# Patient Record
Sex: Female | Born: 1960
Health system: Southern US, Community
[De-identification: ages and names within clinical notes are randomized; demographics above are authoritative.]

## PROBLEM LIST (undated history)

## (undated) DIAGNOSIS — I10 Essential (primary) hypertension: Secondary | ICD-10-CM

## (undated) DIAGNOSIS — E039 Hypothyroidism, unspecified: Secondary | ICD-10-CM

## (undated) DIAGNOSIS — E119 Type 2 diabetes mellitus without complications: Secondary | ICD-10-CM

## (undated) DIAGNOSIS — H9190 Unspecified hearing loss, unspecified ear: Secondary | ICD-10-CM

## (undated) DIAGNOSIS — F32A Depression, unspecified: Secondary | ICD-10-CM

## (undated) DIAGNOSIS — F419 Anxiety disorder, unspecified: Secondary | ICD-10-CM

## (undated) DIAGNOSIS — E785 Hyperlipidemia, unspecified: Secondary | ICD-10-CM

## (undated) DIAGNOSIS — F329 Major depressive disorder, single episode, unspecified: Secondary | ICD-10-CM

## (undated) DIAGNOSIS — K3184 Gastroparesis: Secondary | ICD-10-CM

## (undated) HISTORY — DX: Major depressive disorder, single episode, unspecified: F32.9

## (undated) HISTORY — DX: Hyperlipidemia, unspecified: E78.5

## (undated) HISTORY — DX: Depression, unspecified: F32.A

## (undated) HISTORY — PX: ABDOMINAL HYSTERECTOMY: SUR658

## (undated) HISTORY — DX: Essential (primary) hypertension: I10

## (undated) HISTORY — PX: BUNIONECTOMY: SHX129

## (undated) HISTORY — DX: Hypothyroidism, unspecified: E03.9

## (undated) HISTORY — DX: Anxiety disorder, unspecified: F41.9

## (undated) HISTORY — DX: Gastroparesis: K31.84

## (undated) HISTORY — DX: Unspecified hearing loss, unspecified ear: H91.90

## (undated) HISTORY — DX: Type 2 diabetes mellitus without complications: E11.9

---

## 1997-08-23 ENCOUNTER — Encounter: Admission: RE | Admit: 1997-08-23 | Discharge: 1997-11-21 | Payer: Self-pay | Admitting: Internal Medicine

## 2000-02-14 ENCOUNTER — Encounter (HOSPITAL_BASED_OUTPATIENT_CLINIC_OR_DEPARTMENT_OTHER): Payer: Self-pay | Admitting: Internal Medicine

## 2000-02-14 ENCOUNTER — Encounter: Admission: RE | Admit: 2000-02-14 | Discharge: 2000-02-14 | Payer: Self-pay | Admitting: Internal Medicine

## 2001-02-17 ENCOUNTER — Encounter: Payer: Self-pay | Admitting: Internal Medicine

## 2001-02-17 ENCOUNTER — Encounter: Admission: RE | Admit: 2001-02-17 | Discharge: 2001-02-17 | Payer: Self-pay | Admitting: Internal Medicine

## 2001-05-18 ENCOUNTER — Encounter: Payer: Self-pay | Admitting: Internal Medicine

## 2001-05-18 ENCOUNTER — Ambulatory Visit (HOSPITAL_COMMUNITY): Admission: RE | Admit: 2001-05-18 | Discharge: 2001-05-18 | Payer: Self-pay | Admitting: Internal Medicine

## 2001-09-24 ENCOUNTER — Encounter: Payer: Self-pay | Admitting: Internal Medicine

## 2001-09-24 ENCOUNTER — Encounter: Admission: RE | Admit: 2001-09-24 | Discharge: 2001-09-24 | Payer: Self-pay | Admitting: Internal Medicine

## 2002-03-03 ENCOUNTER — Encounter: Admission: RE | Admit: 2002-03-03 | Discharge: 2002-03-03 | Payer: Self-pay | Admitting: Internal Medicine

## 2002-03-03 ENCOUNTER — Encounter: Payer: Self-pay | Admitting: Internal Medicine

## 2003-01-02 ENCOUNTER — Inpatient Hospital Stay (HOSPITAL_COMMUNITY): Admission: EM | Admit: 2003-01-02 | Discharge: 2003-01-05 | Payer: Self-pay | Admitting: Psychiatry

## 2003-02-25 ENCOUNTER — Emergency Department (HOSPITAL_COMMUNITY): Admission: EM | Admit: 2003-02-25 | Discharge: 2003-02-26 | Payer: Self-pay | Admitting: Emergency Medicine

## 2003-03-28 ENCOUNTER — Ambulatory Visit (HOSPITAL_COMMUNITY): Admission: RE | Admit: 2003-03-28 | Discharge: 2003-03-28 | Payer: Self-pay | Admitting: Internal Medicine

## 2003-12-30 ENCOUNTER — Encounter: Admission: RE | Admit: 2003-12-30 | Discharge: 2003-12-30 | Payer: Self-pay | Admitting: Internal Medicine

## 2004-03-22 ENCOUNTER — Ambulatory Visit (HOSPITAL_COMMUNITY): Admission: RE | Admit: 2004-03-22 | Discharge: 2004-03-22 | Payer: Self-pay | Admitting: Internal Medicine

## 2005-02-26 ENCOUNTER — Encounter: Admission: RE | Admit: 2005-02-26 | Discharge: 2005-02-26 | Payer: Self-pay | Admitting: Internal Medicine

## 2006-02-28 ENCOUNTER — Encounter: Admission: RE | Admit: 2006-02-28 | Discharge: 2006-02-28 | Payer: Self-pay | Admitting: Internal Medicine

## 2006-08-20 ENCOUNTER — Ambulatory Visit (HOSPITAL_COMMUNITY): Payer: Self-pay | Admitting: Psychiatry

## 2006-10-01 ENCOUNTER — Ambulatory Visit (HOSPITAL_COMMUNITY): Payer: Self-pay | Admitting: Psychiatry

## 2006-12-10 ENCOUNTER — Ambulatory Visit (HOSPITAL_COMMUNITY): Payer: Self-pay | Admitting: Psychiatry

## 2007-01-28 ENCOUNTER — Ambulatory Visit (HOSPITAL_COMMUNITY): Payer: Self-pay | Admitting: Psychiatry

## 2007-04-08 ENCOUNTER — Ambulatory Visit (HOSPITAL_COMMUNITY): Payer: Self-pay | Admitting: Psychiatry

## 2007-04-30 ENCOUNTER — Encounter: Admission: RE | Admit: 2007-04-30 | Discharge: 2007-04-30 | Payer: Self-pay | Admitting: Internal Medicine

## 2007-07-22 ENCOUNTER — Ambulatory Visit (HOSPITAL_COMMUNITY): Payer: Self-pay | Admitting: Psychiatry

## 2007-09-16 ENCOUNTER — Ambulatory Visit (HOSPITAL_COMMUNITY): Payer: Self-pay | Admitting: Psychiatry

## 2007-10-21 ENCOUNTER — Ambulatory Visit (HOSPITAL_COMMUNITY): Payer: Self-pay | Admitting: Psychiatry

## 2007-11-18 ENCOUNTER — Ambulatory Visit (HOSPITAL_COMMUNITY): Payer: Self-pay | Admitting: Psychiatry

## 2007-12-16 ENCOUNTER — Ambulatory Visit (HOSPITAL_COMMUNITY): Payer: Self-pay | Admitting: Psychiatry

## 2008-02-10 ENCOUNTER — Ambulatory Visit (HOSPITAL_COMMUNITY): Payer: Self-pay | Admitting: Psychiatry

## 2008-04-06 ENCOUNTER — Ambulatory Visit (HOSPITAL_COMMUNITY): Payer: Self-pay | Admitting: Psychiatry

## 2008-05-18 ENCOUNTER — Ambulatory Visit (HOSPITAL_COMMUNITY): Payer: Self-pay | Admitting: Psychiatry

## 2008-07-06 DIAGNOSIS — E039 Hypothyroidism, unspecified: Secondary | ICD-10-CM | POA: Insufficient documentation

## 2008-07-06 DIAGNOSIS — F321 Major depressive disorder, single episode, moderate: Secondary | ICD-10-CM | POA: Insufficient documentation

## 2008-07-06 DIAGNOSIS — H9190 Unspecified hearing loss, unspecified ear: Secondary | ICD-10-CM | POA: Insufficient documentation

## 2008-07-06 DIAGNOSIS — F200 Paranoid schizophrenia: Secondary | ICD-10-CM | POA: Insufficient documentation

## 2008-07-11 ENCOUNTER — Ambulatory Visit: Payer: Self-pay | Admitting: Gastroenterology

## 2008-07-11 DIAGNOSIS — R195 Other fecal abnormalities: Secondary | ICD-10-CM | POA: Insufficient documentation

## 2008-07-11 LAB — CONVERTED CEMR LAB
Calcium: 9 mg/dL (ref 8.4–10.5)
Eosinophils Absolute: 0.3 10*3/uL (ref 0.0–0.7)
GFR calc Af Amer: 115 mL/min
GFR calc non Af Amer: 95 mL/min
Glucose, Bld: 108 mg/dL — ABNORMAL HIGH (ref 70–99)
HCT: 42.1 % (ref 36.0–46.0)
Hemoglobin: 14.9 g/dL (ref 12.0–15.0)
MCHC: 35.4 g/dL (ref 30.0–36.0)
MCV: 88.4 fL (ref 78.0–100.0)
Monocytes Absolute: 0.8 10*3/uL (ref 0.1–1.0)
Monocytes Relative: 6.7 % (ref 3.0–12.0)
Neutro Abs: 7.5 10*3/uL (ref 1.4–7.7)
Platelets: 230 10*3/uL (ref 150–400)
RDW: 11.6 % (ref 11.5–14.6)
Sodium: 141 meq/L (ref 135–145)

## 2008-08-16 ENCOUNTER — Ambulatory Visit: Payer: Self-pay | Admitting: Gastroenterology

## 2008-08-16 DIAGNOSIS — K589 Irritable bowel syndrome without diarrhea: Secondary | ICD-10-CM | POA: Insufficient documentation

## 2008-08-16 DIAGNOSIS — K644 Residual hemorrhoidal skin tags: Secondary | ICD-10-CM | POA: Insufficient documentation

## 2008-08-23 ENCOUNTER — Ambulatory Visit: Payer: Self-pay | Admitting: Gastroenterology

## 2008-09-04 ENCOUNTER — Encounter: Payer: Self-pay | Admitting: Gastroenterology

## 2008-09-14 ENCOUNTER — Ambulatory Visit (HOSPITAL_COMMUNITY): Payer: Self-pay | Admitting: Psychiatry

## 2008-09-27 ENCOUNTER — Ambulatory Visit: Payer: Self-pay | Admitting: Gastroenterology

## 2008-09-27 DIAGNOSIS — K648 Other hemorrhoids: Secondary | ICD-10-CM | POA: Insufficient documentation

## 2008-10-11 ENCOUNTER — Ambulatory Visit (HOSPITAL_COMMUNITY): Admission: RE | Admit: 2008-10-11 | Discharge: 2008-10-11 | Payer: Self-pay | Admitting: Gastroenterology

## 2008-10-11 ENCOUNTER — Ambulatory Visit: Payer: Self-pay | Admitting: Gastroenterology

## 2008-10-19 ENCOUNTER — Ambulatory Visit (HOSPITAL_COMMUNITY): Payer: Self-pay | Admitting: Psychiatry

## 2008-11-30 ENCOUNTER — Ambulatory Visit (HOSPITAL_COMMUNITY): Payer: Self-pay | Admitting: Psychiatry

## 2009-01-04 ENCOUNTER — Ambulatory Visit (HOSPITAL_COMMUNITY): Payer: Self-pay | Admitting: Psychiatry

## 2009-02-21 ENCOUNTER — Encounter: Admission: RE | Admit: 2009-02-21 | Discharge: 2009-02-21 | Payer: Self-pay | Admitting: Internal Medicine

## 2009-05-17 ENCOUNTER — Ambulatory Visit (HOSPITAL_COMMUNITY): Payer: Self-pay | Admitting: Psychiatry

## 2009-05-31 ENCOUNTER — Ambulatory Visit (HOSPITAL_COMMUNITY): Payer: Self-pay | Admitting: Psychiatry

## 2009-08-02 ENCOUNTER — Ambulatory Visit (HOSPITAL_COMMUNITY): Payer: Self-pay | Admitting: Psychiatry

## 2009-09-06 ENCOUNTER — Ambulatory Visit (HOSPITAL_COMMUNITY): Payer: Self-pay | Admitting: Psychiatry

## 2009-09-27 ENCOUNTER — Ambulatory Visit (HOSPITAL_COMMUNITY): Payer: Self-pay | Admitting: Psychiatry

## 2009-10-23 ENCOUNTER — Ambulatory Visit: Payer: Self-pay | Admitting: Psychiatry

## 2009-11-01 ENCOUNTER — Ambulatory Visit (HOSPITAL_COMMUNITY): Payer: Self-pay | Admitting: Psychiatry

## 2010-02-20 ENCOUNTER — Ambulatory Visit: Payer: Self-pay | Admitting: Gastroenterology

## 2010-02-26 ENCOUNTER — Encounter: Admission: RE | Admit: 2010-02-26 | Discharge: 2010-02-26 | Payer: Self-pay | Admitting: Internal Medicine

## 2010-02-28 ENCOUNTER — Ambulatory Visit: Payer: Self-pay | Admitting: Gastroenterology

## 2010-03-01 ENCOUNTER — Telehealth: Payer: Self-pay | Admitting: Gastroenterology

## 2010-03-14 ENCOUNTER — Ambulatory Visit (HOSPITAL_COMMUNITY): Admission: RE | Admit: 2010-03-14 | Discharge: 2010-03-14 | Payer: Self-pay | Admitting: Gastroenterology

## 2010-03-15 ENCOUNTER — Encounter: Payer: Self-pay | Admitting: Gastroenterology

## 2010-03-15 ENCOUNTER — Telehealth: Payer: Self-pay | Admitting: Gastroenterology

## 2010-04-25 ENCOUNTER — Ambulatory Visit: Payer: Self-pay | Admitting: Gastroenterology

## 2010-04-25 DIAGNOSIS — K3184 Gastroparesis: Secondary | ICD-10-CM | POA: Insufficient documentation

## 2010-05-31 ENCOUNTER — Telehealth: Payer: Self-pay | Admitting: Gastroenterology

## 2010-05-31 ENCOUNTER — Encounter: Payer: Self-pay | Admitting: Gastroenterology

## 2010-06-01 ENCOUNTER — Ambulatory Visit
Admission: RE | Admit: 2010-06-01 | Discharge: 2010-06-01 | Payer: Self-pay | Source: Home / Self Care | Attending: Gastroenterology | Admitting: Gastroenterology

## 2010-06-05 ENCOUNTER — Encounter: Payer: Self-pay | Admitting: Gastroenterology

## 2010-06-06 ENCOUNTER — Telehealth: Payer: Self-pay | Admitting: Gastroenterology

## 2010-06-12 NOTE — Progress Notes (Signed)
Summary: Med sent  Phone Note Outgoing Call Call back at Home Phone [phone]   Action Taken: Appt scheduled Summary of Call: Called pt to inform her of her GES and that we was sending in a new prescription for her. ALso made her follow up appointment with Dr Arlyce Dice on 04/25/2010 at 11am Initial call taken by: Merri Ray CMA Duncan Dull),  March 15, 2010 10:56 AM    New/Updated Medications: REGLAN 10 MG TABS (METOCLOPRAMIDE HCL) 1 po  30 minutes before meals and at bedtime Prescriptions: REGLAN 10 MG TABS (METOCLOPRAMIDE HCL) 1 po  30 minutes before meals and at bedtime  #120 x 1   Entered by:   Merri Ray CMA (AAMA)   Authorized by:   Louis Meckel MD   Signed by:   Merri Ray CMA (AAMA) on 03/15/2010   Method used:   [facility]        [facility]  [address] [phone][facility] ([facility]ail[facility]       [address]       [place], [place]  [address]       Ph: [phone]       Fax: [phone]   RxID:   [id number]

## 2010-06-12 NOTE — Letter (Signed)
Summary: Diabetic Instructions  [facility]  [address] [place], [place] [address]   Phone: [phone]  Fax: [phone]    [facility] [place] 07-10-1960 MRN: [id number]   X    ORAL DIABETIC MEDICATION INSTRUCTIONS  The day before your procedure:   Take your diabetic pill as you do normally  The day of your procedure:   Do not take your diabetic pill    We will check your blood sugar levels during the admission process and again in Recovery before discharging you home  ________________________________________________________________________  _  _   INSULIN (LONG ACTING) MEDICATION INSTRUCTIONS (Lantus, NPH, 70/30, Humulin, Novolin-N)   The day before your procedure:   Take  your regular evening dose    The day of your procedure:   Do not take your morning dose    _  _   INSULIN (SHORT ACTING) MEDICATION INSTRUCTIONS (Regular, Humulog, Novolog)   The day before your procedure:   Do not take your evening dose   The day of your procedure:   Do not take your morning dose   _  _   INSULIN PUMP MEDICATION INSTRUCTIONS  We will contact the physician managing your diabetic care for written dosage instructions for the day before your procedure and the day of your procedure.  Once we have received the instructions, we will contact you.

## 2010-06-12 NOTE — Progress Notes (Signed)
Summary: GES SCHEDULED  Phone Note Outgoing Call Call back at [facility] Phone [phone]   Call placed by: Merri Ray CMA Duncan Dull),  March 01, 2010 8:38 AM Action Taken: Appt scheduled Summary of Call: called pt to inform GES was scheduled for 03/14/2010 at 10:00am. Will mail the pt instructions today Initial call taken by: Merri Ray CMA Duncan Dull),  March 01, 2010 8:42 AM

## 2010-06-12 NOTE — Procedures (Signed)
Summary: Upper Endoscopy  Patient: Heidi Good Note: All result statuses are Final unless otherwise noted.  Tests: (1) Upper Endoscopy (EGD)   EGD Upper Endoscopy       DONE     [facility]     [address]     [place], [place]  [address]           ENDOSCOPY PROCEDURE REPORT           PATIENT:  Heidi Good, Heidi Good  MR#:  [id number]     BIRTHDATE:  01-12-1961, 49 yrs. old  GENDER:  female           ENDOSCOPIST:  Barbette Hair. Arlyce Dice, MD     Referred by:           PROCEDURE DATE:  02/28/2010     PROCEDURE:  EGD, diagnostic     ASA CLASS:  Class II     INDICATIONS:  nausea           MEDICATIONS:   Fentanyl 75 mcg IV, Versed 7 mg IV, glycopyrrolate     (Robinal) 0.2 mg IV, 0.6cc simethancone 0.6 cc PO     TOPICAL ANESTHETIC:  Exactacain Spray           DESCRIPTION OF PROCEDURE:   After the risks benefits and     alternatives of the procedure were thoroughly explained, informed     consent was obtained.  The LB GIF-H180 [id number] endoscope was     introduced through the mouth and advanced to the third portion of     the duodenum, without limitations.  The instrument was slowly     withdrawn as the mucosa was fully examined.     <<PROCEDUREIMAGES>>           The upper, middle, and distal third of the esophagus were     carefully inspected and no abnormalities were noted. The z-line     was well seen at the GEJ. The endoscope was pushed into the fundus     which was normal including a retroflexed view. The antrum,gastric     body, first and second part of the duodenum were unremarkable (see     image1, image2, image3, image4, image5, image6, and image7).     Retroflexed views revealed no abnormalities.    The scope was then     withdrawn from the patient and the procedure completed.     COMPLICATIONS:  None           ENDOSCOPIC IMPRESSION:     1) Normal EGD     RECOMMENDATIONS:     1) My office will arrange for you to have a Gastric Emptying     Scan performed. This is a  radiology test that gives an idea of how     well your stomach functions.           REPEAT EXAM:  No           ______________________________     Barbette Hair. Arlyce Dice, MD           CC:  Geoffry Paradise, MD           n.     Rosalie Doctor:   Barbette Hair. Aaryana Betke at 02/28/2010 03:40 PM           [place], [place], [phone]  Note: An exclamation mark (!) indicates a result that was not dispersed into the flowsheet. Document Creation Date: 02/28/2010 3:40 PM _______________________________________________________________________  (1) Order result  status: Final Collection or observation date-time: 02/28/2010 15:36 Requested date-time:  Receipt date-time:  Reported date-time:  Referring Physician:   Ordering Physician: Melvia Heaps [id number]) Specimen Source:  Source: Launa Grill Order Number: [id number] Lab site:   Appended Document: Upper Endoscopy See phone note  Appended Document: Upper Endoscopy GES SCHEDULED FOR 11/2

## 2010-06-12 NOTE — Assessment & Plan Note (Signed)
Summary: vomiting,diarrhea...as.   History of Present Illness Visit Type: Follow-up Visit Primary GI MD: Melvia Heaps MD [facility] Primary Provider: Geoffry Paradise, MD Requesting Provider: Geoffry Paradise, MD Chief Complaint: Patient c/o 2 months diarrhea after eating as well as daily vomiting. She denies any abdominal pain but does have some chest burning. History of Present Illness:   Ms. Burchfield has returned for evaluation of nausea and diarrhea.  For the past 2 months she has been complaining of  frequent nausea that is worse postprandially.  She vomits 1- 2 times a day.  She also has had frequent diarrhea consisting of at least 3-4 bowel movements a day.  This occurs with urgency and she may awaken to move her bowels.  She denies melena or hematochezia.  There have been no medication changes and she has not been on antibiotics.  She is a non-insulin-dependent diabetic.  Colonoscopy in June, 2010 demonstrated hemorrhoids and diverticula.  She is status post band ligation of her hemorrhoids.  Her weight has been stable.   GI Review of Systems    Reports acid reflux, belching, bloating, chest pain, loss of appetite, nausea, and  vomiting.      Denies abdominal pain, dysphagia with liquids, dysphagia with solids, heartburn, vomiting blood, weight loss, and  weight gain.      Reports change in bowel habits and  diarrhea.    Preventive Screening-Counseling & Management  Caffeine-Diet-Exercise     Does Patient Exercise: yes    Current Medications (verified): 1)  Xanax 0.5 Mg Tabs (Alprazolam) .... Take 1 Tablet By Mouth Two Times A Day As Needed 2)  Simvastatin 20 Mg Tabs (Simvastatin) .... Once Daily 3)  Synthroid 75 Mcg Tabs (Levothyroxine Sodium) .... Take 1 Tablet By Mouth Once A Day 4)  Glucophage 500 Mg Tabs (Metformin Hcl) .... Take 3 Tabs Every Morning 5)  Zoloft 50 Mg Tabs (Sertraline Hcl) .... Once Daily 6)  Abilify 15 Mg Tabs (Aripiprazole) .... At Bedtime 7)  Estradiol 1 Mg  Tabs (Estradiol) .... Take 1 Tablet By Mouth Once A Day 8)  Allergy Shot .... Once Weekly  Allergies (verified): No Known Drug Allergies  Past History:  Past Medical History: Current Problems:  DIABETES MELLITUS, TYPE II, HX OF (ICD-V12.2) HYPOTHYROIDISM (ICD-244.9) DEAFNESS (ICD-389.9) DEPRESSION (ICD-311) SCHIZOAFFECTIVE DISORDER (ICD-295.70) Asthma Diarrhea Vomiting  Past Surgical History: Hysterectomy Left foot surgery  Family History: Family History of Heart Disease: MGF, deceased MI, MGM Family History of Diabetes: MGF  Family History of Liver Cancer:Maternal Grandmother Family History of Colon Cancer: Family History of Colon Polyps: Mother  Social History: Occupation: Retired Research officer, 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  Alcohol Use - no Illicit Drug Use - no Patient is deaf Patient gets regular exercise. Does Patient Exercise:  yes  Review of Systems       The patient complains of fatigue, hearing problems, and sleeping problems.  The patient denies allergy/sinus, anemia, anxiety-new, arthritis/joint pain, back pain, blood in urine, breast changes/lumps, change in vision, confusion, cough, coughing up blood, depression-new, fainting, fever, headaches-new, heart murmur, heart rhythm changes, itching, menstrual pain, muscle pains/cramps, night sweats, nosebleeds, pregnancy symptoms, shortness of breath, skin rash, sore throat, swelling of feet/legs, swollen lymph glands, thirst - excessive , urination - excessive , urination changes/pain, urine leakage, vision changes, and voice change.         All other systems were reviewed and were negative   Vital Signs:  Patient profile:   50 year old female Height:  65 inches Weight:      191.50 pounds BMI:     31.98 BSA:     1.94 Pulse rate:   72 / minute Pulse rhythm:   regular BP sitting:   150 / 90  (left arm)  Vitals Entered By: Lamona Curl CMA Duncan Dull) (February 20, 2010 2:32 PM)  Physical  Exam  Additional Exam:  She is well-developed well-nourished female  skin: anicteric HEENT: normocephalic; PEERLA; no nasal or pharyngeal abnormalities neck: supple nodes: no cervical lymphadenopathy chest: clear to ausculatation and percussion heart: no murmurs, gallops, or rubs abd: soft, nontender; BS normoactive; no abdominal masses, tenderness, organomegaly rectal: deferred ext: no cynanosis, clubbing, edema skeletal: no deformities neuro: oriented x 3; no focal abnormalities    Impression & Recommendations:  Problem # 1:  NAUSEA ALONE (ICD-787.02)  Symptoms could be due to ulcer or nonulcer dyspepsia.  Gastroparesis is also a concern.  Recommendations #1 begin AcipHex 20 mg daily #2 upper endoscopy #3 gastric emptying scan if endoscopy is negative  Orders: EGD (EGD)  Problem # 2:  DIARRHEA (ICD-787.91) New-onset diarrhea of 2 months' duration could be related to bacterial overgrowth secondary to diabetes.  IBS is also a possibility although her symptoms are somewhat prolonged and chronic.  Prior enteric infection is less likely as is pseudomembranous colitis  Medications #1 empiric therapy with ampicillin 500 mg q.i.d. for one week  Problem # 3:  INTERNAL HEMORRHOIDS WITHOUT MENTION COMP (ICD-455.0) Assessment: Improved  Problem # 4:  DIABETES MELLITUS, TYPE II, HX OF (ICD-V12.2) Assessment: Comment Only  Patient Instructions: 1)  Copy sent to : Geoffry Paradise, MD 2)  Your EGD is scheduled for 02/28/2010 at 3pm 3)  You will need to bring your father with you to this appointment. 4)  We are giving you Aciphex samples today 5)  The medication list was reviewed and reconciled.  All changed / newly prescribed medications were explained.  A complete medication list was provided to the patient / caregiver. Prescriptions: AMPICILLIN 250 MG CAPS (AMPICILLIN) take one tablet q.i.d.  #30 x 0   Entered and Authorized by:   Louis Meckel MD   Signed by:   Louis Meckel MD on 02/21/2010   Method used:   Electronically to        [facility] (retail)       [address]       [place], [place]  [id number]       Ph: [phone]       Fax: [phone]   RxID:   [id number]

## 2010-06-12 NOTE — Letter (Signed)
Summary: Appt Reminder 2  [facility]  [address] [place], [place] [address]   Phone: [phone]  Fax: [phone]        March 15, 2010 MRN: [id number]    [facility] [place] [address] [place], [place]  [address]    Dear [name],   You have a return appointment with Dr. Arlyce Dice on 04/25/2010 at 11am.  Please remember to bring a complete list of the medicines you are taking, your insurance card and your co-pay.  If you have to cancel or reschedule this appointment, please call before 5:00 pm the evening before to avoid a cancellation fee.  If you have any questions or concerns, please call [phone].    Sincerely,    Merri Ray CMA (AAMA)

## 2010-06-12 NOTE — Letter (Signed)
Summary: EGD Instructions  [facility]  [address] [place], [place] [address]   Phone: [phone]  Fax: [phone]       [facility] [place]    May 14, 1960    MRN: [id number]       Procedure Day /Date:WEDNESDAY 02/28/2010     Arrival Time: 2PM     Procedure Time:3PM     Location of Procedure:                    X  [facility] (4th Floor)   PREPARATION FOR ENDOSCOPY   On 02/28/2010 THE DAY OF THE PROCEDURE:  1.   No solid foods, milk or milk products are allowed after midnight the night before your procedure.  2.   Do not drink anything colored red or purple.  Avoid juices with pulp.  No orange juice.  3.  You may drink clear liquids until1PM, which is 2 hours before your procedure.                                                                                                CLEAR LIQUIDS INCLUDE: Water Jello Ice Popsicles Tea (sugar ok, no milk/cream) Powdered fruit flavored drinks Coffee (sugar ok, no milk/cream) Gatorade Juice: apple, white grape, white cranberry  Lemonade Clear bullion, consomm, broth Carbonated beverages (any kind) Strained chicken noodle soup Hard Candy   MEDICATION INSTRUCTIONS  Unless otherwise instructed, you should take regular prescription medications with a small sip of water as early as possible the morning of your procedure.  Diabetic patients - see separate instructions.             OTHER INSTRUCTIONS  You will need a responsible adult at least 49 years of age to accompany you and drive you home.   This person must remain in the waiting room during your procedure.  Wear loose fitting clothing that is easily removed.  Leave jewelry and other valuables at home.  However, you may wish to bring a book to read or an iPod/MP3 player to listen to music as you wait for your procedure to start.  Remove all body piercing jewelry and leave at home.  Total time from sign-in until discharge is  approximately 2-3 hours.  You should go home directly after your procedure and rest.  You can resume normal activities the day after your procedure.  The day of your procedure you should not:   Drive   Make legal decisions   Operate machinery   Drink alcohol   Return to work  You will receive specific instructions about eating, activities and medications before you leave.    The above instructions have been reviewed and explained to me by   _______________________    I fully understand and can verbalize these instructions _____________________________ Date _________

## 2010-06-14 NOTE — Miscellaneous (Signed)
  Clinical Lists Changes Since pt is taking antibiotics, would not give ampicillin. Check stool for C dificile toxin  Appended Document:  Spoke with patients father and let him know we did not want her to take another antibiotic since she is already on one for a sinus infection. Let him know to bring her in tomorrow to the lab for c diff toxin test on her stool. Instructed him that they will give them instructions on how to collect the sample and what to do with it. He verbalized understanding.

## 2010-06-14 NOTE — Assessment & Plan Note (Signed)
Summary: F/U FROM GES,STARTING REGLAN-RS   History of Present Illness Visit Type: Follow-up Visit Primary GI MD: Melvia Heaps MD [facility] Primary Provider: Geoffry Paradise, MD Requesting Provider: n/a Chief Complaint: F/U from GES and starting reglan, pt has been taking her mothers carafate and Amitiza which has really helped her. Wants samples and prescription of each medication History of Present Illness:   Heidi Good has returned for followup of her bloating, nausea and diarrhea.  Diarrhea has entirely subsided following therapy with ampicillin.  She is now complaining of constipation and has taken her mother's amitiza with significant improvement.  Upper endoscopy was normal.  Gastric emptying scan demonstrated gastroparesis and she was started on Reglan with excellent results.  Upper GI complaints have resolved.   GI Review of Systems      Denies abdominal pain, acid reflux, belching, bloating, chest pain, dysphagia with liquids, dysphagia with solids, heartburn, loss of appetite, nausea, vomiting, vomiting blood, weight loss, and  weight gain.        Denies anal fissure, black tarry stools, change in bowel habit, constipation, diarrhea, diverticulosis, fecal incontinence, heme positive stool, hemorrhoids, irritable bowel syndrome, jaundice, light color stool, liver problems, rectal bleeding, and  rectal pain.    Current Medications (verified): 1)  Xanax 0.5 Mg Tabs (Alprazolam) .... Take 1 Tablet By Mouth Two Times A Day As Needed 2)  Simvastatin 20 Mg Tabs (Simvastatin) .... Once Daily 3)  Synthroid 75 Mcg Tabs (Levothyroxine Sodium) .... Take 1 Tablet By Mouth Once A Day 4)  Abilify 15 Mg Tabs (Aripiprazole) .... At Bedtime 5)  Estradiol 1 Mg Tabs (Estradiol) .... Take 1 Tablet By Mouth Once A Day 6)  Allergy Shot .... Once Weekly 7)  Ampicillin 250 Mg Caps (Ampicillin) .... Take One Tablet Q.i.d. 8)  Reglan 10 Mg Tabs (Metoclopramide Hcl) .Marland Kitchen.. 1 Po  30 Minutes Before Meals and At  Bedtime 9)  Carafate 1 Gm Tabs (Sucralfate) .... 2 Tsp By Mouth Two Times A Day As Needed 10)  Amitiza 24 Mcg Caps (Lubiprostone) .Marland Kitchen.. 1-2 By Mouth Once Daily  Allergies (verified): No Known Drug Allergies  Past History:  Past Medical History: Last updated: 02/20/2010 Current Problems:  DIABETES MELLITUS, TYPE II, HX OF (ICD-V12.2) HYPOTHYROIDISM (ICD-244.9) DEAFNESS (ICD-389.9) DEPRESSION (ICD-311) SCHIZOAFFECTIVE DISORDER (ICD-295.70) Asthma Diarrhea Vomiting  Past Surgical History: Last updated: 02/20/2010 Hysterectomy Left foot surgery  Family History: Last updated: 02/20/2010 Family History of Heart Disease: MGF, deceased MI, MGM Family History of Diabetes: MGF  Family History of Liver Cancer:Maternal Grandmother Family History of Colon Cancer: Family History of Colon Polyps: Mother  Social History: Last updated: 02/20/2010 Occupation: Retired Research officer, 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  Alcohol Use - no Illicit Drug Use - no Patient is deaf Patient gets regular exercise.  Review of Systems  The patient denies allergy/sinus, anemia, anxiety-new, arthritis/joint pain, back pain, blood in urine, breast changes/lumps, change in vision, confusion, cough, coughing up blood, depression-new, fainting, fatigue, fever, headaches-new, hearing problems, heart murmur, heart rhythm changes, itching, menstrual pain, muscle pains/cramps, night sweats, nosebleeds, pregnancy symptoms, shortness of breath, skin rash, sleeping problems, sore throat, swelling of feet/legs, swollen lymph glands, thirst - excessive , urination - excessive , urination changes/pain, urine leakage, vision changes, and voice change.    Vital Signs:  Patient profile:   50 year old female Height:      65 inches Weight:      197 pounds BMI:     32.90 BSA:     1.97  Pulse rate:   76 / minute Pulse rhythm:   regular BP sitting:   100 / 70  (left arm)  Vitals Entered By: Merri Ray CMA Duncan Dull)  (April 25, 2010 11:10 AM)   Impression & Recommendations:  Problem # 1:  GASTROPARESIS (ICD-536.3) Plan to continue reglan for about 3 months and then reevaluate.  Problem # 2:  DIARRHEA (ICD-787.91) Assessment: Improved This is probably due to bacterial overgrowth.  Recommendations #1 I carefully advised the patient to discontinue amitiza if she begins to to develop diarrhea again.  At that time I will consider repeat antibiotic therapy  Problem # 3:  DIABETES MELLITUS, TYPE II, HX OF (ICD-V12.2) Assessment: Comment Only  Patient Instructions: 1)  Copy sent to : Geoffry Paradise, MD 2)  3 month follow up 3)  The medication list was reviewed and reconciled.  All changed / newly prescribed medications were explained.  A complete medication list was provided to the patient / caregiver.

## 2010-06-14 NOTE — Progress Notes (Signed)
Summary: Triage  Phone Note Call from Patient Call back at Home Phone [phone]   Caller: Patient Call For: Dr. Arlyce Dice Reason for Call: Talk to Nurse Summary of Call: Pt has had diarrhea for past 3 days, would like to speak with nurse Initial call taken by: [place] Johnson,  May 31, 2010 11:03 AM  Follow-up for Phone Call        Spoke with patients mother and she states the patient has had diarrhea for the past 3 days. She is taking Reglan. Is not currently taking Amitiza. Dr. Arlyce Dice please advise. Follow-up by: Selinda Michaels RN,  May 31, 2010 11:50 AM  Additional Follow-up for Phone Call Additional follow up Details #1::        needs ampicillin 250mg  qid x 7 days c/b by Mon if not better Additional Follow-up by: Louis Meckel MD,  May 31, 2010 1:50 PM    Additional Follow-up for Phone Call Additional follow up Details #2::    Spoke with patients mother and let her know to start the Ampicillin and to call us back on Monday if patient no better. Follow-up by: Selinda Michaels RN,  May 31, 2010 2:25 PM  New/Updated Medications: AMPICILLIN 250 MG CAPS (AMPICILLIN) Take 1 by mouth four times a day Prescriptions: AMPICILLIN 250 MG CAPS (AMPICILLIN) Take 1 by mouth four times a day  #28 x 0   Entered by:   Selinda Michaels RN   Authorized by:   Louis Meckel MD   Signed by:   Selinda Michaels RN on 05/31/2010   Method used:   [facility]  [address] [phone][facility] ([facility]ail[facility]       [address]       [place], [place]  [address]       Ph: [phone]       Fax: [phone]   RxID:   [id number]   Appended Document: Triage Patients mother called back and states that she forgot to tell us that her daughter is taking Cefdinir 300mg  two times a day. She started taking this on Monday. Has had diarrhea now for 3 days. Informed patients mother that the Cefdinir may be causing the diarrhea. Dr. Arlyce Dice please advise.

## 2010-06-14 NOTE — Progress Notes (Signed)
Summary: results  Phone Note Call from Patient Call back at Home Phone [phone]   Caller: Heidi Good Call For: Dr Reason for Call: Talk to Nurse Summary of Call: mmother would like daughters lab results Initial call taken by: Tawni Levy,  June 06, 2010 2:37 PM  Follow-up for Phone Call        Spoke with patients mother and let her know lab results. Mother states that patient is still having diarrhea. She is still taking the antibiotic for her sinus infection and mother thinks she has about 5 more days on this. Mother wanted Dr. Arlyce Dice to be aware. Follow-up by: Selinda Michaels RN,  June 06, 2010 2:50 PM  Additional Follow-up for Phone Call Additional follow up Details #1::        Begin florostor 1 tab once daily for 10 days Additional Follow-up by: Louis Meckel MD,  June 06, 2010 3:19 PM    Additional Follow-up for Phone Call Additional follow up Details #2::    Mother aware of Dr. Marzetta Board recommendations. Follow-up by: Selinda Michaels RN,  June 06, 2010 4:26 PM  New/Updated Medications: * FLOROSTOR Take one tablet daily for 10 days Prescriptions: FLOROSTOR Take one tablet daily for 10 days  #10 x 0   Entered by:   Selinda Michaels RN   Authorized by:   Louis Meckel MD   Signed by:   Selinda Michaels RN on 06/06/2010   Method used:   Telephoned to ...       [facility]  [address] [id number]       [address]       [place], [place]  [address]       Ph: [phone]       Fax: [phone]   RxID:   [id number]

## 2010-07-25 LAB — GLUCOSE, CAPILLARY: Glucose-Capillary: 95 mg/dL (ref 70–99)

## 2010-07-27 ENCOUNTER — Encounter: Payer: Self-pay | Admitting: Gastroenterology

## 2010-07-31 NOTE — Letter (Signed)
Summary: Office Visit Letter  [facility]  [address] [place], [place] [address]   Phone: [phone]  Fax: [phone]      July 27, 2010 MRN: [id number]   [facility] [place] [address] [place], [place]  [address]   Dear Ms. Orser,   According to our records, it is time for you to schedule a follow-up office visit with [place].   At your convenience, please call [phone] (option #2)to schedule an office visit. If you have any questions, concerns, or feel that this letter is in error, we would appreciate your call.   Sincerely,  Barbette Hair. Arlyce Dice, M.D.   [facility] [phone]

## 2010-09-14 ENCOUNTER — Ambulatory Visit: Payer: Self-pay | Admitting: Gastroenterology

## 2010-09-24 ENCOUNTER — Ambulatory Visit (INDEPENDENT_AMBULATORY_CARE_PROVIDER_SITE_OTHER): Payer: Medicare Other | Admitting: Gastroenterology

## 2010-09-24 ENCOUNTER — Encounter: Payer: Self-pay | Admitting: Gastroenterology

## 2010-09-24 DIAGNOSIS — K5900 Constipation, unspecified: Secondary | ICD-10-CM

## 2010-09-24 DIAGNOSIS — K3184 Gastroparesis: Secondary | ICD-10-CM

## 2010-09-24 NOTE — Assessment & Plan Note (Addendum)
Symptoms of bloating and nausea seem to be more tied to her constipation than to gastroparesis.  Recommendations #1 discontinue Reglan

## 2010-09-24 NOTE — Progress Notes (Signed)
Ms. Overdorf has returned for followup of her gastroparesis and constipation. She develops nausea and crampy abdominal pain if she goes 4-5 days without a bowel movement. When she takes [place] she readily clears out of her system.  Without medications she may go 4-5 days without a bowel movement. She continues on Reglan.

## 2010-09-24 NOTE — Patient Instructions (Signed)
Cc. Heidi Good We are giving you samples of Amitiza today

## 2010-09-24 NOTE — Assessment & Plan Note (Addendum)
She has functional constipation that responds well to [place].  Plan to continue with the same.

## 2010-09-28 NOTE — Discharge Summary (Signed)
Heidi Good, Heidi Good                         ACCOUNT NO.:  [id number]   MEDICAL RECORD NO.:  [id number]                   PATIENT TYPE:  IPS   LOCATION:  0302                                 FACILITY:  [facility]   PHYSICIAN:  Geoffery Lyons, M.D.                   DATE OF BIRTH:  1960/10/12   DATE OF ADMISSION:  01/02/2003  DATE OF DISCHARGE:  01/05/2003                                 DISCHARGE SUMMARY   CHIEF COMPLAINT AND PRESENT ILLNESS:  This was the first admission to [facility] but one of previous hospitalizations to psychiatric  institution for this 50 year old divorced white female, brought by her  family.  Increased paranoia, hostility and mood liability.  Stopped her  Abilify and symptoms increased.  Denies every having stopped taking the  Abilify.  Acknowledges that her mind is racing, feeling more depressed,  lonely and bored.  Had a loss of motivation, overwhelmed, have to push  herself to have to do things, claimed that everyone is out to get her.   PAST PSYCHIATRIC HISTORY:  Initially diagnosed in 1994.  Currently in  outpatient treatment by Elna Breslow.   ALCOHOL/DRUG HISTORY:  There is no history of alcohol or drugs.   PAST MEDICAL HISTORY:  Hysterectomy-induced menopause, hypothyroid and  replace, type 2 diabetes mellitus.  She is deaf.   MENTAL STATUS EXAM:  Well-nourished, well-developed, alert, cooperative  female.  Deaf.  Signs.  Her mood was labile, especially when discussing her  mother's illness.  Thought processes are positive for paranoia, persecutory  ideas, somewhat grandiose.  Concentration and memory appeared to be intact.  Judgment and insight were fair.  Cognition well-preserved.   ADMISSION DIAGNOSES:   AXIS I:  Schizoaffective disorder, depressed.   AXIS II:  No diagnosis.   AXIS III:  1. Deafness.  2. Hypothyroidism.  3. Hypercholesterolemia.  4. Type 2 diabetes.   AXIS IV:  Moderate.   AXIS V:  Global Assessment of  Functioning upon admission 30-35; highest  Global Assessment of Functioning in the last year 70.   LABORATORY DATA:  Blood chemistry within normal limits.  Thyroid profile  within normal limits.   HOSPITAL COURSE:  She was admitted and started intensive individual and  group psychotherapy.  She was placed on the Xanax she was taking 0.5 mg four  times a day, Abilify 15 mg, Zoloft 50 mg per day, Lipitor, Glucophage 500  mg, 3 daily, Synthroid 0.075, Trileptal 150 mg twice a day and 300 mg at  night.  When she was placed back on her medications and she was given  support, she definitely settled down.  We used an interpreter that helped  her to attend groups and participate in individual and group psychotherapy.  She felt that her father had had a lot of changes in behavior towards her.  There were ideas of reference, some distortions.  Claimed side effects from  the Abilify.  Continued to settle down.  Was interacting with patients and  staff.  Still some underlying paranoia but she seemed to be challenging  that.  On January 05, 2003, she was wanting to be discharged.  She was in  full contact with reality.  Definitely mood was improved, affect was bright,  broad.  She denied any active ideas to hurt herself or others.  She felt  like she had to let go of the feelings towards the sister-in-law.  Was  willing to continue outpatient treatment.  On January 05, 2003, she was in  full contact with reality, so we went ahead and discharged to outpatient  follow-up.   DISCHARGE DIAGNOSES:   AXIS I:  Schizoaffective disorder.   AXIS II:  No diagnosis.   AXIS III:  1. Deafness.  2. Hypothyroidism.  3. Non-insulin-dependent diabetes mellitus.  4. Hypercholesterolemia.   AXIS IV:  Moderate.   AXIS V:  Global Assessment of Functioning upon discharge 55-60.   DISCHARGE MEDICATIONS:  1. Xanax 0.5 mg four times a day.  2. Lipitor 10 mg daily.  3. Synthroid 75 mcg daily.  4. Trileptal 150 mg  twice a day and 2 at night.  5. Ambien 10 mg at bedtime for sleep.  6. Glucophage 500 mg, 3 at night.  7. Zoloft 50 mg daily.  8. Abilify 15 mg at bedtime.   FOLLOW UP:  Dr. Elna Breslow.                                               Geoffery Lyons, M.D.    IL/MEDQ  D:  01/26/2003  T:  01/27/2003  Job:  [id number]

## 2010-09-28 NOTE — H&P (Signed)
Heidi Good, Heidi Good                         ACCOUNT NO.:  [id number]   MEDICAL RECORD NO.:  [id number]                   PATIENT TYPE:  IPS   LOCATION:  0302                                 FACILITY:  [facility]   PHYSICIAN:  Jeanice Lim, M.D.              DATE OF BIRTH:  01/06/1961   DATE OF ADMISSION:  01/02/2003  DATE OF DISCHARGE:                         PSYCHIATRIC ADMISSION ASSESSMENT   PATIENT IDENTIFICATION:  This is a voluntary admission.  She is admitted to  Jeanice Lim, M.D.  Identifying information comes from the patient, her  family members, and accompanying records.   HISTORY OF PRESENT ILLNESS:  This 50 year old white divorced female was  brought here by her family.  The family reports increasing paranoia,  hostility, and mood lability.  Family says the patient stopped her Abilify  and symptoms increased.  The patient denies ever having stopped taking the  Abilify.  The patient acknowledges that her mind is racing.  She is feeling  more depressed, lonely, and bored.  She did feel out a Burn's Depression  Scale where she only acknowledged guilt but she says that her family says  she is to blame for everything.  She did acknowledge having loss of  motivation.  She stated that she is overwhelmed and has to push herself hard  to do things.  She readily acknowledges that everyone is out to get her and  says that she can see the future kind of like C.S.I.  She denies any  problems with sleep.  She denies any problems with concentration and memory.   PAST PSYCHIATRIC HISTORY:  The patient was initially diagnosed with  schizophrenia in 1994.  She had three hospitalizations at this facility,  which, at that point in time, was [facility].  She is currently  followed as an outpatient by Heidi Good. Heidi Good, M.D.   SUBSTANCE ABUSE HISTORY:  Alcohol and drug history: She vehemently denies.   PAST MEDICAL HISTORY:  Primary care Heidi Good: Dr. Michail Good.  Medical  problems: She has a hysterectomy-induced menopause and is replaced with  Estradiol.  She is hypothyroid and replaced.  She has type 2 diabetes.  She  has hypercholesterolemia.  She gets a weekly allergy shot.   DRUG ALLERGIES:  There are none.   PHYSICAL EXAMINATION:  GENERAL:  Physical examination revealed a well  developed, well nourished, somewhat obese white female.  There were no other  specific physical findings.  She does acknowledge getting yearly mammograms  and is concerned about a pelvic exam.  It was explained to her that since  she has had a surgical hysterectomy, a pelvic exam is not as important in  her situation, although she should have one every few years.   SOCIAL HISTORY:  She graduated high school in 1983.  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  In 1985, she married.  She  was married for two years.  Her husband was able to speak and he fathered a  child out of wedlock and hence, they divorced.   FAMILY HISTORY:  Unknown.   MENTAL STATUS EXAM:  Her mental status exam reveals that she is alert and  oriented x 3.  She is well groomed.  She is cooperative.  She is deaf; she  does not speak, she signs.  Her mood was labile, especially when discussing  her mother's illness.  Her mother is critically ill and may not live.  Thought processes are positive for paranoia, persecutory ideas, and somewhat  grandiose.  Her concentration and memory appear to be intact.  Her judgment  and insight are fair.  She has at least average intelligence.  She may have  higher intelligence.   ADMISSION DIAGNOSES:   AXIS I:  1. Schizophrenia, paranoid.  2. Rule out schizoaffective disorder, bipolar, currently depressed.   AXIS II:  Deferred.   AXIS III:  1. Deafness.  2. Hypothyroidism.  3. Status post surgical menopause.  4. Hypercholesterolemia.  5. Type 2 diabetes.  6. Allergies.   AXIS IV:  Mother is critically ill.  She is also having  severe relationship  issues with her father and sister-in-law.  She has medical problems.   AXIS V:   INITIAL PLAN OF CARE:  The plan is to admit for stabilization, to adjust her  medications as indicated.  We will check her TSH.  We will check why Permax  (pergolide) has been prescribed.  We will be holding that for now as it may  cause hallucinations.  We will start Trileptal to help with labile mood.  The interpreters were contacted and they will be coming to help [place] with  [facility] care.   ESTIMATED LENGTH OF STAY:  Tentative length of stay is probably five to  seven days.     Vic Ripper, P.A.-C.               Jeanice Lim, M.D.    MD/MEDQ  D:  01/02/2003  T:  01/03/2003  Job:  [id number]

## 2011-01-18 ENCOUNTER — Other Ambulatory Visit: Payer: Self-pay | Admitting: Internal Medicine

## 2011-01-18 DIAGNOSIS — Z1231 Encounter for screening mammogram for malignant neoplasm of breast: Secondary | ICD-10-CM

## 2011-03-01 ENCOUNTER — Ambulatory Visit
Admission: RE | Admit: 2011-03-01 | Discharge: 2011-03-01 | Disposition: A | Discharge status: Home / Self Care | Payer: Medicare Other | Source: Ambulatory Visit | Attending: Internal Medicine | Admitting: Internal Medicine

## 2011-03-01 DIAGNOSIS — Z1231 Encounter for screening mammogram for malignant neoplasm of breast: Secondary | ICD-10-CM

## 2011-05-15 DIAGNOSIS — J309 Allergic rhinitis, unspecified: Secondary | ICD-10-CM | POA: Diagnosis not present

## 2011-05-20 DIAGNOSIS — F259 Schizoaffective disorder, unspecified: Secondary | ICD-10-CM | POA: Diagnosis not present

## 2011-05-20 DIAGNOSIS — J309 Allergic rhinitis, unspecified: Secondary | ICD-10-CM | POA: Diagnosis not present

## 2011-05-27 DIAGNOSIS — J309 Allergic rhinitis, unspecified: Secondary | ICD-10-CM | POA: Diagnosis not present

## 2011-06-11 DIAGNOSIS — J309 Allergic rhinitis, unspecified: Secondary | ICD-10-CM | POA: Diagnosis not present

## 2011-06-26 DIAGNOSIS — F259 Schizoaffective disorder, unspecified: Secondary | ICD-10-CM | POA: Diagnosis not present

## 2011-07-15 DIAGNOSIS — J309 Allergic rhinitis, unspecified: Secondary | ICD-10-CM | POA: Diagnosis not present

## 2011-07-26 DIAGNOSIS — J309 Allergic rhinitis, unspecified: Secondary | ICD-10-CM | POA: Diagnosis not present

## 2011-07-26 DIAGNOSIS — F259 Schizoaffective disorder, unspecified: Secondary | ICD-10-CM | POA: Diagnosis not present

## 2011-08-14 DIAGNOSIS — J309 Allergic rhinitis, unspecified: Secondary | ICD-10-CM | POA: Diagnosis not present

## 2011-08-19 DIAGNOSIS — J309 Allergic rhinitis, unspecified: Secondary | ICD-10-CM | POA: Diagnosis not present

## 2011-08-23 ENCOUNTER — Encounter: Payer: Self-pay | Admitting: Internal Medicine

## 2011-08-23 ENCOUNTER — Ambulatory Visit (INDEPENDENT_AMBULATORY_CARE_PROVIDER_SITE_OTHER): Payer: Medicare Other | Admitting: Internal Medicine

## 2011-08-23 VITALS — BP 106/72 | HR 86 | Ht 65.0 in | Wt 204.8 lb

## 2011-08-23 DIAGNOSIS — J309 Allergic rhinitis, unspecified: Secondary | ICD-10-CM

## 2011-08-23 DIAGNOSIS — G4733 Obstructive sleep apnea (adult) (pediatric): Secondary | ICD-10-CM | POA: Diagnosis not present

## 2011-08-23 DIAGNOSIS — J45909 Unspecified asthma, uncomplicated: Secondary | ICD-10-CM | POA: Diagnosis not present

## 2011-08-23 MED ORDER — ALBUTEROL SULFATE HFA 108 (90 BASE) MCG/ACT IN AERS: 2.0000 | INHALATION_SPRAY | Freq: Four times a day (QID) | RESPIRATORY_TRACT | Status: DC | PRN

## 2011-08-23 NOTE — Patient Instructions (Signed)
Script for rescue albuterol inhaler sent to drug store  Order- [facility] Sleep study    Dx OSA  Use up the allergy vaccine at Dr Blossom Hoops office, since you have paid for it.  When that is done, let's stop your allergy shots at least for awhile, and see how you do without them. We can retest you and restart your shots later if needed. We can talk about this again next time I see you.

## 2011-08-23 NOTE — Progress Notes (Signed)
51 yoFnever smoker, self-referred for help with snoring. I had seen her for asthma as a teenager.         Mother here Deaf since meningitis as an infant, but able to hear snoring which wakes her. Wakes frequently. No sleep walking or movement disorder, but gets up to bathroom or to eat repeatedly. Then tired next day and naps. Mother has OSA. Chronic allergic rhinitis and asthma. Has been on allergy shots from Dr Corinda Gubler since age 51. Needs rescue inhaler. Bedtime around 9:30 PM but has difficulty initiating and maintaining sleep. Easily disturbed. May try to sleep as late as 10:30 in the morning. Cough and throat tickle blamed on allergic rhinitis with postnasal drip. Takes Nasalcrom. No ENT surgery.  Prior to Admission medications   Medication Sig Start Date End Date Taking? Authorizing Provider  ALPRAZolam Prudy Feeler) 0.5 MG tablet Take 0.5 mg by mouth 2 (two) times daily as needed.    Yes Historical Provider, MD  AMBULATORY NON FORMULARY MEDICATION 1 vial once a week. Medication Name: allergy injections.    Yes Historical Provider, MD  ARIPiprazole (ABILIFY) 15 MG tablet Take 15 mg by mouth daily.     Yes Historical Provider, MD  citalopram (CELEXA) 20 MG tablet Take 1 tablet by mouth daily.   Yes Historical Provider, MD  estradiol (ESTRACE) 1 MG tablet Take 1 mg by mouth daily.     Yes Historical Provider, MD  lamoTRIgine (LAMICTAL) 25 MG tablet Take 1 tablet by mouth daily.   Yes Historical Provider, MD  levothyroxine (SYNTHROID) 75 MCG tablet Take 75 mcg by mouth daily.     Yes Historical Provider, MD  lubiprostone (AMITIZA) 24 MCG capsule Take 24 mcg by mouth 2 (two) times daily as needed.    Yes Historical Provider, MD  meclizine (ANTIVERT) 25 MG tablet as needed.   Yes Historical Provider, MD  simvastatin (ZOCOR) 20 MG tablet Take 20 mg by mouth at bedtime.     Yes Historical Provider, MD   Past Medical History  Diagnosis Date  . Constipation   . Gastroparesis   . Hypothyroidism   .  Type II or unspecified type diabetes mellitus without mention of complication, not stated as uncontrolled   . Anxiety   . Hyperlipemia   . Depression   . Deafness    Past Surgical History  Procedure Date  . Abdominal hysterectomy    History   Social History  . Marital Status: Divorced    Spouse Name: N/A    Number of Children: 0  . Years of Education: N/A   Occupational History  . disablied    Social History Main Topics  . Smoking status: Never Smoker   . Smokeless tobacco: Never Used  . Alcohol Use: No  . Drug Use: No  . Sexually Active: Not on file   Other Topics Concern  . Not on file   Social History Narrative  . No narrative on file   Family History  Problem Relation Age of Onset  . Diabetes Mother   . Heart disease Father   . Colon cancer Neg Hx   . Cancer Mother     Benign  . Liver cancer Maternal Grandmother   . Heart attack Maternal Grandfather   . Emphysema Paternal Grandfather   . Multiple myeloma Father   . Emphysema Father   . Heart attack Father      ROS-see HPI Constitutional:   No-   weight loss, night sweats, fevers, chills,+ fatigue, lassitude.  HEENT:   No-  headaches, difficulty swallowing, tooth/dental problems, sore throat,      + sneezing, itching, ear ache, nasal congestion, post nasal drip,  CV:  No-   chest pain, orthopnea, PND, swelling in lower extremities, anasarca, dizziness, palpitations Resp: No-   shortness of breath with exertion or at rest.              No-   productive cough,  No non-productive cough,  No- coughing up of blood.              No-   change in color of mucus.  No- wheezing.   Skin: No-   rash or lesions. GI:  No-   heartburn, indigestion, abdominal pain, nausea, vomiting, diarrhea,                 change in bowel habits, loss of appetite GU: No-   dysuria, . MS:  No-   joint pain or swelling.   Neuro-     nothing unusual Psych:  No- change in mood or affect. No depression or anxiety.  No memory  loss.  OBJ- Physical Exam General- Alert, Oriented, Affect-appropriate, Distress- none acute, overweight Skin- rash-none, lesions- none, excoriation- none Lymphadenopathy- none Head- atraumatic            Eyes- Gross vision intact, PERRLA, conjunctivae and secretions clear            Ears- Hearing markedly impaired, no hearing aids. TMs look retracted.            Nose- Clear, no-Septal dev, mucus, polyps, erosion, perforation             Throat- Mallampati II-III , mucosa clear , drainage- none, tonsils- atrophic Neck- flexible , trachea midline, no stridor , thyroid nl, carotid no bruit Chest - symmetrical excursion , unlabored           Heart/CV- RRR , no murmur , no gallop  , no rub, nl s1 s2                           - JVD- none , edema- none, stasis changes- none, varices- none           Lung- clear to P&A, wheeze- none, cough- none , dullness-none, rub- none           Chest wall-  Abd- tender-no, distended-no, bowel sounds-present, HSM- no Br/ Gen/ Rectal- Not done, not indicated Extrem- cyanosis- none, clubbing, none, atrophy- none, strength- nl Neuro- grossly intact to observation   "

## 2011-08-26 ENCOUNTER — Telehealth: Payer: Self-pay | Admitting: Internal Medicine

## 2011-08-26 NOTE — Telephone Encounter (Signed)
Spoke with pt's mother and she stated that pt did not do the home study as arranged for Sunday night (08/25/11) due to pt's father having surgery. Mother advised that device would have to be re-programed. Mother stated that she would bring the device back up here on Tues. 08/27/11 and pt would do the study on Tues 08/27/11 evening.

## 2011-08-28 DIAGNOSIS — J302 Other seasonal allergic rhinitis: Secondary | ICD-10-CM | POA: Insufficient documentation

## 2011-08-28 DIAGNOSIS — G4733 Obstructive sleep apnea (adult) (pediatric): Secondary | ICD-10-CM | POA: Insufficient documentation

## 2011-08-28 DIAGNOSIS — J4520 Mild intermittent asthma, uncomplicated: Secondary | ICD-10-CM | POA: Insufficient documentation

## 2011-08-28 DIAGNOSIS — J3089 Other allergic rhinitis: Secondary | ICD-10-CM | POA: Insufficient documentation

## 2011-08-28 NOTE — Assessment & Plan Note (Signed)
Presumptive diagnosis based on history and exam. Plan-we discussed and will schedule a sleep study.

## 2011-08-28 NOTE — Assessment & Plan Note (Signed)
Plan-prescription for albuterol rescue inhaler to have available.

## 2011-08-28 NOTE — Assessment & Plan Note (Signed)
She is interested in stopping allergy vaccine. She will use up and then stop vaccine from her previous office. We will try to manage symptomatically but can reassess as needed.

## 2011-08-30 DIAGNOSIS — F259 Schizoaffective disorder, unspecified: Secondary | ICD-10-CM | POA: Diagnosis not present

## 2011-08-30 DIAGNOSIS — J309 Allergic rhinitis, unspecified: Secondary | ICD-10-CM | POA: Diagnosis not present

## 2011-09-02 ENCOUNTER — Telehealth: Payer: Self-pay | Admitting: Internal Medicine

## 2011-09-02 DIAGNOSIS — E785 Hyperlipidemia, unspecified: Secondary | ICD-10-CM | POA: Diagnosis not present

## 2011-09-02 DIAGNOSIS — G609 Hereditary and idiopathic neuropathy, unspecified: Secondary | ICD-10-CM | POA: Diagnosis not present

## 2011-09-02 DIAGNOSIS — R7301 Impaired fasting glucose: Secondary | ICD-10-CM | POA: Diagnosis not present

## 2011-09-02 DIAGNOSIS — E039 Hypothyroidism, unspecified: Secondary | ICD-10-CM | POA: Diagnosis not present

## 2011-09-02 NOTE — Telephone Encounter (Signed)
Pt's mother is calling for results of home sleep study that was done 08/29/11.  CY, please advise of results.  Thank you!

## 2011-09-02 NOTE — Telephone Encounter (Signed)
I spoke to mother- Heidi Good is deaf. Her home sleep study does demonstrate OSA, 14.1/ hr. [name] would like to try CPAP.

## 2011-09-09 ENCOUNTER — Telehealth: Payer: Self-pay | Admitting: Internal Medicine

## 2011-09-09 DIAGNOSIS — G4733 Obstructive sleep apnea (adult) (pediatric): Secondary | ICD-10-CM

## 2011-09-09 NOTE — Telephone Encounter (Signed)
Called, spoke with pt's mom, Okey Regal.  States CDY called last week and advised he would place order for CPAP and they would receive a call regarding this within 2 days. They have not heard anything yet.  Dr. Maple Hudson, I do not see any orders for cpap.  Pls advise. Thank you.

## 2011-09-09 NOTE — Telephone Encounter (Signed)
Heidi Budge, MD 09/02/2011 5:30 PM Signed  I spoke to mother- [name] is deaf. Her home sleep study does demonstrate OSA, 14.1/ hr. Heidi Good would like to try CPAP.

## 2011-09-09 NOTE — Telephone Encounter (Signed)
Called # provided above - received msg stating "the VM box is unable to receive msgs at this time.  Pls try your call again later.  Goodbye."  [facility]

## 2011-09-11 NOTE — Telephone Encounter (Signed)
CY - please advise on order for CPAP and what settings to use.

## 2011-09-13 NOTE — Telephone Encounter (Signed)
Home Sleep Study has been read- given to [place].  Order DME new CPAP autotitrate for pressure recommendation x 1 week, mask of choice, humidifier, supplies, dx OSA Need f/u appointment with me in 6 weeks or so.

## 2011-09-13 NOTE — Telephone Encounter (Signed)
I spoke with pt and is aware of CDY recs. i have sent order to [facility] and pt has been scheduled to see CDY 10/25/11 at 1:45 for her f/u.

## 2011-09-16 ENCOUNTER — Ambulatory Visit (INDEPENDENT_AMBULATORY_CARE_PROVIDER_SITE_OTHER): Payer: Medicare Other | Admitting: Internal Medicine

## 2011-09-16 DIAGNOSIS — G4733 Obstructive sleep apnea (adult) (pediatric): Secondary | ICD-10-CM | POA: Diagnosis not present

## 2011-09-16 DIAGNOSIS — J309 Allergic rhinitis, unspecified: Secondary | ICD-10-CM | POA: Diagnosis not present

## 2011-09-16 NOTE — Progress Notes (Signed)
51 yoFnever smoker, self-referred for help with snoring. I had seen her for asthma as a teenager.         Mother here Deaf since meningitis as an infant, but able to hear snoring which wakes her. Wakes frequently. No sleep walking or movement disorder, but gets up to bathroom or to eat repeatedly. Then tired next day and naps. Mother has OSA. Chronic allergic rhinitis and asthma. Has been on allergy shots from Dr Corinda Gubler since age 51. Needs rescue inhaler. Bedtime around 9:30 PM but has difficulty initiating and maintaining sleep. Easily disturbed. May try to sleep as late as 10:30 in the morning. Cough and throat tickle blamed on allergic rhinitis with postnasal drip. Takes Nasalcrom. No ENT surgery.    ROS-see HPI Constitutional:   No-   weight loss, night sweats, fevers, chills,+ fatigue, lassitude. HEENT:   No-  headaches, difficulty swallowing, tooth/dental problems, sore throat,      + sneezing, itching, ear ache, nasal congestion, post nasal drip,  CV:  No-   chest pain, orthopnea, PND, swelling in lower extremities, anasarca, dizziness, palpitations Resp: No-   shortness of breath with exertion or at rest.              No-   productive cough,  No non-productive cough,  No- coughing up of blood.              No-   change in color of mucus.  No- wheezing.   Skin: No-   rash or lesions. GI:  No-   heartburn, indigestion, abdominal pain, nausea, vomiting, diarrhea,                 change in bowel habits, loss of appetite GU: No-   dysuria, . MS:  No-   joint pain or swelling.   Neuro-     nothing unusual Psych:  No- change in mood or affect. No depression or anxiety.  No memory loss.  OBJ- Physical Exam General- Alert, Oriented, Affect-appropriate, Distress- none acute, overweight Skin- rash-none, lesions- none, excoriation- none Lymphadenopathy- none Head- atraumatic            Eyes- Gross vision intact, PERRLA, conjunctivae and secretions clear            Ears- Hearing markedly  impaired, no hearing aids. TMs look retracted.            Nose- Clear, no-Septal dev, mucus, polyps, erosion, perforation             Throat- Mallampati II-III , mucosa clear , drainage- none, tonsils- atrophic Neck- flexible , trachea midline, no stridor , thyroid nl, carotid no bruit Chest - symmetrical excursion , unlabored           Heart/CV- RRR , no murmur , no gallop  , no rub, nl s1 s2                           - JVD- none , edema- none, stasis changes- none, varices- none           Lung- clear to P&A, wheeze- none, cough- none , dullness-none, rub- none           Chest wall-  Abd- tender-no, distended-no, bowel sounds-present, HSM- no Br/ Gen/ Rectal- Not done, not indicated Extrem- cyanosis- none, clubbing, none, atrophy- none, strength- nl Neuro- grossly intact to observation   "

## 2011-09-19 DIAGNOSIS — J309 Allergic rhinitis, unspecified: Secondary | ICD-10-CM | POA: Diagnosis not present

## 2011-10-11 ENCOUNTER — Ambulatory Visit (INDEPENDENT_AMBULATORY_CARE_PROVIDER_SITE_OTHER): Payer: Medicare Other | Admitting: Internal Medicine

## 2011-10-11 ENCOUNTER — Encounter: Payer: Self-pay | Admitting: Internal Medicine

## 2011-10-11 VITALS — BP 128/76 | HR 59 | Ht 65.0 in | Wt 205.8 lb

## 2011-10-11 DIAGNOSIS — G4733 Obstructive sleep apnea (adult) (pediatric): Secondary | ICD-10-CM | POA: Diagnosis not present

## 2011-10-11 DIAGNOSIS — J45909 Unspecified asthma, uncomplicated: Secondary | ICD-10-CM

## 2011-10-11 DIAGNOSIS — J309 Allergic rhinitis, unspecified: Secondary | ICD-10-CM

## 2011-10-11 MED ORDER — ALBUTEROL SULFATE (2.5 MG/3ML) 0.083% IN NEBU: 2.5000 mg | INHALATION_SOLUTION | Freq: Four times a day (QID) | RESPIRATORY_TRACT | Status: DC | PRN

## 2011-10-11 MED ORDER — LORATADINE 10 MG PO TABS: 10.0000 mg | ORAL_TABLET | Freq: Every day | ORAL | Status: DC

## 2011-10-11 NOTE — Progress Notes (Signed)
51 yoFnever smoker, self-referred for help with snoring. I had seen her for asthma as a teenager.         Mother here Deaf since meningitis as an infant, but able to hear snoring which wakes her. Wakes frequently. No sleep walking or movement disorder, but gets up to bathroom or to eat repeatedly. Then tired next day and naps. Mother has OSA. Chronic allergic rhinitis and asthma. Has been on allergy shots from Dr Corinda Gubler since age 51. Needs rescue inhaler. Bedtime around 9:30 PM but has difficulty initiating and maintaining sleep. Easily disturbed. May try to sleep as late as 10:30 in the morning. Cough and throat tickle blamed on allergic rhinitis with postnasal drip. Takes Nasalcrom. No ENT surgery.  10/11/11- 51 yoF never smoker, deaf,  self-referred for help with snoring. I had seen her for allergic asthma as a teenager.          Mother here. She had been on allergy shots for many years and finally stopped last week. They describe coughing fits. Over-the-counter cough syrups did not help. Eyes itch. Some sneezing. Occasional use of rescue inhaler.  OSA-unattended home sleep study on 08/27/2011 confirmed mild obstructive sleep apnea, AHI of 14.1 per hour with moderate snoring and oxygen desaturation to 81%. She returns now after CPAP autotitration. Good control and compliance. Titrated to a best pressure range of 10.4. She is tolerating initial CPAP pretty well. Today we discussed alternatives and how to work with the home care company ([facility]).  ROS-see HPI Constitutional:   No-   weight loss, night sweats, fevers, chills,+ fatigue, lassitude. HEENT:   No-  headaches, difficulty swallowing, tooth/dental problems, sore throat, She is deaf- reads lips and signs.     No- sneezing, itching, ear ache, nasal congestion, post nasal drip,  CV:  No-   chest pain, orthopnea, PND, swelling in lower extremities, anasarca, dizziness, palpitations Resp: No-   shortness of breath with exertion or at rest.         No-   productive cough,  No non-productive cough,  No- coughing up of blood.              No-   change in color of mucus.  No- wheezing.   Skin: No-   rash or lesions. GI:  No-   heartburn, indigestion, abdominal pain, nausea, vomiting,  GU:  MS:  No-   joint pain or swelling.   Neuro-     nothing unusual Psych:  No- change in mood or affect. No depression or anxiety.  No memory loss.  OBJ- Physical Exam General- Alert, Oriented, Affect-appropriate, Distress- none acute, overweight Skin- rash-none, lesions- none, excoriation- none Lymphadenopathy- none Head- atraumatic            Eyes- Gross vision intact, PERRLA, conjunctivae and secretions clear            Ears- Hearing markedly impaired, no hearing aids. TMs look retracted.            Nose- Clear, no-Septal dev, mucus, polyps, erosion, perforation             Throat- Mallampati II-III , mucosa clear , drainage- none, tonsils- atrophic Neck- flexible , trachea midline, no stridor , thyroid nl, carotid no bruit Chest - symmetrical excursion , unlabored           Heart/CV- RRR , no murmur , no gallop  , no rub, nl s1 s2                           -  JVD- none , edema- none, stasis changes- none, varices- none           Lung- clear to P&A, wheeze- none, cough- none , dullness-none, rub- none           Chest wall-  Abd-  Br/ Gen/ Rectal- Not done, not indicated Extrem- cyanosis- none, clubbing, none, atrophy- none, strength- nl Neuro- grossly intact to observation   "

## 2011-10-11 NOTE — Patient Instructions (Addendum)
For the itching allergy in your eyes and nose- try an otc antihistamine like Claritin/ loratadine or Allegra/ fexofenadine. These are both very safe. You can take one every day, or just when you think you need it.  Use your rescue albuterol inhaler  2 puffs, up to 4 times daily as needed for chest tightness, wheeze and cough "asthma"  Script for albuterol nebulizer medicine that you can use up to 4 times daily if needed.   You can decide if you want to talk with our [facility] ladies, Bjorn Loser and Almyra Free, about changing DME companies for your CPAP  I will set at 11 based on the download

## 2011-10-14 DIAGNOSIS — F259 Schizoaffective disorder, unspecified: Secondary | ICD-10-CM | POA: Diagnosis not present

## 2011-10-16 NOTE — Assessment & Plan Note (Signed)
Well-controlled, needing occasional rescue inhaler.

## 2011-10-16 NOTE — Assessment & Plan Note (Signed)
She will probably do well with antihistamines. We can add a nasal spray if needed. We are now past most of the spring pollen season.

## 2011-10-25 ENCOUNTER — Ambulatory Visit: Payer: Medicare Other | Admitting: Internal Medicine

## 2011-11-04 ENCOUNTER — Encounter: Payer: Self-pay | Admitting: Internal Medicine

## 2011-11-22 ENCOUNTER — Encounter: Payer: Self-pay | Admitting: Internal Medicine

## 2011-11-22 ENCOUNTER — Ambulatory Visit (INDEPENDENT_AMBULATORY_CARE_PROVIDER_SITE_OTHER): Payer: Medicare Other | Admitting: Internal Medicine

## 2011-11-22 VITALS — BP 124/68 | HR 61 | Ht 65.0 in | Wt 205.0 lb

## 2011-11-22 DIAGNOSIS — J45909 Unspecified asthma, uncomplicated: Secondary | ICD-10-CM

## 2011-11-22 DIAGNOSIS — J4520 Mild intermittent asthma, uncomplicated: Secondary | ICD-10-CM

## 2011-11-22 DIAGNOSIS — G4733 Obstructive sleep apnea (adult) (pediatric): Secondary | ICD-10-CM | POA: Diagnosis not present

## 2011-11-22 NOTE — Progress Notes (Signed)
51 yoFnever smoker, self-referred for help with snoring. I had seen her for asthma as a teenager.         Mother here Deaf since meningitis as an infant, but able to hear snoring which wakes her. Wakes frequently. No sleep walking or movement disorder, but gets up to bathroom or to eat repeatedly. Then tired next day and naps. Mother has OSA. Chronic allergic rhinitis and asthma. Has been on allergy shots from Dr Corinda Gubler since age 51. Needs rescue inhaler. Bedtime around 9:30 PM but has difficulty initiating and maintaining sleep. Easily disturbed. May try to sleep as late as 10:30 in the morning. Cough and throat tickle blamed on allergic rhinitis with postnasal drip. Takes Nasalcrom. No ENT surgery.  10/11/11- 51 yoF never smoker, deaf,  self-referred for help with snoring. I had seen her for allergic asthma as a teenager.          Mother here. She had been on allergy shots for many years and finally stopped last week. They describe coughing fits. Over-the-counter cough syrups did not help. Eyes itch. Some sneezing. Occasional use of rescue inhaler.  OSA-unattended home sleep study on 08/27/2011 confirmed mild obstructive sleep apnea, AHI of 14.1 per hour with moderate snoring and oxygen desaturation to 81%. She returns now after CPAP autotitration. Good control and compliance. Titrated to a best pressure range of 10.4. She is tolerating initial CPAP pretty well. Today we discussed alternatives and how to work with the home care company ([facility]).  11/22/11- 51 yoF never smoker, deaf,  Followed for OSA/ CPAP. I had seen her for allergic asthma as a teenager. Mother here. Wears CPAP every night-still has cool feelings from pressure in machine; lower pressure to 5.0 as it was feeling like too much. CPAP 11/ [facility]. I think she had turned a humidifier or the ramp, thinking she was changing the pressure. Cough is productive of scant clear sputum with no wheezing noted by mother. Has used rescue inhaler a  few times but has not needed her nebulizer machine.  ROS-see HPI Constitutional:   No-   weight loss, night sweats, fevers, chills,+ fatigue, lassitude. HEENT:   No-  headaches, difficulty swallowing, tooth/dental problems, sore throat, She is deaf- reads lips and signs.     No- sneezing, itching, ear ache, nasal congestion, post nasal drip,  CV:  No-   chest pain, orthopnea, PND, swelling in lower extremities, anasarca, dizziness, palpitations Resp: No-   shortness of breath with exertion or at rest.              + productive cough,  No non-productive cough,  No- coughing up of blood.              No-   change in color of mucus.  + wheezing.   Skin: No-   rash or lesions. GI:  No-   heartburn, indigestion, abdominal pain, nausea, vomiting,  GU:  MS:  No-   joint pain or swelling.   Neuro-     nothing unusual Psych:  No- change in mood or affect. No depression or anxiety.  No memory loss.  OBJ- Physical Exam General- Alert, Oriented, Affect-appropriate, Distress- none acute, overweight. Signs and lip-reads. Skin- rash-none, lesions- none, excoriation- none Lymphadenopathy- none Head- atraumatic            Eyes- Gross vision intact, PERRLA, conjunctivae and secretions clear            Ears- Hearing markedly impaired, no hearing aids. TMs  look retracted.            Nose- Clear, no-Septal dev, mucus, polyps, erosion, perforation             Throat- Mallampati II-III , mucosa clear , drainage- none, tonsils- atrophic Neck- flexible , trachea midline, no stridor , thyroid nl, carotid no bruit Chest - symmetrical excursion , unlabored           Heart/CV- RRR , no murmur , no gallop  , no rub, nl s1 s2                           - JVD- none , edema- none, stasis changes- none, varices- none           Lung- clear to P&A, wheeze- none, cough- none , dullness-none, rub- none           Chest wall-  Abd-  Br/ Gen/ Rectal- Not done, not indicated Extrem- cyanosis- none, clubbing, none, atrophy-  none, strength- nl Neuro- grossly intact to observation   "

## 2011-11-22 NOTE — Patient Instructions (Addendum)
Try sample Dulera (blue and white) 100  Maintenance controller inhaler      2 puffs then rinse mouth, twice every day  You can still use your rescue inhaler albuterol HFA- 2 puffs, up to 4 times daily OR the nebulizer machine up to 4 times daily, if [facility] doesn't keep you comfortable.   Continue CPAP  For allergy itching, sneezing, runny nose- try otc antihistamine like Claritin/ loratadine or Allegra/ fexofenadine if needed

## 2011-11-25 DIAGNOSIS — F259 Schizoaffective disorder, unspecified: Secondary | ICD-10-CM | POA: Diagnosis not present

## 2011-11-27 ENCOUNTER — Encounter: Payer: Self-pay | Admitting: Internal Medicine

## 2011-12-02 NOTE — Assessment & Plan Note (Signed)
She may need a maintenance controller. Try sample [facility]

## 2011-12-02 NOTE — Assessment & Plan Note (Signed)
We will leave the pressure at 11

## 2011-12-17 ENCOUNTER — Encounter: Payer: Self-pay | Admitting: Internal Medicine

## 2011-12-25 DIAGNOSIS — F259 Schizoaffective disorder, unspecified: Secondary | ICD-10-CM | POA: Diagnosis not present

## 2012-01-10 DIAGNOSIS — E039 Hypothyroidism, unspecified: Secondary | ICD-10-CM | POA: Diagnosis not present

## 2012-01-10 DIAGNOSIS — R7301 Impaired fasting glucose: Secondary | ICD-10-CM | POA: Diagnosis not present

## 2012-01-10 DIAGNOSIS — G609 Hereditary and idiopathic neuropathy, unspecified: Secondary | ICD-10-CM | POA: Diagnosis not present

## 2012-01-10 DIAGNOSIS — E785 Hyperlipidemia, unspecified: Secondary | ICD-10-CM | POA: Diagnosis not present

## 2012-01-22 DIAGNOSIS — F259 Schizoaffective disorder, unspecified: Secondary | ICD-10-CM | POA: Diagnosis not present

## 2012-01-29 ENCOUNTER — Encounter: Payer: Self-pay | Admitting: Gastroenterology

## 2012-01-31 ENCOUNTER — Encounter: Payer: Self-pay | Admitting: Internal Medicine

## 2012-02-03 ENCOUNTER — Telehealth: Payer: Self-pay | Admitting: Internal Medicine

## 2012-02-03 DIAGNOSIS — G4733 Obstructive sleep apnea (adult) (pediatric): Secondary | ICD-10-CM

## 2012-02-03 NOTE — Telephone Encounter (Signed)
The download indicates she has only been using CPAP for a couple of hours per night, so that's the point. She can't insist she needs it if she doesn't wear it.  Her first study was a home study, because of her hearing problems. We would have to check with her insurance about their willingness to repeat an unattended home study. We also need PCP to verify with DME that she has to get new study.

## 2012-02-03 NOTE — Telephone Encounter (Signed)
Spoke with pt's Mother. She states that [facility] advised her that the pt will need to have another sleep study done in order for her to keep her CPAP machine due to past non compliance. CDY, please advise thanks!

## 2012-02-04 NOTE — Telephone Encounter (Signed)
ATC x1 - no option to LM per automated message.

## 2012-02-05 NOTE — Telephone Encounter (Signed)
Heidi Good called back; will send staff message to her as at the time I did not have all information to give her on patient. Will await a call or message back from [place].

## 2012-02-05 NOTE — Telephone Encounter (Signed)
Spoke with Carol-understands that pt really needs to try and use more every night; mother states that she had [facility] to pick up machine last Friday as she wasn't going to pay $1200 for the machine as they continued to tell her that the patient needs a new sleep study. Okey Regal aware that I am calling [facility] to check on this and will keep her updated.  I have left message for Mayra Reel to call me back about this.

## 2012-02-06 NOTE — Telephone Encounter (Signed)
Per Mayra Reel via staff message:On 09/19 - patient was only 17% compliant with CPAP based off of her current download. Delfin Gant (in our office) called the mother and explained that due to the percentage of the compliance, her insurance will not cover the equipment. She refused to sign an ABN and and requested that we pickup the CPAP. Hope that helps!!

## 2012-02-06 NOTE — Telephone Encounter (Signed)
Heidi Good, can you please assist CY and I in finding out if patient truly needs to have a new sleep study to get CPAP back as she shown non compliance. Thanks.

## 2012-02-06 NOTE — Telephone Encounter (Signed)
CY based on this information are you okay with placing order for new sleep study(had home sleep last time) with the understanding that patient will use CPAP as she is suppose to. Thanks.

## 2012-02-06 NOTE — Telephone Encounter (Signed)
Pt's insurance requires that patient's are 70% compliant every 30 days. So if patient was only 17% compliant, then patient will have to turn her cpap machine back in to DME or private pay for the cpap. Her insurance will not cover her to be on this therapy if she is not going to use it on a regular basis. She will have to have another sleep study to re-qualify her for the cpap. Hope this has answered your question. Rhonda J Cobb

## 2012-02-07 ENCOUNTER — Other Ambulatory Visit: Payer: Self-pay | Admitting: Internal Medicine

## 2012-02-07 DIAGNOSIS — Z1231 Encounter for screening mammogram for malignant neoplasm of breast: Secondary | ICD-10-CM

## 2012-02-12 NOTE — Telephone Encounter (Signed)
Patient will need to have a new sleep study per guidelines with pt's insurance. Heidi Good

## 2012-02-12 NOTE — Telephone Encounter (Signed)
Did we ever clarify with [facility] whether patient really needs new study, vs just autotitrating for presure check??

## 2012-02-17 NOTE — Telephone Encounter (Signed)
See notes from [facility]. If she is going to try again with CPAP, we need a new sleep study to re-qualify her. Because of her deafness/ inability to hear instructions, I would think to repeat her [facility] unattended home study for dx OSA, so we would need to see if her Medicare/ insurance will either authorize a repeat home study, or a sleep center study. If done at center, mother would need to stay overnight to sign/ interpret.

## 2012-02-18 NOTE — Telephone Encounter (Signed)
Called patient's home number. No answer and voice mail has not been set up yet, so I was unable to leave a message. I will call back this afternoon. Heidi Good

## 2012-02-18 NOTE — Telephone Encounter (Signed)
Spoke with pt's mother, Okey Regal, and she stated that whatever study Dr. Maple Hudson thought was best for Heidi Good to do, either home study or sleep study at the sleep lab pt would do either one. Insurance should cover either one. Please place order for study in pcc box. Rhonda J Cobb

## 2012-02-19 NOTE — Telephone Encounter (Signed)
Please order split protocol NPSG at sleep center for dx OSA. Mother will have to accompany and stay with patient, who is deaf.

## 2012-02-19 NOTE — Telephone Encounter (Signed)
Order placed as dictated by CY.  Will close encounter.

## 2012-02-24 ENCOUNTER — Ambulatory Visit (INDEPENDENT_AMBULATORY_CARE_PROVIDER_SITE_OTHER): Payer: Medicare Other | Admitting: Internal Medicine

## 2012-02-24 ENCOUNTER — Encounter: Payer: Self-pay | Admitting: Internal Medicine

## 2012-02-24 VITALS — BP 114/80 | HR 64 | Ht 65.0 in | Wt 197.6 lb

## 2012-02-24 DIAGNOSIS — J309 Allergic rhinitis, unspecified: Secondary | ICD-10-CM | POA: Diagnosis not present

## 2012-02-24 DIAGNOSIS — G4733 Obstructive sleep apnea (adult) (pediatric): Secondary | ICD-10-CM

## 2012-02-24 DIAGNOSIS — Z23 Encounter for immunization: Secondary | ICD-10-CM

## 2012-02-24 MED ORDER — MOMETASONE FURO-FORMOTEROL FUM 100-5 MCG/ACT IN AERO: 2.0000 | INHALATION_SPRAY | Freq: Two times a day (BID) | RESPIRATORY_TRACT | Status: DC

## 2012-02-24 NOTE — Progress Notes (Signed)
51 yoFnever smoker, self-referred for help with snoring. I had seen her for asthma as a teenager.         Mother here Deaf since meningitis as an infant, but able to hear snoring which wakes her. Wakes frequently. No sleep walking or movement disorder, but gets up to bathroom or to eat repeatedly. Then tired next day and naps. Mother has OSA. Chronic allergic rhinitis and asthma. Has been on allergy shots from Dr Corinda Gubler since age 51. Needs rescue inhaler. Bedtime around 9:30 PM but has difficulty initiating and maintaining sleep. Easily disturbed. May try to sleep as late as 10:30 in the morning. Cough and throat tickle blamed on allergic rhinitis with postnasal drip. Takes Nasalcrom. No ENT surgery.  10/11/11- 51 yoF never smoker, deaf,  self-referred for help with snoring. I had seen her for allergic asthma as a teenager.          Mother here. She had been on allergy shots for many years and finally stopped last week. They describe coughing fits. Over-the-counter cough syrups did not help. Eyes itch. Some sneezing. Occasional use of rescue inhaler.  OSA-unattended home sleep study on 08/27/2011 confirmed mild obstructive sleep apnea, AHI of 14.1 per hour with moderate snoring and oxygen desaturation to 81%. She returns now after CPAP autotitration. Good control and compliance. Titrated to a best pressure range of 10.4. She is tolerating initial CPAP pretty well. Today we discussed alternatives and how to work with the home care company ([facility]).  11/22/11- 51 yoF never smoker, deaf,  Followed for OSA/ CPAP. I had seen her for allergic asthma as a teenager. Mother here. Wears CPAP every night-still has cool feelings from pressure in machine; lower pressure to 5.0 as it was feeling like too much. CPAP 11/ [facility]. I think she had turned a humidifier or the ramp, thinking she was changing the pressure. Cough is productive of scant clear sputum with no wheezing noted by mother. Has used rescue inhaler a  few times but has not needed her nebulizer machine.  02/24/12- 51 yoF never smoker, deaf,  Followed for OSA/ CPAP               Mother here- signing translator Pt states she is doing okay with Dulera; new sleep study scheduled for 03-17-12. Her original study was an unattended home study done because she is deaf. Her CPAP had been picked up by DME because of poor compliance and insurance requires update sleep study documenting she needs it. She says she was afraid of it so she would take it off after a couple of hours every night. We're giving a booklet and discussing again the medical issues.  ROS-see HPI Constitutional:   No-   weight loss, night sweats, fevers, chills,+ fatigue, lassitude. HEENT:   No-  headaches, difficulty swallowing, tooth/dental problems, sore throat, She is deaf- reads lips and signs.     No- sneezing, itching, ear ache, +nasal congestion, post nasal drip,  CV:  No-   chest pain, orthopnea, PND, swelling in lower extremities, anasarca, dizziness, palpitations Resp: No-   shortness of breath with exertion or at rest.              + productive cough,  No non-productive cough,  No- coughing up of blood.              No-   change in color of mucus.  + wheezing.   Skin: No-   rash or lesions. GI:  No-  heartburn, indigestion, abdominal pain, nausea, vomiting,  GU:  MS:  No-   joint pain or swelling.   Neuro-     nothing unusual Psych:  No- change in mood or affect. No depression or anxiety.  No memory loss.  OBJ- Physical Exam General- Alert, Oriented, Affect-appropriate, Distress- none acute, overweight. Signs and lip-reads. Skin- rash-none, lesions- none, excoriation- none Lymphadenopathy- none Head- atraumatic            Eyes- Gross vision intact, PERRLA, conjunctivae and secretions clear            Ears- Hearing markedly impaired, no hearing aids. TMs look retracted.            Nose- Clear, no-Septal dev, mucus, polyps, erosion, perforation             Throat-  Mallampati II-III , mucosa clear , drainage- none, tonsils- atrophic Neck- flexible , trachea midline, no stridor , thyroid nl, carotid no bruit Chest - symmetrical excursion , unlabored           Heart/CV- RRR , no murmur , no gallop  , no rub, nl s1 s2                           - JVD- none , edema- none, stasis changes- none, varices- none           Lung- clear to P&A, wheeze- none, cough- none , dullness-none, rub- none           Chest wall-  Abd-  Br/ Gen/ Rectal- Not done, not indicated Extrem- cyanosis- none, clubbing, none, atrophy- none, strength- nl Neuro- grossly intact to observation   "

## 2012-02-24 NOTE — Patient Instructions (Addendum)
Sample of Ventolin rescue albuterol inhaler   2 puffs, every 4 hours if needed for temporary relief of asthma ( or you can use your nebulizer machine)  Script for [facility] 100  Maintenance controller inhaler   2 puffs, then rinse mouth two times every day  Flu vax  Booklet on sleep apnea  Keep appointment November 5 for sleep study

## 2012-03-01 NOTE — Assessment & Plan Note (Signed)
Not clearly having more rhinitis since she stopped allergy vaccine. Discussed use of saline nasal spray

## 2012-03-01 NOTE — Assessment & Plan Note (Signed)
She was not keeping on CPAP at night apparently because she was afraid of it despite our explanation. I reviewed strategy and alternatives again, depending on her mother's ability to explain by signing. We're scheduling a standard [facility] nocturnal polysomnogram and mother is going to stay with her to communicate. Plan-NPSG, booklet, flu vax

## 2012-03-06 ENCOUNTER — Ambulatory Visit
Admission: RE | Admit: 2012-03-06 | Discharge: 2012-03-06 | Disposition: A | Discharge status: Home / Self Care | Payer: Medicare Other | Source: Ambulatory Visit | Attending: Internal Medicine | Admitting: Internal Medicine

## 2012-03-06 DIAGNOSIS — Z1231 Encounter for screening mammogram for malignant neoplasm of breast: Secondary | ICD-10-CM | POA: Diagnosis not present

## 2012-03-14 DIAGNOSIS — F259 Schizoaffective disorder, unspecified: Secondary | ICD-10-CM | POA: Diagnosis not present

## 2012-03-17 ENCOUNTER — Ambulatory Visit (HOSPITAL_BASED_OUTPATIENT_CLINIC_OR_DEPARTMENT_OTHER): Payer: Medicare Other | Attending: Internal Medicine | Admitting: Radiology

## 2012-03-17 VITALS — Ht 65.0 in | Wt 197.0 lb

## 2012-03-17 DIAGNOSIS — G4710 Hypersomnia, unspecified: Secondary | ICD-10-CM | POA: Diagnosis not present

## 2012-03-17 DIAGNOSIS — G4733 Obstructive sleep apnea (adult) (pediatric): Secondary | ICD-10-CM

## 2012-03-17 DIAGNOSIS — G4730 Sleep apnea, unspecified: Secondary | ICD-10-CM | POA: Insufficient documentation

## 2012-03-21 DIAGNOSIS — G4730 Sleep apnea, unspecified: Secondary | ICD-10-CM | POA: Diagnosis not present

## 2012-03-21 DIAGNOSIS — G4710 Hypersomnia, unspecified: Secondary | ICD-10-CM

## 2012-03-21 NOTE — Procedures (Signed)
Heidi Good, Heidi Good               ACCOUNT NO.:  [id number]  MEDICAL RECORD NO.:  [id number]          PATIENT TYPE:  OUT  LOCATION:  SLEEP CENTER                 FACILITY:  [facility]  PHYSICIAN:  Jametta Moorehead D. Maple Hudson, MD, FCCP, FACPDATE OF BIRTH:  February 23, 1961  DATE OF STUDY:  03/17/2012                           NOCTURNAL POLYSOMNOGRAM  REFERRING PHYSICIAN:  Aishia Barkey D. Thomasine Klutts, MD, FCCP, FACP  INDICATION FOR STUDY:  Hypersomnia with sleep apnea.  EPWORTH SLEEPINESS SCORE:  5/24.  BMI 32.8, weight 197 pounds, height 65 inches, neck 15 inches.  MEDICATIONS:  Home medications are charted in Epic.  SLEEP ARCHITECTURE:  Total sleep time 307 minutes with sleep efficiency 76.8%.  Stage I was 7.8%, stage II 78%, stage III 4.2%, REM 9.9% of total sleep time.  Sleep latency 44.5 minutes, REM latency 324.5 minutes, awake after sleep onset 49 minutes, arousal index 13.5.  BEDTIME MEDICATION:  None reported.  RESPIRATORY DATA:  Apnea-hypopnea index (AHI) 2.5 per hour.  A total of 13 events was scored including 3 obstructive apneas, 2 central apneas, 1 mixed apnea, and 7 hypopneas.  Events were associated with nonsupine sleep position and REM.  REM AHI 2 per hour.  There were insufficient numbers of events to qualify for split protocol CPAP titration.  OXYGEN DATA:  Mild snoring with oxygen desaturation to a nadir of 90% and mean oxygen saturation through the study of 94.6% on room air.  CARDIAC DATA:  Normal sinus rhythm.  MOVEMENT-PARASOMNIA:  Few limb jerks were noted with insignificant impact on sleep.  Bathroom x2.  IMPRESSIONS-RECOMMENDATIONS:  Occasional respiratory events with sleep disturbance, within normal limits.  AHI 2.5 per hour (the normal range for adults is between 0 and 5 events per hour).  Mild snoring with oxygen desaturation to a nadir of 90% and mean oxygen saturation through the study of 94.6% on room air.    Latitia Housewright D. Maple Hudson, MD, [facility], FACP Diplomate, American Board of  Sleep Medicine   CDY/MEDQ  D:  03/21/2012 11:29:43  T:  03/21/2012 22:25:29  Job:  [id number]

## 2012-04-21 ENCOUNTER — Ambulatory Visit (INDEPENDENT_AMBULATORY_CARE_PROVIDER_SITE_OTHER): Payer: Medicare Other | Admitting: Internal Medicine

## 2012-04-21 ENCOUNTER — Encounter: Payer: Self-pay | Admitting: Internal Medicine

## 2012-04-21 VITALS — BP 120/72 | HR 62 | Ht 65.0 in | Wt 202.6 lb

## 2012-04-21 DIAGNOSIS — G4733 Obstructive sleep apnea (adult) (pediatric): Secondary | ICD-10-CM | POA: Diagnosis not present

## 2012-04-21 NOTE — Progress Notes (Signed)
51 yoFnever smoker, self-referred for help with snoring. I had seen her for asthma as a teenager.         Mother here Deaf since meningitis as an infant, but able to hear snoring which wakes her. Wakes frequently. No sleep walking or movement disorder, but gets up to bathroom or to eat repeatedly. Then tired next day and naps. Mother has OSA. Chronic allergic rhinitis and asthma. Has been on allergy shots from Dr Corinda Gubler since age 51. Needs rescue inhaler. Bedtime around 9:30 PM but has difficulty initiating and maintaining sleep. Easily disturbed. May try to sleep as late as 10:30 in the morning. Cough and throat tickle blamed on allergic rhinitis with postnasal drip. Takes Nasalcrom. No ENT surgery.  10/11/11- 51 yoF never smoker, deaf,  self-referred for help with snoring. I had seen her for allergic asthma as a teenager.          Mother here. She had been on allergy shots for many years and finally stopped last week. They describe coughing fits. Over-the-counter cough syrups did not help. Eyes itch. Some sneezing. Occasional use of rescue inhaler.  OSA-unattended home sleep study on 08/27/2011 confirmed mild obstructive sleep apnea, AHI of 14.1 per hour with moderate snoring and oxygen desaturation to 81%. She returns now after CPAP autotitration. Good control and compliance. Titrated to a best pressure range of 10.4. She is tolerating initial CPAP pretty well. Today we discussed alternatives and how to work with the home care company ([facility]).  11/22/11- 51 yoF never smoker, deaf,  Followed for OSA/ CPAP. I had seen her for allergic asthma as a teenager. Mother here. Wears CPAP every night-still has cool feelings from pressure in machine; lower pressure to 5.0 as it was feeling like too much. CPAP 11/ [facility]. I think she had turned a humidifier or the ramp, thinking she was changing the pressure. Cough is productive of scant clear sputum with no wheezing noted by mother. Has used rescue inhaler a  few times but has not needed her nebulizer machine.  02/24/12- 51 yoF never smoker, deaf,  Followed for OSA/ CPAP               Mother here- signing translator Pt states she is doing okay with Dulera; new sleep study scheduled for 03-17-12. Her original study was an unattended home study done because she is deaf. Her CPAP had been picked up by DME because of poor compliance and insurance requires update sleep study documenting she needs it. She says she was afraid of it so she would take it off after a couple of hours every night. We're giving a booklet and discussing again the medical issues.  04/21/12- 51 yoF never smoker, deaf,  Followed for OSA/ CPAP    Here w/ boy friend and signing translator FOLLOWS FOR: review sleep study with patient from 03-17-12 NPSG 03/17/12- AHI  2.5/ hr, WNL. Normal oxygenation.  I discussed normal range of respiratory events as being less than or equal to 5 per hour. She says the biggest sleep problem at home is that her parents keep the house much too hot. She does not need CPAP.  ROS-see HPI Constitutional:   No-   weight loss, night sweats, fevers, chills,+ fatigue, lassitude. HEENT:   No-  headaches, difficulty swallowing, tooth/dental problems, sore throat, She is deaf- reads lips and signs.     No- sneezing, itching, ear ache, +nasal congestion, post nasal drip,  CV:  No-   chest pain, orthopnea, PND,  swelling in lower extremities, anasarca, dizziness, palpitations Resp: No-   shortness of breath with exertion or at rest.              No- productive cough,  No non-productive cough,  No- coughing up of blood.              No-   change in color of mucus.  No recent wheezing.   Skin: No-   rash or lesions. GI:  No-   heartburn, indigestion, abdominal pain, nausea, vomiting,  GU:  MS:  No-   joint pain or swelling.   Neuro-     nothing unusual Psych:  No- change in mood or affect. No depression or anxiety.  No memory loss.  OBJ- Physical Exam General- Alert,  Oriented, Affect-appropriate, Distress- none acute, overweight. Signs and lip-reads. Skin- rash-none, lesions- none, excoriation- none Lymphadenopathy- none Head- atraumatic            Eyes- Gross vision intact, PERRLA, conjunctivae and secretions clear            Ears- Hearing markedly impaired, no hearing aids. TMs look retracted.            Nose- Clear, no-Septal dev, mucus, polyps, erosion, perforation             Throat- Mallampati II-III , mucosa clear , drainage- none, tonsils- atrophic Neck- flexible , trachea midline, no stridor , thyroid nl, carotid no bruit Chest - symmetrical excursion , unlabored           Heart/CV- RRR , no murmur , no gallop  , no rub, nl s1 s2                           - JVD- none , edema- none, stasis changes- none, varices- none           Lung- clear to P&A, wheeze- none, cough- none , dullness-none, rub- none           Chest wall-  Abd-  Br/ Gen/ Rectal- Not done, not indicated Extrem- cyanosis- none, clubbing, none, atrophy- none, strength- nl Neuro- grossly intact to observation   "

## 2012-04-21 NOTE — Patient Instructions (Addendum)
Your score on the latest sleep study was within normal limit. You do not need CPAP at this time.  We talked about your difficulty sleeping comfortably at night. Most people sleep best in a cool, dark, quiet room. If you go to bed before you are ready to sleep, then you may find you just lie there. It can help to read or do something restful to unwind at bedtime, but it may be better to sit up and read in another room. If you take long daytime naps, that will use up some of your sleepiness and you won't need as much sleep at night.  I will be happy to see you again if you need me

## 2012-04-27 NOTE — Assessment & Plan Note (Signed)
We've reviewed sleep hygiene and discussed how to talk with her parents about the heat in her bedroom. She does not need CPAP at this time based on formal sleep study.

## 2012-06-01 ENCOUNTER — Ambulatory Visit: Payer: Medicare Other | Admitting: Internal Medicine

## 2012-06-01 DIAGNOSIS — Z0289 Encounter for other administrative examinations: Secondary | ICD-10-CM

## 2012-06-02 ENCOUNTER — Telehealth: Payer: Self-pay | Admitting: Internal Medicine

## 2012-06-02 MED ORDER — PREDNISONE 20 MG PO TABS: 20.0000 mg | ORAL_TABLET | Freq: Every day | ORAL | Status: DC

## 2012-06-02 NOTE — Telephone Encounter (Signed)
I spoke with mother and is aware of CDY recs. She voiced her understanding and rx has been sent. Nothing further was needed

## 2012-06-02 NOTE — Telephone Encounter (Signed)
Spoke with Okey Regal, states x2 weeks patient has been having chest cold, dry cough, able to bring up a colorless mucus at times.  Feels like she is having an "asthma flare up." Has been using nebulizer with little improvement.  Requesting Dr. Sinclair Ship rec's.  Please advise, thank you!

## 2012-06-02 NOTE — Telephone Encounter (Signed)
Per CY-give patient Prednisone 20 mg #7 take 1 po qd no refills.

## 2012-06-15 ENCOUNTER — Telehealth: Payer: Self-pay | Admitting: Internal Medicine

## 2012-06-15 NOTE — Telephone Encounter (Signed)
Per CY-OV with him or TP when able.

## 2012-06-15 NOTE — Telephone Encounter (Signed)
Called, spoke with pt's mother.  We have scheduled pt to see TP on Tuesday, Feb 4 at 9:30 am.  Mother aware of appt date, time, and location and aware we will arrange for interpretor.  She verbalized understanding and voiced no further questions or concerns at this time.  I spoke with Nicholos Johns.  She will call to make sure interpretor is scheduled considering appt date and time is < 24 hours away.

## 2012-06-15 NOTE — Telephone Encounter (Signed)
Called, spoke with pt's mother.  Reports pt has been coughing for over 3 wks and having "asthma attacks" over the past 2 wks.  States pt finished the prednisone 20 mg qd x 7 day rx given on 06/02/12 by CDY but cough has worsened.  Reports cough is prod with clear phlegm.  Also reports pt does have SOB and wheezing.  No chest tightness, chest pain, f/c/s, or body aches.  States pt's father passed away on January 15, 2024with pna, and pt thinks about this.  Requesting OV as prednisone rx did not help.  Dr. Maple Hudson, pls advise if pt can be worked in.  Thank you.  [facility]  Allergies  Allergen Reactions  . Other     Dog, cat, nuts, grass, trees

## 2012-06-16 ENCOUNTER — Encounter: Payer: Self-pay | Admitting: Adult Health

## 2012-06-16 ENCOUNTER — Ambulatory Visit (INDEPENDENT_AMBULATORY_CARE_PROVIDER_SITE_OTHER): Payer: Medicare Other | Admitting: Adult Health

## 2012-06-16 ENCOUNTER — Ambulatory Visit (INDEPENDENT_AMBULATORY_CARE_PROVIDER_SITE_OTHER)
Admission: RE | Admit: 2012-06-16 | Discharge: 2012-06-16 | Disposition: A | Discharge status: Home / Self Care | Payer: Medicare Other | Source: Ambulatory Visit | Attending: Adult Health | Admitting: Adult Health

## 2012-06-16 VITALS — BP 138/80 | HR 86 | Temp 97.9°F | Ht 65.0 in | Wt 207.6 lb

## 2012-06-16 DIAGNOSIS — J209 Acute bronchitis, unspecified: Secondary | ICD-10-CM

## 2012-06-16 DIAGNOSIS — R05 Cough: Secondary | ICD-10-CM

## 2012-06-16 DIAGNOSIS — R059 Cough, unspecified: Secondary | ICD-10-CM

## 2012-06-16 MED ORDER — HYDROCODONE-HOMATROPINE 5-1.5 MG/5ML PO SYRP: 5.0000 mL | ORAL_SOLUTION | Freq: Four times a day (QID) | ORAL | Status: DC | PRN

## 2012-06-16 MED ORDER — AMOXICILLIN-POT CLAVULANATE 875-125 MG PO TABS: 1.0000 | ORAL_TABLET | Freq: Two times a day (BID) | ORAL | Status: AC

## 2012-06-16 NOTE — Progress Notes (Signed)
52 yoFnever smoker, self-referred for help with snoring. I had seen her for asthma as a teenager.         Mother here Deaf since meningitis as an infant, but able to hear snoring which wakes her. Wakes frequently. No sleep walking or movement disorder, but gets up to bathroom or to eat repeatedly. Then tired next day and naps. Mother has OSA. Chronic allergic rhinitis and asthma. Has been on allergy shots from Dr Velora Heckler since age 52. Needs rescue inhaler. Bedtime around 9:30 PM but has difficulty initiating and maintaining sleep. Easily disturbed. May try to sleep as late as 10:30 in the morning. Cough and throat tickle blamed on allergic rhinitis with postnasal drip. Takes Nasalcrom. No ENT surgery.  10/11/11- 52 yoF never smoker, deaf,  self-referred for help with snoring. I had seen her for allergic asthma as a teenager.          Mother here. She had been on allergy shots for many years and finally stopped last week. They describe coughing fits. Over-the-counter cough syrups did not help. Eyes itch. Some sneezing. Occasional use of rescue inhaler.  OSA-unattended home sleep study on 08/27/2011 confirmed mild obstructive sleep apnea, AHI of 14.1 per hour with moderate snoring and oxygen desaturation to 81%. She returns now after CPAP autotitration. Good control and compliance. Titrated to a best pressure range of 10.4. She is tolerating initial CPAP pretty well. Today we discussed alternatives and how to work with the home care company ([facility]).  11/22/11- 52 yoF never smoker, deaf,  Followed for OSA/ CPAP. I had seen her for allergic asthma as a teenager. Mother here. Wears CPAP every night-still has cool feelings from pressure in machine; lower pressure to 5.0 as it was feeling like too much. CPAP 11/ [facility]. I think she had turned a humidifier or the ramp, thinking she was changing the pressure. Cough is productive of scant clear sputum with no wheezing noted by mother. Has used rescue inhaler a  few times but has not needed her nebulizer machine.  02/24/12- 52 yoF never smoker, deaf,  Followed for OSA/ CPAP               Mother here- signing translator Pt states she is doing okay with Dulera; new sleep study scheduled for 03-17-12. Her original study was an unattended home study done because she is deaf. Her CPAP had been picked up by DME because of poor compliance and insurance requires update sleep study documenting she needs it. She says she was afraid of it so she would take it off after a couple of hours every night. We're giving a booklet and discussing again the medical issues.  04/21/12- 52 yoF never smoker, deaf,  Followed for OSA/ CPAP    Here w/ boy friend and signing translator FOLLOWS FOR: review sleep study with patient from 03-17-12 NPSG 03/17/12- AHI  2.5/ hr, WNL. Normal oxygenation.  I discussed normal range of respiratory events as being less than or equal to 5 per hour. She says the biggest sleep problem at home is that her parents keep the house much too hot. She does not need CPAP.   06/16/2012  51 yoF never smoker, deaf,  Mild intermittent asthma  Complains of prod cough with white mucus x3weeks  Finished prednisone by CY with no improvement.   Here with signing translator Patient denies any hemoptysis, chest pain, orthopnea, PND, or leg swelling. Patient has not been using any over-the-counter products for cough and congestion. She  says that her mucus is very thick, causing her to cough quite often. Does interfere with her sleep. Previous sleep study neg for OSA .   ROS-see HPI Constitutional:   No-   weight loss, night sweats, fevers, chills,+ fatigue, lassitude. HEENT:   No-  headaches, difficulty swallowing, tooth/dental problems, sore throat, She is deaf- reads lips and signs.     No- sneezing, itching, ear ache, +nasal congestion, post nasal drip,  CV:  No-   chest pain, orthopnea, PND, swelling in lower extremities, anasarca, dizziness, palpitations Resp:  No-   shortness of breath with exertion or at rest.             Skin: No-   rash or lesions. GI:  No-   heartburn, indigestion, abdominal pain, nausea, vomiting,  GU:  MS:  No-   joint pain or swelling.   Neuro-     nothing unusual Psych:  No- change in mood or affect. No depression or anxiety.  No memory loss.     OBJ- Physical Exam GEN: A/Ox3; pleasant , NAD, well nourished   HEENT:  [place]/AT,  EACs-clear, TMs-wnl, NOSE-clear, THROAT-clear, no lesions, no postnasal drip or exudate noted.   NECK:  Supple w/ fair ROM; no JVD; normal carotid impulses w/o bruits; no thyromegaly or nodules palpated; no lymphadenopathy.  RESP  Few rhonchi , no wheezing no accessory muscle use, no dullness to percussion  CARD:  RRR, no m/r/g  , no peripheral edema, pulses intact, no cyanosis or clubbing.  GI:   Soft & nt; nml bowel sounds; no organomegaly or masses detected.  Musco: Warm bil, no deformities or joint swelling noted.   Neuro: alert, no focal deficits noted.    Skin: Warm, no lesions or rashes

## 2012-06-16 NOTE — Assessment & Plan Note (Signed)
Flare unresponsive to steroids  Check xray today   Plan  Augmentin 875mg  Twice daily  For 7 days  Mucinex DM Twice daily  As needed  Cough/congestion  Hydromet 1-2 tsp every 4-6 hr As needed   Cough-may make you sleepy.  I will call with xray results.

## 2012-06-16 NOTE — Patient Instructions (Addendum)
Augmentin 875mg  Twice daily  For 7 days  Mucinex DM Twice daily  As needed  Cough/congestion  Hydromet 1-2 tsp every 4-6 hr As needed   Cough-may make you sleepy.  I will call with xray results.  Please contact office for sooner follow up if symptoms do not improve or worsen or seek emergency care

## 2012-06-18 DIAGNOSIS — F209 Schizophrenia, unspecified: Secondary | ICD-10-CM | POA: Diagnosis not present

## 2012-06-18 DIAGNOSIS — R7301 Impaired fasting glucose: Secondary | ICD-10-CM | POA: Diagnosis not present

## 2012-06-18 DIAGNOSIS — E039 Hypothyroidism, unspecified: Secondary | ICD-10-CM | POA: Diagnosis not present

## 2012-06-18 DIAGNOSIS — E785 Hyperlipidemia, unspecified: Secondary | ICD-10-CM | POA: Diagnosis not present

## 2012-06-19 NOTE — Progress Notes (Signed)
Quick Note:  Called spoke with patient's mother, advised of cxr results / recs as stated by TP. Pt's mother verbalized her understanding and denied any questions. Xray results forwarded to pt's PCP. ______

## 2012-07-22 DIAGNOSIS — F259 Schizoaffective disorder, unspecified: Secondary | ICD-10-CM | POA: Diagnosis not present

## 2012-08-10 ENCOUNTER — Telehealth: Payer: Self-pay | Admitting: Internal Medicine

## 2012-08-10 MED ORDER — DOXYCYCLINE HYCLATE 100 MG PO TABS: ORAL_TABLET | ORAL | Status: DC

## 2012-08-10 NOTE — Telephone Encounter (Signed)
Spoke with pt's mother and notified of recs per CDY She verbalized understanding and states nothing further needed

## 2012-08-10 NOTE — Telephone Encounter (Signed)
Last OV 06-16-12. Per pt mother pt is c/o having sinus pressure and pain, runny nose, sneezing, watery eyes all x 3 days. Pt has been taking Claritin as prescribed by CY without relief. Pt denies any cough, wheezing, or SOB.  Please advise. Carron Curie, CMA Allergies  Allergen Reactions  . Other     Dog, cat, nuts, grass, trees

## 2012-08-10 NOTE — Telephone Encounter (Signed)
Pt's mom called back again re: same. Says pt is "really miserable". Hazel Sams

## 2012-08-10 NOTE — Telephone Encounter (Signed)
Per CY-Allegra D 12 hour from pharmacy counter.

## 2012-08-10 NOTE — Telephone Encounter (Signed)
Spoke with pt's mother and notified of recs per CDY She verbalized understanding and states nothing further needed Rx was sent to [facility]

## 2012-08-10 NOTE — Telephone Encounter (Signed)
Per CY-lets give patient Doxycyline 100 mg #8 take 2 today then 1 daily no refills.

## 2012-08-10 NOTE — Telephone Encounter (Signed)
Spoke with pt's mother She had called in earlier about the pt having some nasal drainage that was clear We had advised allegra d 12 hr Since then, she has noticed the nasal d/c has become yellow and thick Wants to know if she will require abx Please advise thanks! Allergies  Allergen Reactions  . Other     Dog, cat, nuts, grass, trees

## 2012-09-01 DIAGNOSIS — R5381 Other malaise: Secondary | ICD-10-CM | POA: Diagnosis not present

## 2012-09-01 DIAGNOSIS — G609 Hereditary and idiopathic neuropathy, unspecified: Secondary | ICD-10-CM | POA: Diagnosis not present

## 2012-09-01 DIAGNOSIS — R5383 Other fatigue: Secondary | ICD-10-CM | POA: Diagnosis not present

## 2012-09-01 DIAGNOSIS — F209 Schizophrenia, unspecified: Secondary | ICD-10-CM | POA: Diagnosis not present

## 2012-09-01 DIAGNOSIS — R030 Elevated blood-pressure reading, without diagnosis of hypertension: Secondary | ICD-10-CM | POA: Diagnosis not present

## 2012-09-01 DIAGNOSIS — E559 Vitamin D deficiency, unspecified: Secondary | ICD-10-CM | POA: Diagnosis not present

## 2012-09-01 DIAGNOSIS — E039 Hypothyroidism, unspecified: Secondary | ICD-10-CM | POA: Diagnosis not present

## 2012-09-01 DIAGNOSIS — R7301 Impaired fasting glucose: Secondary | ICD-10-CM | POA: Diagnosis not present

## 2012-09-21 ENCOUNTER — Encounter: Payer: Self-pay | Admitting: Internal Medicine

## 2012-09-21 ENCOUNTER — Telehealth: Payer: Self-pay | Admitting: Internal Medicine

## 2012-09-21 ENCOUNTER — Ambulatory Visit (INDEPENDENT_AMBULATORY_CARE_PROVIDER_SITE_OTHER): Payer: Medicare Other | Admitting: Internal Medicine

## 2012-09-21 VITALS — BP 122/78 | HR 100 | Ht 64.75 in | Wt 201.6 lb

## 2012-09-21 DIAGNOSIS — J209 Acute bronchitis, unspecified: Secondary | ICD-10-CM

## 2012-09-21 DIAGNOSIS — J309 Allergic rhinitis, unspecified: Secondary | ICD-10-CM

## 2012-09-21 MED ORDER — FLUTICASONE PROPIONATE 50 MCG/ACT NA SUSP: 2.0000 | Freq: Every day | NASAL | Status: DC

## 2012-09-21 MED ORDER — METHYLPREDNISOLONE ACETATE 80 MG/ML IJ SUSP
80.0000 mg | Freq: Once | INTRAMUSCULAR | Status: AC
  Administered 2012-09-21: 80 mg via INTRAMUSCULAR

## 2012-09-21 NOTE — Progress Notes (Signed)
52 yoFnever smoker, self-referred for help with snoring. I had seen her for asthma as a teenager.         Mother here Deaf since meningitis as an infant, but able to hear snoring which wakes her. Wakes frequently. No sleep walking or movement disorder, but gets up to bathroom or to eat repeatedly. Then tired next day and naps. Mother has OSA. Chronic allergic rhinitis and asthma. Has been on allergy shots from Dr Velora Heckler since age 52. Needs rescue inhaler. Bedtime around 9:30 PM but has difficulty initiating and maintaining sleep. Easily disturbed. May try to sleep as late as 10:30 in the morning. Cough and throat tickle blamed on allergic rhinitis with postnasal drip. Takes Nasalcrom. No ENT surgery.  10/11/11- 52 yoF never smoker, deaf,  self-referred for help with snoring. I had seen her for allergic asthma as a teenager.          Mother here. She had been on allergy shots for many years and finally stopped last week. They describe coughing fits. Over-the-counter cough syrups did not help. Eyes itch. Some sneezing. Occasional use of rescue inhaler.  OSA-unattended home sleep study on 08/27/2011 confirmed mild obstructive sleep apnea, AHI of 14.1 per hour with moderate snoring and oxygen desaturation to 81%. She returns now after CPAP autotitration. Good control and compliance. Titrated to a best pressure range of 10.4. She is tolerating initial CPAP pretty well. Today we discussed alternatives and how to work with the home care company ([facility]).  11/22/11- 52 yoF never smoker, deaf,  Followed for OSA/ CPAP. I had seen her for allergic asthma as a teenager. Mother here. Wears CPAP every night-still has cool feelings from pressure in machine; lower pressure to 5.0 as it was feeling like too much. CPAP 11/ [facility]. I think she had turned a humidifier or the ramp, thinking she was changing the pressure. Cough is productive of scant clear sputum with no wheezing noted by mother. Has used rescue inhaler a  few times but has not needed her nebulizer machine.  02/24/12- 52 yoF never smoker, deaf,  Followed for OSA/ CPAP               Mother here- signing translator Pt states she is doing okay with Dulera; new sleep study scheduled for 03-17-12. Her original study was an unattended home study done because she is deaf. Her CPAP had been picked up by DME because of poor compliance and insurance requires update sleep study documenting she needs it. She says she was afraid of it so she would take it off after a couple of hours every night. We're giving a booklet and discussing again the medical issues.  04/21/12- 52 yoF never smoker, deaf,  Followed for OSA/ CPAP    Here w/ boy friend and signing translator FOLLOWS FOR: review sleep study with patient from 03-17-12 NPSG 03/17/12- AHI  2.5/ hr, WNL. Normal oxygenation.  I discussed normal range of respiratory events as being less than or equal to 5 per hour. She says the biggest sleep problem at home is that her parents keep the house much too hot. She does not need CPAP.   06/16/2012  51 yoF never smoker, deaf,  Mild intermittent asthma  Complains of prod cough with white mucus x3weeks  Finished prednisone by CY with no improvement.   Here with signing translator Patient denies any hemoptysis, chest pain, orthopnea, PND, or leg swelling. Patient has not been using any over-the-counter products for cough and congestion. She  says that her mucus is very thick, causing her to cough quite often. Does interfere with her sleep. Previous sleep study neg for OSA .   09/21/12- 52 yoF never smoker, deaf,  Mild intermittent asthma    Signing translator here ACUTE VISIT: started Friday with symptoms-pollen got in nose; by the afternoon had drainage. cough started Saturday and making her feel worse. Felt worse today and tried Advil for pain and only slight relief. Overall feels bad. She does not feel she has a cold. Has been out of nasal spray.  ROS-see  HPI Constitutional:   No-   weight loss, night sweats, fevers, chills,+ fatigue, lassitude. HEENT:   No-  headaches, difficulty swallowing, tooth/dental problems, sore throat, She is deaf- reads lips and signs.     + sneezing, itching, ear ache, +nasal congestion, post nasal drip,  CV:  No-   chest pain, orthopnea, PND, swelling in lower extremities, anasarca, dizziness, palpitations Resp: No-   shortness of breath with exertion or at rest.             Skin: No-   rash or lesions. GI:  No-   heartburn, indigestion, abdominal pain, nausea, vomiting,  GU:  MS:  No-   joint pain or swelling.   Neuro-     nothing unusual Psych:  No- change in mood or affect. No depression or anxiety.  No memory loss.   OBJ- Physical Exam General- Alert, Oriented, Affect-appropriate, Distress- none acute Skin- rash-none, lesions- none, excoriation- none Lymphadenopathy- none Head- atraumatic            Eyes- Gross vision intact, PERRLA, conjunctivae and secretions clear            Ears- deaf            Nose- Clear, no-Septal dev, mucus, polyps, erosion, perforation             Throat- Mallampati II , mucosa clear , drainage- none, tonsils- atrophic Neck- flexible , trachea midline, no stridor , thyroid nl, carotid no bruit Chest - symmetrical excursion , unlabored           Heart/CV- RRR , no murmur , no gallop  , no rub, nl s1 s2                           - JVD- none , edema- none, stasis changes- none, varices- none           Lung- clear to P&A, wheeze- none, cough+raspy , dullness-none, rub- none           Chest wall-  Abd- Br/ Gen/ Rectal- Not done, not indicated Extrem- cyanosis- none, clubbing, none, atrophy- none, strength- nl Neuro- grossly intact to observation

## 2012-09-21 NOTE — Telephone Encounter (Signed)
I spoke with pt mother. She stated pt c/o cough w/ clear phlem, she is vomiting phlem, nasal congestion, runny nose, wheezing, chest tx x Friday. Pt is taking OTC Claritin D. Mother is requesting to have daughter worked in today. TP is not here. Please advise Dr. Maple Hudson thanks Last OV 06/16/12 Pending none Allergies  Allergen Reactions  . Other     Dog, cat, nuts, grass, trees

## 2012-09-21 NOTE — Telephone Encounter (Signed)
Per CY-lets work patient in today; we had 1:45pm slot open-spoke with caller on file and aware of appointment. Pt is deaf and will need interpretor which has been scheduled.

## 2012-09-21 NOTE — Patient Instructions (Addendum)
Depo 80  Continue using your regular medicines at home, including the nebulizer as needed.  Script for fluticasone nasal spray sent to drug store  Please call as needed

## 2012-10-02 NOTE — Assessment & Plan Note (Signed)
Acute tracheobronchitis. She does not think this is a cold Plan-depoMedrol, fluids, symptomatic therapy

## 2012-10-02 NOTE — Assessment & Plan Note (Signed)
Exacerbation seasonal rhinitis Plan-Depo-Medrol

## 2012-10-06 ENCOUNTER — Encounter: Payer: Self-pay | Admitting: Internal Medicine

## 2012-10-06 ENCOUNTER — Ambulatory Visit (INDEPENDENT_AMBULATORY_CARE_PROVIDER_SITE_OTHER): Payer: Medicare Other | Admitting: Internal Medicine

## 2012-10-06 ENCOUNTER — Ambulatory Visit (INDEPENDENT_AMBULATORY_CARE_PROVIDER_SITE_OTHER)
Admission: RE | Admit: 2012-10-06 | Discharge: 2012-10-06 | Disposition: A | Discharge status: Home / Self Care | Payer: Medicare Other | Source: Ambulatory Visit | Attending: Internal Medicine | Admitting: Internal Medicine

## 2012-10-06 ENCOUNTER — Telehealth: Payer: Self-pay | Admitting: Internal Medicine

## 2012-10-06 ENCOUNTER — Other Ambulatory Visit: Payer: Medicare Other

## 2012-10-06 VITALS — BP 112/74 | HR 82 | Ht 64.75 in | Wt 202.0 lb

## 2012-10-06 DIAGNOSIS — J209 Acute bronchitis, unspecified: Secondary | ICD-10-CM

## 2012-10-06 DIAGNOSIS — R05 Cough: Secondary | ICD-10-CM | POA: Diagnosis not present

## 2012-10-06 DIAGNOSIS — J309 Allergic rhinitis, unspecified: Secondary | ICD-10-CM | POA: Diagnosis not present

## 2012-10-06 DIAGNOSIS — J45909 Unspecified asthma, uncomplicated: Secondary | ICD-10-CM

## 2012-10-06 MED ORDER — METHYLPREDNISOLONE 8 MG PO TABS: ORAL_TABLET | ORAL | Status: DC

## 2012-10-06 MED ORDER — OMEPRAZOLE 20 MG PO CPDR: 20.0000 mg | DELAYED_RELEASE_CAPSULE | Freq: Every day | ORAL | Status: DC

## 2012-10-06 NOTE — Progress Notes (Signed)
71 yoFnever smoker, self-referred for help with snoring. I had seen her for asthma as a teenager.         Mother here Deaf since meningitis as an infant, but able to hear snoring which wakes her. Wakes frequently. No sleep walking or movement disorder, but gets up to bathroom or to eat repeatedly. Then tired next day and naps. Mother has OSA. Chronic allergic rhinitis and asthma. Has been on allergy shots from Dr Velora Heckler since age 52. Needs rescue inhaler. Bedtime around 9:30 PM but has difficulty initiating and maintaining sleep. Easily disturbed. May try to sleep as late as 10:30 in the morning. Cough and throat tickle blamed on allergic rhinitis with postnasal drip. Takes Nasalcrom. No ENT surgery.  10/11/11- 59 yoF never smoker, deaf,  self-referred for help with snoring. I had seen her for allergic asthma as a teenager.          Mother here. She had been on allergy shots for many years and finally stopped last week. They describe coughing fits. Over-the-counter cough syrups did not help. Eyes itch. Some sneezing. Occasional use of rescue inhaler.  OSA-unattended home sleep study on 08/27/2011 confirmed mild obstructive sleep apnea, AHI of 14.1 per hour with moderate snoring and oxygen desaturation to 81%. She returns now after CPAP autotitration. Good control and compliance. Titrated to a best pressure range of 10.4. She is tolerating initial CPAP pretty well. Today we discussed alternatives and how to work with the home care company ([facility]).  11/22/11- 34 yoF never smoker, deaf,  Followed for OSA/ CPAP. I had seen her for allergic asthma as a teenager. Mother here. Wears CPAP every night-still has cool feelings from pressure in machine; lower pressure to 5.0 as it was feeling like too much. CPAP 11/ [facility]. I think she had turned a humidifier or the ramp, thinking she was changing the pressure. Cough is productive of scant clear sputum with no wheezing noted by mother. Has used rescue inhaler a  few times but has not needed her nebulizer machine.  02/24/12- 81 yoF never smoker, deaf,  Followed for OSA/ CPAP               Mother here- signing translator Pt states she is doing okay with Dulera; new sleep study scheduled for 03-17-12. Her original study was an unattended home study done because she is deaf. Her CPAP had been picked up by DME because of poor compliance and insurance requires update sleep study documenting she needs it. She says she was afraid of it so she would take it off after a couple of hours every night. We're giving a booklet and discussing again the medical issues.  04/21/12- 54 yoF never smoker, deaf,  Followed for OSA/ CPAP    Here w/ boy friend and signing translator FOLLOWS FOR: review sleep study with patient from 03-17-12 NPSG 03/17/12- AHI  2.5/ hr, WNL. Normal oxygenation.  I discussed normal range of respiratory events as being less than or equal to 5 per hour. She says the biggest sleep problem at home is that her parents keep the house much too hot. She does not need CPAP.   06/16/2012  51 yoF never smoker, deaf,  Mild intermittent asthma  Complains of prod cough with white mucus x3weeks  Finished prednisone by CY with no improvement.   Here with signing translator Patient denies any hemoptysis, chest pain, orthopnea, PND, or leg swelling. Patient has not been using any over-the-counter products for cough and congestion. She  says that her mucus is very thick, causing her to cough quite often. Does interfere with her sleep. Previous sleep study neg for OSA .   09/21/12- 52 yoF never smoker, deaf,  Mild intermittent asthma    Signing translator  here ACUTE VISIT: started Friday with symptoms-pollen got in nose; by the afternoon had drainage. cough started Saturday and making her feel worse. Felt worse today and tried Advil for pain and only slight relief. Overall feels bad. She does not feel she has a cold. Has been out of nasal spray.  10/06/12-- 52 yoF never  smoker, deaf,  Mild intermittent asthma    Psychologist, forensic and Mother here ACUTE VISIT: still having a cough (alot) since last visit; clear in color; can't tell that the Depo injection form 09-21-12 helped at all. Chest is sore some per patient. Nasal drainage-clear in color. Persistent cough now for 3 weeks. They're anxious because father died of pneumonia. She is coughing clear mucus with no fever. Admits some heartburn and shortness of breath. We discussed reflux.  She and her mother are interested in restarting allergy vaccine. CXR 06/19/12 IMPRESSION: Several vertebral bodies show mild endplate concavity.  Question a degree of osteoporosis. No edema or consolidation.  Original Report Authenticated By: Bretta Bang, M.D.  ROS-see HPI Constitutional:   No-   weight loss, night sweats, fevers, chills,+ fatigue, lassitude. HEENT:   No-  headaches, difficulty swallowing, tooth/dental problems, sore throat, She is deaf- reads lips and signs.     + sneezing, itching, ear ache, +nasal congestion, post nasal drip,  CV:  No-   chest pain, orthopnea, PND, swelling in lower extremities, anasarca, dizziness, palpitations Resp: No-   shortness of breath with exertion or at rest.  + nonproductive cough Skin: No-   rash or lesions. GI:  + heartburn, indigestion, abdominal pain, nausea, vomiting,  GU:  MS:  No-   joint pain or swelling.   Neuro-     nothing unusual Psych:  No- change in mood or affect. No depression or anxiety.  No memory loss.   OBJ- Physical Exam General- Alert, Oriented, Affect-appropriate, Distress- none acute Skin- rash-none, lesions- none, excoriation- none Lymphadenopathy- none Head- atraumatic            Eyes- Gross vision intact, PERRLA, conjunctivae and secretions clear            Ears- deaf            Nose- Clear, no-Septal dev, mucus, polyps, erosion, perforation             Throat- Mallampati II , mucosa clear , drainage- none, tonsils- atrophic Neck- flexible ,  trachea midline, no stridor , thyroid nl, carotid no bruit Chest - symmetrical excursion , unlabored           Heart/CV- RRR , no murmur , no gallop  , no rub, nl s1 s2                           - JVD- none , edema- none, stasis changes- none, varices- none           Lung- clear to P&A, wheeze- none, cough+raspy , dullness-none, rub- none           Chest wall-  Abd- Br/ Gen/ Rectal- Not done, not indicated Extrem- cyanosis- none, clubbing, none, atrophy- none, strength- nl Neuro- grossly intact to observation

## 2012-10-06 NOTE — Telephone Encounter (Signed)
Called spoke with pt's mother Okey Regal She reports Kerilyn's cough has worsened x1 week > "she just coughs coughs coughs" Prod cough with clear mucus, sometimes violently enough to make herself vomit Wheezing, dyspnea Denies chest tightness, f/c/s Okey Regal worried pt may have PNA CY with cancellation this afternoon at 3pm >> appt scheduled Nothing further needed at this time; will sign off

## 2012-10-06 NOTE — Patient Instructions (Addendum)
Script sent for methylprednisolone/ Medrol to try instead of prednisone  Script sent for omeprazole  Order- lab- Allergy Profile                  Dx asthma with bronchitis  Order- CXR-   Keep appointment in November unless needed sooner

## 2012-10-07 LAB — ALLERGY FULL PROFILE
Allergen, D pternoyssinus,d7: <0.1 kU/L
Allergen,Goose feathers, e70: <0.1 kU/L
Alternaria Alternata: <0.1 kU/L
Aspergillus fumigatus, m3: <0.1 kU/L
Bermuda Grass: 0.16 kU/L — ABNORMAL HIGH
Candida Albicans: 0.93 kU/L — ABNORMAL HIGH
Common Ragweed: 0.19 kU/L — ABNORMAL HIGH
D. farinae: <0.1 kU/L
Dog Dander: 0.57 kU/L — ABNORMAL HIGH
Goldenrod: <0.1 kU/L
Helminthosporium halodes: <0.1 kU/L
House Dust Hollister: 0.28 kU/L — ABNORMAL HIGH
IgE (Immunoglobulin E), Serum: 56.4 IU/mL (ref 0.0–180.0)
Lamb's Quarters: 0.15 kU/L — ABNORMAL HIGH
Plantain: <0.1 kU/L
Stemphylium Botryosum: <0.1 kU/L

## 2012-10-09 ENCOUNTER — Telehealth: Payer: Self-pay | Admitting: Internal Medicine

## 2012-10-09 NOTE — Telephone Encounter (Signed)
Result Note    CXR- lungs are clear. This is bronchitis, not pneumonia   Result Note    Elevated allergy antibody levels to a number of common triggers, including cat, dog, house dust, tree/ gras and weed pollens.   Allergy shots would be an option, but she would need to come here to get them. We aren't able to let her give her own any more. We can discuss at next ov.  ----  Mother aware of results

## 2012-10-12 ENCOUNTER — Telehealth: Payer: Self-pay | Admitting: Internal Medicine

## 2012-10-12 NOTE — Telephone Encounter (Signed)
Spoke with Carol-pt would be able to do this. Yes, let's go ahead and restart.

## 2012-10-12 NOTE — Telephone Encounter (Signed)
I can restart allergy vaccine based on her old skin testing and recent blood test. They would need to be able to get her here twice weekly x 4 months and then once weekly long term for allergy shots. Can they do that?

## 2012-10-12 NOTE — Telephone Encounter (Signed)
I spoke with mother and she stated pt cough is no better. She is having a deep, hacking cough, getting up clear phlem, SOB w/ exertion. Not sure wbout chest tx or wheezing. The steroid per mother did nothing for the pt. She thinks it may be time to start allergy vaccines. Please advise Dr. Maple Hudson thanks  Pending appt 03/24/13  Allergies  Allergen Reactions  . Other     Dog, cat, nuts, grass, trees

## 2012-10-14 ENCOUNTER — Telehealth: Payer: Self-pay | Admitting: Internal Medicine

## 2012-10-14 NOTE — Telephone Encounter (Signed)
Chart and message placed on CY's cart for review.

## 2012-10-14 NOTE — Telephone Encounter (Signed)
Persistent cough. Heidi Good and mother want to restart allergy vaccine We can base on Allergy Profile from 10/06/12 as discussed. Previous skin testing and vaccine from Dr Delano Metz in past with benefit then, no problems. Educated on risk/ goals/ options.  Starting vaccine for build-up here per protocol.

## 2012-10-14 NOTE — Telephone Encounter (Signed)
Please pull old chart, looking for old allergy skin test results.

## 2012-10-14 NOTE — Telephone Encounter (Signed)
Synetta Fail in HIM is pulling chart for me. Will wait for it then find records for CY.

## 2012-10-15 ENCOUNTER — Ambulatory Visit (INDEPENDENT_AMBULATORY_CARE_PROVIDER_SITE_OTHER): Payer: Medicare Other

## 2012-10-15 DIAGNOSIS — J309 Allergic rhinitis, unspecified: Secondary | ICD-10-CM

## 2012-10-16 DIAGNOSIS — L84 Corns and callosities: Secondary | ICD-10-CM | POA: Diagnosis not present

## 2012-10-17 NOTE — Assessment & Plan Note (Signed)
Persistent cough may be related to her asthma. We have discussed reflux. We discussed use of Medrol with particular attention to her diabetes.

## 2012-10-17 NOTE — Assessment & Plan Note (Signed)
She and her mother are interested in restarting allergy vaccine. Cough, probably related to reflux, is bothering her more than nasal symptoms now in pollen season.

## 2012-10-19 DIAGNOSIS — F259 Schizoaffective disorder, unspecified: Secondary | ICD-10-CM | POA: Diagnosis not present

## 2012-10-19 NOTE — Telephone Encounter (Signed)
I called and spoke with pt.'s mother today. I explained to her about build up protocol. They are going to try Tues. & Thurs. For now. I also gave her your suggestion of coming in once a wk. Since they live a distance away.Mardene Sayer) Kareema will be in tomorrow for her first shot.

## 2012-10-20 ENCOUNTER — Ambulatory Visit (INDEPENDENT_AMBULATORY_CARE_PROVIDER_SITE_OTHER): Payer: Medicare Other

## 2012-10-20 DIAGNOSIS — J309 Allergic rhinitis, unspecified: Secondary | ICD-10-CM

## 2012-10-23 ENCOUNTER — Ambulatory Visit (INDEPENDENT_AMBULATORY_CARE_PROVIDER_SITE_OTHER): Payer: Medicare Other

## 2012-10-23 DIAGNOSIS — J309 Allergic rhinitis, unspecified: Secondary | ICD-10-CM

## 2012-10-27 ENCOUNTER — Ambulatory Visit (INDEPENDENT_AMBULATORY_CARE_PROVIDER_SITE_OTHER): Payer: Medicare Other

## 2012-10-27 DIAGNOSIS — J309 Allergic rhinitis, unspecified: Secondary | ICD-10-CM | POA: Diagnosis not present

## 2012-10-30 ENCOUNTER — Ambulatory Visit (INDEPENDENT_AMBULATORY_CARE_PROVIDER_SITE_OTHER): Payer: Medicare Other

## 2012-10-30 DIAGNOSIS — J309 Allergic rhinitis, unspecified: Secondary | ICD-10-CM

## 2012-11-03 ENCOUNTER — Ambulatory Visit (INDEPENDENT_AMBULATORY_CARE_PROVIDER_SITE_OTHER): Payer: Medicare Other

## 2012-11-03 DIAGNOSIS — J309 Allergic rhinitis, unspecified: Secondary | ICD-10-CM | POA: Diagnosis not present

## 2012-11-06 ENCOUNTER — Ambulatory Visit (INDEPENDENT_AMBULATORY_CARE_PROVIDER_SITE_OTHER): Payer: Medicare Other

## 2012-11-06 DIAGNOSIS — J309 Allergic rhinitis, unspecified: Secondary | ICD-10-CM | POA: Diagnosis not present

## 2012-11-09 DIAGNOSIS — F259 Schizoaffective disorder, unspecified: Secondary | ICD-10-CM | POA: Diagnosis not present

## 2012-11-10 ENCOUNTER — Ambulatory Visit (INDEPENDENT_AMBULATORY_CARE_PROVIDER_SITE_OTHER): Payer: Medicare Other

## 2012-11-10 DIAGNOSIS — J309 Allergic rhinitis, unspecified: Secondary | ICD-10-CM | POA: Diagnosis not present

## 2012-11-17 ENCOUNTER — Ambulatory Visit (INDEPENDENT_AMBULATORY_CARE_PROVIDER_SITE_OTHER): Payer: Medicare Other

## 2012-11-17 DIAGNOSIS — J309 Allergic rhinitis, unspecified: Secondary | ICD-10-CM | POA: Diagnosis not present

## 2012-11-20 ENCOUNTER — Ambulatory Visit (INDEPENDENT_AMBULATORY_CARE_PROVIDER_SITE_OTHER): Payer: Medicare Other

## 2012-11-20 DIAGNOSIS — J309 Allergic rhinitis, unspecified: Secondary | ICD-10-CM | POA: Diagnosis not present

## 2012-11-23 ENCOUNTER — Ambulatory Visit (INDEPENDENT_AMBULATORY_CARE_PROVIDER_SITE_OTHER): Payer: Medicare Other

## 2012-11-23 DIAGNOSIS — J309 Allergic rhinitis, unspecified: Secondary | ICD-10-CM

## 2012-11-24 ENCOUNTER — Ambulatory Visit (INDEPENDENT_AMBULATORY_CARE_PROVIDER_SITE_OTHER): Payer: Medicare Other

## 2012-11-24 DIAGNOSIS — J309 Allergic rhinitis, unspecified: Secondary | ICD-10-CM

## 2012-11-27 ENCOUNTER — Ambulatory Visit (INDEPENDENT_AMBULATORY_CARE_PROVIDER_SITE_OTHER): Payer: Medicare Other

## 2012-11-27 DIAGNOSIS — J309 Allergic rhinitis, unspecified: Secondary | ICD-10-CM | POA: Diagnosis not present

## 2012-12-04 ENCOUNTER — Ambulatory Visit (INDEPENDENT_AMBULATORY_CARE_PROVIDER_SITE_OTHER): Payer: Medicare Other

## 2012-12-04 DIAGNOSIS — J309 Allergic rhinitis, unspecified: Secondary | ICD-10-CM | POA: Diagnosis not present

## 2012-12-07 ENCOUNTER — Ambulatory Visit (INDEPENDENT_AMBULATORY_CARE_PROVIDER_SITE_OTHER): Payer: Medicare Other

## 2012-12-07 DIAGNOSIS — J309 Allergic rhinitis, unspecified: Secondary | ICD-10-CM | POA: Diagnosis not present

## 2012-12-11 ENCOUNTER — Ambulatory Visit (INDEPENDENT_AMBULATORY_CARE_PROVIDER_SITE_OTHER): Payer: Medicare Other

## 2012-12-11 DIAGNOSIS — J309 Allergic rhinitis, unspecified: Secondary | ICD-10-CM | POA: Diagnosis not present

## 2012-12-14 ENCOUNTER — Ambulatory Visit (INDEPENDENT_AMBULATORY_CARE_PROVIDER_SITE_OTHER): Payer: Medicare Other

## 2012-12-14 DIAGNOSIS — J309 Allergic rhinitis, unspecified: Secondary | ICD-10-CM | POA: Diagnosis not present

## 2012-12-15 ENCOUNTER — Ambulatory Visit: Payer: Medicare Other

## 2012-12-18 ENCOUNTER — Ambulatory Visit (INDEPENDENT_AMBULATORY_CARE_PROVIDER_SITE_OTHER): Payer: Medicare Other

## 2012-12-18 DIAGNOSIS — J309 Allergic rhinitis, unspecified: Secondary | ICD-10-CM

## 2012-12-21 ENCOUNTER — Ambulatory Visit: Payer: Medicare Other

## 2012-12-21 ENCOUNTER — Ambulatory Visit (INDEPENDENT_AMBULATORY_CARE_PROVIDER_SITE_OTHER): Payer: Medicare Other

## 2012-12-21 DIAGNOSIS — J309 Allergic rhinitis, unspecified: Secondary | ICD-10-CM | POA: Diagnosis not present

## 2012-12-28 ENCOUNTER — Ambulatory Visit (INDEPENDENT_AMBULATORY_CARE_PROVIDER_SITE_OTHER): Payer: Medicare Other

## 2012-12-28 DIAGNOSIS — J309 Allergic rhinitis, unspecified: Secondary | ICD-10-CM

## 2012-12-31 DIAGNOSIS — E282 Polycystic ovarian syndrome: Secondary | ICD-10-CM | POA: Diagnosis not present

## 2012-12-31 DIAGNOSIS — G609 Hereditary and idiopathic neuropathy, unspecified: Secondary | ICD-10-CM | POA: Diagnosis not present

## 2012-12-31 DIAGNOSIS — E039 Hypothyroidism, unspecified: Secondary | ICD-10-CM | POA: Diagnosis not present

## 2012-12-31 DIAGNOSIS — H9190 Unspecified hearing loss, unspecified ear: Secondary | ICD-10-CM | POA: Diagnosis not present

## 2012-12-31 DIAGNOSIS — E785 Hyperlipidemia, unspecified: Secondary | ICD-10-CM | POA: Diagnosis not present

## 2012-12-31 DIAGNOSIS — R7301 Impaired fasting glucose: Secondary | ICD-10-CM | POA: Diagnosis not present

## 2012-12-31 DIAGNOSIS — F209 Schizophrenia, unspecified: Secondary | ICD-10-CM | POA: Diagnosis not present

## 2012-12-31 DIAGNOSIS — D352 Benign neoplasm of pituitary gland: Secondary | ICD-10-CM | POA: Diagnosis not present

## 2013-01-04 ENCOUNTER — Ambulatory Visit (INDEPENDENT_AMBULATORY_CARE_PROVIDER_SITE_OTHER): Payer: Medicare Other

## 2013-01-04 DIAGNOSIS — J309 Allergic rhinitis, unspecified: Secondary | ICD-10-CM

## 2013-01-12 ENCOUNTER — Ambulatory Visit (INDEPENDENT_AMBULATORY_CARE_PROVIDER_SITE_OTHER): Payer: Medicare Other

## 2013-01-12 DIAGNOSIS — J309 Allergic rhinitis, unspecified: Secondary | ICD-10-CM | POA: Diagnosis not present

## 2013-01-18 ENCOUNTER — Ambulatory Visit (INDEPENDENT_AMBULATORY_CARE_PROVIDER_SITE_OTHER): Payer: Medicare Other

## 2013-01-18 ENCOUNTER — Telehealth: Payer: Self-pay | Admitting: Internal Medicine

## 2013-01-18 DIAGNOSIS — J309 Allergic rhinitis, unspecified: Secondary | ICD-10-CM

## 2013-01-18 NOTE — Telephone Encounter (Signed)
I called Mrs.Rigsby to let her know Miley will be going to a stronger strength(1:500) When she comes in Thurs. So she could relay the mess. To Merrily. Mrs.Takahashi mentioned Tamrah sneezed all weekend. Is there something you can call in for her or does she need to give the shots more time to work? Please call Mrs.Maler.  Thanks, [employer]

## 2013-01-19 ENCOUNTER — Ambulatory Visit (INDEPENDENT_AMBULATORY_CARE_PROVIDER_SITE_OTHER): Payer: Medicare Other

## 2013-01-19 DIAGNOSIS — J309 Allergic rhinitis, unspecified: Secondary | ICD-10-CM

## 2013-01-19 NOTE — Telephone Encounter (Signed)
Spoke with Margaretha Glassing-- advised of recs per Dr. Nechama Guard verbalized understanding and stated patient did in fact have both medications and would make sure she is taking them.

## 2013-01-19 NOTE — Telephone Encounter (Signed)
Carle can be using her Flonase/ fluticasone nasal spray   2 puffs each nostril once daily at bedtime                                         Antihistamine daily- otc- like Claritin/ loratadine, Allegra/fexofenadine, Zyrtec/cetirizine  If that isn't enough, then let me know.

## 2013-01-21 ENCOUNTER — Ambulatory Visit (INDEPENDENT_AMBULATORY_CARE_PROVIDER_SITE_OTHER): Payer: Medicare Other

## 2013-01-21 DIAGNOSIS — J309 Allergic rhinitis, unspecified: Secondary | ICD-10-CM

## 2013-01-25 ENCOUNTER — Ambulatory Visit (INDEPENDENT_AMBULATORY_CARE_PROVIDER_SITE_OTHER): Payer: Medicare Other

## 2013-01-25 DIAGNOSIS — J309 Allergic rhinitis, unspecified: Secondary | ICD-10-CM

## 2013-01-29 ENCOUNTER — Ambulatory Visit (INDEPENDENT_AMBULATORY_CARE_PROVIDER_SITE_OTHER): Payer: Medicare Other

## 2013-01-29 DIAGNOSIS — J309 Allergic rhinitis, unspecified: Secondary | ICD-10-CM

## 2013-02-02 ENCOUNTER — Ambulatory Visit (INDEPENDENT_AMBULATORY_CARE_PROVIDER_SITE_OTHER): Payer: Medicare Other

## 2013-02-02 ENCOUNTER — Other Ambulatory Visit: Payer: Self-pay | Admitting: Internal Medicine

## 2013-02-02 DIAGNOSIS — J309 Allergic rhinitis, unspecified: Secondary | ICD-10-CM | POA: Diagnosis not present

## 2013-02-02 DIAGNOSIS — Z1231 Encounter for screening mammogram for malignant neoplasm of breast: Secondary | ICD-10-CM

## 2013-02-03 DIAGNOSIS — F259 Schizoaffective disorder, unspecified: Secondary | ICD-10-CM | POA: Diagnosis not present

## 2013-02-04 ENCOUNTER — Ambulatory Visit: Payer: Medicare Other

## 2013-02-08 ENCOUNTER — Ambulatory Visit (INDEPENDENT_AMBULATORY_CARE_PROVIDER_SITE_OTHER): Payer: Medicare Other

## 2013-02-08 DIAGNOSIS — J309 Allergic rhinitis, unspecified: Secondary | ICD-10-CM | POA: Diagnosis not present

## 2013-02-11 ENCOUNTER — Encounter (HOSPITAL_COMMUNITY): Payer: Self-pay | Admitting: Emergency Medicine

## 2013-02-11 ENCOUNTER — Emergency Department (INDEPENDENT_AMBULATORY_CARE_PROVIDER_SITE_OTHER)
Admission: EM | Admit: 2013-02-11 | Discharge: 2013-02-11 | Disposition: A | Discharge status: Home / Self Care | Payer: Medicare Other | Source: Home / Self Care

## 2013-02-11 DIAGNOSIS — L909 Atrophic disorder of skin, unspecified: Secondary | ICD-10-CM

## 2013-02-11 DIAGNOSIS — L918 Other hypertrophic disorders of the skin: Secondary | ICD-10-CM

## 2013-02-11 DIAGNOSIS — B372 Candidiasis of skin and nail: Secondary | ICD-10-CM

## 2013-02-11 NOTE — ED Provider Notes (Signed)
CSN: [id number]     Arrival date & time 02/11/13  6578 History   First MD Initiated Contact with Patient 02/11/13 1012     Chief Complaint  Patient presents with  . Verrucous Vulgaris   (Consider location/radiation/quality/duration/timing/severity/associated sxs/prior Treatment) HPI Comments: 52 year old female presents with "I have a sore wart that grew last night and is painful today."   Past Medical History  Diagnosis Date  . Constipation   . Gastroparesis   . Hypothyroidism   . Type II or unspecified type diabetes mellitus without mention of complication, not stated as uncontrolled   . Anxiety   . Hyperlipemia   . Depression   . Deafness    Past Surgical History  Procedure Laterality Date  . Abdominal hysterectomy     Family History  Problem Relation Age of Onset  . Diabetes Mother   . Heart disease Father   . Colon cancer Neg Hx   . Cancer Mother     Benign  . Liver cancer Maternal Grandmother   . Heart attack Maternal Grandfather   . Emphysema Paternal Grandfather   . Multiple myeloma Father   . Emphysema Father   . Heart attack Father    History  Substance Use Topics  . Smoking status: Never Smoker   . Smokeless tobacco: Never Used  . Alcohol Use: No   OB History   Grav Para Term Preterm Abortions TAB SAB Ect Mult Living                 Review of Systems  Constitutional: Negative.   HENT: Negative.   Respiratory: Negative.   Skin:       Single skin to the left lateral chest along the anterior axillary line where the bra would fit. This is not a wart. An incidental finding of mild infiltrate no under the breast was also discovered.  All other systems reviewed and are negative.    Allergies  Other  Home Medications   Current Outpatient Rx  Name  Route  Sig  Dispense  Refill  . EXPIRED: albuterol (PROVENTIL HFA;VENTOLIN HFA) 108 (90 BASE) MCG/ACT inhaler   Inhalation   Inhale 2 puffs into the lungs every 6 (six) hours as needed for wheezing  or shortness of breath.   1 Inhaler   prn   . EXPIRED: albuterol (PROVENTIL) (2.5 MG/3ML) 0.083% nebulizer solution   Nebulization   Take 3 mLs (2.5 mg total) by nebulization every 6 (six) hours as needed for wheezing or shortness of breath.   75 mL   12   . ALPRAZolam (XANAX) 0.5 MG tablet   Oral   Take 0.5 mg by mouth 2 (two) times daily as needed.          . ARIPiprazole (ABILIFY) 10 MG tablet   Oral   Take 10 mg by mouth daily.         . cholecalciferol (VITAMIN D) 1000 UNITS tablet   Oral   Take 1,000 Units by mouth daily.         Marland Kitchen estradiol (ESTRACE) 1 MG tablet   Oral   Take 1 mg by mouth daily.           . fluticasone (FLONASE) 50 MCG/ACT nasal spray   Nasal   Place 2 sprays into the nose daily.   16 g   prn   . levothyroxine (SYNTHROID, LEVOTHROID) 75 MCG tablet   Oral   Take 75 mcg by mouth daily.         Marland Kitchen  EXPIRED: loratadine (CLARITIN) 10 MG tablet   Oral   Take 1 tablet (10 mg total) by mouth daily.   30 tablet   11   . lubiprostone (AMITIZA) 24 MCG capsule   Oral   Take 24 mcg by mouth 2 (two) times daily as needed.          . methylPREDNISolone (MEDROL) 8 MG tablet      4 X 2 DAYS, 3 X 2 DAYS, 2 X 2 DAYS, 1 X 2 DAYS   20 tablet   0   . mometasone-formoterol (DULERA) 100-5 MCG/ACT AERO   Inhalation   Inhale 2 puffs into the lungs 2 (two) times daily.   1 Inhaler   prn   . omeprazole (PRILOSEC) 20 MG capsule   Oral   Take 1 capsule (20 mg total) by mouth daily.   30 capsule   11   . simvastatin (ZOCOR) 20 MG tablet   Oral   Take 20 mg by mouth at bedtime.            BP 144/87  Pulse 90  Temp(Src) 98 F (36.7 C) (Oral)  Resp 16  SpO2 98% Physical Exam  Nursing note and vitals reviewed. Constitutional: She is oriented to person, place, and time. She appears well-developed and well-nourished. No distress.  Cardiovascular: Normal rate.   Pulmonary/Chest: Effort normal. No respiratory distress.  Neurological: She  is alert and oriented to person, place, and time. She exhibits normal muscle tone.  Skin: Skin is warm and dry. Rash noted.  Intertriginous well marginated slightly pink and swelling he rash under the skin folds of the left and right breast.  There is a solitary, small skin tag at the left brow line along the left anterior axillary line but apparently was irritated by her bra and is now tender. No signs of infection.  Psychiatric: She has a normal mood and affect.    ED Course  Procedures (including critical care time) Labs Review Labs Reviewed - No data to display Imaging Review No results found.  MDM   1. Cutaneous skin tags   2. Candidiasis, intertrigo    Skin tag and skin beneath the tag was cleansed with alcohol. For milliliters of 2% Xylocaine was injected the knee to lesion. The skin tag was then cut off with a #15 blade. Hemostasis controlled with silver nitrate stick. Band-Aid applied.    Lotrimin or Lamisil cream applied under the breast twice a day for at least 2 weeks. Skin tag was removed. Bleeding controlled with silver nitrate. Watch for any signs of infection keep clean with soap and water.  Hayden Rasmussen, NP 02/11/13 1124

## 2013-02-11 NOTE — ED Provider Notes (Signed)
Medical screening examination/treatment/procedure(s) were performed by non-physician practitioner and as supervising physician I was immediately available for consultation/collaboration.  Leslee Home, M.D.  Reuben Likes, MD 02/11/13 [phone]

## 2013-02-11 NOTE — ED Notes (Signed)
C/o left breast wart which was noticed yesterday and has enlarged over night.  no treatment done since the area is sensitive.

## 2013-02-15 ENCOUNTER — Ambulatory Visit: Payer: Medicare Other

## 2013-02-16 ENCOUNTER — Ambulatory Visit (INDEPENDENT_AMBULATORY_CARE_PROVIDER_SITE_OTHER): Payer: Medicare Other

## 2013-02-16 DIAGNOSIS — J309 Allergic rhinitis, unspecified: Secondary | ICD-10-CM | POA: Diagnosis not present

## 2013-02-16 DIAGNOSIS — Z23 Encounter for immunization: Secondary | ICD-10-CM

## 2013-02-22 ENCOUNTER — Ambulatory Visit (INDEPENDENT_AMBULATORY_CARE_PROVIDER_SITE_OTHER): Payer: Medicare Other

## 2013-02-22 DIAGNOSIS — J309 Allergic rhinitis, unspecified: Secondary | ICD-10-CM

## 2013-02-23 ENCOUNTER — Ambulatory Visit: Payer: Medicare Other

## 2013-03-01 ENCOUNTER — Telehealth: Payer: Self-pay | Admitting: Internal Medicine

## 2013-03-01 ENCOUNTER — Ambulatory Visit: Payer: Medicare Other

## 2013-03-01 ENCOUNTER — Ambulatory Visit: Payer: Medicare Other | Admitting: Adult Health

## 2013-03-01 NOTE — Telephone Encounter (Signed)
Can TP see her?

## 2013-03-01 NOTE — Telephone Encounter (Signed)
I spoke with mother. Appt scheduled to see TP this afternoon at 4 PM. The mother then stated she is not able to come with pt and she is deaf. I tried to call to get an interpreter scheduled but no one would answer. I made Jess aware of what was going on. Nothing further needed

## 2013-03-01 NOTE — Telephone Encounter (Signed)
Called, spoke with pt's mother - reports pt has a "bad" cough and seems to be in chest with yellow and brown mucus, isn't sleeping well d/t cough, increased SOB, chest hurts with cough, and pt feels nauseated.  Symptoms started over the weekend. Mom reports pt is using albuterol hfa and albuterol neb with no relief.  She is requesting pt be seen today (prefers this over something being called in).  Dr. Maple Hudson, you have no openings today.  Please advise.  Thank you.  Allergies  Allergen Reactions  . Other     Dog, cat, nuts, grass, trees   Last OV with CDY: 10/06/12

## 2013-03-02 ENCOUNTER — Ambulatory Visit (INDEPENDENT_AMBULATORY_CARE_PROVIDER_SITE_OTHER): Payer: Medicare Other | Admitting: Internal Medicine

## 2013-03-02 ENCOUNTER — Encounter: Payer: Self-pay | Admitting: Internal Medicine

## 2013-03-02 ENCOUNTER — Telehealth: Payer: Self-pay | Admitting: Internal Medicine

## 2013-03-02 VITALS — BP 140/90 | HR 101 | Ht 64.5 in | Wt 209.6 lb

## 2013-03-02 DIAGNOSIS — J209 Acute bronchitis, unspecified: Secondary | ICD-10-CM

## 2013-03-02 DIAGNOSIS — J4520 Mild intermittent asthma, uncomplicated: Secondary | ICD-10-CM

## 2013-03-02 DIAGNOSIS — J45909 Unspecified asthma, uncomplicated: Secondary | ICD-10-CM | POA: Diagnosis not present

## 2013-03-02 MED ORDER — DOXYCYCLINE HYCLATE 100 MG PO TABS: ORAL_TABLET | ORAL | Status: DC

## 2013-03-02 MED ORDER — METHYLPREDNISOLONE ACETATE 80 MG/ML IJ SUSP
80.0000 mg | Freq: Once | INTRAMUSCULAR | Status: AC
  Administered 2013-03-02: 80 mg via INTRAMUSCULAR

## 2013-03-02 MED ORDER — HYDROCODONE-HOMATROPINE 5-1.5 MG/5ML PO SYRP
5.0000 mL | ORAL_SOLUTION | Freq: Four times a day (QID) | ORAL | Status: AC | PRN
Start: 2013-03-02 — End: 2013-03-12

## 2013-03-02 MED ORDER — LEVALBUTEROL HCL 0.63 MG/3ML IN NEBU
0.6300 mg | INHALATION_SOLUTION | Freq: Once | RESPIRATORY_TRACT | Status: AC
  Administered 2013-03-02: 0.63 mg via RESPIRATORY_TRACT

## 2013-03-02 NOTE — Progress Notes (Signed)
52 yoFnever smoker, self-referred for help with snoring. I had seen her for asthma as a teenager.         Mother here Deaf since meningitis as an infant, but able to hear snoring which wakes her. Wakes frequently. No sleep walking or movement disorder, but gets up to bathroom or to eat repeatedly. Then tired next day and naps. Mother has OSA. Chronic allergic rhinitis and asthma. Has been on allergy shots from Dr Velora Heckler since age 52. Needs rescue inhaler. Bedtime around 9:30 PM but has difficulty initiating and maintaining sleep. Easily disturbed. May try to sleep as late as 10:30 in the morning. Cough and throat tickle blamed on allergic rhinitis with postnasal drip. Takes Nasalcrom. No ENT surgery.  10/11/11- 52 yoF never smoker, deaf,  self-referred for help with snoring. I had seen her for allergic asthma as a teenager.          Mother here. She had been on allergy shots for many years and finally stopped last week. They describe coughing fits. Over-the-counter cough syrups did not help. Eyes itch. Some sneezing. Occasional use of rescue inhaler.  OSA-unattended home sleep study on 08/27/2011 confirmed mild obstructive sleep apnea, AHI of 14.1 per hour with moderate snoring and oxygen desaturation to 81%. She returns now after CPAP autotitration. Good control and compliance. Titrated to a best pressure range of 10.4. She is tolerating initial CPAP pretty well. Today we discussed alternatives and how to work with the home care company ([facility]).  11/22/11- 52 yoF never smoker, deaf,  Followed for OSA/ CPAP. I had seen her for allergic asthma as a teenager. Mother here. Wears CPAP every night-still has cool feelings from pressure in machine; lower pressure to 5.0 as it was feeling like too much. CPAP 11/ [facility]. I think she had turned a humidifier or the ramp, thinking she was changing the pressure. Cough is productive of scant clear sputum with no wheezing noted by mother. Has used rescue inhaler a  few times but has not needed her nebulizer machine.  02/24/12- 52 yoF never smoker, deaf,  Followed for OSA/ CPAP               Mother here- signing translator Pt states she is doing okay with Dulera; new sleep study scheduled for 03-17-12. Her original study was an unattended home study done because she is deaf. Her CPAP had been picked up by DME because of poor compliance and insurance requires update sleep study documenting she needs it. She says she was afraid of it so she would take it off after a couple of hours every night. We're giving a booklet and discussing again the medical issues.  04/21/12- 52 yoF never smoker, deaf,  Followed for OSA/ CPAP    Here w/ boy friend and signing translator FOLLOWS FOR: review sleep study with patient from 03-17-12 NPSG 03/17/12- AHI  2.5/ hr, WNL. Normal oxygenation.  I discussed normal range of respiratory events as being less than or equal to 5 per hour. She says the biggest sleep problem at home is that her parents keep the house much too hot. She does not need CPAP.   06/16/2012  51 yoF never smoker, deaf,  Mild intermittent asthma  Complains of prod cough with white mucus x3weeks  Finished prednisone by CY with no improvement.   Here with signing translator Patient denies any hemoptysis, chest pain, orthopnea, PND, or leg swelling. Patient has not been using any over-the-counter products for cough and congestion. She  says that her mucus is very thick, causing her to cough quite often. Does interfere with her sleep. Previous sleep study neg for OSA .   09/21/12- 52 yoF never smoker, deaf,  Mild intermittent asthma    Signing translator  here ACUTE VISIT: started Friday with symptoms-pollen got in nose; by the afternoon had drainage. cough started Saturday and making her feel worse. Felt worse today and tried Advil for pain and only slight relief. Overall feels bad. She does not feel she has a cold. Has been out of nasal spray.  10/06/12-- 52 yoF never  smoker, deaf,  Mild intermittent asthma    Psychologist, forensic and Mother here ACUTE VISIT: still having a cough (alot) since last visit; clear in color; can't tell that the Depo injection form 09-21-12 helped at all. Chest is sore some per patient. Nasal drainage-clear in color. Persistent cough now for 3 weeks. They're anxious because father died of pneumonia. She is coughing clear mucus with no fever. Admits some heartburn and shortness of breath. We discussed reflux.  She and her mother are interested in restarting allergy vaccine. CXR 06/19/12 IMPRESSION: Several vertebral bodies show mild endplate concavity.  Question a degree of osteoporosis. No edema or consolidation.  Original Report Authenticated By: Bretta Bang, M.D.  03/02/13- 52 yoF never smoker, deaf,  Mild intermittent asthma    Signing translator  Here ACUTE VISIT:  Cough w/ brown and yellow mucus x2 days Nasal congestion, chest congestion, no sore throat. Low-grade fever. Had flu vaccine October 7. Using nebulizer. Had 3 days of nausea and vomiting now resolved. Mother is at home, not sick. CXR 10/06/12 IMPRESSION:  No acute abnormalities.  Original Report Authenticated By: Ulyses Southward, M.D.  ROS-see HPI Constitutional:   No-   weight loss, night sweats, fevers, chills,+ fatigue, lassitude. HEENT:   No-  headaches, difficulty swallowing, tooth/dental problems, sore throat, She is deaf- reads lips and signs.     + sneezing, itching, ear ache, +nasal congestion, post nasal drip,  CV:  No-   chest pain, orthopnea, PND, swelling in lower extremities, anasarca, dizziness, palpitations Resp: No-   shortness of breath with exertion or at rest. + productive cough, + nonproductive cough Skin: No-   rash or lesions. GI:  + heartburn, indigestion, abdominal pain, nausea, vomiting,  GU:  MS:  No-   joint pain or swelling.   Neuro-     nothing unusual Psych:  No- change in mood or affect. No depression or anxiety.  No memory  loss.   OBJ- Physical Exam General- Alert, Oriented, Affect-appropriate, Distress- none acute Skin- rash-none, lesions- none, excoriation- none Lymphadenopathy- none Head- atraumatic            Eyes- Gross vision intact, PERRLA, conjunctivae and secretions clear            Ears- deaf            Nose- Clear, no-Septal dev, mucus, polyps, erosion, perforation             Throat- Mallampati II , mucosa+ red , drainage- none, tonsils- atrophic Neck- flexible , trachea midline, no stridor , thyroid nl, carotid no bruit Chest - symmetrical excursion , unlabored           Heart/CV- RRR , no murmur , no gallop  , no rub, nl s1 s2                           -  JVD- none , edema- none, stasis changes- none, varices- none           Lung- clear to P&A, wheeze- none, cough+raspy , dullness-none, rub- none           Chest wall-  Abd- Br/ Gen/ Rectal- Not done, not indicated Extrem- cyanosis- none, clubbing, none, atrophy- none, strength- nl Neuro- grossly intact to observation

## 2013-03-02 NOTE — Patient Instructions (Signed)
Neb xop 0.63  Depo 80  Script for cough syrup  Script for [employer] of fluids and avoid getting chilled  Keep scheduled visit Nov 12

## 2013-03-02 NOTE — Telephone Encounter (Signed)
Called, spoke with pt's mother.  We have scheduled pt to see Dr. Maple Hudson today at 2 pm.  Mother cannot come with pt bc she has broken ribs.  I have called the interpreter office.  They will have a sign language interpreter here today at 2 pm for pt's OV with CDY.  Mother aware of this and voiced no further questions or concerns at this time.

## 2013-03-09 ENCOUNTER — Ambulatory Visit: Payer: Medicare Other

## 2013-03-09 ENCOUNTER — Telehealth: Payer: Self-pay | Admitting: Internal Medicine

## 2013-03-09 MED ORDER — AMOXICILLIN 500 MG PO CAPS: 500.0000 mg | ORAL_CAPSULE | Freq: Three times a day (TID) | ORAL | Status: DC

## 2013-03-09 NOTE — Telephone Encounter (Signed)
Spoke with mother of pt-- Pt's mother states that pt is still having increased cough.  Pt still currently using Hydromet Syrup--helping some. Still having increased cough with yellow mucus--reports vomiting with cough. No other symptoms. Mother states that pt did not take the abx (Doxy 100) that was given at 03/02/13 visit d/t it making her have N/V. Would like to know recs for increased symptoms.  Would also like to know if pt should have allergy injection or not.   [facility] [address] Allergies  Allergen Reactions  . Other     Dog, cat, nuts, grass, trees    Please advise Dr Maple Hudson. Thanks.

## 2013-03-09 NOTE — Telephone Encounter (Signed)
We can change the script for doxycycline to amox 500 mg, # 21,    One, three times daily  I can restart her on allergy vaccine based on her Allergy Profile results from May. She would need to come here to the allergy lab for her shots,  twice weekly x 4 months of build-up, then once weekly long term. We can't let her give her own at home any more.

## 2013-03-09 NOTE — Telephone Encounter (Signed)
Spoke with Heidi Good-patient is wanting to know if its okay to come here and get her vaccine (she does not take injections weekly at home). I called and spoke with Carol(patients mother); she is aware that its okay for patient to come get injections as usual and that we are changing the rx's. I have sent this change to [facility] [address] [facility].

## 2013-03-10 ENCOUNTER — Ambulatory Visit (INDEPENDENT_AMBULATORY_CARE_PROVIDER_SITE_OTHER): Payer: Medicare Other

## 2013-03-10 DIAGNOSIS — J309 Allergic rhinitis, unspecified: Secondary | ICD-10-CM | POA: Diagnosis not present

## 2013-03-15 ENCOUNTER — Ambulatory Visit (INDEPENDENT_AMBULATORY_CARE_PROVIDER_SITE_OTHER): Payer: Medicare Other

## 2013-03-15 DIAGNOSIS — J309 Allergic rhinitis, unspecified: Secondary | ICD-10-CM | POA: Diagnosis not present

## 2013-03-18 NOTE — Assessment & Plan Note (Signed)
Acute exacerbation  

## 2013-03-18 NOTE — Assessment & Plan Note (Addendum)
Acute asthmatic bronchitis, probably viral Plan neb xop, depomedrol, cough med. Keep scheduled appointment

## 2013-03-22 ENCOUNTER — Ambulatory Visit (INDEPENDENT_AMBULATORY_CARE_PROVIDER_SITE_OTHER): Payer: Medicare Other

## 2013-03-22 DIAGNOSIS — J309 Allergic rhinitis, unspecified: Secondary | ICD-10-CM | POA: Diagnosis not present

## 2013-03-24 ENCOUNTER — Ambulatory Visit (INDEPENDENT_AMBULATORY_CARE_PROVIDER_SITE_OTHER): Payer: Medicare Other

## 2013-03-24 ENCOUNTER — Encounter: Payer: Self-pay | Admitting: Internal Medicine

## 2013-03-24 ENCOUNTER — Ambulatory Visit (INDEPENDENT_AMBULATORY_CARE_PROVIDER_SITE_OTHER): Payer: Medicare Other | Admitting: Internal Medicine

## 2013-03-24 VITALS — BP 110/62 | HR 82 | Ht 64.5 in | Wt 206.6 lb

## 2013-03-24 DIAGNOSIS — G4733 Obstructive sleep apnea (adult) (pediatric): Secondary | ICD-10-CM

## 2013-03-24 DIAGNOSIS — J309 Allergic rhinitis, unspecified: Secondary | ICD-10-CM

## 2013-03-24 DIAGNOSIS — J4520 Mild intermittent asthma, uncomplicated: Secondary | ICD-10-CM

## 2013-03-24 DIAGNOSIS — J45909 Unspecified asthma, uncomplicated: Secondary | ICD-10-CM | POA: Diagnosis not present

## 2013-03-24 NOTE — Assessment & Plan Note (Signed)
GTood control, building vaccine

## 2013-03-24 NOTE — Assessment & Plan Note (Signed)
Feels she is sleeping ok at this time.

## 2013-03-24 NOTE — Assessment & Plan Note (Signed)
Doing very well with vaccine build-up. No recent infection. Has meds.  Plan- continue vaccine build

## 2013-03-24 NOTE — Patient Instructions (Signed)
We can continue routine allergy vaccine build-up. You are doing well.   Please call as needed

## 2013-03-24 NOTE — Progress Notes (Signed)
71 yoFnever smoker, self-referred for help with snoring. I had seen her for asthma as a teenager.         Mother here Deaf since meningitis as an infant, but able to hear snoring which wakes her. Wakes frequently. No sleep walking or movement disorder, but gets up to bathroom or to eat repeatedly. Then tired next day and naps. Mother has OSA. Chronic allergic rhinitis and asthma. Has been on allergy shots from Dr Velora Heckler since age 52. Needs rescue inhaler. Bedtime around 9:30 PM but has difficulty initiating and maintaining sleep. Easily disturbed. May try to sleep as late as 10:30 in the morning. Cough and throat tickle blamed on allergic rhinitis with postnasal drip. Takes Nasalcrom. No ENT surgery.  10/11/11- 59 yoF never smoker, deaf,  self-referred for help with snoring. I had seen her for allergic asthma as a teenager.          Mother here. She had been on allergy shots for many years and finally stopped last week. They describe coughing fits. Over-the-counter cough syrups did not help. Eyes itch. Some sneezing. Occasional use of rescue inhaler.  OSA-unattended home sleep study on 08/27/2011 confirmed mild obstructive sleep apnea, AHI of 14.1 per hour with moderate snoring and oxygen desaturation to 81%. She returns now after CPAP autotitration. Good control and compliance. Titrated to a best pressure range of 10.4. She is tolerating initial CPAP pretty well. Today we discussed alternatives and how to work with the home care company ([facility]).  11/22/11- 34 yoF never smoker, deaf,  Followed for OSA/ CPAP. I had seen her for allergic asthma as a teenager. Mother here. Wears CPAP every night-still has cool feelings from pressure in machine; lower pressure to 5.0 as it was feeling like too much. CPAP 11/ [facility]. I think she had turned a humidifier or the ramp, thinking she was changing the pressure. Cough is productive of scant clear sputum with no wheezing noted by mother. Has used rescue inhaler a  few times but has not needed her nebulizer machine.  02/24/12- 81 yoF never smoker, deaf,  Followed for OSA/ CPAP               Mother here- signing translator Pt states she is doing okay with Dulera; new sleep study scheduled for 03-17-12. Her original study was an unattended home study done because she is deaf. Her CPAP had been picked up by DME because of poor compliance and insurance requires update sleep study documenting she needs it. She says she was afraid of it so she would take it off after a couple of hours every night. We're giving a booklet and discussing again the medical issues.  04/21/12- 54 yoF never smoker, deaf,  Followed for OSA/ CPAP    Here w/ boy friend and signing translator FOLLOWS FOR: review sleep study with patient from 03-17-12 NPSG 03/17/12- AHI  2.5/ hr, WNL. Normal oxygenation.  I discussed normal range of respiratory events as being less than or equal to 5 per hour. She says the biggest sleep problem at home is that her parents keep the house much too hot. She does not need CPAP.   06/16/2012  51 yoF never smoker, deaf,  Mild intermittent asthma  Complains of prod cough with white mucus x3weeks  Finished prednisone by CY with no improvement.   Here with signing translator Patient denies any hemoptysis, chest pain, orthopnea, PND, or leg swelling. Patient has not been using any over-the-counter products for cough and congestion. She  says that her mucus is very thick, causing her to cough quite often. Does interfere with her sleep. Previous sleep study neg for OSA .   09/21/12- 52 yoF never smoker, deaf,  Mild intermittent asthma    Signing translator  here ACUTE VISIT: started Friday with symptoms-pollen got in nose; by the afternoon had drainage. cough started Saturday and making her feel worse. Felt worse today and tried Advil for pain and only slight relief. Overall feels bad. She does not feel she has a cold. Has been out of nasal spray.  10/06/12-- 52 yoF never  smoker, deaf,  Mild intermittent asthma    Psychologist, forensic and Mother here ACUTE VISIT: still having a cough (alot) since last visit; clear in color; can't tell that the Depo injection form 09-21-12 helped at all. Chest is sore some per patient. Nasal drainage-clear in color. Persistent cough now for 3 weeks. They're anxious because father died of pneumonia. She is coughing clear mucus with no fever. Admits some heartburn and shortness of breath. We discussed reflux.  She and her mother are interested in restarting allergy vaccine. CXR 06/19/12 IMPRESSION: Several vertebral bodies show mild endplate concavity.  Question a degree of osteoporosis. No edema or consolidation.  Original Report Authenticated By: Bretta Bang, M.D.  03/02/13- 56 yoF never smoker, deaf,  Mild intermittent asthma    Signing translator  Here ACUTE VISIT:  Cough w/ brown and yellow mucus x2 days Nasal congestion, chest congestion, no sore throat. Low-grade fever. Had flu vaccine October 7. Using nebulizer. Had 3 days of nausea and vomiting now resolved. Mother is at home, not sick. CXR 10/06/12 IMPRESSION:  No acute abnormalities.  Original Report Authenticated By: Ulyses Southward, M.D.  03/24/13- 69 yoF never smoker, deaf/ lip reading,  Mild intermittent asthma     FOLLOWS FOR: still on vaccine and doing well with it; states she is having slight cough. Allergy vaccine build-up here going well, no problems. Feels "fine" today. Cough is incidental and does not feel sick.   ROS-see HPI Constitutional:   No-   weight loss, night sweats, fevers, chills,+ fatigue, lassitude. HEENT:   No-  headaches, difficulty swallowing, tooth/dental problems, sore throat, She is deaf- reads lips and signs.     No- sneezing, itching, ear ache, +nasal congestion, post nasal drip,  CV:  No-   chest pain, orthopnea, PND, swelling in lower extremities, anasarca, dizziness, palpitations Resp: No-   shortness of breath with exertion or at rest.  No- productive cough, + nonproductive cough Skin: No-   rash or lesions. GI:  + heartburn, indigestion, abdominal pain, nausea, vomiting,  GU:  MS:  No-   joint pain or swelling.   Neuro-     nothing unusual Psych:  No- change in mood or affect. No depression or anxiety.  No memory loss.   OBJ- Physical Exam General- Alert, Oriented, Affect-appropriate, Distress- none acute. Looks well Skin- rash-none, lesions- none, excoriation- none Lymphadenopathy- none Head- atraumatic            Eyes- Gross vision intact, PERRLA, conjunctivae and secretions clear            Ears- deaf            Nose- Clear, no-Septal dev, mucus, polyps, erosion, perforation             Throat- Mallampati II , mucosa+ red , drainage- none, tonsils- atrophic Neck- flexible , trachea midline, no stridor , thyroid nl, carotid no bruit Chest -  symmetrical excursion , unlabored           Heart/CV- RRR , no murmur , no gallop  , no rub, nl s1 s2                           - JVD- none , edema- none, stasis changes- none, varices- none           Lung- clear to P&A, wheeze- none, cough- none, dullness-none, rub- none           Chest wall-  Abd- Br/ Gen/ Rectal- Not done, not indicated Extrem- cyanosis- none, clubbing, none, atrophy- none, strength- nl Neuro- grossly intact to observation

## 2013-03-29 ENCOUNTER — Ambulatory Visit (INDEPENDENT_AMBULATORY_CARE_PROVIDER_SITE_OTHER): Payer: Medicare Other

## 2013-03-29 DIAGNOSIS — J309 Allergic rhinitis, unspecified: Secondary | ICD-10-CM | POA: Diagnosis not present

## 2013-04-12 ENCOUNTER — Ambulatory Visit (INDEPENDENT_AMBULATORY_CARE_PROVIDER_SITE_OTHER): Payer: Medicare Other

## 2013-04-12 DIAGNOSIS — J309 Allergic rhinitis, unspecified: Secondary | ICD-10-CM | POA: Diagnosis not present

## 2013-04-16 ENCOUNTER — Ambulatory Visit: Payer: Medicare Other

## 2013-04-19 ENCOUNTER — Ambulatory Visit (INDEPENDENT_AMBULATORY_CARE_PROVIDER_SITE_OTHER): Payer: Medicare Other

## 2013-04-19 DIAGNOSIS — R300 Dysuria: Secondary | ICD-10-CM | POA: Diagnosis not present

## 2013-04-19 DIAGNOSIS — J309 Allergic rhinitis, unspecified: Secondary | ICD-10-CM

## 2013-04-26 ENCOUNTER — Ambulatory Visit (INDEPENDENT_AMBULATORY_CARE_PROVIDER_SITE_OTHER): Payer: Medicare Other

## 2013-04-26 DIAGNOSIS — J309 Allergic rhinitis, unspecified: Secondary | ICD-10-CM

## 2013-05-03 ENCOUNTER — Ambulatory Visit (INDEPENDENT_AMBULATORY_CARE_PROVIDER_SITE_OTHER): Payer: Medicare Other

## 2013-05-03 DIAGNOSIS — J309 Allergic rhinitis, unspecified: Secondary | ICD-10-CM | POA: Diagnosis not present

## 2013-05-10 ENCOUNTER — Telehealth: Payer: Self-pay | Admitting: Internal Medicine

## 2013-05-10 ENCOUNTER — Encounter: Payer: Self-pay | Admitting: Internal Medicine

## 2013-05-10 ENCOUNTER — Ambulatory Visit (INDEPENDENT_AMBULATORY_CARE_PROVIDER_SITE_OTHER): Payer: Medicare Other

## 2013-05-10 ENCOUNTER — Ambulatory Visit (INDEPENDENT_AMBULATORY_CARE_PROVIDER_SITE_OTHER): Payer: Medicare Other | Admitting: Internal Medicine

## 2013-05-10 VITALS — BP 130/74 | HR 105 | Ht 64.5 in | Wt 210.0 lb

## 2013-05-10 DIAGNOSIS — J209 Acute bronchitis, unspecified: Secondary | ICD-10-CM

## 2013-05-10 DIAGNOSIS — J45909 Unspecified asthma, uncomplicated: Secondary | ICD-10-CM

## 2013-05-10 DIAGNOSIS — G4733 Obstructive sleep apnea (adult) (pediatric): Secondary | ICD-10-CM

## 2013-05-10 DIAGNOSIS — J309 Allergic rhinitis, unspecified: Secondary | ICD-10-CM

## 2013-05-10 DIAGNOSIS — J4520 Mild intermittent asthma, uncomplicated: Secondary | ICD-10-CM

## 2013-05-10 MED ORDER — ALBUTEROL SULFATE (2.5 MG/3ML) 0.083% IN NEBU: 2.5000 mg | INHALATION_SOLUTION | Freq: Four times a day (QID) | RESPIRATORY_TRACT | Status: DC | PRN

## 2013-05-10 MED ORDER — ALBUTEROL SULFATE HFA 108 (90 BASE) MCG/ACT IN AERS: 2.0000 | INHALATION_SPRAY | Freq: Four times a day (QID) | RESPIRATORY_TRACT | Status: DC | PRN

## 2013-05-10 MED ORDER — LEVALBUTEROL HCL 0.63 MG/3ML IN NEBU
0.6300 mg | INHALATION_SOLUTION | Freq: Once | RESPIRATORY_TRACT | Status: AC
  Administered 2013-05-10: 0.63 mg via RESPIRATORY_TRACT

## 2013-05-10 MED ORDER — METHYLPREDNISOLONE ACETATE 80 MG/ML IJ SUSP
80.0000 mg | Freq: Once | INTRAMUSCULAR | Status: AC
  Administered 2013-05-10: 80 mg via INTRAMUSCULAR

## 2013-05-10 NOTE — Telephone Encounter (Signed)
I spoke with mother. She reports took pt to [facility]-pt having asthma attack yesterday but was 2 hr wait so they left. She reports pt is still feeling SOB w/ rest/activity, cough w/ clear phlem, wheezing , chest tx. Pt has used her inhalers, cough syrup and slept with humidifier on last night. Mother wants pt to be seen today. They are held spots on Dr. Maple Hudson schedule that states "please ask to use". Please advise thanks  Allergies  Allergen Reactions  . Doxycycline Nausea And Vomiting  . Other     Dog, cat, nuts, grass, trees    Current Outpatient Prescriptions on File Prior to Visit  Medication Sig Dispense Refill  . albuterol (PROVENTIL HFA;VENTOLIN HFA) 108 (90 BASE) MCG/ACT inhaler Inhale 2 puffs into the lungs every 6 (six) hours as needed for wheezing or shortness of breath.  1 Inhaler  prn  . albuterol (PROVENTIL) (2.5 MG/3ML) 0.083% nebulizer solution Take 3 mLs (2.5 mg total) by nebulization every 6 (six) hours as needed for wheezing or shortness of breath.  75 mL  12  . ALPRAZolam (XANAX) 0.5 MG tablet Take 0.5 mg by mouth 2 (two) times daily as needed.       . ARIPiprazole (ABILIFY) 10 MG tablet Take 10 mg by mouth daily.      . cholecalciferol (VITAMIN D) 1000 UNITS tablet Take 1,000 Units by mouth daily.      Marland Kitchen estradiol (ESTRACE) 1 MG tablet Take 1 mg by mouth daily.        . fluticasone (FLONASE) 50 MCG/ACT nasal spray Place 2 sprays into the nose daily.  16 g  prn  . levothyroxine (SYNTHROID, LEVOTHROID) 75 MCG tablet Take 75 mcg by mouth daily.      Marland Kitchen loratadine (CLARITIN) 10 MG tablet Take 1 tablet (10 mg total) by mouth daily.  30 tablet  11  . lubiprostone (AMITIZA) 24 MCG capsule Take 24 mcg by mouth 2 (two) times daily as needed.       . mometasone-formoterol (DULERA) 100-5 MCG/ACT AERO Inhale 2 puffs into the lungs 2 (two) times daily.  1 Inhaler  prn  . omeprazole (PRILOSEC) 20 MG capsule Take 1 capsule (20 mg total) by mouth daily.  30 capsule  11  . simvastatin (ZOCOR) 20  MG tablet Take 20 mg by mouth at bedtime.         No current facility-administered medications on file prior to visit.

## 2013-05-10 NOTE — Progress Notes (Signed)
71 yoFnever smoker, self-referred for help with snoring. I had seen her for asthma as a teenager.         Mother here Deaf since meningitis as an infant, but able to hear snoring which wakes her. Wakes frequently. No sleep walking or movement disorder, but gets up to bathroom or to eat repeatedly. Then tired next day and naps. Mother has OSA. Chronic allergic rhinitis and asthma. Has been on allergy shots from Dr Velora Heckler since age 52. Needs rescue inhaler. Bedtime around 9:30 PM but has difficulty initiating and maintaining sleep. Easily disturbed. May try to sleep as late as 10:30 in the morning. Cough and throat tickle blamed on allergic rhinitis with postnasal drip. Takes Nasalcrom. No ENT surgery.  10/11/11- 59 yoF never smoker, deaf,  self-referred for help with snoring. I had seen her for allergic asthma as a teenager.          Mother here. She had been on allergy shots for many years and finally stopped last week. They describe coughing fits. Over-the-counter cough syrups did not help. Eyes itch. Some sneezing. Occasional use of rescue inhaler.  OSA-unattended home sleep study on 08/27/2011 confirmed mild obstructive sleep apnea, AHI of 14.1 per hour with moderate snoring and oxygen desaturation to 81%. She returns now after CPAP autotitration. Good control and compliance. Titrated to a best pressure range of 10.4. She is tolerating initial CPAP pretty well. Today we discussed alternatives and how to work with the home care company ([facility]).  11/22/11- 34 yoF never smoker, deaf,  Followed for OSA/ CPAP. I had seen her for allergic asthma as a teenager. Mother here. Wears CPAP every night-still has cool feelings from pressure in machine; lower pressure to 5.0 as it was feeling like too much. CPAP 11/ [facility]. I think she had turned a humidifier or the ramp, thinking she was changing the pressure. Cough is productive of scant clear sputum with no wheezing noted by mother. Has used rescue inhaler a  few times but has not needed her nebulizer machine.  02/24/12- 81 yoF never smoker, deaf,  Followed for OSA/ CPAP               Mother here- signing translator Pt states she is doing okay with Dulera; new sleep study scheduled for 03-17-12. Her original study was an unattended home study done because she is deaf. Her CPAP had been picked up by DME because of poor compliance and insurance requires update sleep study documenting she needs it. She says she was afraid of it so she would take it off after a couple of hours every night. We're giving a booklet and discussing again the medical issues.  04/21/12- 54 yoF never smoker, deaf,  Followed for OSA/ CPAP    Here w/ boy friend and signing translator FOLLOWS FOR: review sleep study with patient from 03-17-12 NPSG 03/17/12- AHI  2.5/ hr, WNL. Normal oxygenation.  I discussed normal range of respiratory events as being less than or equal to 5 per hour. She says the biggest sleep problem at home is that her parents keep the house much too hot. She does not need CPAP.   06/16/2012  51 yoF never smoker, deaf,  Mild intermittent asthma  Complains of prod cough with white mucus x3weeks  Finished prednisone by CY with no improvement.   Here with signing translator Patient denies any hemoptysis, chest pain, orthopnea, PND, or leg swelling. Patient has not been using any over-the-counter products for cough and congestion. She  says that her mucus is very thick, causing her to cough quite often. Does interfere with her sleep. Previous sleep study neg for OSA .   09/21/12- 52 yoF never smoker, deaf,  Mild intermittent asthma    Signing translator  here ACUTE VISIT: started Friday with symptoms-pollen got in nose; by the afternoon had drainage. cough started Saturday and making her feel worse. Felt worse today and tried Advil for pain and only slight relief. Overall feels bad. She does not feel she has a cold. Has been out of nasal spray.  10/06/12-- 52 yoF never  smoker, deaf,  Mild intermittent asthma    Psychologist, forensic and Mother here ACUTE VISIT: still having a cough (alot) since last visit; clear in color; can't tell that the Depo injection form 09-21-12 helped at all. Chest is sore some per patient. Nasal drainage-clear in color. Persistent cough now for 3 weeks. They're anxious because father died of pneumonia. She is coughing clear mucus with no fever. Admits some heartburn and shortness of breath. We discussed reflux.  She and her mother are interested in restarting allergy vaccine. CXR 06/19/12 IMPRESSION: Several vertebral bodies show mild endplate concavity.  Question a degree of osteoporosis. No edema or consolidation.  Original Report Authenticated By: Bretta Bang, M.D.  03/02/13- 7 yoF never smoker, deaf,  Mild intermittent asthma    Signing translator  Here ACUTE VISIT:  Cough w/ brown and yellow mucus x2 days Nasal congestion, chest congestion, no sore throat. Low-grade fever. Had flu vaccine October 7. Using nebulizer. Had 3 days of nausea and vomiting now resolved. Mother is at home, not sick. CXR 10/06/12 IMPRESSION:  No acute abnormalities.  Original Report Authenticated By: Ulyses Southward, M.D.  03/24/13- 48 yoF never smoker, deaf/ lip reading,  Mild intermittent asthma     FOLLOWS FOR: still on vaccine and doing well with it; states she is having slight cough. Allergy vaccine build-up here going well, no problems. Feels "fine" today. Cough is incidental and does not feel sick.   05/10/13- 52 yoF never smoker, deaf/ lip reading,  Mild intermittent asthma   ACUTE VISIT: Pt had someone at her house cleaning over the weekend-had to change the air filter and started to feel tightness in chest. They are scheduled to change and clean air filters/vents today. Deafness translator here. Increased cough and nasal congestion noticed after HVAC filter changes at home yesterday. Asthma flare. Out of cough medicine. Nebulizer medicine expired.  She has a sample Ventolin rescue inhaler.  ROS-see HPI Constitutional:   No-   weight loss, night sweats, fevers, chills,+ fatigue, lassitude. HEENT:   No-  headaches, difficulty swallowing, tooth/dental problems, sore throat, She is deaf- reads lips and signs.     No- sneezing, itching, ear ache, +nasal congestion, post nasal drip,  CV:  No-   chest pain, orthopnea, PND, swelling in lower extremities, anasarca, dizziness, palpitations Resp: + shortness of breath with exertion or at rest. No- productive cough, + nonproductive cough Skin: No-   rash or lesions. GI:  + heartburn, indigestion, abdominal pain, nausea, vomiting,  GU:  MS:  No-   joint pain or swelling.   Neuro-     nothing unusual Psych:  No- change in mood or affect. No depression or anxiety.  No memory loss.   OBJ- Physical Exam General- Alert, Oriented, Affect-appropriate, Distress- none acute. Looks well Skin- rash-none, lesions- none, excoriation- none Lymphadenopathy- none Head- atraumatic  Eyes- Gross vision intact, PERRLA, conjunctivae and secretions clear            Ears- deaf            Nose- Clear, no-Septal dev, mucus, polyps, erosion, perforation             Throat- Mallampati II , mucosa+ red , drainage- none, tonsils- atrophic Neck- flexible , trachea midline, no stridor , thyroid nl, carotid no bruit Chest - symmetrical excursion , unlabored           Heart/CV- RRR , no murmur , no gallop  , no rub, nl s1 s2                           - JVD- none , edema- none, stasis changes- none, varices- none           Lung- clear to P&A, wheeze- none, cough+dry, dullness-none, rub- none           Chest wall-  Abd- Br/ Gen/ Rectal- Not done, not indicated Extrem- cyanosis- none, clubbing, none, atrophy- none, strength- nl Neuro- grossly intact to observation

## 2013-05-10 NOTE — Patient Instructions (Addendum)
Neb xop 0.63  Depo 80  Script to refill nebulizer medicine- albuterol    You can use this up to 4 times daily when needed  Script to refill blue Ventolin rescue inhaler You can use this up to 4 times daily when needed instead of nebulizer treatments  Keep appointment in May unless needed sooner

## 2013-05-10 NOTE — Telephone Encounter (Signed)
Called, spoke with pt's mother.  We have scheduled pt to see Dr. Maple Hudson today at 11:30 am.  They will discuss pt having allergy shot today during OV.  Mom aware.  I lmom at [phone] to have an interpreter for today's OV.

## 2013-05-10 NOTE — Telephone Encounter (Signed)
Per CY-lets have patient come in today at 11:30am slot to be seen. Thanks.

## 2013-05-17 ENCOUNTER — Encounter: Payer: Self-pay | Admitting: Family

## 2013-05-17 ENCOUNTER — Ambulatory Visit (INDEPENDENT_AMBULATORY_CARE_PROVIDER_SITE_OTHER): Payer: Medicare Other | Admitting: Family

## 2013-05-17 ENCOUNTER — Ambulatory Visit (INDEPENDENT_AMBULATORY_CARE_PROVIDER_SITE_OTHER): Payer: Medicare Other

## 2013-05-17 VITALS — BP 130/80 | HR 89 | Wt 209.0 lb

## 2013-05-17 DIAGNOSIS — J45909 Unspecified asthma, uncomplicated: Secondary | ICD-10-CM | POA: Diagnosis not present

## 2013-05-17 DIAGNOSIS — E119 Type 2 diabetes mellitus without complications: Secondary | ICD-10-CM | POA: Diagnosis not present

## 2013-05-17 DIAGNOSIS — H9190 Unspecified hearing loss, unspecified ear: Secondary | ICD-10-CM | POA: Diagnosis not present

## 2013-05-17 DIAGNOSIS — H9193 Unspecified hearing loss, bilateral: Secondary | ICD-10-CM

## 2013-05-17 DIAGNOSIS — J309 Allergic rhinitis, unspecified: Secondary | ICD-10-CM

## 2013-05-17 DIAGNOSIS — E039 Hypothyroidism, unspecified: Secondary | ICD-10-CM

## 2013-05-17 LAB — CBC WITH DIFFERENTIAL/PLATELET
BASOS ABS: 0 10*3/uL (ref 0.0–0.1)
Basophils Relative: 0.4 % (ref 0.0–3.0)
Eosinophils Absolute: 0.6 10*3/uL (ref 0.0–0.7)
Eosinophils Relative: 4.6 % (ref 0.0–5.0)
HCT: 43.2 % (ref 36.0–46.0)
Hemoglobin: 14.8 g/dL (ref 12.0–15.0)
LYMPHS ABS: 3.3 10*3/uL (ref 0.7–4.0)
LYMPHS PCT: 24.5 % (ref 12.0–46.0)
MCHC: 34.1 g/dL (ref 30.0–36.0)
MCV: 88.1 fl (ref 78.0–100.0)
MONOS PCT: 6.9 % (ref 3.0–12.0)
Monocytes Absolute: 0.9 10*3/uL (ref 0.1–1.0)
Neutro Abs: 8.5 10*3/uL — ABNORMAL HIGH (ref 1.4–7.7)
Neutrophils Relative %: 63.6 % (ref 43.0–77.0)
PLATELETS: 242 10*3/uL (ref 150.0–400.0)
RBC: 4.9 Mil/uL (ref 3.87–5.11)
RDW: 13 % (ref 11.5–14.6)
WBC: 13.3 10*3/uL — ABNORMAL HIGH (ref 4.5–10.5)

## 2013-05-17 LAB — BASIC METABOLIC PANEL
BUN: 8 mg/dL (ref 6–23)
CALCIUM: 9.4 mg/dL (ref 8.4–10.5)
CO2: 28 mEq/L (ref 19–32)
Chloride: 101 mEq/L (ref 96–112)
Creatinine, Ser: 0.9 mg/dL (ref 0.4–1.2)
GFR: 73.67 mL/min (ref >60.00)
GLUCOSE: 104 mg/dL — AB (ref 70–99)
Potassium: 4.4 mEq/L (ref 3.5–5.1)
SODIUM: 138 meq/L (ref 135–145)

## 2013-05-17 LAB — HEPATIC FUNCTION PANEL
ALBUMIN: 4.2 g/dL (ref 3.5–5.2)
ALK PHOS: 88 U/L (ref 39–117)
ALT: 30 U/L (ref 0–35)
AST: 29 U/L (ref 0–37)
BILIRUBIN TOTAL: 0.9 mg/dL (ref 0.3–1.2)
Bilirubin, Direct: 0 mg/dL (ref 0.0–0.3)
Total Protein: 7.8 g/dL (ref 6.0–8.3)

## 2013-05-17 LAB — HEMOGLOBIN A1C: HEMOGLOBIN A1C: 6 % (ref 4.6–6.5)

## 2013-05-17 LAB — TSH: TSH: 1.27 u[IU]/mL (ref 0.35–5.50)

## 2013-05-17 MED ORDER — LAMOTRIGINE 25 MG PO TABS: 25.0000 mg | ORAL_TABLET | Freq: Two times a day (BID) | ORAL | Status: DC

## 2013-05-17 NOTE — Progress Notes (Signed)
Pre visit review using our clinic review tool, if applicable. No additional management support is needed unless otherwise documented below in the visit note. 

## 2013-05-17 NOTE — Patient Instructions (Addendum)
1. Lamical 1 tab daily in the morning x 1 week 2. Week 2 twice a day and maintain at that dose until I see you in 3 weeks.

## 2013-05-17 NOTE — Progress Notes (Signed)
Subjective:    Patient ID: Heidi Good, female    DOB: 1961/03/29, 53 y.o.   MRN: [id number]  HPI 53 year old white female, nonsmoker, hearing impaired female is in today with an interpreter and her mother to be established. She has a history of hypothyroidism, schizoaffective disorder-untreated, hearing impairment, paranoia, anxiety, and asthma. She reports having paranoia. Is not under the care of psychiatry but has been in the past. Her mother reports that she often feels like someone is trying to kill her. She has had paranoia x 20 years with occasional medication intervention. She is unsure if anything has worked in the past. Patient also reports worsening hot flashes. She has been on hormone replacement in the past but not currently.    Review of Systems  Constitutional: Negative.   HENT: Negative.   Respiratory: Negative.   Cardiovascular: Negative.   Gastrointestinal: Negative.   Endocrine: Negative.   Genitourinary: Negative.   Musculoskeletal: Negative.   Skin: Negative.   Allergic/Immunologic: Negative.   Neurological: Negative.   Hematological: Negative.   Psychiatric/Behavioral: Positive for confusion and agitation. The patient is nervous/anxious.        Paranoia   Past Medical History  Diagnosis Date  . Constipation   . Gastroparesis   . Hypothyroidism   . Type II or unspecified type diabetes mellitus without mention of complication, not stated as uncontrolled   . Anxiety   . Hyperlipemia   . Depression   . Deafness     History   Social History  . Marital Status: Divorced    Spouse Name: N/A    Number of Children: 0  . Years of Education: N/A   Occupational History  . disablied    Social History Main Topics  . Smoking status: Never Smoker   . Smokeless tobacco: Never Used  . Alcohol Use: No  . Drug Use: No  . Sexual Activity: Not on file   Other Topics Concern  . Not on file   Social History Narrative  . No narrative on file    Past  Surgical History  Procedure Laterality Date  . Abdominal hysterectomy      Family History  Problem Relation Age of Onset  . Diabetes Mother   . Heart disease Father   . Colon cancer Neg Hx   . Cancer Mother     Benign  . Liver cancer Maternal Grandmother   . Heart attack Maternal Grandfather   . Emphysema Paternal Grandfather   . Multiple myeloma Father   . Emphysema Father   . Heart attack Father     Allergies  Allergen Reactions  . Doxycycline Nausea And Vomiting  . Other     Dog, cat, nuts, grass, trees    Current Outpatient Prescriptions on File Prior to Visit  Medication Sig Dispense Refill  . albuterol (PROVENTIL HFA;VENTOLIN HFA) 108 (90 BASE) MCG/ACT inhaler Inhale 2 puffs into the lungs every 6 (six) hours as needed for wheezing or shortness of breath.  1 Inhaler  prn  . albuterol (PROVENTIL) (2.5 MG/3ML) 0.083% nebulizer solution Take 3 mLs (2.5 mg total) by nebulization every 6 (six) hours as needed for wheezing or shortness of breath.  75 mL  12  . ALPRAZolam (XANAX) 0.5 MG tablet Take 0.5 mg by mouth 2 (two) times daily as needed.       Marland Kitchen estradiol (ESTRACE) 1 MG tablet Take 1 mg by mouth daily.        . fluticasone (FLONASE)  50 MCG/ACT nasal spray Place 2 sprays into the nose daily.  16 g  prn  . levothyroxine (SYNTHROID, LEVOTHROID) 75 MCG tablet Take 75 mcg by mouth daily.      Marland Kitchen loratadine (CLARITIN) 10 MG tablet Take 1 tablet (10 mg total) by mouth daily.  30 tablet  11  . mometasone-formoterol (DULERA) 100-5 MCG/ACT AERO Inhale 2 puffs into the lungs 2 (two) times daily.  1 Inhaler  prn  . simvastatin (ZOCOR) 20 MG tablet Take 20 mg by mouth at bedtime.         No current facility-administered medications on file prior to visit.    BP 130/80  Pulse 89  Wt 209 lb (94.802 kg)chart    Objective:   Physical Exam  Constitutional: She is oriented to person, place, and time. She appears well-developed and well-nourished.  HENT:  Right Ear: External ear  normal.  Left Ear: External ear normal.  Nose: Nose normal.  Mouth/Throat: Oropharynx is clear and moist.  Neck: Normal range of motion. Neck supple.  Cardiovascular: Normal rate, regular rhythm and normal heart sounds.   Pulmonary/Chest: Effort normal and breath sounds normal.  Abdominal: Soft. Bowel sounds are normal.  Musculoskeletal: Normal range of motion.  Neurological: She is alert and oriented to person, place, and time.  Skin: Skin is warm and dry.  Psychiatric: She has a normal mood and affect.          Assessment & Plan:  Assessment: 1. Hypothyroidism 2. Schizoaffective Disorder 3. Hearing Impairment 4. Paranoia  5. Anxiety  Plan: Start Lamictal $RemoveBefore'25mg'[id number]$ . see psychiatry for further management.Labs done today to include TSH, BMP, CBC, LFTs and notify patient of the results. Return for complete physical exam fasting. Refer for mammogram screening. Patient will need a Pap smear when she returns.

## 2013-05-24 ENCOUNTER — Ambulatory Visit (INDEPENDENT_AMBULATORY_CARE_PROVIDER_SITE_OTHER): Payer: Medicare Other

## 2013-05-24 DIAGNOSIS — J309 Allergic rhinitis, unspecified: Secondary | ICD-10-CM | POA: Diagnosis not present

## 2013-05-30 NOTE — Assessment & Plan Note (Signed)
Acute exacerbation attributed to dust from changing home cultures. Suspect anxiety component. Plan-nebulizer Xopenex, Depo-Medrol, refill nebulizer medication to have available this winter

## 2013-05-30 NOTE — Assessment & Plan Note (Signed)
Failed CPAP

## 2013-05-30 NOTE — Assessment & Plan Note (Signed)
No problems reported with allergy vaccine 1:50 GH

## 2013-05-31 ENCOUNTER — Ambulatory Visit: Payer: Medicare Other

## 2013-06-03 ENCOUNTER — Encounter: Payer: Self-pay | Admitting: Internal Medicine

## 2013-06-03 ENCOUNTER — Ambulatory Visit (INDEPENDENT_AMBULATORY_CARE_PROVIDER_SITE_OTHER): Payer: Medicare Other

## 2013-06-03 DIAGNOSIS — J309 Allergic rhinitis, unspecified: Secondary | ICD-10-CM

## 2013-06-08 ENCOUNTER — Other Ambulatory Visit (HOSPITAL_COMMUNITY)
Admission: RE | Admit: 2013-06-08 | Discharge: 2013-06-08 | Disposition: A | Discharge status: Home / Self Care | Payer: Medicare Other | Source: Ambulatory Visit | Attending: Family | Admitting: Family

## 2013-06-08 ENCOUNTER — Ambulatory Visit (INDEPENDENT_AMBULATORY_CARE_PROVIDER_SITE_OTHER): Payer: Medicare Other | Admitting: Family

## 2013-06-08 ENCOUNTER — Encounter: Payer: Self-pay | Admitting: Family

## 2013-06-08 VITALS — BP 140/70 | HR 87 | Ht 64.5 in | Wt 208.0 lb

## 2013-06-08 DIAGNOSIS — Z0000 Encounter for general adult medical examination without abnormal findings: Secondary | ICD-10-CM

## 2013-06-08 DIAGNOSIS — Z124 Encounter for screening for malignant neoplasm of cervix: Secondary | ICD-10-CM

## 2013-06-08 MED ORDER — QUETIAPINE FUMARATE 50 MG PO TABS
50.0000 mg | ORAL_TABLET | Freq: Every day | ORAL | Status: DC
Start: 2013-06-08 — End: 2013-07-07

## 2013-06-08 NOTE — Progress Notes (Signed)
Subjective:    Patient ID: Heidi Good, female    DOB: 1960/11/25, 53 y.o.   MRN: [id number]  HPI  53 year old hearing-impaired female, nonsmoker with a history of bipolar disorder is in today for complete physical exam. We originally started Lamictal for mood disorder where she was unable to tolerate the medication due to abdominal cramping and pain x2 days. She discontinued the medication and abdominal pain resolved.  Reviewed all health maintenance protocols including mammography colonoscopy bone density and reviewed appropriate screening labs. Her immunization history was reviewed as well as her current medications and allergies refills of her chronic medications were given and the plan for yearly health maintenance was discussed all orders and referrals were made as appropriate.    Review of Systems  Constitutional: Negative.   HENT: Negative.   Eyes: Negative.   Respiratory: Negative.   Cardiovascular: Negative.   Gastrointestinal: Negative.   Endocrine: Negative.   Genitourinary: Negative.   Musculoskeletal: Negative.   Skin: Negative.   Allergic/Immunologic: Negative.   Neurological: Negative.   Hematological: Negative.   Psychiatric/Behavioral: Negative.    Past Medical History  Diagnosis Date  . Constipation   . Gastroparesis   . Hypothyroidism   . Type II or unspecified type diabetes mellitus without mention of complication, not stated as uncontrolled   . Anxiety   . Hyperlipemia   . Depression   . Deafness     History   Social History  . Marital Status: Divorced    Spouse Name: N/A    Number of Children: 0  . Years of Education: N/A   Occupational History  . disablied    Social History Main Topics  . Smoking status: Never Smoker   . Smokeless tobacco: Never Used  . Alcohol Use: No  . Drug Use: No  . Sexual Activity: Not on file   Other Topics Concern  . Not on file   Social History Narrative  . No narrative on file    Past Surgical  History  Procedure Laterality Date  . Abdominal hysterectomy      Family History  Problem Relation Age of Onset  . Diabetes Mother   . Heart disease Father   . Colon cancer Neg Hx   . Cancer Mother     Benign  . Liver cancer Maternal Grandmother   . Heart attack Maternal Grandfather   . Emphysema Paternal Grandfather   . Multiple myeloma Father   . Emphysema Father   . Heart attack Father     Allergies  Allergen Reactions  . Doxycycline Nausea And Vomiting  . Other     Dog, cat, nuts, grass, trees    Current Outpatient Prescriptions on File Prior to Visit  Medication Sig Dispense Refill  . albuterol (PROVENTIL HFA;VENTOLIN HFA) 108 (90 BASE) MCG/ACT inhaler Inhale 2 puffs into the lungs every 6 (six) hours as needed for wheezing or shortness of breath.  1 Inhaler  prn  . albuterol (PROVENTIL) (2.5 MG/3ML) 0.083% nebulizer solution Take 3 mLs (2.5 mg total) by nebulization every 6 (six) hours as needed for wheezing or shortness of breath.  75 mL  12  . ALPRAZolam (XANAX) 0.5 MG tablet Take 0.5 mg by mouth 2 (two) times daily as needed.       Marland Kitchen estradiol (ESTRACE) 1 MG tablet Take 1 mg by mouth daily.        . fluticasone (FLONASE) 50 MCG/ACT nasal spray Place 2 sprays into the nose daily.  16 g  prn  . levothyroxine (SYNTHROID, LEVOTHROID) 75 MCG tablet Take 75 mcg by mouth daily.      Marland Kitchen loratadine (CLARITIN) 10 MG tablet Take 1 tablet (10 mg total) by mouth daily.  30 tablet  11  . mometasone-formoterol (DULERA) 100-5 MCG/ACT AERO Inhale 2 puffs into the lungs 2 (two) times daily.  1 Inhaler  prn  . simvastatin (ZOCOR) 20 MG tablet Take 20 mg by mouth at bedtime.         No current facility-administered medications on file prior to visit.    BP 140/70  Pulse 87  Ht 5' 4.5" (1.638 m)  Wt 208 lb (94.348 kg)  BMI 35.16 kg/m2chart     Objective:   Physical Exam  Constitutional: She is oriented to person, place, and time. She appears well-developed and well-nourished.    HENT:  Head: Normocephalic and atraumatic.  Right Ear: External ear normal.  Left Ear: External ear normal.  Nose: Nose normal.  Mouth/Throat: Oropharynx is clear and moist.  Eyes: Conjunctivae and EOM are normal. Pupils are equal, round, and reactive to light.  Neck: Normal range of motion. Neck supple.  Cardiovascular: Normal rate, regular rhythm and normal heart sounds.   Pulmonary/Chest: Effort normal and breath sounds normal. Right breast exhibits no inverted nipple, no mass, no nipple discharge, no skin change and no tenderness. Left breast exhibits no inverted nipple, no mass, no nipple discharge, no skin change and no tenderness. Breasts are symmetrical.  Abdominal: Soft. Bowel sounds are normal.  Genitourinary: Vagina normal and uterus normal. Guaiac negative stool. No vaginal discharge found.  Musculoskeletal: Normal range of motion. She exhibits no edema and no tenderness.  Neurological: She is alert and oriented to person, place, and time. She has normal reflexes. She displays normal reflexes. No cranial nerve deficit. Coordination normal.  Skin: Skin is warm and dry.  Psychiatric: She has a normal mood and affect.          Assessment & Plan:  Assessment:  1. CPX-Mammogram due in October, schedule accordingly. Refer for colonoscopy screen. 2. Bipolar Disorder- Start Seroquel 50 mg once daily at bedtime. Recheck in 3-4 weeks.  3. Hypercholesterolemia-Continue current meds

## 2013-06-08 NOTE — Patient Instructions (Signed)
Mood Disorders  Mood disorders are conditions that affect the way a person feels emotionally. The main mood disorders include:  · Depression.  · Bipolar disorder.  · Dysthymia. Dysthymia is a mild, lasting (chronic) depression. Symptoms of dysthymia are similar to depression, but not as severe.  · Cyclothymia. Cyclothymia includes mood swings, but the highs and lows are not as severe as they are in bipolar disorder. Symptoms of cyclothymia are similar to those of bipolar disorder, but less extreme.  CAUSES   Mood disorders are probably caused by a combination of factors. People with mood disorders seem to have physical and chemical changes in their brains. Mood disorders run in families, so there may be genetic causes. Severe trauma or stressful life events may also increase the risk of mood disorders.   SYMPTOMS   Symptoms of mood disorders depend on the specific type of condition.  Depression symptoms include:  · Feeling sad, worthless, or hopeless.  · Negative thoughts.  · Inability to enjoy one's usual activities.  · Low energy.  · Sleeping too much or too little.  · Appetite changes.  · Crying.  · Concentration problems.  · Thoughts of harming oneself.  Bipolar disorder symptoms include:  · Periods of depression (see above symptoms).  · Mood swings, from sadness and depression, to abnormal elation and excitement.  · Periods of mania:  · Racing thoughts.  · Fast speech.  · Poor judgment, and careless, dangerous choices.  · Decreased need for sleep.  · Risky behavior.  · Difficulty concentrating.  · Irritability.  · Increased energy.  · Increased sex drive.  DIAGNOSIS   There are no blood tests or X-rays that can confirm a mood disorder. However, your caregiver may choose to run some tests to make sure that there is not another physical cause for your symptoms. A mood disorder is usually diagnosed after an in-depth interview with a caregiver.  TREATMENT   Mood disorders can be treated with one or more of the  following:  · Medicine. This may include antidepressants, mood-stabilizers, or anti-psychotics.  · Psychotherapy (talk therapy).  · Cognitive behavioral therapy. You are taught to recognize negative thoughts and behavior patterns, and replace them with healthy thoughts and behaviors.  · Electroconvulsive therapy. For very severe cases of deep depression, a series of treatments in which an electrical current is applied to the brain.  · Vagus nerve stimulation. A pulse of electricity is applied to a portion of the brain.  · Transcranial magnetic stimulation. Powerful magnets are placed on the head that produce electrical currents.  · Hospitalization. In severe situations, or when someone is having serious thoughts of harming him or herself, hospitalization may be necessary in order to keep the person safe. This is also done to quickly start and monitor treatment.  HOME CARE INSTRUCTIONS   · Take your medicine exactly as directed.  · Attend all of your therapy sessions.  · Try to eat regular, healthy meals.  · Exercise daily. Exercise may improve mood symptoms.  · Get good sleep.  · Do not drink alcohol or use pot or other drugs. These can worsen mood symptoms and cause anxiety and psychosis.  · Tell your caregiver if you develop any side effects, such as feeling sick to your stomach (nauseous), dry mouth, dizziness, constipation, drowsiness, tremor, weight gain, or sexual symptoms. He or she may suggest things you can do to improve symptoms.  · Learn ways to cope with the stress of having a   chronic illness. This includes yoga, meditation, tai chi, or participating in a support group.  · Drink enough water to keep your urine clear or pale yellow. Eat a high-fiber diet. These habits may help you avoid constipation from your medicine.  SEEK IMMEDIATE MEDICAL CARE IF:  · Your mood worsens.  · You have thoughts of hurting yourself or others.  · You cannot care for yourself.  · You develop the sensation of hearing or seeing  something that is not actually present (auditory or visual hallucinations).  · You develop abnormal thoughts.  Document Released: 02/24/2009 Document Revised: 07/22/2011 Document Reviewed: 02/24/2009  [employer]® Patient Information ©2014 [employer].

## 2013-06-09 ENCOUNTER — Ambulatory Visit (INDEPENDENT_AMBULATORY_CARE_PROVIDER_SITE_OTHER): Payer: Medicare Other

## 2013-06-09 DIAGNOSIS — J309 Allergic rhinitis, unspecified: Secondary | ICD-10-CM | POA: Diagnosis not present

## 2013-06-14 ENCOUNTER — Ambulatory Visit: Payer: Medicare Other

## 2013-06-15 ENCOUNTER — Ambulatory Visit (INDEPENDENT_AMBULATORY_CARE_PROVIDER_SITE_OTHER): Payer: Medicare Other

## 2013-06-15 DIAGNOSIS — J309 Allergic rhinitis, unspecified: Secondary | ICD-10-CM | POA: Diagnosis not present

## 2013-06-16 ENCOUNTER — Encounter: Payer: Self-pay | Admitting: Gastroenterology

## 2013-06-21 ENCOUNTER — Ambulatory Visit (INDEPENDENT_AMBULATORY_CARE_PROVIDER_SITE_OTHER): Payer: Medicare Other

## 2013-06-21 DIAGNOSIS — J309 Allergic rhinitis, unspecified: Secondary | ICD-10-CM

## 2013-06-28 ENCOUNTER — Ambulatory Visit: Payer: Medicare Other

## 2013-07-05 ENCOUNTER — Ambulatory Visit (INDEPENDENT_AMBULATORY_CARE_PROVIDER_SITE_OTHER): Payer: Medicare Other

## 2013-07-05 DIAGNOSIS — J309 Allergic rhinitis, unspecified: Secondary | ICD-10-CM

## 2013-07-07 ENCOUNTER — Ambulatory Visit (INDEPENDENT_AMBULATORY_CARE_PROVIDER_SITE_OTHER): Payer: Medicare Other | Admitting: Family

## 2013-07-07 ENCOUNTER — Encounter: Payer: Self-pay | Admitting: Family

## 2013-07-07 VITALS — BP 110/80 | HR 97 | Wt 208.0 lb

## 2013-07-07 DIAGNOSIS — F319 Bipolar disorder, unspecified: Secondary | ICD-10-CM

## 2013-07-07 DIAGNOSIS — F411 Generalized anxiety disorder: Secondary | ICD-10-CM

## 2013-07-07 DIAGNOSIS — F32A Depression, unspecified: Secondary | ICD-10-CM

## 2013-07-07 DIAGNOSIS — F329 Major depressive disorder, single episode, unspecified: Secondary | ICD-10-CM

## 2013-07-07 DIAGNOSIS — F429 Obsessive-compulsive disorder, unspecified: Secondary | ICD-10-CM | POA: Diagnosis not present

## 2013-07-07 DIAGNOSIS — F3289 Other specified depressive episodes: Secondary | ICD-10-CM

## 2013-07-07 MED ORDER — ALPRAZOLAM 0.5 MG PO TABS: 0.5000 mg | ORAL_TABLET | Freq: Every evening | ORAL | Status: DC | PRN

## 2013-07-07 NOTE — Progress Notes (Signed)
Pre visit review using our clinic review tool, if applicable. No additional management support is needed unless otherwise documented below in the visit note. 

## 2013-07-07 NOTE — Progress Notes (Signed)
Subjective:    Patient ID: Heidi Good, female    DOB: 01-04-1961, 53 y.o.   MRN: [id number]  HPI 53 y.o. White female who presents today for a medication follow up after being started on Seroquel. Pt reports that she only took the Seroquel for 1 day, it made her to "tired" and she slept from "6-6". Pt says that Xanax is the only thing that helps calm her down and she takes it at night to sleep. Pt displayed flight of ideas, hallucination, paranoia and depression. She described events where a "man in black" would try to "get her my house to rape me, stab me and cut off my head". She also describes going to grocery store and that "everybody is looking at me like I am famous". Pt would consistently go back and forth between these two events. Her mother accompanied her and described similar paranoid events and that they have a house alarm, she says "no man in black exist". She also says pt is very "repetitive in all of her actions and words". PT denies thoughts of suicide, homoside. Denies hearing voices. Denies fever, chills, weight change.     Review of Systems  Constitutional: Positive for activity change.  HENT: Negative.   Eyes: Negative.   Respiratory: Negative.   Cardiovascular: Negative.   Gastrointestinal: Negative.   Endocrine: Negative.   Genitourinary: Negative.   Musculoskeletal: Negative.   Skin: Negative.   Allergic/Immunologic: Negative.   Neurological: Negative.   Hematological: Negative.   Psychiatric/Behavioral: Positive for hallucinations, confusion, sleep disturbance, decreased concentration and agitation. The patient is nervous/anxious.        Pt describes hallucination of man in black trying to hurt her. Describes multiple episodes of having everyone staring at her and "trying to get her". Reports she can only sleep when she takes xanax at night.    Past Medical History  Diagnosis Date  . Constipation   . Gastroparesis   . Hypothyroidism   . Type II or  unspecified type diabetes mellitus without mention of complication, not stated as uncontrolled   . Anxiety   . Hyperlipemia   . Depression   . Deafness     History   Social History  . Marital Status: Divorced    Spouse Name: N/A    Number of Children: 0  . Years of Education: N/A   Occupational History  . disablied    Social History Main Topics  . Smoking status: Never Smoker   . Smokeless tobacco: Never Used  . Alcohol Use: No  . Drug Use: No  . Sexual Activity: Not on file   Other Topics Concern  . Not on file   Social History Narrative  . No narrative on file    Past Surgical History  Procedure Laterality Date  . Abdominal hysterectomy      Family History  Problem Relation Age of Onset  . Diabetes Mother   . Heart disease Father   . Colon cancer Neg Hx   . Cancer Mother     Benign  . Liver cancer Maternal Grandmother   . Heart attack Maternal Grandfather   . Emphysema Paternal Grandfather   . Multiple myeloma Father   . Emphysema Father   . Heart attack Father     Allergies  Allergen Reactions  . Doxycycline Nausea And Vomiting  . Other     Dog, cat, nuts, grass, trees    Current Outpatient Prescriptions on File Prior to Visit  Medication Sig  Dispense Refill  . albuterol (PROVENTIL HFA;VENTOLIN HFA) 108 (90 BASE) MCG/ACT inhaler Inhale 2 puffs into the lungs every 6 (six) hours as needed for wheezing or shortness of breath.  1 Inhaler  prn  . albuterol (PROVENTIL) (2.5 MG/3ML) 0.083% nebulizer solution Take 3 mLs (2.5 mg total) by nebulization every 6 (six) hours as needed for wheezing or shortness of breath.  75 mL  12  . estradiol (ESTRACE) 1 MG tablet Take 1 mg by mouth daily.        . fluticasone (FLONASE) 50 MCG/ACT nasal spray Place 2 sprays into the nose daily.  16 g  prn  . levothyroxine (SYNTHROID, LEVOTHROID) 75 MCG tablet Take 75 mcg by mouth daily.      Marland Kitchen loratadine (CLARITIN) 10 MG tablet Take 1 tablet (10 mg total) by mouth daily.  30  tablet  11  . mometasone-formoterol (DULERA) 100-5 MCG/ACT AERO Inhale 2 puffs into the lungs 2 (two) times daily.  1 Inhaler  prn  . simvastatin (ZOCOR) 20 MG tablet Take 20 mg by mouth at bedtime.         No current facility-administered medications on file prior to visit.    BP 110/80  Pulse 97  Wt 208 lb (94.348 kg)chart    Objective:   Physical Exam  Constitutional: She is oriented to person, place, and time. She appears well-developed and well-nourished. She is active.  Cardiovascular: Normal rate, regular rhythm and normal heart sounds.   Pulmonary/Chest: Effort normal and breath sounds normal.  Neurological: She is alert and oriented to person, place, and time.  Skin: Skin is warm, dry and intact.  Psychiatric: Her mood appears anxious. Her speech is rapid and/or pressured. She is hyperactive. Thought content is paranoid and delusional. Cognition and memory are normal. She expresses impulsivity. She exhibits a depressed mood.          Assessment & Plan:  53 y.o. White female presents today for medication follow up for Seroquel.  - Bipolar, Anxiety, Depression, OCD, Parnoia  - Consult to Psychiatry ASAP. Information provided to pt and mother to contact for appointment.   - Pt has been on Lamictal and Seroquel and has failed treatment with both, she has been on Celexa before and states it "does not help".   - Continue Xanax 0.$RemoveBeforeDE'5mg'[id number]$ , however, pt is only going to take at night for sleep. I advised the patient that this medicine will not be regularly prescribed and is not going to help except for brief symptoms. She will not be given refills and is only given enough to help until she sees psychiatry.  - Education: Xanax is not a long term treatment, it will not help stabilized moods and is not the medication needed for her symptoms for long term control. Relaxation techniques.  - Follow up: Needs to see psychiatry ASAP, will follow up as needed.

## 2013-07-07 NOTE — Patient Instructions (Signed)
Depression, Adult Depression refers to feeling sad, low, down in the dumps, blue, gloomy, or empty. In general, there are two kinds of depression: 1. Depression that we all experience from time to time because of upsetting life experiences, including the loss of a job or the ending of a relationship (normal sadness or normal grief). This kind of depression is considered normal, is short lived, and resolves within a few days to 2 weeks. (Depression experienced after the loss of a loved one is called bereavement. Bereavement often lasts longer than 2 weeks but normally gets better with time.) 2. Clinical depression, which lasts longer than normal sadness or normal grief or interferes with your ability to function at home, at work, and in school. It also interferes with your personal relationships. It affects almost every aspect of your life. Clinical depression is an illness. Symptoms of depression also can be caused by conditions other than normal sadness and grief or clinical depression. Examples of these conditions are listed as follows:  Physical illness Some physical illnesses, including underactive thyroid gland (hypothyroidism), severe anemia, specific types of cancer, diabetes, uncontrolled seizures, heart and lung problems, strokes, and chronic pain are commonly associated with symptoms of depression.  Side effects of some prescription medicine In some people, certain types of prescription medicine can cause symptoms of depression.  Substance abuse Abuse of alcohol and illicit drugs can cause symptoms of depression. SYMPTOMS Symptoms of normal sadness and normal grief include the following:  Feeling sad or crying for short periods of time.  Not caring about anything (apathy).  Difficulty sleeping or sleeping too much.  No longer able to enjoy the things you used to enjoy.  Desire to be by oneself all the time (social isolation).  Lack of energy or motivation.  Difficulty  concentrating or remembering.  Change in appetite or weight.  Restlessness or agitation. Symptoms of clinical depression include the same symptoms of normal sadness or normal grief and also the following symptoms:  Feeling sad or crying all the time.  Feelings of guilt or worthlessness.  Feelings of hopelessness or helplessness.  Thoughts of suicide or the desire to harm yourself (suicidal ideation).  Loss of touch with reality (psychotic symptoms). Seeing or hearing things that are not real (hallucinations) or having false beliefs about your life or the people around you (delusions and paranoia). DIAGNOSIS  The diagnosis of clinical depression usually is based on the severity and duration of the symptoms. Your caregiver also will ask you questions about your medical history and substance use to find out if physical illness, use of prescription medicine, or substance abuse is causing your depression. Your caregiver also may order blood tests. TREATMENT  Typically, normal sadness and normal grief do not require treatment. However, sometimes antidepressant medicine is prescribed for bereavement to ease the depressive symptoms until they resolve. The treatment for clinical depression depends on the severity of your symptoms but typically includes antidepressant medicine, counseling with a mental health professional, or a combination of both. Your caregiver will help to determine what treatment is best for you. Depression caused by physical illness usually goes away with appropriate medical treatment of the illness. If prescription medicine is causing depression, talk with your caregiver about stopping the medicine, decreasing the dose, or substituting another medicine. Depression caused by abuse of alcohol or illicit drugs abuse goes away with abstinence from these substances. Some adults need professional help in order to stop drinking or using drugs. SEEK IMMEDIATE CARE IF:  You have   thoughts  about hurting yourself or others.  You lose touch with reality (have psychotic symptoms).  You are taking medicine for depression and have a serious side effect. FOR MORE INFORMATION National Alliance on Mental Illness: www.nami.[employer] of Mental [employer]: https://carter.com/ Document Released: 04/26/2000 Document Revised: 10/29/2011 Document Reviewed: 07/29/2011 [facility] Patient Information 2014 [place]. Paranoia Paranoia is a distrust of others that is not based on a real reason for distrust. This may reach delusional levels. This means the paranoid person feels the world is against them when there is no reason to make them feel that way. People with paranoia feel as though people around them are "out to get them".  SIMILAR MENTAL ILNESSES  Depression is a feeling as though you are down all the time. It is normal in some situations where you have just lost a loved one. It is abnormal if you are having feelings of paranoia with it.  Dementia is a physical problem with the brain in which the brain no longer works properly. There are problems with daily activities of living. Alzheimer's disease is one example of this. Dementia is also caused by old age changes in the brain which come with the death of brain cells and small strokes.  Paranoidschizophrenia. People with paranoid schizophrenia and persecutory delusional disorder have delusions in which they feel people around them are plotting against them. Persecutory delusions in paranoid schizophrenia are bizarre, sometimes grandiose, and often accompanied by auditory hallucinations. This means the person is hearing voices that are not there.  Delusionaldisorder (persecutory type). Delusions experienced by individuals with delusional disorder are more believable than those experienced by paranoid schizophrenics; they are not bizarre, though still unjustified. Individuals with delusional disorder may seem offbeat or quirky rather  than mentally ill, and therefore, may never seek treatment. All of these problems usually do not allow these people to interact socially in an acceptable manner. CAUSES The cause of paranoia is often not known. It is common in people with extended abuse of:  Cocaine.  Amphetamine.  Marijuana.  Alcohol. Sometimes there is an inherited tendency. It may be associated with stress or changes in brain chemistry. DIAGNOSIS  When paranoia is present, your caregiver may:  Refer you to a specialist.  Do a physical exam.  Perform other tests on you to make sure there are not other problems causing the paranoia including:  Physical problems.  Mental problems.  Chemical problems (other than drugs). Testing may be done to determine if there is a psychiatric disability present that can be treated with medicine. TREATMENT   Paranoia that is a symptom of a psychiatric problem should be treated by professionals.  Medicines are available which can help this disorder. Antipsychotic medicine may be prescribed by your caregiver.  Sometimes psychotherapy may be useful.  Conditions such as depression or drug abuse are treated individually. If the paranoia is caused by drug abuse, a treatment facility may be helpful. Depression may be helped by antidepressants. PROGNOSIS   Paranoid people are difficult to treat because of their belief that everyone is out to get them or harm them. Because of this mistrust, they often must be talked into entering treatment by a trusted family member or friend. They may not want to take medicine as they may see this as an attempt to poison them.  Gradual gains in the trust of a therapist or caregiver helps in a successful treatment plan.  Some people with PPD or persecutory delusional disorder function in society without  treatment in limited fashion. Document Released: 05/02/2003 Document Revised: 07/22/2011 Document Reviewed: 01/05/2008 [facility] Patient  Information 2014 [place].

## 2013-07-09 ENCOUNTER — Telehealth: Payer: Self-pay | Admitting: Family

## 2013-07-09 NOTE — Telephone Encounter (Signed)
Patient Information:  Caller Name: Heidi Good  Phone: [phone]  Patient: Heidi Good, Heidi Good  Gender: Female  DOB: December 10, 1960  Age: 53 Years  PCP: [facility])  Pregnant: No  Office Follow Up:  Does the office need to follow up with this patient?: Yes  Instructions For The Office: Plan of care  RN Note:  Aunt/ Heidi Good calling regarding Niece/Heidi Good c/o increase in Paranoia, confusion, agitation.  Office notified.  Per "Curt Bears"  advised Heidi Good someone from office would be calling her regarding plan of care.  Aunt agrees.  Symptoms  Reason For Call & Symptoms: paranoid, confusion, agitated  Reviewed Health History In EMR: Yes  Reviewed Medications In EMR: Yes  Reviewed Allergies In EMR: Yes  Reviewed Surgeries / Procedures: Yes  Date of Onset of Symptoms: 07/05/2013 OB / GYN:  LMP: Unknown  Guideline(s) Used:  Confusion - Delirium  Disposition Per Guideline:   Go to ED Now  Reason For Disposition Reached:   Bizarre or paranoid behavior  Advice Given:  Call Back If:  You (i.e., patient, family member) become worse.  RN Overrode Recommendation:  Follow Up With Office Later  Office notified per protocol. Per "Curt Bears"  office will return call to [place].

## 2013-07-09 NOTE — Telephone Encounter (Signed)
Advised pt's mom of Padonda's note and mom states that they will be coming to the office to pick up pt's medication that was left in the exam room on Wednesday

## 2013-07-09 NOTE — Telephone Encounter (Signed)
Go to ER

## 2013-07-12 ENCOUNTER — Ambulatory Visit (INDEPENDENT_AMBULATORY_CARE_PROVIDER_SITE_OTHER): Payer: Medicare Other

## 2013-07-12 DIAGNOSIS — J309 Allergic rhinitis, unspecified: Secondary | ICD-10-CM | POA: Diagnosis not present

## 2013-07-14 DIAGNOSIS — F259 Schizoaffective disorder, unspecified: Secondary | ICD-10-CM | POA: Diagnosis not present

## 2013-07-16 ENCOUNTER — Telehealth: Payer: Self-pay | Admitting: Gastroenterology

## 2013-07-16 NOTE — Telephone Encounter (Signed)
Pts mother states that her daughter was c/o abdominal pain and some constipation. She took a laxative there morning and states it keeps "working her." States she feels weak and has abdominal pain now. Discussed with the mother that some laxatives can cause abdominal cramping and make you continue having to go to the bathroom. Let her know to call us back if this problem continued after tomorrow. She verbalized understanding.

## 2013-07-17 ENCOUNTER — Encounter: Payer: Self-pay | Admitting: Family Medicine

## 2013-07-17 ENCOUNTER — Ambulatory Visit (INDEPENDENT_AMBULATORY_CARE_PROVIDER_SITE_OTHER): Payer: Medicare Other | Admitting: Family Medicine

## 2013-07-17 VITALS — BP 120/80 | HR 61 | Temp 99.5°F | Wt 203.8 lb

## 2013-07-17 DIAGNOSIS — K648 Other hemorrhoids: Secondary | ICD-10-CM | POA: Diagnosis not present

## 2013-07-17 DIAGNOSIS — Z862 Personal history of diseases of the blood and blood-forming organs and certain disorders involving the immune mechanism: Secondary | ICD-10-CM | POA: Diagnosis not present

## 2013-07-17 DIAGNOSIS — Z8639 Personal history of other endocrine, nutritional and metabolic disease: Secondary | ICD-10-CM

## 2013-07-17 DIAGNOSIS — K5900 Constipation, unspecified: Secondary | ICD-10-CM | POA: Diagnosis not present

## 2013-07-17 DIAGNOSIS — K5909 Other constipation: Secondary | ICD-10-CM | POA: Insufficient documentation

## 2013-07-17 NOTE — Patient Instructions (Signed)
64 oz of clear fluids every day  Milk of Magnesia 2 tbls in 4 oz of warm prune juice by mouth. If no Bowel movement (BM) Then in 2-4 hours Repeat and use a Dulcolax suppository in the rectum at the same time, if no BM  Then in 4 hours Repeat both.  If no BM then get Magnesium Citrate from pharmacy and use that tomorrow. Seek care if pain worsens.  Every day take a probiotic such as [place] or a generic And start a fiber supplement such as Benefiber twice a day mixed in a beverage     Constipation, Adult Constipation is when a person has fewer than 3 bowel movements a week; has difficulty having a bowel movement; or has stools that are dry, hard, or larger than normal. As people grow older, constipation is more common. If you try to fix constipation with medicines that make you have a bowel movement (laxatives), the problem may get worse. Long-term laxative use may cause the muscles of the colon to become weak. A low-fiber diet, not taking in enough fluids, and taking certain medicines may make constipation worse. CAUSES   Certain medicines, such as antidepressants, pain medicine, iron supplements, antacids, and water pills.   Certain diseases, such as diabetes, irritable bowel syndrome (IBS), thyroid disease, or depression.   Not drinking enough water.   Not eating enough fiber-rich foods.   Stress or travel.  Lack of physical activity or exercise.  Not going to the restroom when there is the urge to have a bowel movement.  Ignoring the urge to have a bowel movement.  Using laxatives too much. SYMPTOMS   Having fewer than 3 bowel movements a week.   Straining to have a bowel movement.   Having hard, dry, or larger than normal stools.   Feeling full or bloated.   Pain in the lower abdomen.  Not feeling relief after having a bowel movement. DIAGNOSIS  Your caregiver will take a medical history and perform a physical exam. Further testing may  be done for severe constipation. Some tests may include:   A barium enema X-ray to examine your rectum, colon, and sometimes, your small intestine.  A sigmoidoscopy to examine your lower colon.  A colonoscopy to examine your entire colon. TREATMENT  Treatment will depend on the severity of your constipation and what is causing it. Some dietary treatments include drinking more fluids and eating more fiber-rich foods. Lifestyle treatments may include regular exercise. If these diet and lifestyle recommendations do not help, your caregiver may recommend taking over-the-counter laxative medicines to help you have bowel movements. Prescription medicines may be prescribed if over-the-counter medicines do not work.  HOME CARE INSTRUCTIONS   Increase dietary fiber in your diet, such as fruits, vegetables, whole grains, and beans. Limit high-fat and processed sugars in your diet, such as [place] fries, hamburgers, cookies, candies, and soda.   A fiber supplement may be added to your diet if you cannot get enough fiber from foods.   Drink enough fluids to keep your urine clear or pale yellow.   Exercise regularly or as directed by your caregiver.   Go to the restroom when you have the urge to go. Do not hold it.  Only take medicines as directed by your caregiver. Do not take other medicines for constipation without talking to your caregiver first. [place] IF:   You have bright red blood in your stool.   Your constipation lasts for more  than 4 days or gets worse.   You have abdominal or rectal pain.   You have thin, pencil-like stools.  You have unexplained weight loss. MAKE SURE YOU:   Understand these instructions.  Will watch your condition.  Will get help right away if you are not doing well or get worse. Document Released: 01/26/2004 Document Revised: 07/22/2011 Document Reviewed: 02/08/2013 [facility] Patient Information 2014 [place], [place].

## 2013-07-17 NOTE — Assessment & Plan Note (Signed)
Has appointment with GI soon. For now encouraged to 2 tablespoons of milk of magnesia and 4 ounces of warm prune juice by mouth. If no response repeat with a Dulcolax suppository PR. Repeat in 4 hours if no results. If no results may fifth ray. Present ED his symptoms worsen. Once bowels have moved then start a daily probiotic and fiber twice a day. Increase

## 2013-07-17 NOTE — Assessment & Plan Note (Signed)
Patient denies any bleeding despite hard bowel movements and need for straining recently

## 2013-07-17 NOTE — Progress Notes (Signed)
Patient ID: Heidi Good, female   DOB: [birth year], 53 y.o.   MRN: [id number] ERIN OBANDO [id number] December 17, 1960 07/17/2013      Progress Note-Follow Up  Subjective  Chief Complaint  Chief Complaint  Patient presents with  . Abdominal Pain    & constipation x 2-3 days. Worse since yesterday. No N/V reported    HPI  Patient is a 53 year old female who is in today with her husband complaining of worsening constipation and abdominal pain. She has a bowel movement most days but it is small and very hard to pass. Until a month ago she was moving her bowels comfortably and regularly every day. She denies fevers or chills. She denies any change in medications or diet. Has abdominal cramping at times. Denies anorexia, nausea or vomiting. Denies fevers or chills. Denies any urinary complaints. No chest pain, palpitations or shortness of breath.  Past Medical History  Diagnosis Date  . Constipation   . Gastroparesis   . Hypothyroidism   . Type II or unspecified type diabetes mellitus without mention of complication, not stated as uncontrolled   . Anxiety   . Hyperlipemia   . Depression   . Deafness   . Unspecified constipation 07/17/2013    Past Surgical History  Procedure Laterality Date  . Abdominal hysterectomy      Family History  Problem Relation Age of Onset  . Diabetes Mother   . Heart disease Father   . Colon cancer Neg Hx   . Cancer Mother     Benign  . Liver cancer Maternal Grandmother   . Heart attack Maternal Grandfather   . Emphysema Paternal Grandfather   . Multiple myeloma Father   . Emphysema Father   . Heart attack Father     History   Social History  . Marital Status: Divorced    Spouse Name: N/A    Number of Children: 0  . Years of Education: N/A   Occupational History  . disablied    Social History Main Topics  . Smoking status: Never Smoker   . Smokeless tobacco: Never Used  . Alcohol Use: No  . Drug Use: No  . Sexual Activity: Not on file    Other Topics Concern  . Not on file   Social History Narrative  . No narrative on file    Current Outpatient Prescriptions on File Prior to Visit  Medication Sig Dispense Refill  . albuterol (PROVENTIL HFA;VENTOLIN HFA) 108 (90 BASE) MCG/ACT inhaler Inhale 2 puffs into the lungs every 6 (six) hours as needed for wheezing or shortness of breath.  1 Inhaler  prn  . albuterol (PROVENTIL) (2.5 MG/3ML) 0.083% nebulizer solution Take 3 mLs (2.5 mg total) by nebulization every 6 (six) hours as needed for wheezing or shortness of breath.  75 mL  12  . ALPRAZolam (XANAX) 0.5 MG tablet Take 1 tablet (0.5 mg total) by mouth at bedtime as needed for anxiety.  30 tablet  0  . estradiol (ESTRACE) 1 MG tablet Take 1 mg by mouth daily.        . fluticasone (FLONASE) 50 MCG/ACT nasal spray Place 2 sprays into the nose daily.  16 g  prn  . levothyroxine (SYNTHROID, LEVOTHROID) 75 MCG tablet Take 75 mcg by mouth daily.      Marland Kitchen loratadine (CLARITIN) 10 MG tablet Take 1 tablet (10 mg total) by mouth daily.  30 tablet  11  . mometasone-formoterol (DULERA) 100-5 MCG/ACT AERO Inhale 2 puffs into the  lungs 2 (two) times daily.  1 Inhaler  prn  . simvastatin (ZOCOR) 20 MG tablet Take 20 mg by mouth at bedtime.         No current facility-administered medications on file prior to visit.    Allergies  Allergen Reactions  . Doxycycline Nausea And Vomiting  . Other     Dog, cat, nuts, grass, trees    Review of Systems  Review of Systems  Constitutional: Negative for fever and malaise/fatigue.  HENT: Negative for congestion.   Eyes: Negative for discharge.  Respiratory: Negative for shortness of breath.   Cardiovascular: Negative for chest pain, palpitations and leg swelling.  Gastrointestinal: Negative for nausea, abdominal pain and diarrhea.  Genitourinary: Negative for dysuria.  Musculoskeletal: Negative for falls.  Skin: Negative for rash.  Neurological: Negative for loss of consciousness and  headaches.  Endo/Heme/Allergies: Negative for polydipsia.  Psychiatric/Behavioral: Negative for depression and suicidal ideas. The patient is not nervous/anxious and does not have insomnia.     Objective  BP 120/80  Pulse 61  Temp(Src) 99.5 F (37.5 C) (Oral)  Wt 203 lb 12 oz (92.42 kg)  SpO2 95%  Physical Exam  Physical Exam  Constitutional: She is oriented to person, place, and time and well-developed, well-nourished, and in no distress. No distress.  HENT:  Head: Normocephalic and atraumatic.  Eyes: Conjunctivae are normal.  Neck: Neck supple. No thyromegaly present.  Cardiovascular: Normal rate, regular rhythm and normal heart sounds.   No murmur heard. Pulmonary/Chest: Effort normal and breath sounds normal. She has no wheezes.  Abdominal: She exhibits no distension and no mass.  Musculoskeletal: She exhibits no edema.  Lymphadenopathy:    She has no cervical adenopathy.  Neurological: She is alert and oriented to person, place, and time.  Skin: Skin is warm and dry. No rash noted. She is not diaphoretic.  Psychiatric: Memory, affect and judgment normal.    Lab Results  Component Value Date   TSH 1.27 05/17/2013   Lab Results  Component Value Date   WBC 13.3* 05/17/2013   HGB 14.8 05/17/2013   HCT 43.2 05/17/2013   MCV 88.1 05/17/2013   PLT 242.0 05/17/2013   Lab Results  Component Value Date   CREATININE 0.9 05/17/2013   BUN 8 05/17/2013   NA 138 05/17/2013   K 4.4 05/17/2013   CL 101 05/17/2013   CO2 28 05/17/2013   Lab Results  Component Value Date   ALT 30 05/17/2013   AST 29 05/17/2013   ALKPHOS 88 05/17/2013   BILITOT 0.9 05/17/2013     Assessment & Plan  DIABETES MELLITUS, TYPE II, HX OF Well controlled recently per patient  Unspecified constipation Has appointment with GI soon. For now encouraged to 2 tablespoons of milk of magnesia and 4 ounces of warm prune juice by mouth. If no response repeat with a Dulcolax suppository PR. Repeat in 4 hours if no results. If no  results may fifth ray. Present ED his symptoms worsen. Once bowels have moved then start a daily probiotic and fiber twice a day. Increase  INTERNAL HEMORRHOIDS WITHOUT MENTION COMP Patient denies any bleeding despite hard bowel movements and need for straining recently

## 2013-07-17 NOTE — Assessment & Plan Note (Signed)
Well controlled recently per patient

## 2013-07-17 NOTE — Progress Notes (Signed)
Pre-visit discussion using our clinic review tool. No additional management support is needed unless otherwise documented below in the visit note.  

## 2013-07-19 ENCOUNTER — Ambulatory Visit (INDEPENDENT_AMBULATORY_CARE_PROVIDER_SITE_OTHER): Payer: Medicare Other

## 2013-07-19 DIAGNOSIS — J309 Allergic rhinitis, unspecified: Secondary | ICD-10-CM | POA: Diagnosis not present

## 2013-07-26 ENCOUNTER — Ambulatory Visit: Payer: Medicare Other

## 2013-07-26 ENCOUNTER — Telehealth: Payer: Self-pay | Admitting: *Deleted

## 2013-07-26 NOTE — Telephone Encounter (Signed)
Patient is scheduled for [facility] recall colonoscopy for 08/10/13. Last colonoscopy was 10/11/2008 at [facility], did not see recall date on report. Under letters patient was sent recall office visit letter back on 01/29/2012. Last office visit with you was 2012. Patient has recent phone note 07/16/13 for abdominal pain and constipation. Please review chart. Is recall colonoscopy due at this time? OR office visit? ( if so, recall diagnosis code please). Thanks

## 2013-07-26 NOTE — Telephone Encounter (Signed)
Spoke with patient's mother, explained to her that Dr.Kaplan wants patient to have office visit not colonoscopy at this time. Made office visit for April 28 at 11:00 am, cancelled colonoscopy and PV, mother notified. Also placed patient on waiting list for sooner appointment. Patient's mother explained that pt is having constipation.

## 2013-07-26 NOTE — Telephone Encounter (Signed)
Please have her return for an office visit

## 2013-07-27 ENCOUNTER — Ambulatory Visit (INDEPENDENT_AMBULATORY_CARE_PROVIDER_SITE_OTHER): Payer: Medicare Other

## 2013-07-27 DIAGNOSIS — J309 Allergic rhinitis, unspecified: Secondary | ICD-10-CM | POA: Diagnosis not present

## 2013-08-02 ENCOUNTER — Ambulatory Visit (INDEPENDENT_AMBULATORY_CARE_PROVIDER_SITE_OTHER): Payer: Medicare Other

## 2013-08-02 DIAGNOSIS — J309 Allergic rhinitis, unspecified: Secondary | ICD-10-CM | POA: Diagnosis not present

## 2013-08-04 DIAGNOSIS — E785 Hyperlipidemia, unspecified: Secondary | ICD-10-CM | POA: Diagnosis not present

## 2013-08-04 DIAGNOSIS — H9190 Unspecified hearing loss, unspecified ear: Secondary | ICD-10-CM | POA: Diagnosis not present

## 2013-08-04 DIAGNOSIS — D352 Benign neoplasm of pituitary gland: Secondary | ICD-10-CM | POA: Diagnosis not present

## 2013-08-04 DIAGNOSIS — R7301 Impaired fasting glucose: Secondary | ICD-10-CM | POA: Diagnosis not present

## 2013-08-04 DIAGNOSIS — E039 Hypothyroidism, unspecified: Secondary | ICD-10-CM | POA: Diagnosis not present

## 2013-08-04 DIAGNOSIS — G609 Hereditary and idiopathic neuropathy, unspecified: Secondary | ICD-10-CM | POA: Diagnosis not present

## 2013-08-05 DIAGNOSIS — H524 Presbyopia: Secondary | ICD-10-CM | POA: Diagnosis not present

## 2013-08-09 ENCOUNTER — Ambulatory Visit (INDEPENDENT_AMBULATORY_CARE_PROVIDER_SITE_OTHER): Payer: Medicare Other

## 2013-08-09 DIAGNOSIS — J309 Allergic rhinitis, unspecified: Secondary | ICD-10-CM | POA: Diagnosis not present

## 2013-08-10 ENCOUNTER — Encounter: Payer: Medicare Other | Admitting: Gastroenterology

## 2013-08-13 DIAGNOSIS — F259 Schizoaffective disorder, unspecified: Secondary | ICD-10-CM | POA: Diagnosis not present

## 2013-08-16 ENCOUNTER — Ambulatory Visit (INDEPENDENT_AMBULATORY_CARE_PROVIDER_SITE_OTHER): Payer: Medicare Other

## 2013-08-16 DIAGNOSIS — J309 Allergic rhinitis, unspecified: Secondary | ICD-10-CM

## 2013-08-17 ENCOUNTER — Ambulatory Visit (INDEPENDENT_AMBULATORY_CARE_PROVIDER_SITE_OTHER): Payer: Medicare Other

## 2013-08-17 DIAGNOSIS — J309 Allergic rhinitis, unspecified: Secondary | ICD-10-CM

## 2013-08-23 ENCOUNTER — Ambulatory Visit (INDEPENDENT_AMBULATORY_CARE_PROVIDER_SITE_OTHER): Payer: Medicare Other

## 2013-08-23 DIAGNOSIS — J309 Allergic rhinitis, unspecified: Secondary | ICD-10-CM

## 2013-08-30 ENCOUNTER — Ambulatory Visit (INDEPENDENT_AMBULATORY_CARE_PROVIDER_SITE_OTHER): Payer: Medicare Other

## 2013-08-30 DIAGNOSIS — J309 Allergic rhinitis, unspecified: Secondary | ICD-10-CM | POA: Diagnosis not present

## 2013-09-06 ENCOUNTER — Ambulatory Visit: Payer: Medicare Other

## 2013-09-07 ENCOUNTER — Ambulatory Visit (INDEPENDENT_AMBULATORY_CARE_PROVIDER_SITE_OTHER): Payer: Medicare Other

## 2013-09-07 ENCOUNTER — Ambulatory Visit (INDEPENDENT_AMBULATORY_CARE_PROVIDER_SITE_OTHER): Payer: Medicare Other | Admitting: Gastroenterology

## 2013-09-07 ENCOUNTER — Encounter: Payer: Self-pay | Admitting: Gastroenterology

## 2013-09-07 VITALS — BP 102/66 | HR 64 | Ht 64.0 in | Wt 201.0 lb

## 2013-09-07 DIAGNOSIS — K589 Irritable bowel syndrome without diarrhea: Secondary | ICD-10-CM | POA: Diagnosis not present

## 2013-09-07 DIAGNOSIS — J309 Allergic rhinitis, unspecified: Secondary | ICD-10-CM | POA: Diagnosis not present

## 2013-09-07 DIAGNOSIS — K3184 Gastroparesis: Secondary | ICD-10-CM | POA: Diagnosis not present

## 2013-09-07 NOTE — Progress Notes (Signed)
_                                                                                                                History of Present Illness: 53 year old white female with diabetes, deafness, gastroparesis referred for evaluation of constipation.  She has taken Amitiza for constipation but this causes diarrhea.  If she does not move her bowels daily she develops crampy lower abdominal pain  , nausea and vomiting.  She currently has the urge to move her bowels but is unable to evacuate them.  There is no history of rectal bleeding.  Colonoscopy in 2010 demonstrated internal hemorrhoids and diverticulosis.    Past Medical History  Diagnosis Date  . Constipation   . Gastroparesis   . Hypothyroidism   . Type II or unspecified type diabetes mellitus without mention of complication, not stated as uncontrolled   . Anxiety   . Hyperlipemia   . Depression   . Deafness   . Unspecified constipation 07/17/2013   Past Surgical History  Procedure Laterality Date  . Abdominal hysterectomy     family history includes Cancer in her mother; Diabetes in her mother; Emphysema in her father and paternal grandfather; Heart attack in her father and maternal grandfather; Heart disease in her father; Liver cancer in her maternal grandmother; Multiple myeloma in her father. There is no history of Colon cancer. Current Outpatient Prescriptions  Medication Sig Dispense Refill  . albuterol (PROVENTIL HFA;VENTOLIN HFA) 108 (90 BASE) MCG/ACT inhaler Inhale 2 puffs into the lungs every 6 (six) hours as needed for wheezing or shortness of breath.  1 Inhaler  prn  . albuterol (PROVENTIL) (2.5 MG/3ML) 0.083% nebulizer solution Take 3 mLs (2.5 mg total) by nebulization every 6 (six) hours as needed for wheezing or shortness of breath.  75 mL  12  . ALPRAZolam (XANAX) 0.5 MG tablet Take 1 tablet (0.5 mg total) by mouth at bedtime as needed for anxiety.  30 tablet  0  . ARIPiprazole (ABILIFY) 20 MG tablet  Take 20 mg by mouth daily.      Marland Kitchen estradiol (ESTRACE) 1 MG tablet Take 1 mg by mouth daily.        . fluticasone (FLONASE) 50 MCG/ACT nasal spray Place 2 sprays into the nose daily.  16 g  prn  . levothyroxine (SYNTHROID, LEVOTHROID) 75 MCG tablet Take 75 mcg by mouth daily.      Marland Kitchen loratadine (CLARITIN) 10 MG tablet Take 1 tablet (10 mg total) by mouth daily.  30 tablet  11  . mometasone-formoterol (DULERA) 100-5 MCG/ACT AERO Inhale 2 puffs into the lungs 2 (two) times daily.  1 Inhaler  prn  . simvastatin (ZOCOR) 20 MG tablet Take 20 mg by mouth at bedtime.         No current facility-administered medications for this visit.   Allergies as of 09/07/2013 - Review Complete 09/07/2013  Allergen Reaction Noted  . Doxycycline Nausea And Vomiting 03/09/2013  . Other      reports that  she has never smoked. She has never used smokeless tobacco. She reports that she does not drink alcohol or use illicit drugs.     Review of Systems: Pertinent positive and negative review of systems were noted in the above HPI section. All other review of systems were otherwise negative.  Vital signs were reviewed in today's medical record Physical Exam: General: Well developed , well nourished, no acute distress Skin: anicteric Head: Normocephalic and atraumatic Eyes:  sclerae anicteric, EOMI Ears: Normal auditory acuity Mouth: No deformity or lesions Neck: Supple, no masses or thyromegaly Lungs: Clear throughout to auscultation Heart: Regular rate and rhythm; no murmurs, rubs or bruits Abdomen: Soft, non tender and non distended. No masses, hepatosplenomegaly or hernias noted. Normal Bowel sounds.  There is no succussion splash  Rectal:deferred Musculoskeletal: Symmetrical with no gross deformities  Skin: No lesions on visible extremities Pulses:  Normal pulses noted Extremities: No clubbing, cyanosis, edema or deformities noted Neurological: Alert oriented x 4, grossly nonfocal Cervical Nodes:  No  significant cervical adenopathy Inguinal Nodes: No significant inguinal adenopathy Psychological:  Alert and cooperative. Normal mood and affect  See Assessment and Plan under Problem List

## 2013-09-07 NOTE — Assessment & Plan Note (Signed)
She doesn't appear to be symptomatic from gastroparesis since nausea and vomiting seem to be related to the inability to move her bowels.  If nausea and vomiting persist despite moving her bowels or with consider trying her again on a promotility agent such as metoclopramide.

## 2013-09-07 NOTE — Patient Instructions (Signed)
Linzess samples 232mcg take one every day Follow up in 6 weeks

## 2013-09-07 NOTE — Assessment & Plan Note (Signed)
The patient's underlying problem is probably IBS with both components of diarrhea and constipation she primarily seems to be more constipation predominant, characterized by abdominal pain, nausea and vomiting that seems to be relieved with defecation.  Amitiza is successful in moving her bowels but she tends to get diarrhea.  The patient describes constipation if she doesn't have a daily bowel movement.  I believe that she is slightly fixated on this.    Recommendations #1 trial of Linzess 290 micrograms daily.  If she develops diarrhea she may have to reduce the dose and frequency

## 2013-09-14 ENCOUNTER — Ambulatory Visit (INDEPENDENT_AMBULATORY_CARE_PROVIDER_SITE_OTHER): Payer: Medicare Other

## 2013-09-14 DIAGNOSIS — J309 Allergic rhinitis, unspecified: Secondary | ICD-10-CM

## 2013-09-20 ENCOUNTER — Ambulatory Visit: Payer: Medicare Other

## 2013-09-21 ENCOUNTER — Ambulatory Visit (INDEPENDENT_AMBULATORY_CARE_PROVIDER_SITE_OTHER)
Admission: RE | Admit: 2013-09-21 | Discharge: 2013-09-21 | Disposition: A | Discharge status: Home / Self Care | Payer: Medicare Other | Source: Ambulatory Visit | Attending: Internal Medicine | Admitting: Internal Medicine

## 2013-09-21 ENCOUNTER — Ambulatory Visit (INDEPENDENT_AMBULATORY_CARE_PROVIDER_SITE_OTHER): Payer: Medicare Other

## 2013-09-21 ENCOUNTER — Encounter: Payer: Self-pay | Admitting: Internal Medicine

## 2013-09-21 ENCOUNTER — Ambulatory Visit (INDEPENDENT_AMBULATORY_CARE_PROVIDER_SITE_OTHER): Payer: Medicare Other | Admitting: Internal Medicine

## 2013-09-21 VITALS — BP 136/68 | HR 75 | Ht 64.5 in | Wt 204.0 lb

## 2013-09-21 DIAGNOSIS — J309 Allergic rhinitis, unspecified: Secondary | ICD-10-CM

## 2013-09-21 DIAGNOSIS — J4520 Mild intermittent asthma, uncomplicated: Secondary | ICD-10-CM

## 2013-09-21 DIAGNOSIS — J45909 Unspecified asthma, uncomplicated: Secondary | ICD-10-CM

## 2013-09-21 MED ORDER — ALBUTEROL SULFATE HFA 108 (90 BASE) MCG/ACT IN AERS: 2.0000 | INHALATION_SPRAY | Freq: Four times a day (QID) | RESPIRATORY_TRACT | Status: DC | PRN

## 2013-09-21 NOTE — Progress Notes (Signed)
53 yoFnever smoker, self-referred for help with snoring. I had seen her for asthma as a teenager.         Mother here Deaf since meningitis as an infant, but able to hear snoring which wakes her. Wakes frequently. No sleep walking or movement disorder, but gets up to bathroom or to eat repeatedly. Then tired next day and naps. Mother has OSA. Chronic allergic rhinitis and asthma. Has been on allergy shots from Dr Velora Heckler since age 53. Needs rescue inhaler. Bedtime around 9:30 PM but has difficulty initiating and maintaining sleep. Easily disturbed. May try to sleep as late as 10:30 in the morning. Cough and throat tickle blamed on allergic rhinitis with postnasal drip. Takes Nasalcrom. No ENT surgery.  10/11/11- 53 yoF never smoker, deaf,  self-referred for help with snoring. I had seen her for allergic asthma as a teenager.          Mother here. She had been on allergy shots for many years and finally stopped last week. They describe coughing fits. Over-the-counter cough syrups did not help. Eyes itch. Some sneezing. Occasional use of rescue inhaler.  OSA-unattended home sleep study on 08/27/2011 confirmed mild obstructive sleep apnea, AHI of 14.1 per hour with moderate snoring and oxygen desaturation to 81%. She returns now after CPAP autotitration. Good control and compliance. Titrated to a best pressure range of 10.4. She is tolerating initial CPAP pretty well. Today we discussed alternatives and how to work with the home care company ([facility]).  11/22/11- 53 yoF never smoker, deaf,  Followed for OSA/ CPAP. I had seen her for allergic asthma as a teenager. Mother here. Wears CPAP every night-still has cool feelings from pressure in machine; lower pressure to 5.0 as it was feeling like too much. CPAP 11/ [facility]. I think she had turned a humidifier or the ramp, thinking she was changing the pressure. Cough is productive of scant clear sputum with no wheezing noted by mother. Has used rescue inhaler a  few times but has not needed her nebulizer machine.  02/24/12- 53 yoF never smoker, deaf,  Followed for OSA/ CPAP               Mother here- signing translator Pt states she is doing okay with Dulera; new sleep study scheduled for 03-17-12. Her original study was an unattended home study done because she is deaf. Her CPAP had been picked up by DME because of poor compliance and insurance requires update sleep study documenting she needs it. She says she was afraid of it so she would take it off after a couple of hours every night. We're giving a booklet and discussing again the medical issues.  04/21/12- 53 yoF never smoker, deaf,  Followed for OSA/ CPAP    Here w/ boy friend and signing translator FOLLOWS FOR: review sleep study with patient from 03-17-12 NPSG 03/17/12- AHI  2.5/ hr, WNL. Normal oxygenation.  I discussed normal range of respiratory events as being less than or equal to 5 per hour. She says the biggest sleep problem at home is that her parents keep the house much too hot. She does not need CPAP.   06/16/2012  53 yoF never smoker, deaf,  Mild intermittent asthma  Complains of prod cough with white mucus x3weeks  Finished prednisone by CY with no improvement.   Here with signing translator Patient denies any hemoptysis, chest pain, orthopnea, PND, or leg swelling. Patient has not been using any over-the-counter products for cough and congestion. She  says that her mucus is very thick, causing her to cough quite often. Does interfere with her sleep. Previous sleep study neg for OSA .   09/21/12- 53 yoF never smoker, deaf,  Mild intermittent asthma    Signing translator  here ACUTE VISIT: started Friday with symptoms-pollen got in nose; by the afternoon had drainage. cough started Saturday and making her feel worse. Felt worse today and tried Advil for pain and only slight relief. Overall feels bad. She does not feel she has a cold. Has been out of nasal spray.  10/06/12-- 53 yoF never  smoker, deaf,  Mild intermittent asthma    Research officer, trade [employer] and Mother here ACUTE VISIT: still having a cough (alot) since last visit; clear in color; can't tell that the Depo injection form 09-21-12 helped at all. Chest is sore some per patient. Nasal drainage-clear in color. Persistent cough now for 3 weeks. They're anxious because father died of pneumonia. She is coughing clear mucus with no fever. Admits some heartburn and shortness of breath. We discussed reflux.  She and her mother are interested in restarting allergy vaccine. CXR 06/19/12 IMPRESSION: Several vertebral bodies show mild endplate concavity.  Question a degree of osteoporosis. No edema or consolidation.  Original Report Authenticated By: Lowella Grip, M.D.  03/02/13- 53 yoF never smoker, deaf,  Mild intermittent asthma    Signing translator  Here ACUTE VISIT:  Cough w/ brown and yellow mucus x2 days Nasal congestion, chest congestion, no sore throat. Low-grade fever. Had flu vaccine October 7. Using nebulizer. Had 3 days of nausea and vomiting now resolved. Mother is at home, not sick. CXR 10/06/12 IMPRESSION:  No acute abnormalities.  Original Report Authenticated By: Lavonia Dana, M.D.  03/24/13- 53 yoF never smoker, deaf/ lip reading,  Mild intermittent asthma     FOLLOWS FOR: still on vaccine and doing well with it; states she is having slight cough. Allergy vaccine build-up here going well, no problems. Feels "fine" today. Cough is incidental and does not feel sick.   05/10/13- 53 yoF never smoker, deaf/ lip reading,  Mild intermittent asthma   ACUTE VISIT: Pt had someone at her house cleaning over the weekend-had to change the air filter and started to feel tightness in chest. They are scheduled to change and clean air filters/vents today. Deafness translator here. Increased cough and nasal congestion noticed after HVAC filter changes at home yesterday. Asthma flare. Out of cough medicine. Nebulizer medicine expired.  She has a sample Ventolin rescue inhaler.  09/21/13- 53 yoF never smoker, (deaf/ lip reading),  Mild intermittent asthma, allergi rhinitis complicated by DM2, resolved OSA,      Mother and signing interpreter here. FOLLOWS FOR: c/o intermittent nonprod cough.  Has started using flonase and taking honey with warm water.  States that her symptoms have improved greatly. Tolerating allergy injections well.   Little dry cough. Likes Flonase. They ask about updating CXr because of cough.  ROS-see HPI Constitutional:   No-   weight loss, night sweats, fevers, chills,+ fatigue, lassitude. HEENT:   No-  headaches, difficulty swallowing, tooth/dental problems, sore throat, She is deaf- reads lips and signs.     No- sneezing, itching, ear ache, +nasal congestion, post nasal drip,  CV:  No-   chest pain, orthopnea, PND, swelling in lower extremities, anasarca, dizziness, palpitations Resp: + shortness of breath with exertion or at rest. No- productive cough, + nonproductive cough Skin: No-   rash or lesions. GI:  + heartburn, indigestion, abdominal pain,  nausea, vomiting,  GU:  MS:  No-   joint pain or swelling.   Neuro-     nothing unusual Psych:  No- change in mood or affect. No depression or anxiety.  No memory loss.   OBJ- Physical Exam General- Alert, Oriented, Affect-appropriate, Distress- none acute. Looks well Skin- rash-none, lesions- none, excoriation- none Lymphadenopathy- none Head- atraumatic            Eyes- Gross vision intact, PERRLA, conjunctivae and secretions clear            Ears- deaf            Nose- Clear, no-Septal dev, mucus, polyps, erosion, perforation             Throat- Mallampati II , mucosa+ red , drainage- none, tonsils- atrophic Neck- flexible , trachea midline, no stridor , thyroid nl, carotid no bruit Chest - symmetrical excursion , unlabored           Heart/CV- RRR , no murmur , no gallop  , no rub, nl s1 s2                           - JVD- none , edema- none,  stasis changes- none, varices- none           Lung- clear to P&A, wheeze- none, cough+dry, dullness-none, rub- none           Chest wall-  Abd- Br/ Gen/ Rectal- Not done, not indicated Extrem- cyanosis- none, clubbing, none, atrophy- none, strength- nl Neuro- grossly intact to observation

## 2013-09-21 NOTE — Patient Instructions (Addendum)
Script for [employer] rescue inhaler sent to drug store  Order- CXR   Dx asthma  I am glad you are doing so well Heidi Good. Please call if needed

## 2013-09-21 NOTE — Assessment & Plan Note (Signed)
Mild seasonal exacerbation but generally controlled.

## 2013-09-21 NOTE — Assessment & Plan Note (Signed)
Plan- refill Proair, order CXR for cough

## 2013-09-24 NOTE — Progress Notes (Signed)
Quick Note:  atc X1, wcb ______

## 2013-09-27 ENCOUNTER — Ambulatory Visit (INDEPENDENT_AMBULATORY_CARE_PROVIDER_SITE_OTHER): Payer: Medicare Other

## 2013-09-27 DIAGNOSIS — J309 Allergic rhinitis, unspecified: Secondary | ICD-10-CM

## 2013-09-30 NOTE — Progress Notes (Signed)
Quick Note:  Called and spoke to pt's mother, Arbie Cookey. Arbie Cookey verbalized understanding and stated she would relay the message to Ms. Kosel and denied any further questions or concerns at this time. ______

## 2013-10-05 ENCOUNTER — Ambulatory Visit (INDEPENDENT_AMBULATORY_CARE_PROVIDER_SITE_OTHER): Payer: Medicare Other

## 2013-10-05 DIAGNOSIS — J309 Allergic rhinitis, unspecified: Secondary | ICD-10-CM | POA: Diagnosis not present

## 2013-10-11 ENCOUNTER — Ambulatory Visit (INDEPENDENT_AMBULATORY_CARE_PROVIDER_SITE_OTHER): Payer: Medicare Other

## 2013-10-11 DIAGNOSIS — J309 Allergic rhinitis, unspecified: Secondary | ICD-10-CM

## 2013-10-12 ENCOUNTER — Encounter: Payer: Self-pay | Admitting: Internal Medicine

## 2013-10-18 ENCOUNTER — Ambulatory Visit: Payer: Medicare Other

## 2013-10-19 ENCOUNTER — Ambulatory Visit (INDEPENDENT_AMBULATORY_CARE_PROVIDER_SITE_OTHER): Payer: Medicare Other

## 2013-10-19 DIAGNOSIS — J309 Allergic rhinitis, unspecified: Secondary | ICD-10-CM

## 2013-10-25 ENCOUNTER — Ambulatory Visit (INDEPENDENT_AMBULATORY_CARE_PROVIDER_SITE_OTHER): Payer: Medicare Other

## 2013-10-25 DIAGNOSIS — J309 Allergic rhinitis, unspecified: Secondary | ICD-10-CM | POA: Diagnosis not present

## 2013-10-28 DIAGNOSIS — D239 Other benign neoplasm of skin, unspecified: Secondary | ICD-10-CM | POA: Diagnosis not present

## 2013-10-28 DIAGNOSIS — L909 Atrophic disorder of skin, unspecified: Secondary | ICD-10-CM | POA: Diagnosis not present

## 2013-10-28 DIAGNOSIS — L919 Hypertrophic disorder of the skin, unspecified: Secondary | ICD-10-CM | POA: Diagnosis not present

## 2013-10-28 DIAGNOSIS — L821 Other seborrheic keratosis: Secondary | ICD-10-CM | POA: Diagnosis not present

## 2013-10-28 DIAGNOSIS — D485 Neoplasm of uncertain behavior of skin: Secondary | ICD-10-CM | POA: Diagnosis not present

## 2013-10-28 DIAGNOSIS — D1801 Hemangioma of skin and subcutaneous tissue: Secondary | ICD-10-CM | POA: Diagnosis not present

## 2013-11-01 ENCOUNTER — Ambulatory Visit (INDEPENDENT_AMBULATORY_CARE_PROVIDER_SITE_OTHER): Payer: Medicare Other

## 2013-11-01 DIAGNOSIS — J309 Allergic rhinitis, unspecified: Secondary | ICD-10-CM | POA: Diagnosis not present

## 2013-11-05 ENCOUNTER — Ambulatory Visit (INDEPENDENT_AMBULATORY_CARE_PROVIDER_SITE_OTHER): Payer: Medicare Other | Admitting: Gastroenterology

## 2013-11-05 ENCOUNTER — Encounter: Payer: Self-pay | Admitting: Gastroenterology

## 2013-11-05 VITALS — BP 130/70 | HR 72 | Ht 64.0 in | Wt 201.1 lb

## 2013-11-05 DIAGNOSIS — K5900 Constipation, unspecified: Secondary | ICD-10-CM

## 2013-11-05 NOTE — Progress Notes (Signed)
          History of Present Illness:  The patient reports significant improvement in her constipation since starting Linzess.  She's moving her bowels regularly and does not complaining of abdominal pain.    Review of Systems: Pertinent positive and negative review of systems were noted in the above HPI section. All other review of systems were otherwise negative.    Current Medications, Allergies, Past Medical History, Past Surgical History, Family History and Social History were reviewed in [place] record  Vital signs were reviewed in today's medical record. Physical Exam: General: Well developed , well nourished, no acute distress Skin: anicteric   See Assessment and Plan under Problem List

## 2013-11-05 NOTE — Patient Instructions (Signed)
Follow up as needed

## 2013-11-05 NOTE — Assessment & Plan Note (Signed)
Excellent response to higher dose Linzess.  Plan to continue with the same.

## 2013-11-08 ENCOUNTER — Ambulatory Visit (INDEPENDENT_AMBULATORY_CARE_PROVIDER_SITE_OTHER): Payer: Medicare Other

## 2013-11-08 DIAGNOSIS — J309 Allergic rhinitis, unspecified: Secondary | ICD-10-CM | POA: Diagnosis not present

## 2013-11-09 DIAGNOSIS — F259 Schizoaffective disorder, unspecified: Secondary | ICD-10-CM | POA: Diagnosis not present

## 2013-11-15 ENCOUNTER — Ambulatory Visit (INDEPENDENT_AMBULATORY_CARE_PROVIDER_SITE_OTHER): Payer: Medicare Other

## 2013-11-15 DIAGNOSIS — J309 Allergic rhinitis, unspecified: Secondary | ICD-10-CM

## 2013-11-17 DIAGNOSIS — E282 Polycystic ovarian syndrome: Secondary | ICD-10-CM | POA: Diagnosis not present

## 2013-11-17 DIAGNOSIS — Z6833 Body mass index (BMI) 33.0-33.9, adult: Secondary | ICD-10-CM | POA: Diagnosis not present

## 2013-11-17 DIAGNOSIS — R7301 Impaired fasting glucose: Secondary | ICD-10-CM | POA: Diagnosis not present

## 2013-11-17 DIAGNOSIS — D353 Benign neoplasm of craniopharyngeal duct: Secondary | ICD-10-CM | POA: Diagnosis not present

## 2013-11-17 DIAGNOSIS — Z79899 Other long term (current) drug therapy: Secondary | ICD-10-CM | POA: Diagnosis not present

## 2013-11-17 DIAGNOSIS — E039 Hypothyroidism, unspecified: Secondary | ICD-10-CM | POA: Diagnosis not present

## 2013-11-17 DIAGNOSIS — E785 Hyperlipidemia, unspecified: Secondary | ICD-10-CM | POA: Diagnosis not present

## 2013-11-17 DIAGNOSIS — G609 Hereditary and idiopathic neuropathy, unspecified: Secondary | ICD-10-CM | POA: Diagnosis not present

## 2013-11-17 DIAGNOSIS — D352 Benign neoplasm of pituitary gland: Secondary | ICD-10-CM | POA: Diagnosis not present

## 2013-11-17 DIAGNOSIS — R82998 Other abnormal findings in urine: Secondary | ICD-10-CM | POA: Diagnosis not present

## 2013-11-22 ENCOUNTER — Ambulatory Visit (INDEPENDENT_AMBULATORY_CARE_PROVIDER_SITE_OTHER): Payer: Medicare Other

## 2013-11-22 DIAGNOSIS — J309 Allergic rhinitis, unspecified: Secondary | ICD-10-CM | POA: Diagnosis not present

## 2013-12-06 ENCOUNTER — Ambulatory Visit (INDEPENDENT_AMBULATORY_CARE_PROVIDER_SITE_OTHER): Payer: Medicare Other

## 2013-12-06 DIAGNOSIS — J309 Allergic rhinitis, unspecified: Secondary | ICD-10-CM | POA: Diagnosis not present

## 2013-12-13 ENCOUNTER — Ambulatory Visit (INDEPENDENT_AMBULATORY_CARE_PROVIDER_SITE_OTHER): Payer: Medicare Other

## 2013-12-13 DIAGNOSIS — J309 Allergic rhinitis, unspecified: Secondary | ICD-10-CM

## 2013-12-20 ENCOUNTER — Ambulatory Visit (INDEPENDENT_AMBULATORY_CARE_PROVIDER_SITE_OTHER): Payer: Medicare Other

## 2013-12-20 DIAGNOSIS — J309 Allergic rhinitis, unspecified: Secondary | ICD-10-CM

## 2013-12-27 ENCOUNTER — Ambulatory Visit (INDEPENDENT_AMBULATORY_CARE_PROVIDER_SITE_OTHER): Payer: Medicare Other

## 2013-12-27 DIAGNOSIS — J309 Allergic rhinitis, unspecified: Secondary | ICD-10-CM

## 2014-01-03 ENCOUNTER — Ambulatory Visit (INDEPENDENT_AMBULATORY_CARE_PROVIDER_SITE_OTHER): Payer: Medicare Other

## 2014-01-03 DIAGNOSIS — J309 Allergic rhinitis, unspecified: Secondary | ICD-10-CM | POA: Diagnosis not present

## 2014-01-10 ENCOUNTER — Ambulatory Visit (INDEPENDENT_AMBULATORY_CARE_PROVIDER_SITE_OTHER): Payer: Medicare Other

## 2014-01-10 DIAGNOSIS — J309 Allergic rhinitis, unspecified: Secondary | ICD-10-CM | POA: Diagnosis not present

## 2014-01-12 ENCOUNTER — Encounter: Payer: Self-pay | Admitting: Internal Medicine

## 2014-01-14 DIAGNOSIS — F259 Schizoaffective disorder, unspecified: Secondary | ICD-10-CM | POA: Diagnosis not present

## 2014-01-18 ENCOUNTER — Ambulatory Visit (INDEPENDENT_AMBULATORY_CARE_PROVIDER_SITE_OTHER): Payer: Medicare Other

## 2014-01-18 DIAGNOSIS — J309 Allergic rhinitis, unspecified: Secondary | ICD-10-CM

## 2014-01-21 ENCOUNTER — Other Ambulatory Visit: Payer: Self-pay | Admitting: Internal Medicine

## 2014-01-21 DIAGNOSIS — Z1231 Encounter for screening mammogram for malignant neoplasm of breast: Secondary | ICD-10-CM

## 2014-01-25 ENCOUNTER — Ambulatory Visit: Payer: Medicare Other

## 2014-01-26 ENCOUNTER — Ambulatory Visit (INDEPENDENT_AMBULATORY_CARE_PROVIDER_SITE_OTHER): Payer: Medicare Other

## 2014-01-26 DIAGNOSIS — Z23 Encounter for immunization: Secondary | ICD-10-CM | POA: Diagnosis not present

## 2014-01-26 DIAGNOSIS — J309 Allergic rhinitis, unspecified: Secondary | ICD-10-CM | POA: Diagnosis not present

## 2014-02-02 ENCOUNTER — Ambulatory Visit: Payer: Medicare Other

## 2014-02-07 ENCOUNTER — Ambulatory Visit (INDEPENDENT_AMBULATORY_CARE_PROVIDER_SITE_OTHER): Payer: Medicare Other

## 2014-02-07 ENCOUNTER — Telehealth: Payer: Self-pay | Admitting: Internal Medicine

## 2014-02-07 DIAGNOSIS — J309 Allergic rhinitis, unspecified: Secondary | ICD-10-CM

## 2014-02-07 NOTE — Telephone Encounter (Signed)
Lets have patient come in to see TP; I have attempted to call the mother x 4 and line busy.

## 2014-02-07 NOTE — Telephone Encounter (Signed)
OV with TP for tomorrow at 3:30 pm

## 2014-02-07 NOTE — Telephone Encounter (Signed)
Spoke with the pt's mother Arbie Cookey  She states that the pt is having increased SOB and wheezing for the past 2 days  She is coughing, but mother unsure if this is prod or not  She states that pt told her that she has been having to wake up in the night to use rescue inhaler and neb  No appts with [facility] tomorrow  Please advise, thanks! Allergies  Allergen Reactions  . Doxycycline Nausea And Vomiting  . Other     Dog, cat, nuts, grass, trees   Current Outpatient Prescriptions on File Prior to Visit  Medication Sig Dispense Refill  . albuterol (PROVENTIL HFA;VENTOLIN HFA) 108 (90 BASE) MCG/ACT inhaler Inhale 2 puffs into the lungs every 6 (six) hours as needed for wheezing or shortness of breath.  1 Inhaler  prn  . albuterol (PROVENTIL) (2.5 MG/3ML) 0.083% nebulizer solution Take 3 mLs (2.5 mg total) by nebulization every 6 (six) hours as needed for wheezing or shortness of breath.  75 mL  12  . ALPRAZolam (XANAX) 0.5 MG tablet Take 1 tablet (0.5 mg total) by mouth at bedtime as needed for anxiety.  30 tablet  0  . ARIPiprazole (ABILIFY) 20 MG tablet Take 20 mg by mouth daily.      Marland Kitchen estradiol (ESTRACE) 1 MG tablet Take 1 mg by mouth daily.        . fluticasone (FLONASE) 50 MCG/ACT nasal spray Place 2 sprays into the nose daily.  16 g  prn  . levothyroxine (SYNTHROID, LEVOTHROID) 75 MCG tablet Take 75 mcg by mouth daily.      . Linaclotide (LINZESS) 290 MCG CAPS capsule Take 290 mcg by mouth daily.      Marland Kitchen loperamide (IMODIUM) 2 MG capsule Take 2 mg by mouth as needed for diarrhea or loose stools.      . mometasone-formoterol (DULERA) 100-5 MCG/ACT AERO Inhale 2 puffs into the lungs 2 (two) times daily.  1 Inhaler  prn  . simvastatin (ZOCOR) 20 MG tablet Take 20 mg by mouth at bedtime.         No current facility-administered medications on file prior to visit.   Last ov 09/21/13  Next ov 09/23/14

## 2014-02-08 ENCOUNTER — Encounter: Payer: Self-pay | Admitting: Adult Health

## 2014-02-08 ENCOUNTER — Ambulatory Visit (INDEPENDENT_AMBULATORY_CARE_PROVIDER_SITE_OTHER): Payer: Medicare Other | Admitting: Adult Health

## 2014-02-08 VITALS — BP 126/72 | HR 77 | Temp 98.8°F | Ht 64.0 in | Wt 200.6 lb

## 2014-02-08 DIAGNOSIS — J4521 Mild intermittent asthma with (acute) exacerbation: Secondary | ICD-10-CM

## 2014-02-08 DIAGNOSIS — J45901 Unspecified asthma with (acute) exacerbation: Secondary | ICD-10-CM | POA: Diagnosis not present

## 2014-02-08 NOTE — Progress Notes (Signed)
71 yoFnever smoker, self-referred for help with snoring. I had seen her for asthma as a teenager.         Mother here Deaf since meningitis as an infant, but able to hear snoring which wakes her. Wakes frequently. No sleep walking or movement disorder, but gets up to bathroom or to eat repeatedly. Then tired next day and naps. Mother has OSA. Chronic allergic rhinitis and asthma. Has been on allergy shots from Dr Velora Heckler since age 53. Needs rescue inhaler. Bedtime around 9:30 PM but has difficulty initiating and maintaining sleep. Easily disturbed. May try to sleep as late as 10:30 in the morning. Cough and throat tickle blamed on allergic rhinitis with postnasal drip. Takes Nasalcrom. No ENT surgery.  10/11/11- 59 yoF never smoker, deaf,  self-referred for help with snoring. I had seen her for allergic asthma as a teenager.          Mother here. She had been on allergy shots for many years and finally stopped last week. They describe coughing fits. Over-the-counter cough syrups did not help. Eyes itch. Some sneezing. Occasional use of rescue inhaler.  OSA-unattended home sleep study on 08/27/2011 confirmed mild obstructive sleep apnea, AHI of 14.1 per hour with moderate snoring and oxygen desaturation to 81%. She returns now after CPAP autotitration. Good control and compliance. Titrated to a best pressure range of 10.4. She is tolerating initial CPAP pretty well. Today we discussed alternatives and how to work with the home care company ([facility]).  11/22/11- 34 yoF never smoker, deaf,  Followed for OSA/ CPAP. I had seen her for allergic asthma as a teenager. Mother here. Wears CPAP every night-still has cool feelings from pressure in machine; lower pressure to 5.0 as it was feeling like too much. CPAP 11/ [facility]. I think she had turned a humidifier or the ramp, thinking she was changing the pressure. Cough is productive of scant clear sputum with no wheezing noted by mother. Has used rescue inhaler a  few times but has not needed her nebulizer machine.  02/24/12- 81 yoF never smoker, deaf,  Followed for OSA/ CPAP               Mother here- signing translator Pt states she is doing okay with Dulera; new sleep study scheduled for 03-17-12. Her original study was an unattended home study done because she is deaf. Her CPAP had been picked up by DME because of poor compliance and insurance requires update sleep study documenting she needs it. She says she was afraid of it so she would take it off after a couple of hours every night. We're giving a booklet and discussing again the medical issues.  04/21/12- 54 yoF never smoker, deaf,  Followed for OSA/ CPAP    Here w/ boy friend and signing translator FOLLOWS FOR: review sleep study with patient from 03-17-12 NPSG 03/17/12- AHI  2.5/ hr, WNL. Normal oxygenation.  I discussed normal range of respiratory events as being less than or equal to 5 per hour. She says the biggest sleep problem at home is that her parents keep the house much too hot. She does not need CPAP.   06/16/2012  51 yoF never smoker, deaf,  Mild intermittent asthma  Complains of prod cough with white mucus x3weeks  Finished prednisone by CY with no improvement.   Here with signing translator Patient denies any hemoptysis, chest pain, orthopnea, PND, or leg swelling. Patient has not been using any over-the-counter products for cough and congestion. She  says that her mucus is very thick, causing her to cough quite often. Does interfere with her sleep. Previous sleep study neg for OSA .   09/21/12- 9 yoF never smoker, deaf,  Mild intermittent asthma    Signing translator  here ACUTE VISIT: started Friday with symptoms-pollen got in nose; by the afternoon had drainage. cough started Saturday and making her feel worse. Felt worse today and tried Advil for pain and only slight relief. Overall feels bad. She does not feel she has a cold. Has been out of nasal spray.  10/06/12-- 48 yoF never  smoker, deaf,  Mild intermittent asthma    Research officer, trade union and Mother here ACUTE VISIT: still having a cough (alot) since last visit; clear in color; can't tell that the Depo injection form 09-21-12 helped at all. Chest is sore some per patient. Nasal drainage-clear in color. Persistent cough now for 3 weeks. They're anxious because father died of pneumonia. She is coughing clear mucus with no fever. Admits some heartburn and shortness of breath. We discussed reflux.  She and her mother are interested in restarting allergy vaccine. CXR 06/19/12 IMPRESSION: Several vertebral bodies show mild endplate concavity.  Question a degree of osteoporosis. No edema or consolidation.  Original Report Authenticated By: Lowella Grip, M.D.  03/02/13- 64 yoF never smoker, deaf,  Mild intermittent asthma    Signing translator  Here ACUTE VISIT:  Cough w/ brown and yellow mucus x2 days Nasal congestion, chest congestion, no sore throat. Low-grade fever. Had flu vaccine October 7. Using nebulizer. Had 3 days of nausea and vomiting now resolved. Mother is at home, not sick. CXR 10/06/12 IMPRESSION:  No acute abnormalities.  Original Report Authenticated By: Lavonia Dana, M.D.  03/24/13- 7 yoF never smoker, deaf/ lip reading,  Mild intermittent asthma     FOLLOWS FOR: still on vaccine and doing well with it; states she is having slight cough. Allergy vaccine build-up here going well, no problems. Feels "fine" today. Cough is incidental and does not feel sick.   05/10/13- 47 yoF never smoker, deaf/ lip reading,  Mild intermittent asthma   ACUTE VISIT: Pt had someone at her house cleaning over the weekend-had to change the air filter and started to feel tightness in chest. They are scheduled to change and clean air filters/vents today. Deafness translator here. Increased cough and nasal congestion noticed after HVAC filter changes at home yesterday. Asthma flare. Out of cough medicine. Nebulizer medicine expired.  She has a sample Ventolin rescue inhaler.  09/21/13- 50 yoF never smoker, (deaf/ lip reading),  Mild intermittent asthma, allergi rhinitis complicated by DM2, resolved OSA,      Mother and signing interpreter here. FOLLOWS FOR: c/o intermittent nonprod cough.  Has started using flonase and taking honey with warm water.  States that her symptoms have improved greatly. Tolerating allergy injections well.   Little dry cough. Likes Flonase. They ask about updating CXr because of cough.  02/08/2014 Follow up Asthma  Here with interpretor   never smoker, (deaf/ lip reading),  Mild intermittent asthma, allergi rhinitis complicated by DM2, resolved OSA,    Had episode of increased SOB, wheezing  dry cough x1 night on 9/27.  took Mucinex DM and albuterol neb with total resolution of symptoms.  Feels she is back to baseline with no recurrent symptoms.   did miss allergy injection last week, wonders if this caused symptoms. Got allergy vaccine yesterday without trouble  Denies hemoptysis , chest pain, orthopnea, edema or fever.  ROS-see HPI Constitutional:   No-   weight loss, night sweats, fevers, chills,  fatigue, lassitude. HEENT:   No-  headaches, difficulty swallowing, tooth/dental problems, sore throat, She is deaf- reads lips and signs.     No- sneezing, itching, ear ache, +nasal congestion, post nasal drip,  CV:  No-   chest pain, orthopnea, PND, swelling in lower extremities, anasarca, dizziness, palpitations Resp:No- productive cough, + nonproductive cough Skin: No-   rash or lesions. GI:  No  heartburn, indigestion, abdominal pain, nausea, vomiting,  GU:  MS:  No-   joint pain or swelling.   Neuro-     nothing unusual Psych:  No- change in mood or affect. No depression or anxiety.  No memory loss.   OBJ- Physical Exam General- Alert, Oriented, Affect-appropriate, Distress- none acute. Looks well Skin- rash-none, lesions- none, excoriation- none Lymphadenopathy- none Head- atraumatic             Eyes- Gross vision intact, PERRLA, conjunctivae and secretions clear            Ears- deaf            Nose- Clear, no-Septal dev, mucus, polyps, erosion, perforation             Throat- Mallampati II , mucosa+ red , drainage- none, tonsils- atrophic Neck- flexible , trachea midline, no stridor , thyroid nl, carotid no bruit Chest - symmetrical excursion , unlabored           Heart/CV- RRR , no murmur , no gallop  , no rub, nl s1 s2                           - JVD- none , edema- none, stasis changes- none, varices- none           Lung- clear to P&A, wheeze- none,  rub- none           Chest wall-  Abd- Br/ Gen/ Rectal- Not done, not indicated Extrem- cyanosis- none, clubbing, none, atrophy- none, strength- nl Neuro- grossly intact to observation

## 2014-02-08 NOTE — Assessment & Plan Note (Signed)
Mild flare with AR ,now resolved -no recurrent symptoms Cont on current regimen  follow up Dr. Annamaria Boots  In 4 months and As needed   Please contact office for sooner follow up if symptoms do not improve or worsen or seek emergency care

## 2014-02-08 NOTE — Patient Instructions (Addendum)
Continue on current regimen  follow up Dr. Annamaria Boots  In 4 months and As needed   Please contact office for sooner follow up if symptoms do not improve or worsen or seek emergency care

## 2014-02-14 ENCOUNTER — Ambulatory Visit (INDEPENDENT_AMBULATORY_CARE_PROVIDER_SITE_OTHER): Payer: Medicare Other

## 2014-02-14 DIAGNOSIS — J309 Allergic rhinitis, unspecified: Secondary | ICD-10-CM

## 2014-02-18 ENCOUNTER — Other Ambulatory Visit: Payer: Self-pay | Admitting: Internal Medicine

## 2014-02-18 ENCOUNTER — Ambulatory Visit: Payer: Medicare Other

## 2014-02-18 ENCOUNTER — Ambulatory Visit
Admission: RE | Admit: 2014-02-18 | Discharge: 2014-02-18 | Disposition: A | Discharge status: Home / Self Care | Payer: Medicare Other | Source: Ambulatory Visit | Attending: Internal Medicine | Admitting: Internal Medicine

## 2014-02-18 DIAGNOSIS — Z1231 Encounter for screening mammogram for malignant neoplasm of breast: Secondary | ICD-10-CM | POA: Diagnosis not present

## 2014-02-21 ENCOUNTER — Ambulatory Visit (INDEPENDENT_AMBULATORY_CARE_PROVIDER_SITE_OTHER): Payer: Medicare Other

## 2014-02-21 DIAGNOSIS — J309 Allergic rhinitis, unspecified: Secondary | ICD-10-CM

## 2014-02-28 ENCOUNTER — Ambulatory Visit (INDEPENDENT_AMBULATORY_CARE_PROVIDER_SITE_OTHER): Payer: Medicare Other

## 2014-02-28 DIAGNOSIS — J309 Allergic rhinitis, unspecified: Secondary | ICD-10-CM | POA: Diagnosis not present

## 2014-03-07 ENCOUNTER — Ambulatory Visit (INDEPENDENT_AMBULATORY_CARE_PROVIDER_SITE_OTHER): Payer: Medicare Other

## 2014-03-07 DIAGNOSIS — J309 Allergic rhinitis, unspecified: Secondary | ICD-10-CM | POA: Diagnosis not present

## 2014-03-10 ENCOUNTER — Telehealth: Payer: Self-pay | Admitting: Internal Medicine

## 2014-03-10 MED ORDER — AZITHROMYCIN 250 MG PO TABS: ORAL_TABLET | ORAL | Status: DC

## 2014-03-10 MED ORDER — PREDNISONE 10 MG PO TABS: ORAL_TABLET | ORAL | Status: DC

## 2014-03-10 NOTE — Telephone Encounter (Signed)
Pt's mother states patient has not felt well since Monday of this week; deep and wheezy cough-clear in color, chest tightness as well. Pt has been using her nebulizer with no relief. Last used this morning at 9:30am. I could hear patient in the back ground having a hard time with her cough.  Pt had her allergy shot on Monday as well. She has also had her flu shot for this season.   Allergies  Allergen Reactions  . Doxycycline Nausea And Vomiting  . Other     Dog, cat, nuts, grass, trees    CY please advise. Thanks.

## 2014-03-10 NOTE — Telephone Encounter (Signed)
Offer Zpak          prednisone 10 mg, # 20, 4 X 2 DAYS, 3 X 2 DAYS, 2 X 2 DAYS, 1 X 2 DAYS

## 2014-03-10 NOTE — Telephone Encounter (Signed)
Spoke with pt's mother-aware of rx's from CY and sent to [facility] [address]. Nothing more needed at this time.

## 2014-03-14 ENCOUNTER — Ambulatory Visit: Payer: Medicare Other

## 2014-03-16 ENCOUNTER — Encounter: Payer: Self-pay | Admitting: Internal Medicine

## 2014-03-18 ENCOUNTER — Telehealth: Payer: Self-pay | Admitting: Internal Medicine

## 2014-03-18 ENCOUNTER — Ambulatory Visit (INDEPENDENT_AMBULATORY_CARE_PROVIDER_SITE_OTHER): Payer: Medicare Other

## 2014-03-18 DIAGNOSIS — J309 Allergic rhinitis, unspecified: Secondary | ICD-10-CM

## 2014-03-18 NOTE — Telephone Encounter (Signed)
Offer prometh codeine 200 ml,  5 ml every 6 hours if needed for cough          Amoxacillin 500 mg, # 21, 1 three times daily

## 2014-03-18 NOTE — Telephone Encounter (Signed)
lmomtcb x1 for [employer]

## 2014-03-18 NOTE — Telephone Encounter (Signed)
Last OV 02/08/14. I spoke with the pt mother and she states the pt still has a lot of coughing. She was given a zpak and prednisone taper on 03-10-14.  She states the cough is not productive anymore. She states the pt does still have some wheezing but this is better. She is not sure if she is having any chest tightness. The pt is set to have an allergy shot today. Pt is requesting an RX for a cough syrup, and wants to know if it is ok for the pt to get an allergy shot? Please advise. [place] Bing, CMA Allergies  Allergen Reactions  . Doxycycline Nausea And Vomiting  . Other     Dog, cat, nuts, grass, trees

## 2014-03-21 MED ORDER — PROMETHAZINE-CODEINE 6.25-10 MG/5ML PO SYRP: 5.0000 mL | ORAL_SOLUTION | Freq: Four times a day (QID) | ORAL | Status: DC | PRN

## 2014-03-21 MED ORDER — AMOXICILLIN 500 MG PO CAPS: 500.0000 mg | ORAL_CAPSULE | Freq: Three times a day (TID) | ORAL | Status: DC

## 2014-03-21 NOTE — Telephone Encounter (Signed)
LMTCB

## 2014-03-21 NOTE — Telephone Encounter (Signed)
Pts mother called back and she i spoke with her.  She is aware of meds that will be called to the pharmacy for the pt.  She also stated that the pt did come in on Friday for her allergy vaccine and CY stated that it will be ok for the pt to come this week for her next allergy vaccine. pts mother voiced her understanding and nothing further is needed.

## 2014-03-25 ENCOUNTER — Ambulatory Visit (INDEPENDENT_AMBULATORY_CARE_PROVIDER_SITE_OTHER): Payer: Medicare Other

## 2014-03-25 DIAGNOSIS — J309 Allergic rhinitis, unspecified: Secondary | ICD-10-CM | POA: Diagnosis not present

## 2014-04-01 ENCOUNTER — Ambulatory Visit (INDEPENDENT_AMBULATORY_CARE_PROVIDER_SITE_OTHER): Payer: Medicare Other

## 2014-04-01 DIAGNOSIS — J309 Allergic rhinitis, unspecified: Secondary | ICD-10-CM

## 2014-04-05 ENCOUNTER — Ambulatory Visit (INDEPENDENT_AMBULATORY_CARE_PROVIDER_SITE_OTHER): Payer: Medicare Other

## 2014-04-05 DIAGNOSIS — J309 Allergic rhinitis, unspecified: Secondary | ICD-10-CM

## 2014-04-11 ENCOUNTER — Ambulatory Visit (INDEPENDENT_AMBULATORY_CARE_PROVIDER_SITE_OTHER): Payer: Medicare Other

## 2014-04-11 DIAGNOSIS — J309 Allergic rhinitis, unspecified: Secondary | ICD-10-CM | POA: Diagnosis not present

## 2014-04-12 ENCOUNTER — Ambulatory Visit: Payer: Medicare Other

## 2014-04-18 ENCOUNTER — Ambulatory Visit (INDEPENDENT_AMBULATORY_CARE_PROVIDER_SITE_OTHER): Payer: Medicare Other

## 2014-04-18 DIAGNOSIS — J309 Allergic rhinitis, unspecified: Secondary | ICD-10-CM

## 2014-04-20 ENCOUNTER — Ambulatory Visit (INDEPENDENT_AMBULATORY_CARE_PROVIDER_SITE_OTHER): Payer: Medicare Other

## 2014-04-20 DIAGNOSIS — J309 Allergic rhinitis, unspecified: Secondary | ICD-10-CM | POA: Diagnosis not present

## 2014-04-25 ENCOUNTER — Encounter: Payer: Self-pay | Admitting: Internal Medicine

## 2014-04-25 ENCOUNTER — Ambulatory Visit: Payer: Medicare Other | Admitting: Internal Medicine

## 2014-04-25 ENCOUNTER — Ambulatory Visit: Payer: Medicare Other

## 2014-04-25 VITALS — BP 98/56 | HR 91 | Ht 64.0 in

## 2014-04-25 DIAGNOSIS — J4521 Mild intermittent asthma with (acute) exacerbation: Secondary | ICD-10-CM

## 2014-04-25 MED ORDER — METHYLPREDNISOLONE ACETATE 80 MG/ML IJ SUSP
80.0000 mg | Freq: Once | INTRAMUSCULAR | Status: AC
  Administered 2014-04-25: 80 mg via INTRAMUSCULAR

## 2014-04-25 MED ORDER — AZITHROMYCIN 250 MG PO TABS: ORAL_TABLET | ORAL | Status: DC

## 2014-04-25 NOTE — Progress Notes (Signed)
53 yoFnever smoker, self-referred for help with snoring. I had seen her for asthma as a teenager.         Mother here Deaf since meningitis as an infant, but able to hear snoring which wakes her. Wakes frequently. No sleep walking or movement disorder, but gets up to bathroom or to eat repeatedly. Then tired next day and naps. Mother has OSA. Chronic allergic rhinitis and asthma. Has been on allergy shots from Dr Velora Heckler since age 53. Needs rescue inhaler. Bedtime around 9:30 PM but has difficulty initiating and maintaining sleep. Easily disturbed. May try to sleep as late as 10:30 in the morning. Cough and throat tickle blamed on allergic rhinitis with postnasal drip. Takes Nasalcrom. No ENT surgery.  10/11/11- 53 yoF never smoker, deaf,  self-referred for help with snoring. I had seen her for allergic asthma as a teenager.          Mother here. She had been on allergy shots for many years and finally stopped last week. They describe coughing fits. Over-the-counter cough syrups did not help. Eyes itch. Some sneezing. Occasional use of rescue inhaler.  OSA-unattended home sleep study on 08/27/2011 confirmed mild obstructive sleep apnea, AHI of 14.1 per hour with moderate snoring and oxygen desaturation to 81%. She returns now after CPAP autotitration. Good control and compliance. Titrated to a best pressure range of 10.4. She is tolerating initial CPAP pretty well. Today we discussed alternatives and how to work with the home care company ([facility]).  11/22/11- 53 yoF never smoker, deaf,  Followed for OSA/ CPAP. I had seen her for allergic asthma as a teenager. Mother here. Wears CPAP every night-still has cool feelings from pressure in machine; lower pressure to 5.0 as it was feeling like too much. CPAP 11/ [facility]. I think she had turned a humidifier or the ramp, thinking she was changing the pressure. Cough is productive of scant clear sputum with no wheezing noted by mother. Has used rescue inhaler a  few times but has not needed her nebulizer machine.  02/24/12- 53 yoF never smoker, deaf,  Followed for OSA/ CPAP               Mother here- signing translator Pt states she is doing okay with Dulera; new sleep study scheduled for 03-17-12. Her original study was an unattended home study done because she is deaf. Her CPAP had been picked up by DME because of poor compliance and insurance requires update sleep study documenting she needs it. She says she was afraid of it so she would take it off after a couple of hours every night. We're giving a booklet and discussing again the medical issues.  04/21/12- 53 yoF never smoker, deaf,  Followed for OSA/ CPAP    Here w/ boy friend and signing translator FOLLOWS FOR: review sleep study with patient from 03-17-12 NPSG 03/17/12- AHI  2.5/ hr, WNL. Normal oxygenation.  I discussed normal range of respiratory events as being less than or equal to 5 per hour. She says the biggest sleep problem at home is that her parents keep the house much too hot. She does not need CPAP.   06/16/2012  53 yoF never smoker, deaf,  Mild intermittent asthma  Complains of prod cough with white mucus x3weeks  Finished prednisone by CY with no improvement.   Here with signing translator Patient denies any hemoptysis, chest pain, orthopnea, PND, or leg swelling. Patient has not been using any over-the-counter products for cough and congestion. She  says that her mucus is very thick, causing her to cough quite often. Does interfere with her sleep. Previous sleep study neg for OSA .   09/21/12- 53 yoF never smoker, deaf,  Mild intermittent asthma    Signing translator  here ACUTE VISIT: started Friday with symptoms-pollen got in nose; by the afternoon had drainage. cough started Saturday and making her feel worse. Felt worse today and tried Advil for pain and only slight relief. Overall feels bad. She does not feel she has a cold. Has been out of nasal spray.  10/06/12-- 53 yoF never  smoker, deaf,  Mild intermittent asthma    Research officer, trade union and Mother here ACUTE VISIT: still having a cough (alot) since last visit; clear in color; can't tell that the Depo injection form 09-21-12 helped at all. Chest is sore some per patient. Nasal drainage-clear in color. Persistent cough now for 3 weeks. They're anxious because father died of pneumonia. She is coughing clear mucus with no fever. Admits some heartburn and shortness of breath. We discussed reflux.  She and her mother are interested in restarting allergy vaccine. CXR 06/19/12 IMPRESSION: Several vertebral bodies show mild endplate concavity.  Question a degree of osteoporosis. No edema or consolidation.  Original Report Authenticated By: Lowella Grip, M.D.  03/02/13- 12 yoF never smoker, deaf,  Mild intermittent asthma    Signing translator  Here ACUTE VISIT:  Cough w/ brown and yellow mucus x2 days Nasal congestion, chest congestion, no sore throat. Low-grade fever. Had flu vaccine October 7. Using nebulizer. Had 3 days of nausea and vomiting now resolved. Mother is at home, not sick. CXR 10/06/12 IMPRESSION:  No acute abnormalities.  Original Report Authenticated By: Lavonia Dana, M.D.  03/24/13- 53 yoF never smoker, deaf/ lip reading,  Mild intermittent asthma     FOLLOWS FOR: still on vaccine and doing well with it; states she is having slight cough. Allergy vaccine build-up here going well, no problems. Feels "fine" today. Cough is incidental and does not feel sick.   05/10/13- 53 yoF never smoker, deaf/ lip reading,  Mild intermittent asthma   ACUTE VISIT: Pt had someone at her house cleaning over the weekend-had to change the air filter and started to feel tightness in chest. They are scheduled to change and clean air filters/vents today. Deafness translator here. Increased cough and nasal congestion noticed after HVAC filter changes at home yesterday. Asthma flare. Out of cough medicine. Nebulizer medicine expired.  She has a sample Ventolin rescue inhaler.  09/21/13- 13 yoF never smoker, (deaf/ lip reading),  Mild intermittent asthma, allergi rhinitis complicated by DM2, resolved OSA,      Mother and signing interpreter here. FOLLOWS FOR: c/o intermittent nonprod cough.  Has started using flonase and taking honey with warm water.  States that her symptoms have improved greatly. Tolerating allergy injections well.   Little dry cough. Likes Flonase. They ask about updating CXr because of cough.  02/08/2014 Follow up Asthma  Here with interpretor   never smoker, (deaf/ lip reading),  Mild intermittent asthma, allergi rhinitis complicated by DM2, resolved OSA,    Had episode of increased SOB, wheezing  dry cough x1 night on 9/27.  took Mucinex DM and albuterol neb with total resolution of symptoms.  Feels she is back to baseline with no recurrent symptoms.   did miss allergy injection last week, wonders if this caused symptoms. Got allergy vaccine yesterday without trouble  Denies hemoptysis , chest pain, orthopnea, edema or fever.  04/25/14- 12 yoF never smoker, (deaf/ lip reading),  Mild intermittent asthma, allergi rhinitis complicated by DM2, resolved OSA,      Mother and signing interpreter here. ACUTE VISIT: Sore chest from cough-yellow, sinus drainage as well since Saturday. Nose is sore and red; drinking plenty of fluids to keep from getting dried out.   CXR 09/21/13 IMPRESSION: No active disease. Electronically Signed  By: Aletta Edouard M.D.  On: 09/21/2013 16:09  ROS-see HPI Constitutional:   No-   weight loss, night sweats, fevers, chills,  fatigue, lassitude. HEENT:   No-  headaches, difficulty swallowing, tooth/dental problems, sore throat, She is deaf- reads lips and signs.     No- sneezing, itching, ear ache, +nasal congestion, post nasal drip,  CV:  No-   chest pain, orthopnea, PND, swelling in lower extremities, anasarca, dizziness, palpitations Resp:No- productive cough, +  nonproductive cough Skin: No-   rash or lesions. GI:  No  heartburn, indigestion, abdominal pain, nausea, vomiting,  GU:  MS:  No-   joint pain or swelling.   Neuro-     nothing unusual Psych:  No- change in mood or affect. No depression or anxiety.  No memory loss.   OBJ- Physical Exam General- Alert, Oriented, Affect-appropriate, Distress- none acute. Looks well Skin- rash-none, lesions- none, excoriation- none Lymphadenopathy- none Head- atraumatic            Eyes- Gross vision intact, PERRLA, conjunctivae and secretions clear            Ears- deaf            Nose- Clear, no-Septal dev, mucus, polyps, erosion, perforation             Throat- Mallampati II , mucosa+ red , drainage- none, tonsils- atrophic Neck- flexible , trachea midline, no stridor , thyroid nl, carotid no bruit Chest - symmetrical excursion , unlabored           Heart/CV- RRR , no murmur , no gallop  , no rub, nl s1 s2                           - JVD- none , edema- none, stasis changes- none, varices- none           Lung- clear to P&A, wheeze- none,  rub- none           Chest wall-  Abd- Br/ Gen/ Rectal- Not done, not indicated Extrem- cyanosis- none, clubbing, none, atrophy- none, strength- nl Neuro- grossly intact to observation

## 2014-04-25 NOTE — Patient Instructions (Signed)
Zpak to drug store  Depo 80  Skip allergy shot this week and resume next week on schedule  Please call as needed

## 2014-05-02 ENCOUNTER — Ambulatory Visit (INDEPENDENT_AMBULATORY_CARE_PROVIDER_SITE_OTHER): Payer: Medicare Other

## 2014-05-02 DIAGNOSIS — J309 Allergic rhinitis, unspecified: Secondary | ICD-10-CM | POA: Diagnosis not present

## 2014-05-09 ENCOUNTER — Ambulatory Visit (INDEPENDENT_AMBULATORY_CARE_PROVIDER_SITE_OTHER): Payer: Medicare Other

## 2014-05-09 ENCOUNTER — Telehealth: Payer: Self-pay | Admitting: Internal Medicine

## 2014-05-09 DIAGNOSIS — J309 Allergic rhinitis, unspecified: Secondary | ICD-10-CM

## 2014-05-09 MED ORDER — PREDNISONE 20 MG PO TABS: 20.0000 mg | ORAL_TABLET | Freq: Every day | ORAL | Status: DC

## 2014-05-09 NOTE — Telephone Encounter (Signed)
Called and spoke to pt's mother. Informed her of the recs per CY. Rx sent to preferred pharmacy. Pt verbalized understanding and denied any further questions or concerns at this time.

## 2014-05-09 NOTE — Telephone Encounter (Signed)
Called, spoke with pt's mother -  Pt was seen by Dr. Annamaria Boots on 04/25/14 and was given a depo and zpak.  Mother reports pt finished zpak.  Cough is still present; mother feels cough is worse at times.  Cough is prod with white phelgm.  Taking mucinex DM and a codeine cough syrup with no relief.  Has increased SOB at times with short term relief from albuterol hfa/neb.  Wheezing at times.  Mom very concerned about pt -- wants her seen SOON by Dr. Annamaria Boots.  Please advise. Thank you.  [facility]  Allergies  Allergen Reactions  . Doxycycline Nausea And Vomiting  . Other     Dog, cat, nuts, grass, trees     Current Outpatient Prescriptions on File Prior to Visit  Medication Sig Dispense Refill  . albuterol (PROVENTIL HFA;VENTOLIN HFA) 108 (90 BASE) MCG/ACT inhaler Inhale 2 puffs into the lungs every 6 (six) hours as needed for wheezing or shortness of breath. 1 Inhaler prn  . albuterol (PROVENTIL) (2.5 MG/3ML) 0.083% nebulizer solution Take 3 mLs (2.5 mg total) by nebulization every 6 (six) hours as needed for wheezing or shortness of breath. 75 mL 12  . ALPRAZolam (XANAX) 0.5 MG tablet Take 1 tablet (0.5 mg total) by mouth at bedtime as needed for anxiety. 30 tablet 0  . ARIPiprazole (ABILIFY) 10 MG tablet Take 10 mg by mouth daily.    Marland Kitchen azithromycin (ZITHROMAX) 250 MG tablet 2 today then one daily 6 each 0  . dextromethorphan-guaiFENesin (MUCINEX DM) 30-600 MG per 12 hr tablet Take 1 tablet by mouth 2 (two) times daily as needed (cough, congestion).    Marland Kitchen estradiol (ESTRACE) 1 MG tablet Take 1 mg by mouth daily.      . fluticasone (FLONASE) 50 MCG/ACT nasal spray Place 2 sprays into the nose daily. 16 g prn  . levothyroxine (SYNTHROID, LEVOTHROID) 75 MCG tablet Take 75 mcg by mouth daily.    . Linaclotide (LINZESS) 290 MCG CAPS capsule Take 290 mcg by mouth daily.    Marland Kitchen loperamide (IMODIUM) 2 MG capsule Take 2 mg by mouth as needed for diarrhea or loose stools.    . mometasone-formoterol  (DULERA) 100-5 MCG/ACT AERO Inhale 2 puffs into the lungs 2 (two) times daily. 1 Inhaler prn  . simvastatin (ZOCOR) 20 MG tablet Take 20 mg by mouth at bedtime.       No current facility-administered medications on file prior to visit.

## 2014-05-09 NOTE — Telephone Encounter (Signed)
Ok to work in when we can. Meantime, offer prednisone 20 mg. # 4, 1 daily x 4 days. This WILL raise blood sugar temporarily.

## 2014-05-16 ENCOUNTER — Ambulatory Visit: Payer: Medicare Other

## 2014-05-16 DIAGNOSIS — R7302 Impaired glucose tolerance (oral): Secondary | ICD-10-CM | POA: Diagnosis not present

## 2014-05-16 DIAGNOSIS — E785 Hyperlipidemia, unspecified: Secondary | ICD-10-CM | POA: Diagnosis not present

## 2014-05-16 DIAGNOSIS — E282 Polycystic ovarian syndrome: Secondary | ICD-10-CM | POA: Diagnosis not present

## 2014-05-16 DIAGNOSIS — F209 Schizophrenia, unspecified: Secondary | ICD-10-CM | POA: Diagnosis not present

## 2014-05-16 DIAGNOSIS — F4010 Social phobia, unspecified: Secondary | ICD-10-CM | POA: Diagnosis not present

## 2014-05-16 DIAGNOSIS — Z6833 Body mass index (BMI) 33.0-33.9, adult: Secondary | ICD-10-CM | POA: Diagnosis not present

## 2014-05-16 DIAGNOSIS — D352 Benign neoplasm of pituitary gland: Secondary | ICD-10-CM | POA: Diagnosis not present

## 2014-05-16 DIAGNOSIS — E039 Hypothyroidism, unspecified: Secondary | ICD-10-CM | POA: Diagnosis not present

## 2014-05-17 ENCOUNTER — Ambulatory Visit: Payer: Medicare Other

## 2014-05-18 ENCOUNTER — Encounter: Payer: Self-pay | Admitting: Internal Medicine

## 2014-05-20 ENCOUNTER — Ambulatory Visit (INDEPENDENT_AMBULATORY_CARE_PROVIDER_SITE_OTHER): Payer: Medicare Other

## 2014-05-20 DIAGNOSIS — J309 Allergic rhinitis, unspecified: Secondary | ICD-10-CM | POA: Diagnosis not present

## 2014-05-27 ENCOUNTER — Ambulatory Visit: Payer: Medicare Other

## 2014-05-27 DIAGNOSIS — L0292 Furuncle, unspecified: Secondary | ICD-10-CM | POA: Diagnosis not present

## 2014-05-27 DIAGNOSIS — Z6833 Body mass index (BMI) 33.0-33.9, adult: Secondary | ICD-10-CM | POA: Diagnosis not present

## 2014-06-09 ENCOUNTER — Ambulatory Visit (INDEPENDENT_AMBULATORY_CARE_PROVIDER_SITE_OTHER): Payer: Medicare Other | Admitting: Psychology

## 2014-06-09 DIAGNOSIS — F411 Generalized anxiety disorder: Secondary | ICD-10-CM

## 2014-06-10 ENCOUNTER — Ambulatory Visit: Payer: Medicare Other | Admitting: Internal Medicine

## 2014-06-13 ENCOUNTER — Ambulatory Visit (INDEPENDENT_AMBULATORY_CARE_PROVIDER_SITE_OTHER): Payer: Medicare Other

## 2014-06-13 DIAGNOSIS — J309 Allergic rhinitis, unspecified: Secondary | ICD-10-CM | POA: Diagnosis not present

## 2014-06-20 ENCOUNTER — Ambulatory Visit (INDEPENDENT_AMBULATORY_CARE_PROVIDER_SITE_OTHER): Payer: Medicare Other

## 2014-06-20 DIAGNOSIS — J309 Allergic rhinitis, unspecified: Secondary | ICD-10-CM | POA: Diagnosis not present

## 2014-06-23 ENCOUNTER — Ambulatory Visit (INDEPENDENT_AMBULATORY_CARE_PROVIDER_SITE_OTHER): Payer: Medicare Other | Admitting: Internal Medicine

## 2014-06-23 ENCOUNTER — Encounter: Payer: Self-pay | Admitting: Internal Medicine

## 2014-06-23 VITALS — BP 118/62 | HR 80 | Ht 64.0 in | Wt 204.0 lb

## 2014-06-23 DIAGNOSIS — J3089 Other allergic rhinitis: Secondary | ICD-10-CM

## 2014-06-23 DIAGNOSIS — J4520 Mild intermittent asthma, uncomplicated: Secondary | ICD-10-CM

## 2014-06-23 DIAGNOSIS — G4733 Obstructive sleep apnea (adult) (pediatric): Secondary | ICD-10-CM | POA: Diagnosis not present

## 2014-06-23 NOTE — Patient Instructions (Signed)
For dry nose try an otc nasal saline gel- put a little in your nose any time you want  For the stuffy nose snoring- try Breathe Right nasal strips. They help hold your nose open at night so you can sleep

## 2014-06-23 NOTE — Progress Notes (Signed)
58 yoFnever smoker, self-referred for help with snoring. I had seen her for asthma as a teenager.         Mother here Deaf since meningitis as an infant, but able to hear snoring which wakes her. Wakes frequently. No sleep walking or movement disorder, but gets up to bathroom or to eat repeatedly. Then tired next day and naps. Mother has OSA. Chronic allergic rhinitis and asthma. Has been on allergy shots from Dr Velora Heckler since age 54. Needs rescue inhaler. Bedtime around 9:30 PM but has difficulty initiating and maintaining sleep. Easily disturbed. May try to sleep as late as 10:30 in the morning. Cough and throat tickle blamed on allergic rhinitis with postnasal drip. Takes Nasalcrom. No ENT surgery.  10/11/11- 69 yoF never smoker, deaf,  self-referred for help with snoring. I had seen her for allergic asthma as a teenager.          Mother here. She had been on allergy shots for many years and finally stopped last week. They describe coughing fits. Over-the-counter cough syrups did not help. Eyes itch. Some sneezing. Occasional use of rescue inhaler.  OSA-unattended home sleep study on 08/27/2011 confirmed mild obstructive sleep apnea, AHI of 14.1 per hour with moderate snoring and oxygen desaturation to 81%. She returns now after CPAP autotitration. Good control and compliance. Titrated to a best pressure range of 10.4. She is tolerating initial CPAP pretty well. Today we discussed alternatives and how to work with the home care company ([facility]).  11/22/11- 60 yoF never smoker, deaf,  Followed for OSA/ CPAP. I had seen her for allergic asthma as a teenager. Mother here. Wears CPAP every night-still has cool feelings from pressure in machine; lower pressure to 5.0 as it was feeling like too much. CPAP 11/ [facility]. I think she had turned a humidifier or the ramp, thinking she was changing the pressure. Cough is productive of scant clear sputum with no wheezing noted by mother. Has used rescue inhaler a  few times but has not needed her nebulizer machine.  02/24/12- 37 yoF never smoker, deaf,  Followed for OSA/ CPAP               Mother here- signing translator Pt states she is doing okay with Dulera; new sleep study scheduled for 03-17-12. Her original study was an unattended home study done because she is deaf. Her CPAP had been picked up by DME because of poor compliance and insurance requires update sleep study documenting she needs it. She says she was afraid of it so she would take it off after a couple of hours every night. We're giving a booklet and discussing again the medical issues.  04/21/12- 39 yoF never smoker, deaf,  Followed for OSA/ CPAP    Here w/ boy friend and signing translator FOLLOWS FOR: review sleep study with patient from 03-17-12 NPSG 03/17/12- AHI  2.5/ hr, WNL. Normal oxygenation.  I discussed normal range of respiratory events as being less than or equal to 5 per hour. She says the biggest sleep problem at home is that her parents keep the house much too hot. She does not need CPAP.   06/16/2012  51 yoF never smoker, deaf,  Mild intermittent asthma  Complains of prod cough with white mucus x3weeks  Finished prednisone by CY with no improvement.   Here with signing translator Patient denies any hemoptysis, chest pain, orthopnea, PND, or leg swelling. Patient has not been using any over-the-counter products for cough and congestion. She  says that her mucus is very thick, causing her to cough quite often. Does interfere with her sleep. Previous sleep study neg for OSA .   09/21/12- 37 yoF never smoker, deaf,  Mild intermittent asthma    Signing translator  here ACUTE VISIT: started Friday with symptoms-pollen got in nose; by the afternoon had drainage. cough started Saturday and making her feel worse. Felt worse today and tried Advil for pain and only slight relief. Overall feels bad. She does not feel she has a cold. Has been out of nasal spray.  10/06/12-- 52 yoF never  smoker, deaf,  Mild intermittent asthma    Research officer, trade [employer] and Mother here ACUTE VISIT: still having a cough (alot) since last visit; clear in color; can't tell that the Depo injection form 09-21-12 helped at all. Chest is sore some per patient. Nasal drainage-clear in color. Persistent cough now for 3 weeks. They're anxious because father died of pneumonia. She is coughing clear mucus with no fever. Admits some heartburn and shortness of breath. We discussed reflux.  She and her mother are interested in restarting allergy vaccine. CXR 06/19/12 IMPRESSION: Several vertebral bodies show mild endplate concavity.  Question a degree of osteoporosis. No edema or consolidation.  Original Report Authenticated By: Lowella Grip, M.D.  03/02/13- 12 yoF never smoker, deaf,  Mild intermittent asthma    Signing translator  Here ACUTE VISIT:  Cough w/ brown and yellow mucus x2 days Nasal congestion, chest congestion, no sore throat. Low-grade fever. Had flu vaccine October 7. Using nebulizer. Had 3 days of nausea and vomiting now resolved. Mother is at home, not sick. CXR 10/06/12 IMPRESSION:  No acute abnormalities.  Original Report Authenticated By: Lavonia Dana, M.D.  03/24/13- 56 yoF never smoker, deaf/ lip reading,  Mild intermittent asthma     FOLLOWS FOR: still on vaccine and doing well with it; states she is having slight cough. Allergy vaccine build-up here going well, no problems. Feels "fine" today. Cough is incidental and does not feel sick.   05/10/13- 75 yoF never smoker, deaf/ lip reading,  Mild intermittent asthma   ACUTE VISIT: Pt had someone at her house cleaning over the weekend-had to change the air filter and started to feel tightness in chest. They are scheduled to change and clean air filters/vents today. Deafness translator here. Increased cough and nasal congestion noticed after HVAC filter changes at home yesterday. Asthma flare. Out of cough medicine. Nebulizer medicine expired.  She has a sample Ventolin rescue inhaler.  09/21/13- 13 yoF never smoker, (deaf/ lip reading),  Mild intermittent asthma, allergi rhinitis complicated by DM2, resolved OSA,      Mother and signing interpreter here. FOLLOWS FOR: c/o intermittent nonprod cough.  Has started using flonase and taking honey with warm water.  States that her symptoms have improved greatly. Tolerating allergy injections well.   Little dry cough. Likes Flonase. They ask about updating CXr because of cough.  02/08/2014 Follow up Asthma  Here with interpretor   never smoker, (deaf/ lip reading),  Mild intermittent asthma, allergi rhinitis complicated by DM2, resolved OSA,    Had episode of increased SOB, wheezing  dry cough x1 night on 9/27.  took Mucinex DM and albuterol neb with total resolution of symptoms.  Feels she is back to baseline with no recurrent symptoms.   did miss allergy injection last week, wonders if this caused symptoms. Got allergy vaccine yesterday without trouble  Denies hemoptysis , chest pain, orthopnea, edema or fever.  04/25/14- 66 yoF never smoker, (deaf/ lip reading),  Mild intermittent asthma, allergi rhinitis complicated by DM2, resolved OSA,      Mother and signing interpreter here. ACUTE VISIT: Sore chest from cough-yellow, sinus drainage as well since Saturday. Nose is sore and red; drinking plenty of fluids to keep from getting dried out.  CXR 09/21/13 IMPRESSION: No active disease. Electronically Signed  By: Aletta Edouard M.D.  On: 09/21/2013 16:09   06/23/14- 49 yoF never smoker, (deaf/ lip reading),  Mild intermittent asthma, allergic rhinitis complicated by DM2, resolved OSA,     signing interpreter here. FOLLOWS FOR: Having dry nose with SOB-snoring. Still on vaccine and doing well Continues allergy vaccine at 1:50 GH. Allergy shots helpful. Got home air filter which helps. Indoor heat dry eyes or nose. This is increasing stuffiness and causing her to snore.  ROS-see  HPI Constitutional:   No-   weight loss, night sweats, fevers, chills,  fatigue, lassitude. HEENT:   No-  headaches, difficulty swallowing, tooth/dental problems, sore throat, She is deaf- reads lips and signs.     No- sneezing, itching, ear ache, +nasal congestion, post nasal drip,  CV:  No-   chest pain, orthopnea, PND, swelling in lower extremities, anasarca, dizziness, palpitations Resp:No- productive cough, + nonproductive cough Skin: No-   rash or lesions. GI:  No  heartburn, indigestion, abdominal pain, nausea, vomiting,  GU:  MS:  No-   joint pain or swelling.   Neuro-     nothing unusual Psych:  No- change in mood or affect. No depression or anxiety.  No memory loss.   OBJ- Physical Exam General- Alert, Oriented, Affect-appropriate, Distress- none acute. Looks well Skin- rash-none, lesions- none, excoriation- none Lymphadenopathy- none Head- atraumatic            Eyes- Gross vision intact, PERRLA, conjunctivae and secretions clear            Ears- deaf            Nose- +mild turbinate edema, no-Septal dev, mucus, polyps, erosion, perforation             Throat- Mallampati II , mucosa+ red , drainage- none, tonsils- atrophic Neck- flexible , trachea midline, no stridor , thyroid nl, carotid no bruit Chest - symmetrical excursion , unlabored           Heart/CV- RRR , no murmur , no gallop  , no rub, nl s1 s2                           - JVD- none , edema- none, stasis changes- none, varices- none           Lung- clear to P&A, wheeze- none,  rub- none           Chest wall-  Abd- Br/ Gen/ Rectal- Not done, not indicated Extrem- cyanosis- none, clubbing, none, atrophy- none, strength- nl Neuro- grossly intact to observation

## 2014-06-25 NOTE — Assessment & Plan Note (Signed)
Plan-continue allergy vaccine

## 2014-06-25 NOTE — Assessment & Plan Note (Signed)
Currently well controlled.  

## 2014-06-25 NOTE — Assessment & Plan Note (Signed)
Increased nasal congestion made her wonder if she needed new sleep evaluation but it sounds as if the problem is just dry stuffy nose. Plan-saline gel, nasal strips

## 2014-06-27 ENCOUNTER — Ambulatory Visit: Payer: Medicare Other

## 2014-06-28 ENCOUNTER — Telehealth: Payer: Self-pay | Admitting: Gastroenterology

## 2014-06-28 NOTE — Telephone Encounter (Signed)
Spoke with the mother (patient is deaf). Patient has had soreness of her abdomen and constipation. She report seeing bright red blood with constipated stools. Mother thinks she is taking Linzess, but is certain she has taken OTC for her constipation. Appointment made with Dr Deatra Ina for evaluation.

## 2014-06-29 ENCOUNTER — Ambulatory Visit (INDEPENDENT_AMBULATORY_CARE_PROVIDER_SITE_OTHER): Payer: Medicare Other

## 2014-06-29 DIAGNOSIS — J309 Allergic rhinitis, unspecified: Secondary | ICD-10-CM

## 2014-06-30 ENCOUNTER — Ambulatory Visit (INDEPENDENT_AMBULATORY_CARE_PROVIDER_SITE_OTHER): Payer: Medicare Other | Admitting: Psychology

## 2014-06-30 DIAGNOSIS — F411 Generalized anxiety disorder: Secondary | ICD-10-CM

## 2014-07-05 ENCOUNTER — Encounter: Payer: Self-pay | Admitting: Gastroenterology

## 2014-07-05 ENCOUNTER — Ambulatory Visit (INDEPENDENT_AMBULATORY_CARE_PROVIDER_SITE_OTHER): Payer: Medicare Other | Admitting: Gastroenterology

## 2014-07-05 VITALS — BP 130/78 | HR 90 | Ht 64.0 in | Wt 201.2 lb

## 2014-07-05 DIAGNOSIS — K602 Anal fissure, unspecified: Secondary | ICD-10-CM | POA: Diagnosis not present

## 2014-07-05 DIAGNOSIS — K5900 Constipation, unspecified: Secondary | ICD-10-CM | POA: Diagnosis not present

## 2014-07-05 MED ORDER — LINACLOTIDE 290 MCG PO CAPS: 290.0000 ug | ORAL_CAPSULE | Freq: Every day | ORAL | Status: DC

## 2014-07-05 NOTE — Patient Instructions (Signed)
Use your Linzess three times a week We have sent Nitroglycerin ointment to [facility] (Directions Given)

## 2014-07-05 NOTE — Progress Notes (Signed)
      History of Present Illness:  Heidi Good is complaining again of constipation.  She stopped taking Linzess because it was causing diarrhea.  Last week she had a particular hard bowel movement followed by echo bleeding and pain.  She has the sensation of tearing.    Review of Systems: Pertinent positive and negative review of systems were noted in the above HPI section. All other review of systems were otherwise negative.    Current Medications, Allergies, Past Medical History, Past Surgical History, Family History and Social History were reviewed in [facility] record  Vital signs were reviewed in today's medical record. Physical Exam: General: Well developed , well nourished, no acute distress Abdomen is without masses, tenderness organomegaly Inspection the rectum reveals a posterior midline fissure  See Assessment and Plan under Problem List

## 2014-07-05 NOTE — Assessment & Plan Note (Signed)
Plan starting Linzess 145 g 2-3 times a week.  Daily dose was causing diarrhea.

## 2014-07-05 NOTE — Assessment & Plan Note (Signed)
Plan nitroglycerin ointment 0.125% 3 times a day

## 2014-07-06 ENCOUNTER — Ambulatory Visit: Payer: Medicare Other

## 2014-07-07 ENCOUNTER — Ambulatory Visit (INDEPENDENT_AMBULATORY_CARE_PROVIDER_SITE_OTHER): Payer: Medicare Other

## 2014-07-07 DIAGNOSIS — J309 Allergic rhinitis, unspecified: Secondary | ICD-10-CM | POA: Diagnosis not present

## 2014-07-14 ENCOUNTER — Ambulatory Visit (INDEPENDENT_AMBULATORY_CARE_PROVIDER_SITE_OTHER): Payer: Medicare Other

## 2014-07-14 DIAGNOSIS — J309 Allergic rhinitis, unspecified: Secondary | ICD-10-CM | POA: Diagnosis not present

## 2014-07-21 ENCOUNTER — Ambulatory Visit: Payer: Self-pay

## 2014-07-21 ENCOUNTER — Ambulatory Visit (INDEPENDENT_AMBULATORY_CARE_PROVIDER_SITE_OTHER): Payer: Medicare Other | Admitting: Psychology

## 2014-07-21 DIAGNOSIS — F411 Generalized anxiety disorder: Secondary | ICD-10-CM | POA: Diagnosis not present

## 2014-07-22 ENCOUNTER — Ambulatory Visit (INDEPENDENT_AMBULATORY_CARE_PROVIDER_SITE_OTHER): Payer: Medicare Other

## 2014-07-22 DIAGNOSIS — J309 Allergic rhinitis, unspecified: Secondary | ICD-10-CM | POA: Diagnosis not present

## 2014-07-29 ENCOUNTER — Ambulatory Visit (INDEPENDENT_AMBULATORY_CARE_PROVIDER_SITE_OTHER): Payer: Medicare Other

## 2014-07-29 DIAGNOSIS — J309 Allergic rhinitis, unspecified: Secondary | ICD-10-CM | POA: Diagnosis not present

## 2014-08-04 ENCOUNTER — Ambulatory Visit (INDEPENDENT_AMBULATORY_CARE_PROVIDER_SITE_OTHER): Payer: Medicare Other

## 2014-08-04 DIAGNOSIS — J309 Allergic rhinitis, unspecified: Secondary | ICD-10-CM | POA: Diagnosis not present

## 2014-08-10 ENCOUNTER — Ambulatory Visit (INDEPENDENT_AMBULATORY_CARE_PROVIDER_SITE_OTHER): Payer: Medicare Other | Admitting: Gastroenterology

## 2014-08-10 ENCOUNTER — Encounter: Payer: Self-pay | Admitting: Gastroenterology

## 2014-08-10 VITALS — BP 152/84 | HR 64 | Ht 64.0 in | Wt 204.2 lb

## 2014-08-10 DIAGNOSIS — K602 Anal fissure, unspecified: Secondary | ICD-10-CM | POA: Diagnosis not present

## 2014-08-10 DIAGNOSIS — K5900 Constipation, unspecified: Secondary | ICD-10-CM | POA: Diagnosis not present

## 2014-08-10 NOTE — Progress Notes (Signed)
      History of Present Illness:  Heidi Good is here for follow-up of an anal fissure and constipation.  Symptoms of the former have resolved with nitroglycerin ointment.  On a regimen of Linzess 3 times a week she is moving her bowels regularly.  Altogether she is feeling quite well.    Review of Systems: Pertinent positive and negative review of systems were noted in the above HPI section. All other review of systems were otherwise negative.    Current Medications, Allergies, Past Medical History, Past Surgical History, Family History and Social History were reviewed in [facility] record  Vital signs were reviewed in today's medical record. Physical Exam: General: Well developed , well nourished, no acute distress  See Assessment and Plan under Problem List

## 2014-08-10 NOTE — Patient Instructions (Signed)
Continue medications as prescribed Follow up as needed

## 2014-08-10 NOTE — Assessment & Plan Note (Signed)
Excellent response to nitroglycerin ointment.  Plan to continue for 2 more months.

## 2014-08-10 NOTE — Assessment & Plan Note (Signed)
She has had an excellent response to Linzess 3 times a week.  Plan to continue with the same.

## 2014-08-11 ENCOUNTER — Ambulatory Visit: Payer: Medicare Other

## 2014-08-11 ENCOUNTER — Encounter: Payer: Self-pay | Admitting: Internal Medicine

## 2014-08-15 ENCOUNTER — Ambulatory Visit (INDEPENDENT_AMBULATORY_CARE_PROVIDER_SITE_OTHER): Payer: Medicare Other

## 2014-08-15 DIAGNOSIS — J309 Allergic rhinitis, unspecified: Secondary | ICD-10-CM

## 2014-08-17 DIAGNOSIS — F200 Paranoid schizophrenia: Secondary | ICD-10-CM | POA: Diagnosis not present

## 2014-08-22 ENCOUNTER — Ambulatory Visit (INDEPENDENT_AMBULATORY_CARE_PROVIDER_SITE_OTHER): Payer: Medicare Other

## 2014-08-22 DIAGNOSIS — E785 Hyperlipidemia, unspecified: Secondary | ICD-10-CM | POA: Diagnosis not present

## 2014-08-22 DIAGNOSIS — E039 Hypothyroidism, unspecified: Secondary | ICD-10-CM | POA: Diagnosis not present

## 2014-08-22 DIAGNOSIS — R7302 Impaired glucose tolerance (oral): Secondary | ICD-10-CM | POA: Diagnosis not present

## 2014-08-22 DIAGNOSIS — G629 Polyneuropathy, unspecified: Secondary | ICD-10-CM | POA: Diagnosis not present

## 2014-08-22 DIAGNOSIS — J309 Allergic rhinitis, unspecified: Secondary | ICD-10-CM | POA: Diagnosis not present

## 2014-08-22 DIAGNOSIS — H9190 Unspecified hearing loss, unspecified ear: Secondary | ICD-10-CM | POA: Diagnosis not present

## 2014-08-22 DIAGNOSIS — R030 Elevated blood-pressure reading, without diagnosis of hypertension: Secondary | ICD-10-CM | POA: Diagnosis not present

## 2014-08-22 DIAGNOSIS — Z6834 Body mass index (BMI) 34.0-34.9, adult: Secondary | ICD-10-CM | POA: Diagnosis not present

## 2014-08-29 ENCOUNTER — Ambulatory Visit (INDEPENDENT_AMBULATORY_CARE_PROVIDER_SITE_OTHER): Payer: Medicare Other

## 2014-08-29 DIAGNOSIS — J309 Allergic rhinitis, unspecified: Secondary | ICD-10-CM | POA: Diagnosis not present

## 2014-09-05 ENCOUNTER — Ambulatory Visit (INDEPENDENT_AMBULATORY_CARE_PROVIDER_SITE_OTHER): Payer: Medicare Other

## 2014-09-05 DIAGNOSIS — J309 Allergic rhinitis, unspecified: Secondary | ICD-10-CM | POA: Diagnosis not present

## 2014-09-12 ENCOUNTER — Ambulatory Visit: Payer: Self-pay

## 2014-09-16 ENCOUNTER — Telehealth: Payer: Self-pay | Admitting: Internal Medicine

## 2014-09-16 MED ORDER — PREDNISONE 10 MG PO TABS: ORAL_TABLET | ORAL | Status: DC

## 2014-09-16 MED ORDER — AZITHROMYCIN 250 MG PO TABS: ORAL_TABLET | ORAL | Status: DC

## 2014-09-16 NOTE — Telephone Encounter (Signed)
Called and spoke to pt's mother (pt deaf). Pt c/o wheezing, non prod cough, chest congestion, lack of sleep due to coughing x 1 week. Pt denies f/c/s and CP/tightness. Pt taking robitussin. Pt has pending appt with CY in 12/2014. Pt requesting recs. Will send to doc of day as CY is unavailable.   MW please advise.   Allergies  Allergen Reactions  . Doxycycline Nausea And Vomiting  . Other     Dog, cat, nuts, grass, trees

## 2014-09-16 NOTE — Telephone Encounter (Signed)
Patients mom notified.  rx sent to pharmacy. Nothing further needed.

## 2014-09-16 NOTE — Telephone Encounter (Signed)
zpak Prednisone 10 mg take  4 each am x 2 days,   2 each am x 2 days,  1 each am x 2 days and stop  

## 2014-09-20 ENCOUNTER — Ambulatory Visit (INDEPENDENT_AMBULATORY_CARE_PROVIDER_SITE_OTHER): Payer: Medicare Other

## 2014-09-20 DIAGNOSIS — J309 Allergic rhinitis, unspecified: Secondary | ICD-10-CM

## 2014-09-23 ENCOUNTER — Ambulatory Visit: Payer: Medicare Other | Admitting: Internal Medicine

## 2014-09-27 ENCOUNTER — Ambulatory Visit (INDEPENDENT_AMBULATORY_CARE_PROVIDER_SITE_OTHER): Payer: Medicare Other

## 2014-09-27 DIAGNOSIS — J309 Allergic rhinitis, unspecified: Secondary | ICD-10-CM

## 2014-09-29 ENCOUNTER — Ambulatory Visit (INDEPENDENT_AMBULATORY_CARE_PROVIDER_SITE_OTHER): Payer: Medicare Other | Admitting: Psychology

## 2014-09-29 ENCOUNTER — Telehealth: Payer: Self-pay | Admitting: Internal Medicine

## 2014-09-29 ENCOUNTER — Ambulatory Visit (INDEPENDENT_AMBULATORY_CARE_PROVIDER_SITE_OTHER): Payer: Medicare Other

## 2014-09-29 DIAGNOSIS — F411 Generalized anxiety disorder: Secondary | ICD-10-CM

## 2014-09-29 DIAGNOSIS — J309 Allergic rhinitis, unspecified: Secondary | ICD-10-CM

## 2014-09-29 NOTE — Telephone Encounter (Signed)
Date Mixed: 09/29/2014 Vial: AB Strength: 1:50 Here/Mail/Pick Up: Here Mixed By: Desmond Dike, CMA (AAMA)

## 2014-09-30 DIAGNOSIS — E282 Polycystic ovarian syndrome: Secondary | ICD-10-CM | POA: Diagnosis not present

## 2014-09-30 DIAGNOSIS — R7302 Impaired glucose tolerance (oral): Secondary | ICD-10-CM | POA: Diagnosis not present

## 2014-09-30 DIAGNOSIS — E785 Hyperlipidemia, unspecified: Secondary | ICD-10-CM | POA: Diagnosis not present

## 2014-09-30 DIAGNOSIS — F209 Schizophrenia, unspecified: Secondary | ICD-10-CM | POA: Diagnosis not present

## 2014-09-30 DIAGNOSIS — Z6834 Body mass index (BMI) 34.0-34.9, adult: Secondary | ICD-10-CM | POA: Diagnosis not present

## 2014-09-30 DIAGNOSIS — D352 Benign neoplasm of pituitary gland: Secondary | ICD-10-CM | POA: Diagnosis not present

## 2014-09-30 DIAGNOSIS — G629 Polyneuropathy, unspecified: Secondary | ICD-10-CM | POA: Diagnosis not present

## 2014-09-30 DIAGNOSIS — E039 Hypothyroidism, unspecified: Secondary | ICD-10-CM | POA: Diagnosis not present

## 2014-10-03 DIAGNOSIS — F200 Paranoid schizophrenia: Secondary | ICD-10-CM | POA: Diagnosis not present

## 2014-10-04 ENCOUNTER — Ambulatory Visit (INDEPENDENT_AMBULATORY_CARE_PROVIDER_SITE_OTHER): Payer: Medicare Other

## 2014-10-04 DIAGNOSIS — J309 Allergic rhinitis, unspecified: Secondary | ICD-10-CM | POA: Diagnosis not present

## 2014-10-11 ENCOUNTER — Telehealth: Payer: Self-pay | Admitting: Gastroenterology

## 2014-10-11 ENCOUNTER — Ambulatory Visit (INDEPENDENT_AMBULATORY_CARE_PROVIDER_SITE_OTHER): Payer: Medicare Other

## 2014-10-11 DIAGNOSIS — J309 Allergic rhinitis, unspecified: Secondary | ICD-10-CM | POA: Diagnosis not present

## 2014-10-11 NOTE — Telephone Encounter (Signed)
Started yesterday. Patient is having 3 normal stools a day that are dark in color. She is complaining of her abdomen being sore on the left side. She is afebrile, able to perform her ADL's, and appetite is normal. Appointment scheduled for 10/14/14. Will monitor the schedule for sooner appointment. ER if she acutely worsens. Also suggested contacting her PCP to inquire if they can see her sooner.

## 2014-10-14 ENCOUNTER — Encounter: Payer: Self-pay | Admitting: Physician Assistant

## 2014-10-14 ENCOUNTER — Other Ambulatory Visit (INDEPENDENT_AMBULATORY_CARE_PROVIDER_SITE_OTHER): Payer: Medicare Other

## 2014-10-14 ENCOUNTER — Ambulatory Visit (INDEPENDENT_AMBULATORY_CARE_PROVIDER_SITE_OTHER): Payer: Medicare Other | Admitting: Physician Assistant

## 2014-10-14 VITALS — BP 124/80 | HR 84 | Wt 205.2 lb

## 2014-10-14 DIAGNOSIS — R1031 Right lower quadrant pain: Secondary | ICD-10-CM

## 2014-10-14 DIAGNOSIS — K921 Melena: Secondary | ICD-10-CM

## 2014-10-14 LAB — CBC WITH DIFFERENTIAL/PLATELET
BASOS PCT: 0.3 % (ref 0.0–3.0)
Basophils Absolute: 0 10*3/uL (ref 0.0–0.1)
Eosinophils Absolute: 0.3 10*3/uL (ref 0.0–0.7)
Eosinophils Relative: 2.3 % (ref 0.0–5.0)
HCT: 43.3 % (ref 36.0–46.0)
HEMOGLOBIN: 14.9 g/dL (ref 12.0–15.0)
LYMPHS ABS: 3.8 10*3/uL (ref 0.7–4.0)
Lymphocytes Relative: 30.9 % (ref 12.0–46.0)
MCHC: 34.4 g/dL (ref 30.0–36.0)
MCV: 87.7 fl (ref 78.0–100.0)
MONO ABS: 0.9 10*3/uL (ref 0.1–1.0)
MONOS PCT: 7.5 % (ref 3.0–12.0)
NEUTROS ABS: 7.3 10*3/uL (ref 1.4–7.7)
Neutrophils Relative %: 59 % (ref 43.0–77.0)
Platelets: 226 10*3/uL (ref 150.0–400.0)
RBC: 4.94 Mil/uL (ref 3.87–5.11)
RDW: 13.2 % (ref 11.5–15.5)
WBC: 12.4 10*3/uL — ABNORMAL HIGH (ref 4.0–10.5)

## 2014-10-14 LAB — BASIC METABOLIC PANEL
BUN: 7 mg/dL (ref 6–23)
CHLORIDE: 100 meq/L (ref 96–112)
CO2: 32 meq/L (ref 19–32)
Calcium: 9 mg/dL (ref 8.4–10.5)
Creatinine, Ser: 0.93 mg/dL (ref 0.40–1.20)
GFR: 66.95 mL/min (ref >60.00)
Glucose, Bld: 100 mg/dL — ABNORMAL HIGH (ref 70–99)
Potassium: 3.6 mEq/L (ref 3.5–5.1)
Sodium: 137 mEq/L (ref 135–145)

## 2014-10-14 LAB — SEDIMENTATION RATE: SED RATE: 22 mm/h (ref 0–22)

## 2014-10-14 NOTE — Patient Instructions (Addendum)
Please go to the basement level to have your labs drawn.   You have been scheduled for a CT scan of the abdomen and pelvis at [facility] ([address]---this is in the same building as Press [facility]).   You are scheduled on Tuesday 10-18-2014 at 9:00 am. You should arrive at 8:45 am prior to your appointment time for registration. Please follow the written instructions below on the day of your exam:  WARNING: IF YOU ARE ALLERGIC TO IODINE/X-RAY DYE, PLEASE NOTIFY RADIOLOGY IMMEDIATELY AT [phone]! YOU WILL BE GIVEN A 13 HOUR PREMEDICATION PREP.  1) Do not eat or drink anything after 5:00 am  (4 hours prior to your test) 2) You have been given 2 bottles of oral contrast to drink. The solution may taste better if refrigerated, but do NOT add ice or any other liquid to this solution. Shake well before drinking.    Drink 1 bottle of contrast @  7:00 am  (2 hours prior to your exam)  Drink 1 bottle of contrast @ 8:00 am  (1 hour prior to your exam)  You may take any medications as prescribed with a small amount of water except for the following: Metformin, Glucophage, Glucovance, Avandamet, Riomet, Fortamet, Actoplus Met, Janumet, Glumetza or Metaglip. The above medications must be held the day of the exam AND 48 hours after the exam.  The purpose of you drinking the oral contrast is to aid in the visualization of your intestinal tract. The contrast solution may cause some diarrhea. Before your exam is started, you will be given a small amount of fluid to drink. Depending on your individual set of symptoms, you may also receive an intravenous injection of x-ray contrast/dye. Plan on being at [facility] for 30 minutes or long, depending on the type of exam you are having performed.  If you have any questions regarding your exam or if you need to reschedule, you may call the CT department at [phone] between the hours of 8:00 am and 5:00 pm,  Monday-Friday.  ________________________________________________________________________  Normal BMI (Body Mass Index- based on height and weight) is between 19 and 25. Your BMI today is Body mass index is 35.21 kg/(m^2). Marland Kitchen Please consider follow up  regarding your BMI with your Primary Care Provider.

## 2014-10-14 NOTE — Progress Notes (Signed)
Patient ID: Heidi Good, female   DOB: 04-Jun-1960, 54 y.o.   MRN: [id number]   Subjective:    Patient ID: Heidi Good, female    DOB: 20-Aug-1960, 54 y.o.   MRN: [id number]  HPI Heidi Good is a pleasant 54 year old white female known to Dr. Deatra Ina from previous procedures. He had undergone an EGD in 2011 which was normal and colonoscopy in 2010 with finding of left colon diverticulosis and also had hemorrhoids banded at that time. She does have history of chronic constipation and was last seen in March 2016 after an anal fissure. That healed with nitroglycerin ointment and she is also been using Lynn's S as needed for constipation. Patient comes in today after acute onset about 6 days ago with dark black tarry stools. She relates 1 bowel movement a day which is been fairly normal and said the black stool lasted for about 5 days and then stopped. She also had onset of right lower quadrant discomfort last Saturday which has been persistent. She's not had any associated fever or chills no nausea or vomiting. Her stools appeared normal over the past 2 days. Eyes any regular aspirin or NSAID use and said she had not been taking any Pepto-Bismol. Other medical problems include deafness, schizoaffective disorder, hypothyroidism, IBS, adult-onset diabetes mellitus and sleep apnea.  Review of Systems Pertinent positive and negative review of systems were noted in the above HPI section.  All other review of systems was otherwise negative.  Outpatient Encounter Prescriptions as of 10/14/2014  Medication Sig  . albuterol (PROVENTIL HFA;VENTOLIN HFA) 108 (90 BASE) MCG/ACT inhaler Inhale 2 puffs into the lungs every 6 (six) hours as needed for wheezing or shortness of breath.  Marland Kitchen albuterol (PROVENTIL) (2.5 MG/3ML) 0.083% nebulizer solution Take 3 mLs (2.5 mg total) by nebulization every 6 (six) hours as needed for wheezing or shortness of breath.  . ALPRAZolam (XANAX) 0.5 MG tablet Take 1 tablet (0.5 mg total) by mouth  at bedtime as needed for anxiety.  . ARIPiprazole (ABILIFY) 10 MG tablet Take 10 mg by mouth daily.  Marland Kitchen azithromycin (ZITHROMAX) 250 MG tablet Use as directed  . dextromethorphan-guaiFENesin (MUCINEX DM) 30-600 MG per 12 hr tablet Take 1 tablet by mouth 2 (two) times daily as needed (cough, congestion).  Marland Kitchen estradiol (ESTRACE) 1 MG tablet Take 1 mg by mouth daily.    . fluticasone (FLONASE) 50 MCG/ACT nasal spray Place 2 sprays into the nose daily.  Marland Kitchen levothyroxine (SYNTHROID, LEVOTHROID) 75 MCG tablet Take 75 mcg by mouth daily.  . Linaclotide (LINZESS) 290 MCG CAPS capsule Take 1 capsule (290 mcg total) by mouth daily.  Marland Kitchen loperamide (IMODIUM) 2 MG capsule Take 2 mg by mouth as needed for diarrhea or loose stools.  . mometasone-formoterol (DULERA) 100-5 MCG/ACT AERO Inhale 2 puffs into the lungs 2 (two) times daily.  Marland Kitchen NITRO-BID 2 % ointment Place 1 application rectally. Mon, Wed, and Fri  . predniSONE (DELTASONE) 10 MG tablet Take 4 tabs x 2 days, 2 x 2 days, 1 x 2 days, then STOP  . simvastatin (ZOCOR) 20 MG tablet Take 20 mg by mouth at bedtime.     No facility-administered encounter medications on file as of 10/14/2014.   Allergies  Allergen Reactions  . Doxycycline Nausea And Vomiting  . Other     Dog, cat, nuts, grass, trees   Patient Active Problem List   Diagnosis Date Noted  . Anal fissure 07/05/2014  . Constipation 07/17/2013  . Acute bronchitis 06/16/2012  .  Allergic rhinitis due to allergen 08/28/2011  . Asthma, mild intermittent 08/28/2011  . Obstructive sleep apnea 08/28/2011  . Constipation 09/24/2010  . Gastroparesis 04/25/2010  . INTERNAL HEMORRHOIDS WITHOUT MENTION COMP 09/27/2008  . EXTERNAL HEMORRHOIDS WITHOUT MENTION COMP 08/16/2008  . IBS 08/16/2008  . FECAL OCCULT BLOOD 07/11/2008  . HYPOTHYROIDISM 07/06/2008  . SCHIZOAFFECTIVE DISORDER 07/06/2008  . DEPRESSION 07/06/2008  . DEAFNESS 07/06/2008  . DIABETES MELLITUS, TYPE II, HX OF 07/06/2008   History    Social History  . Marital Status: Divorced    Spouse Name: N/A  . Number of Children: 0  . Years of Education: N/A   Occupational History  . disablied    Social History Main Topics  . Smoking status: Never Smoker   . Smokeless tobacco: Never Used  . Alcohol Use: No  . Drug Use: No  . Sexual Activity: Not on file   Other Topics Concern  . Not on file   Social History Narrative    Ms. Faraci's family history includes Cancer in her mother; Diabetes in her mother; Emphysema in her father and paternal grandfather; Heart attack in her father and maternal grandfather; Heart disease in her father; Liver cancer in her maternal grandmother; Multiple myeloma in her father. There is no history of Colon cancer.      Objective:    Filed Vitals:   10/14/14 1356  BP: 124/80  Pulse: 84    Physical Exam  well-developed white female in no acute distress, accompanied by her mother and in interpreter. Patient is deaf blood pressure 124/80 pulse 84 weight 204, BMI 35. HEENT; nontraumatic normocephalic EOMI PERRLA sclera anicteric, Supple; no JVD, Cardiovascular; regular rate and rhythm with S1-S2 no murmur or gallop, Pulmonary clear bilaterally, Abdomen; soft she is tender in the right lower quadrant there is no guarding or rebound no palpable mass or hepatosplenomegaly bowel sounds are present, Rectal ;exam no external lesion noted she has scant stool in the rectal vault which is heme-negative and brown, Extremities; no clubbing cyanosis or edema skin warm and dry, Psych; mood and affect appropriate       Assessment & Plan:   #1 54 yo female with 5-6 day hx  Of black stool, and RLQ pain- heme negative today but cannot r/o recent melena- R/o right colon or distal small bowel inflammatory process #2 Diverticulosis #3 IBS  #4 Deafness  #5 Schizoaffective disorder  #6 AODM  Plan CBC, BMET  Ct adb/pelvis with contrast- further plans pending findings.   Noreta Kue S Lakisha Peyser  PA-C 10/14/2014   Cc: Kennyth Arnold, FNP

## 2014-10-16 NOTE — Progress Notes (Signed)
Reviewed and agree with management. Robert D. Kaplan, M.D., FACG  

## 2014-10-18 ENCOUNTER — Ambulatory Visit: Payer: Self-pay

## 2014-10-18 ENCOUNTER — Ambulatory Visit (INDEPENDENT_AMBULATORY_CARE_PROVIDER_SITE_OTHER)
Admission: RE | Admit: 2014-10-18 | Discharge: 2014-10-18 | Disposition: A | Discharge status: Home / Self Care | Payer: Medicare Other | Source: Ambulatory Visit | Attending: Physician Assistant | Admitting: Physician Assistant

## 2014-10-18 DIAGNOSIS — Z90710 Acquired absence of both cervix and uterus: Secondary | ICD-10-CM | POA: Diagnosis not present

## 2014-10-18 DIAGNOSIS — K921 Melena: Secondary | ICD-10-CM | POA: Diagnosis not present

## 2014-10-18 DIAGNOSIS — R1031 Right lower quadrant pain: Secondary | ICD-10-CM

## 2014-10-18 MED ORDER — IOHEXOL 300 MG/ML  SOLN
100.0000 mL | Freq: Once | INTRAMUSCULAR | Status: AC | PRN
  Administered 2014-10-18: 100 mL via INTRAVENOUS

## 2014-10-19 ENCOUNTER — Ambulatory Visit (INDEPENDENT_AMBULATORY_CARE_PROVIDER_SITE_OTHER): Payer: Medicare Other

## 2014-10-19 DIAGNOSIS — J309 Allergic rhinitis, unspecified: Secondary | ICD-10-CM

## 2014-10-24 DIAGNOSIS — M7712 Lateral epicondylitis, left elbow: Secondary | ICD-10-CM | POA: Diagnosis not present

## 2014-10-26 ENCOUNTER — Ambulatory Visit (INDEPENDENT_AMBULATORY_CARE_PROVIDER_SITE_OTHER): Payer: Medicare Other

## 2014-10-26 DIAGNOSIS — J309 Allergic rhinitis, unspecified: Secondary | ICD-10-CM | POA: Diagnosis not present

## 2014-10-28 DIAGNOSIS — L919 Hypertrophic disorder of the skin, unspecified: Secondary | ICD-10-CM | POA: Diagnosis not present

## 2014-10-28 DIAGNOSIS — L814 Other melanin hyperpigmentation: Secondary | ICD-10-CM | POA: Diagnosis not present

## 2014-10-28 DIAGNOSIS — L821 Other seborrheic keratosis: Secondary | ICD-10-CM | POA: Diagnosis not present

## 2014-10-28 DIAGNOSIS — D1801 Hemangioma of skin and subcutaneous tissue: Secondary | ICD-10-CM | POA: Diagnosis not present

## 2014-10-28 DIAGNOSIS — D225 Melanocytic nevi of trunk: Secondary | ICD-10-CM | POA: Diagnosis not present

## 2014-10-28 DIAGNOSIS — L570 Actinic keratosis: Secondary | ICD-10-CM | POA: Diagnosis not present

## 2014-11-02 ENCOUNTER — Ambulatory Visit (INDEPENDENT_AMBULATORY_CARE_PROVIDER_SITE_OTHER): Payer: Medicare Other

## 2014-11-02 DIAGNOSIS — J309 Allergic rhinitis, unspecified: Secondary | ICD-10-CM | POA: Diagnosis not present

## 2014-11-09 ENCOUNTER — Ambulatory Visit (INDEPENDENT_AMBULATORY_CARE_PROVIDER_SITE_OTHER): Payer: Medicare Other

## 2014-11-09 DIAGNOSIS — J309 Allergic rhinitis, unspecified: Secondary | ICD-10-CM | POA: Diagnosis not present

## 2014-11-16 ENCOUNTER — Ambulatory Visit (INDEPENDENT_AMBULATORY_CARE_PROVIDER_SITE_OTHER): Payer: Medicare Other

## 2014-11-16 DIAGNOSIS — J309 Allergic rhinitis, unspecified: Secondary | ICD-10-CM | POA: Diagnosis not present

## 2014-11-21 ENCOUNTER — Encounter: Payer: Self-pay | Admitting: Internal Medicine

## 2014-11-23 ENCOUNTER — Ambulatory Visit (INDEPENDENT_AMBULATORY_CARE_PROVIDER_SITE_OTHER): Payer: Medicare Other

## 2014-11-23 DIAGNOSIS — J309 Allergic rhinitis, unspecified: Secondary | ICD-10-CM | POA: Diagnosis not present

## 2014-11-24 DIAGNOSIS — E785 Hyperlipidemia, unspecified: Secondary | ICD-10-CM | POA: Diagnosis not present

## 2014-11-24 DIAGNOSIS — G629 Polyneuropathy, unspecified: Secondary | ICD-10-CM | POA: Diagnosis not present

## 2014-11-24 DIAGNOSIS — H9190 Unspecified hearing loss, unspecified ear: Secondary | ICD-10-CM | POA: Diagnosis not present

## 2014-11-24 DIAGNOSIS — Z6834 Body mass index (BMI) 34.0-34.9, adult: Secondary | ICD-10-CM | POA: Diagnosis not present

## 2014-11-24 DIAGNOSIS — E039 Hypothyroidism, unspecified: Secondary | ICD-10-CM | POA: Diagnosis not present

## 2014-11-24 DIAGNOSIS — M4306 Spondylolysis, lumbar region: Secondary | ICD-10-CM | POA: Diagnosis not present

## 2014-11-24 DIAGNOSIS — F209 Schizophrenia, unspecified: Secondary | ICD-10-CM | POA: Diagnosis not present

## 2014-11-24 DIAGNOSIS — R7302 Impaired glucose tolerance (oral): Secondary | ICD-10-CM | POA: Diagnosis not present

## 2014-11-30 ENCOUNTER — Ambulatory Visit (INDEPENDENT_AMBULATORY_CARE_PROVIDER_SITE_OTHER): Payer: Medicare Other

## 2014-11-30 DIAGNOSIS — J309 Allergic rhinitis, unspecified: Secondary | ICD-10-CM

## 2014-11-30 DIAGNOSIS — M7712 Lateral epicondylitis, left elbow: Secondary | ICD-10-CM | POA: Diagnosis not present

## 2014-12-01 ENCOUNTER — Ambulatory Visit (INDEPENDENT_AMBULATORY_CARE_PROVIDER_SITE_OTHER): Payer: Medicare Other | Admitting: Psychology

## 2014-12-01 DIAGNOSIS — F411 Generalized anxiety disorder: Secondary | ICD-10-CM

## 2014-12-02 DIAGNOSIS — F200 Paranoid schizophrenia: Secondary | ICD-10-CM | POA: Diagnosis not present

## 2014-12-06 ENCOUNTER — Encounter: Payer: Self-pay | Admitting: Gastroenterology

## 2014-12-12 DIAGNOSIS — M7712 Lateral epicondylitis, left elbow: Secondary | ICD-10-CM | POA: Diagnosis not present

## 2014-12-14 ENCOUNTER — Ambulatory Visit (INDEPENDENT_AMBULATORY_CARE_PROVIDER_SITE_OTHER): Payer: Medicare Other

## 2014-12-14 DIAGNOSIS — J309 Allergic rhinitis, unspecified: Secondary | ICD-10-CM | POA: Diagnosis not present

## 2014-12-15 DIAGNOSIS — M7712 Lateral epicondylitis, left elbow: Secondary | ICD-10-CM | POA: Diagnosis not present

## 2014-12-19 DIAGNOSIS — M7712 Lateral epicondylitis, left elbow: Secondary | ICD-10-CM | POA: Diagnosis not present

## 2014-12-21 ENCOUNTER — Ambulatory Visit (INDEPENDENT_AMBULATORY_CARE_PROVIDER_SITE_OTHER): Payer: Medicare Other

## 2014-12-21 DIAGNOSIS — J309 Allergic rhinitis, unspecified: Secondary | ICD-10-CM

## 2014-12-22 ENCOUNTER — Ambulatory Visit (INDEPENDENT_AMBULATORY_CARE_PROVIDER_SITE_OTHER): Payer: Medicare Other | Admitting: Internal Medicine

## 2014-12-22 ENCOUNTER — Encounter: Payer: Self-pay | Admitting: Internal Medicine

## 2014-12-22 VITALS — BP 132/74 | HR 71 | Ht 64.0 in | Wt 205.0 lb

## 2014-12-22 DIAGNOSIS — J301 Allergic rhinitis due to pollen: Secondary | ICD-10-CM

## 2014-12-22 DIAGNOSIS — J4520 Mild intermittent asthma, uncomplicated: Secondary | ICD-10-CM | POA: Diagnosis not present

## 2014-12-22 DIAGNOSIS — M7712 Lateral epicondylitis, left elbow: Secondary | ICD-10-CM | POA: Diagnosis not present

## 2014-12-22 MED ORDER — MOMETASONE FURO-FORMOTEROL FUM 100-5 MCG/ACT IN AERO: INHALATION_SPRAY | RESPIRATORY_TRACT | Status: DC

## 2014-12-22 NOTE — Patient Instructions (Addendum)
Ok to use the rescue inhaler up to every 4-6 hours if needed for wheeze and cough  Script sent to refill the blue and white Dulera maintenance inhaler. Use this  2 puffs, then rinse mouth, twice daily during the seasons when you are having trouble.  Ok to use the otc Flonase/ fluticasone nasal spray during allergy seasons  PLease call us as needed  Allergy vaccine 1:10 GH

## 2014-12-22 NOTE — Progress Notes (Signed)
71 yoFnever smoker, self-referred for help with snoring. I had seen her for asthma as a teenager.         Mother here Deaf since meningitis as an infant, but able to hear snoring which wakes her. Wakes frequently. No sleep walking or movement disorder, but gets up to bathroom or to eat repeatedly. Then tired next day and naps. Mother has OSA. Chronic allergic rhinitis and asthma. Has been on allergy shots from Dr Velora Heckler since age 54. Needs rescue inhaler. Bedtime around 9:30 PM but has difficulty initiating and maintaining sleep. Easily disturbed. May try to sleep as late as 10:30 in the morning. Cough and throat tickle blamed on allergic rhinitis with postnasal drip. Takes Nasalcrom. No ENT surgery.  10/11/11- 59 yoF never smoker, deaf,  self-referred for help with snoring. I had seen her for allergic asthma as a teenager.          Mother here. She had been on allergy shots for many years and finally stopped last week. They describe coughing fits. Over-the-counter cough syrups did not help. Eyes itch. Some sneezing. Occasional use of rescue inhaler.  OSA-unattended home sleep study on 08/27/2011 confirmed mild obstructive sleep apnea, AHI of 14.1 per hour with moderate snoring and oxygen desaturation to 81%. She returns now after CPAP autotitration. Good control and compliance. Titrated to a best pressure range of 10.4. She is tolerating initial CPAP pretty well. Today we discussed alternatives and how to work with the home care company ([facility]).  11/22/11- 34 yoF never smoker, deaf,  Followed for OSA/ CPAP. I had seen her for allergic asthma as a teenager. Mother here. Wears CPAP every night-still has cool feelings from pressure in machine; lower pressure to 5.0 as it was feeling like too much. CPAP 11/ [facility]. I think she had turned a humidifier or the ramp, thinking she was changing the pressure. Cough is productive of scant clear sputum with no wheezing noted by mother. Has used rescue inhaler a  few times but has not needed her nebulizer machine.  02/24/12- 81 yoF never smoker, deaf,  Followed for OSA/ CPAP               Mother here- signing translator Pt states she is doing okay with Dulera; new sleep study scheduled for 03-17-12. Her original study was an unattended home study done because she is deaf. Her CPAP had been picked up by DME because of poor compliance and insurance requires update sleep study documenting she needs it. She says she was afraid of it so she would take it off after a couple of hours every night. We're giving a booklet and discussing again the medical issues.  04/21/12- 54 yoF never smoker, deaf,  Followed for OSA/ CPAP    Here w/ boy friend and signing translator FOLLOWS FOR: review sleep study with patient from 03-17-12 NPSG 03/17/12- AHI  2.5/ hr, WNL. Normal oxygenation.  I discussed normal range of respiratory events as being less than or equal to 5 per hour. She says the biggest sleep problem at home is that her parents keep the house much too hot. She does not need CPAP.   06/16/2012  51 yoF never smoker, deaf,  Mild intermittent asthma  Complains of prod cough with white mucus x3weeks  Finished prednisone by CY with no improvement.   Here with signing translator Patient denies any hemoptysis, chest pain, orthopnea, PND, or leg swelling. Patient has not been using any over-the-counter products for cough and congestion. She  says that her mucus is very thick, causing her to cough quite often. Does interfere with her sleep. Previous sleep study neg for OSA .   09/21/12- 89 yoF never smoker, deaf,  Mild intermittent asthma    Signing translator  here ACUTE VISIT: started Friday with symptoms-pollen got in nose; by the afternoon had drainage. cough started Saturday and making her feel worse. Felt worse today and tried Advil for pain and only slight relief. Overall feels bad. She does not feel she has a cold. Has been out of nasal spray.  10/06/12-- 79 yoF never  smoker, deaf,  Mild intermittent asthma    Research officer, trade [employer] and Mother here ACUTE VISIT: still having a cough (alot) since last visit; clear in color; can't tell that the Depo injection form 09-21-12 helped at all. Chest is sore some per patient. Nasal drainage-clear in color. Persistent cough now for 3 weeks. They're anxious because father died of pneumonia. She is coughing clear mucus with no fever. Admits some heartburn and shortness of breath. We discussed reflux.  She and her mother are interested in restarting allergy vaccine. CXR 06/19/12 IMPRESSION: Several vertebral bodies show mild endplate concavity.  Question a degree of osteoporosis. No edema or consolidation.  Original Report Authenticated By: Lowella Grip, M.D.  03/02/13- 72 yoF never smoker, deaf,  Mild intermittent asthma    Signing translator  Here ACUTE VISIT:  Cough w/ brown and yellow mucus x2 days Nasal congestion, chest congestion, no sore throat. Low-grade fever. Had flu vaccine October 7. Using nebulizer. Had 3 days of nausea and vomiting now resolved. Mother is at home, not sick. CXR 10/06/12 IMPRESSION:  No acute abnormalities.  Original Report Authenticated By: Lavonia Dana, M.D.  03/24/13- 19 yoF never smoker, deaf/ lip reading,  Mild intermittent asthma     FOLLOWS FOR: still on vaccine and doing well with it; states she is having slight cough. Allergy vaccine build-up here going well, no problems. Feels "fine" today. Cough is incidental and does not feel sick.   05/10/13- 78 yoF never smoker, deaf/ lip reading,  Mild intermittent asthma   ACUTE VISIT: Pt had someone at her house cleaning over the weekend-had to change the air filter and started to feel tightness in chest. They are scheduled to change and clean air filters/vents today. Deafness translator here. Increased cough and nasal congestion noticed after HVAC filter changes at home yesterday. Asthma flare. Out of cough medicine. Nebulizer medicine expired.  She has a sample Ventolin rescue inhaler.  09/21/13- 35 yoF never smoker, (deaf/ lip reading),  Mild intermittent asthma, allergi rhinitis complicated by DM2, resolved OSA,      Mother and signing interpreter here. FOLLOWS FOR: c/o intermittent nonprod cough.  Has started using flonase and taking honey with warm water.  States that her symptoms have improved greatly. Tolerating allergy injections well.   Little dry cough. Likes Flonase. They ask about updating CXr because of cough.  02/08/2014 Follow up Asthma  Here with interpretor   never smoker, (deaf/ lip reading),  Mild intermittent asthma, allergi rhinitis complicated by DM2, resolved OSA,    Had episode of increased SOB, wheezing  dry cough x1 night on 9/27.  took Mucinex DM and albuterol neb with total resolution of symptoms.  Feels she is back to baseline with no recurrent symptoms.   did miss allergy injection last week, wonders if this caused symptoms. Got allergy vaccine yesterday without trouble  Denies hemoptysis , chest pain, orthopnea, edema or fever.  04/25/14- 66 yoF never smoker, (deaf/ lip reading),  Mild intermittent asthma, allergi rhinitis complicated by DM2, resolved OSA,      Mother and signing interpreter here. ACUTE VISIT: Sore chest from cough-yellow, sinus drainage as well since Saturday. Nose is sore and red; drinking plenty of fluids to keep from getting dried out.  CXR 09/21/13 IMPRESSION: No active disease. Electronically Signed  By: Aletta Edouard M.D.  On: 09/21/2013 16:09   06/23/14- 35 yoF never smoker, (deaf/ lip reading),  Mild intermittent asthma, allergic rhinitis complicated by DM2, resolved OSA,     signing interpreter here. FOLLOWS FOR: Having dry nose with SOB-snoring. Still on vaccine and doing well Continues allergy vaccine at 1:50 GH. Allergy shots helpful. Got home air filter which helps. Indoor heat dry eyes or nose. This is increasing stuffiness and causing her to snore.  12/22/14- 51 yoF  never smoker, (deaf/ lip reading),  Mild intermittent asthma, allergic rhinitis complicated by DM2, resolved OSA,     signing interpreter and mother here. FOLLOWS FOR: Pt continues allergy vaccine 1:50 and doing well.  Had a difficult spring with wheeze nasal congestion and cough. Not clear if this is all allergy or she had a viral infection during that time as well. Much better now. Feels well today. Reviewed medications.  ROS-see HPI Constitutional:   No-   weight loss, night sweats, fevers, chills,  fatigue, lassitude. HEENT:   No-  headaches, difficulty swallowing, tooth/dental problems, sore throat, She is deaf- reads lips and signs.     No- sneezing, itching, ear ache, +nasal congestion, post nasal drip,  CV:  No-   chest pain, orthopnea, PND, swelling in lower extremities, anasarca, dizziness, palpitations Resp:No- productive cough, + nonproductive cough Skin: No-   rash or lesions. GI:  No  heartburn, indigestion, abdominal pain, nausea, vomiting,  GU:  MS:  No-   joint pain or swelling.   Neuro-     nothing unusual Psych:  No- change in mood or affect. No depression or anxiety.  No memory loss.   OBJ- Physical Exam General- Alert, Oriented, Affect-appropriate, Distress- none acute. Looks well Skin- rash-none, lesions- none, excoriation- none Lymphadenopathy- none Head- atraumatic            Eyes- Gross vision intact, PERRLA, conjunctivae and secretions clear            Ears- deaf            Nose- +mild turbinate edema, no-Septal dev, mucus, polyps, erosion, perforation             Throat- Mallampati II , mucosa-clear , drainage- none, tonsils- atrophic Neck- flexible , trachea midline, no stridor , thyroid nl, carotid no bruit Chest - symmetrical excursion , unlabored           Heart/CV- RRR , no murmur , no gallop  , no rub, nl s1 s2                           - JVD- none , edema- none, stasis changes- none, varices- none           Lung- clear to P&A, wheeze- none,  rub-  none           Chest wall-  Abd- Br/ Gen/ Rectal- Not done, not indicated Extrem- cyanosis- none, clubbing, none, atrophy- none, strength- nl Neuro- grossly intact to observation

## 2014-12-23 NOTE — Assessment & Plan Note (Signed)
She is very clear today. We reviewed medications. I'm not sure she has been using maintenance controller appropriately. Plan-prescription for [facility] to resume as instructed.

## 2014-12-23 NOTE — Assessment & Plan Note (Signed)
There is room to go up on her allergy vaccine to 1:10. We will check with the allergy lab on concentration.

## 2014-12-26 DIAGNOSIS — M7712 Lateral epicondylitis, left elbow: Secondary | ICD-10-CM | POA: Diagnosis not present

## 2014-12-28 ENCOUNTER — Ambulatory Visit (INDEPENDENT_AMBULATORY_CARE_PROVIDER_SITE_OTHER): Payer: Medicare Other

## 2014-12-28 DIAGNOSIS — J309 Allergic rhinitis, unspecified: Secondary | ICD-10-CM | POA: Diagnosis not present

## 2014-12-29 DIAGNOSIS — M7712 Lateral epicondylitis, left elbow: Secondary | ICD-10-CM | POA: Diagnosis not present

## 2014-12-30 ENCOUNTER — Encounter: Payer: Self-pay | Admitting: *Deleted

## 2015-01-02 DIAGNOSIS — M7712 Lateral epicondylitis, left elbow: Secondary | ICD-10-CM | POA: Diagnosis not present

## 2015-01-04 ENCOUNTER — Ambulatory Visit: Payer: Self-pay

## 2015-01-04 ENCOUNTER — Telehealth: Payer: Self-pay | Admitting: Internal Medicine

## 2015-01-04 MED ORDER — PREDNISONE 10 MG PO TABS: ORAL_TABLET | ORAL | Status: DC

## 2015-01-04 NOTE — Telephone Encounter (Signed)
ATC line busy x 1  Called back x 2 and NA, line rings multiple times w/o option to leave msg  This is the only listed number to reach pt

## 2015-01-04 NOTE — Telephone Encounter (Signed)
Offer prednisone 10 mg, # 20, 4 X 2 DAYS, 3 X 2 DAYS, 2 X 2 DAYS, 1 X 2 DAYS  

## 2015-01-04 NOTE — Telephone Encounter (Signed)
Spoke with the pt's mother, Arbie Cookey and notified of recs per CDY  She verbalized understanding  Rx was sent to [facility]

## 2015-01-04 NOTE — Telephone Encounter (Signed)
Spoke with the pt's mother, Arbie Cookey  She states that the pt has asthma attack last night  She is coughing until the point of gagging and vomiting, but unable to produce any sputum  She also c/o increased SOB, wheezing, chest tightness No fever or CP  She is using albuterol nebs and mucinex w/o relief  Pt last seen on 12/22/14    Current Outpatient Prescriptions on File Prior to Visit  Medication Sig Dispense Refill  . albuterol (PROVENTIL HFA;VENTOLIN HFA) 108 (90 BASE) MCG/ACT inhaler Inhale 2 puffs into the lungs every 6 (six) hours as needed for wheezing or shortness of breath. (Patient not taking: Reported on 12/22/2014) 1 Inhaler prn  . albuterol (PROVENTIL) (2.5 MG/3ML) 0.083% nebulizer solution Take 3 mLs (2.5 mg total) by nebulization every 6 (six) hours as needed for wheezing or shortness of breath. 75 mL 12  . ALPRAZolam (XANAX) 0.5 MG tablet Take 1 tablet (0.5 mg total) by mouth at bedtime as needed for anxiety. 30 tablet 0  . ARIPiprazole (ABILIFY) 10 MG tablet Take 15 mg by mouth daily.     Marland Kitchen azithromycin (ZITHROMAX) 250 MG tablet Use as directed 6 tablet 0  . dextromethorphan-guaiFENesin (MUCINEX DM) 30-600 MG per 12 hr tablet Take 1 tablet by mouth 2 (two) times daily as needed (cough, congestion).    Marland Kitchen estradiol (ESTRACE) 1 MG tablet Take 1 mg by mouth daily.      . fluticasone (FLONASE) 50 MCG/ACT nasal spray Place 2 sprays into the nose daily. 16 g prn  . levothyroxine (SYNTHROID, LEVOTHROID) 75 MCG tablet Take 75 mcg by mouth daily.    . Linaclotide (LINZESS) 290 MCG CAPS capsule Take 1 capsule (290 mcg total) by mouth daily. 30 capsule 6  . loperamide (IMODIUM) 2 MG capsule Take 2 mg by mouth as needed for diarrhea or loose stools.    . mometasone-formoterol (DULERA) 100-5 MCG/ACT AERO 2 puffs then rinse mouth, twice daily - maintenance 1 Inhaler prn  . NITRO-BID 2 % ointment Place 1 application rectally. Mon, Wed, and Fri    . NONFORMULARY OR COMPOUNDED ITEM Allergy  Vaccine 1:50 Given at [facility]    . simvastatin (ZOCOR) 20 MG tablet Take 20 mg by mouth at bedtime.       No current facility-administered medications on file prior to visit.   Allergies  Allergen Reactions  . Doxycycline Nausea And Vomiting  . Other     Dog, cat, nuts, grass, trees

## 2015-01-10 DIAGNOSIS — M7712 Lateral epicondylitis, left elbow: Secondary | ICD-10-CM | POA: Diagnosis not present

## 2015-01-11 ENCOUNTER — Ambulatory Visit (INDEPENDENT_AMBULATORY_CARE_PROVIDER_SITE_OTHER): Payer: Medicare Other

## 2015-01-11 DIAGNOSIS — J309 Allergic rhinitis, unspecified: Secondary | ICD-10-CM

## 2015-01-18 ENCOUNTER — Ambulatory Visit (INDEPENDENT_AMBULATORY_CARE_PROVIDER_SITE_OTHER): Payer: Medicare Other

## 2015-01-18 DIAGNOSIS — J309 Allergic rhinitis, unspecified: Secondary | ICD-10-CM | POA: Diagnosis not present

## 2015-01-23 ENCOUNTER — Ambulatory Visit (INDEPENDENT_AMBULATORY_CARE_PROVIDER_SITE_OTHER): Payer: Medicare Other

## 2015-01-23 DIAGNOSIS — J309 Allergic rhinitis, unspecified: Secondary | ICD-10-CM

## 2015-01-24 ENCOUNTER — Other Ambulatory Visit: Payer: Self-pay

## 2015-01-24 ENCOUNTER — Telehealth: Payer: Self-pay | Admitting: Internal Medicine

## 2015-01-24 DIAGNOSIS — Z1231 Encounter for screening mammogram for malignant neoplasm of breast: Secondary | ICD-10-CM

## 2015-01-24 NOTE — Telephone Encounter (Signed)
Date Mixed: 01/23/2015 Vial: AB Strength: 1:50 Here/Mail/Pick Up: Here Mixed By: Desmond Dike, CMA

## 2015-01-25 ENCOUNTER — Ambulatory Visit (INDEPENDENT_AMBULATORY_CARE_PROVIDER_SITE_OTHER): Payer: Medicare Other

## 2015-01-25 DIAGNOSIS — Z23 Encounter for immunization: Secondary | ICD-10-CM

## 2015-01-25 DIAGNOSIS — J309 Allergic rhinitis, unspecified: Secondary | ICD-10-CM

## 2015-01-30 DIAGNOSIS — F200 Paranoid schizophrenia: Secondary | ICD-10-CM | POA: Diagnosis not present

## 2015-02-01 ENCOUNTER — Ambulatory Visit (INDEPENDENT_AMBULATORY_CARE_PROVIDER_SITE_OTHER): Payer: Medicare Other

## 2015-02-01 DIAGNOSIS — J309 Allergic rhinitis, unspecified: Secondary | ICD-10-CM

## 2015-02-02 ENCOUNTER — Ambulatory Visit (INDEPENDENT_AMBULATORY_CARE_PROVIDER_SITE_OTHER): Payer: Medicare Other | Admitting: Psychology

## 2015-02-02 DIAGNOSIS — F411 Generalized anxiety disorder: Secondary | ICD-10-CM | POA: Diagnosis not present

## 2015-02-08 ENCOUNTER — Ambulatory Visit (INDEPENDENT_AMBULATORY_CARE_PROVIDER_SITE_OTHER): Payer: Medicare Other

## 2015-02-08 DIAGNOSIS — J309 Allergic rhinitis, unspecified: Secondary | ICD-10-CM

## 2015-02-15 ENCOUNTER — Ambulatory Visit (INDEPENDENT_AMBULATORY_CARE_PROVIDER_SITE_OTHER): Payer: Medicare Other

## 2015-02-15 DIAGNOSIS — J309 Allergic rhinitis, unspecified: Secondary | ICD-10-CM | POA: Diagnosis not present

## 2015-02-22 ENCOUNTER — Ambulatory Visit: Payer: Self-pay

## 2015-02-24 ENCOUNTER — Ambulatory Visit (INDEPENDENT_AMBULATORY_CARE_PROVIDER_SITE_OTHER): Payer: Medicare Other

## 2015-02-24 DIAGNOSIS — J309 Allergic rhinitis, unspecified: Secondary | ICD-10-CM | POA: Diagnosis not present

## 2015-02-28 ENCOUNTER — Ambulatory Visit
Admission: RE | Admit: 2015-02-28 | Discharge: 2015-02-28 | Disposition: A | Discharge status: Home / Self Care | Payer: Medicare Other | Source: Ambulatory Visit

## 2015-02-28 DIAGNOSIS — Z1231 Encounter for screening mammogram for malignant neoplasm of breast: Secondary | ICD-10-CM | POA: Diagnosis not present

## 2015-03-03 ENCOUNTER — Ambulatory Visit (INDEPENDENT_AMBULATORY_CARE_PROVIDER_SITE_OTHER): Payer: Medicare Other

## 2015-03-03 DIAGNOSIS — J309 Allergic rhinitis, unspecified: Secondary | ICD-10-CM | POA: Diagnosis not present

## 2015-03-08 ENCOUNTER — Encounter: Payer: Self-pay | Admitting: Internal Medicine

## 2015-03-08 ENCOUNTER — Ambulatory Visit (INDEPENDENT_AMBULATORY_CARE_PROVIDER_SITE_OTHER): Payer: Medicare Other | Admitting: Internal Medicine

## 2015-03-08 VITALS — BP 134/84 | HR 80 | Temp 99.0°F | Resp 16 | Ht 63.0 in | Wt 207.0 lb

## 2015-03-08 DIAGNOSIS — E785 Hyperlipidemia, unspecified: Secondary | ICD-10-CM | POA: Diagnosis not present

## 2015-03-08 DIAGNOSIS — R7303 Prediabetes: Secondary | ICD-10-CM | POA: Insufficient documentation

## 2015-03-08 DIAGNOSIS — Z23 Encounter for immunization: Secondary | ICD-10-CM | POA: Diagnosis not present

## 2015-03-08 DIAGNOSIS — E039 Hypothyroidism, unspecified: Secondary | ICD-10-CM

## 2015-03-08 DIAGNOSIS — F259 Schizoaffective disorder, unspecified: Secondary | ICD-10-CM

## 2015-03-08 DIAGNOSIS — J4520 Mild intermittent asthma, uncomplicated: Secondary | ICD-10-CM | POA: Diagnosis not present

## 2015-03-08 NOTE — Assessment & Plan Note (Addendum)
Has never been told that she had a sugar problem Previous A1c in prediabetic range Will check A1c Continue regular walking

## 2015-03-08 NOTE — Progress Notes (Signed)
Pre visit review using our clinic review tool, if applicable. No additional management support is needed unless otherwise documented below in the visit note. 

## 2015-03-08 NOTE — Progress Notes (Signed)
Subjective:    Patient ID: Heidi Good, female    DOB: 1960-10-02, 54 y.o.   MRN: [id number]  HPI She is here to establish with a new pcp. She is here with her mother and an interpreter because she is deaf.   Hypothyroidism:  She is taking her medication daily as prescribed. She states she is due for blood work. She currently feels well.   Hyperlipidemia: She is taking her medication daily. She is compliant with a low fat/cholesterol diet. She is exercising regularly - walks daily. She denies myalgias.   Asthma:   Takes her maintenance daily inhaler only as needed - only uses inhaler with allergies, changes in weather.  She has never been intubated.  She feels her asthma is controlled.  Her allergies are controlled - gets injection weekly.  She denies any cold symptoms, wheeze, cough and sob.     Constipation:  She is seeing dr Deatra Ina upstairs.  She had a colonoscopy which was normal.  He prescribes her linzess, which is controlling her constipation.    Schizoaffective disorder:  Follows with psychiatray and sees therapist at behavioral health.  They prescribe her medications and she feels they are working well.    Prediabetes:  She was never told she has a sugar problem.  She walks daily for exercise.  Medications and allergies reviewed with patient and updated if appropriate.  Patient Active Problem List   Diagnosis Date Noted  . Anal fissure 07/05/2014  . Constipation 07/17/2013  . Allergic rhinitis due to allergen 08/28/2011  . Asthma, mild intermittent 08/28/2011  . Obstructive sleep apnea 08/28/2011  . Gastroparesis 04/25/2010  . INTERNAL HEMORRHOIDS WITHOUT MENTION COMP 09/27/2008  . EXTERNAL HEMORRHOIDS WITHOUT MENTION COMP 08/16/2008  . IBS 08/16/2008  . FECAL OCCULT BLOOD 07/11/2008  . Hypothyroidism 07/06/2008  . SCHIZOAFFECTIVE DISORDER 07/06/2008  . DEPRESSION 07/06/2008  . DEAFNESS 07/06/2008  . Diabetes ([place]) 07/06/2008    Past Medical History    Diagnosis Date  . Constipation   . Gastroparesis   . Hypothyroidism   . Type II or unspecified type diabetes mellitus without mention of complication, not stated as uncontrolled   . Anxiety   . Hyperlipemia   . Depression   . Deafness   . Unspecified constipation 07/17/2013    Past Surgical History  Procedure Laterality Date  . Abdominal hysterectomy      Social History   Social History  . Marital Status: Divorced    Spouse Name: N/A  . Number of Children: 0  . Years of Education: N/A   Occupational History  . disablied    Social History Main Topics  . Smoking status: Never Smoker   . Smokeless tobacco: Never Used  . Alcohol Use: No  . Drug Use: No  . Sexual Activity: Not Asked   Other Topics Concern  . None   Social History Narrative    Review of Systems  Constitutional: Negative for fever and fatigue.  HENT: Negative for congestion, sinus pressure, sneezing and sore throat.   Respiratory: Negative for shortness of breath and wheezing.   Cardiovascular: Negative for chest pain, palpitations and leg swelling.  Gastrointestinal: Positive for abdominal pain (occ cramping).       Occ GERD - once every two weeks  Musculoskeletal: Negative for back pain and arthralgias.  Neurological: Positive for headaches. Negative for light-headedness.       Objective:   Filed Vitals:   03/08/15 1405  BP: 134/84  Pulse: 80  Temp: 99 F (37.2 C)  Resp: 16   Filed Weights   03/08/15 1405  Weight: 207 lb (93.895 kg)   Body mass index is 36.68 kg/(m^2).   Physical Exam  Constitutional: She appears well-developed and well-nourished. No distress.  HENT:  Head: Normocephalic and atraumatic.  Right Ear: External ear normal.  Left Ear: External ear normal.  Mouth/Throat: Oropharynx is clear and moist.  Eyes: Conjunctivae are normal.  Neck: Neck supple. No thyromegaly present.  No carotid bruit  Cardiovascular: Normal rate, regular rhythm and normal heart sounds.    No murmur heard. Pulmonary/Chest: Effort normal and breath sounds normal. No respiratory distress. She has no wheezes.  Abdominal: Soft. She exhibits no distension. There is no tenderness.  Musculoskeletal: She exhibits no edema.  Lymphadenopathy:    She has no cervical adenopathy.  Psychiatric: She has a normal mood and affect. Her behavior is normal.          Assessment & Plan:   Tetanus vaccine today  See Problem List.   Follow up in 6 months

## 2015-03-08 NOTE — Assessment & Plan Note (Signed)
Will check TSH and adjust medication dose if needed

## 2015-03-08 NOTE — Assessment & Plan Note (Signed)
receiving allergy injections Asthma controlled Uses maintenance inhaler and rescue inhalers only as needed Most common triggers include weather changes, allergies

## 2015-03-08 NOTE — Assessment & Plan Note (Signed)
Following with psychiatry and they are managing her medication Currently stable

## 2015-03-08 NOTE — Patient Instructions (Signed)
  We have reviewed your prior records including labs and tests today.  Test(s) ordered today. Your results will be released to [place] (or called to you) after review, usually within 72hours after test completion. If any changes need to be made, you will be notified at that same time.  All other Health Maintenance issues reviewed.   All recommended immunizations and age-appropriate screenings are up-to-date.  Tetanus vaccine administered today.   Medications reviewed and updated.  No changes recommended at this time.   Please schedule followup in 6 months

## 2015-03-08 NOTE — Assessment & Plan Note (Signed)
Taking simvastatin 20 mg daily Exercising regularly Low-fat/cholesterol diet advised We'll check lipid panel, CMP

## 2015-03-09 ENCOUNTER — Ambulatory Visit (INDEPENDENT_AMBULATORY_CARE_PROVIDER_SITE_OTHER): Payer: Medicare Other | Admitting: Psychology

## 2015-03-09 DIAGNOSIS — F411 Generalized anxiety disorder: Secondary | ICD-10-CM | POA: Diagnosis not present

## 2015-03-10 ENCOUNTER — Ambulatory Visit (INDEPENDENT_AMBULATORY_CARE_PROVIDER_SITE_OTHER): Payer: Medicare Other

## 2015-03-10 ENCOUNTER — Other Ambulatory Visit (INDEPENDENT_AMBULATORY_CARE_PROVIDER_SITE_OTHER): Payer: Medicare Other

## 2015-03-10 DIAGNOSIS — J309 Allergic rhinitis, unspecified: Secondary | ICD-10-CM

## 2015-03-10 DIAGNOSIS — R7303 Prediabetes: Secondary | ICD-10-CM | POA: Diagnosis not present

## 2015-03-10 DIAGNOSIS — E039 Hypothyroidism, unspecified: Secondary | ICD-10-CM

## 2015-03-10 DIAGNOSIS — E785 Hyperlipidemia, unspecified: Secondary | ICD-10-CM

## 2015-03-10 LAB — TSH: TSH: 1 u[IU]/mL (ref 0.35–4.50)

## 2015-03-10 LAB — COMPREHENSIVE METABOLIC PANEL
ALBUMIN: 3.7 g/dL (ref 3.5–5.2)
ALK PHOS: 91 U/L (ref 39–117)
ALT: 29 U/L (ref 0–35)
AST: 39 U/L — ABNORMAL HIGH (ref 0–37)
BUN: 6 mg/dL (ref 6–23)
CALCIUM: 9.2 mg/dL (ref 8.4–10.5)
CHLORIDE: 105 meq/L (ref 96–112)
CO2: 26 mEq/L (ref 19–32)
Creatinine, Ser: 0.81 mg/dL (ref 0.40–1.20)
GFR: 78.4 mL/min (ref >60.00)
Glucose, Bld: 131 mg/dL — ABNORMAL HIGH (ref 70–99)
POTASSIUM: 4.1 meq/L (ref 3.5–5.1)
Sodium: 143 mEq/L (ref 135–145)
TOTAL PROTEIN: 7.3 g/dL (ref 6.0–8.3)
Total Bilirubin: 0.7 mg/dL (ref 0.2–1.2)

## 2015-03-10 LAB — LIPID PANEL
CHOLESTEROL: 137 mg/dL (ref 0–200)
HDL: 43.1 mg/dL (ref >39.00)
LDL CALC: 55 mg/dL (ref 0–99)
NonHDL: 93.52
TRIGLYCERIDES: 194 mg/dL — AB (ref 0.0–149.0)
Total CHOL/HDL Ratio: 3
VLDL: 38.8 mg/dL (ref 0.0–40.0)

## 2015-03-10 LAB — CBC WITH DIFFERENTIAL/PLATELET
Basophils Absolute: 0.1 10*3/uL (ref 0.0–0.1)
Basophils Relative: 0.7 % (ref 0.0–3.0)
EOS PCT: 4.2 % (ref 0.0–5.0)
Eosinophils Absolute: 0.5 10*3/uL (ref 0.0–0.7)
HEMATOCRIT: 45.3 % (ref 36.0–46.0)
HEMOGLOBIN: 15.2 g/dL — AB (ref 12.0–15.0)
LYMPHS PCT: 32.9 % (ref 12.0–46.0)
Lymphs Abs: 3.5 10*3/uL (ref 0.7–4.0)
MCHC: 33.6 g/dL (ref 30.0–36.0)
MCV: 89 fl (ref 78.0–100.0)
MONO ABS: 0.7 10*3/uL (ref 0.1–1.0)
MONOS PCT: 6.7 % (ref 3.0–12.0)
Neutro Abs: 5.9 10*3/uL (ref 1.4–7.7)
Neutrophils Relative %: 55.5 % (ref 43.0–77.0)
Platelets: 207 10*3/uL (ref 150.0–400.0)
RBC: 5.09 Mil/uL (ref 3.87–5.11)
RDW: 13.1 % (ref 11.5–15.5)
WBC: 10.7 10*3/uL — AB (ref 4.0–10.5)

## 2015-03-10 LAB — HEMOGLOBIN A1C: HEMOGLOBIN A1C: 6 % (ref 4.6–6.5)

## 2015-03-14 ENCOUNTER — Telehealth: Payer: Self-pay | Admitting: Emergency Medicine

## 2015-03-14 NOTE — Telephone Encounter (Signed)
Spoke with pt's mother to inform about lab results.

## 2015-03-17 ENCOUNTER — Ambulatory Visit (INDEPENDENT_AMBULATORY_CARE_PROVIDER_SITE_OTHER): Payer: Medicare Other

## 2015-03-17 DIAGNOSIS — J309 Allergic rhinitis, unspecified: Secondary | ICD-10-CM

## 2015-03-24 ENCOUNTER — Ambulatory Visit (INDEPENDENT_AMBULATORY_CARE_PROVIDER_SITE_OTHER): Payer: Medicare Other

## 2015-03-24 DIAGNOSIS — J309 Allergic rhinitis, unspecified: Secondary | ICD-10-CM

## 2015-03-27 ENCOUNTER — Ambulatory Visit (INDEPENDENT_AMBULATORY_CARE_PROVIDER_SITE_OTHER): Payer: Medicare Other

## 2015-03-27 DIAGNOSIS — J309 Allergic rhinitis, unspecified: Secondary | ICD-10-CM

## 2015-03-29 ENCOUNTER — Other Ambulatory Visit: Payer: Self-pay | Admitting: *Deleted

## 2015-03-29 MED ORDER — LEVOTHYROXINE SODIUM 75 MCG PO TABS: 75.0000 ug | ORAL_TABLET | Freq: Every day | ORAL | Status: DC

## 2015-03-29 MED ORDER — SIMVASTATIN 20 MG PO TABS: 20.0000 mg | ORAL_TABLET | Freq: Every day | ORAL | Status: DC

## 2015-03-29 MED ORDER — ESTRADIOL 1 MG PO TABS: 1.0000 mg | ORAL_TABLET | Freq: Every day | ORAL | Status: DC

## 2015-03-29 NOTE — Telephone Encounter (Signed)
Left msg on triage stating daughter is needing refills on her thyroid medication. Called mother back verified medications she is needing. Need refills on levothyroxine, simvastatin, and estradiol. Inform mom will send to [facility].../lmb

## 2015-04-03 ENCOUNTER — Ambulatory Visit (INDEPENDENT_AMBULATORY_CARE_PROVIDER_SITE_OTHER): Payer: Medicare Other

## 2015-04-03 DIAGNOSIS — J309 Allergic rhinitis, unspecified: Secondary | ICD-10-CM

## 2015-04-10 ENCOUNTER — Ambulatory Visit (INDEPENDENT_AMBULATORY_CARE_PROVIDER_SITE_OTHER): Payer: Medicare Other

## 2015-04-10 DIAGNOSIS — J309 Allergic rhinitis, unspecified: Secondary | ICD-10-CM

## 2015-04-13 ENCOUNTER — Ambulatory Visit (INDEPENDENT_AMBULATORY_CARE_PROVIDER_SITE_OTHER): Payer: Medicare Other | Admitting: Psychology

## 2015-04-13 DIAGNOSIS — F411 Generalized anxiety disorder: Secondary | ICD-10-CM

## 2015-04-17 ENCOUNTER — Ambulatory Visit (INDEPENDENT_AMBULATORY_CARE_PROVIDER_SITE_OTHER): Payer: Medicare Other

## 2015-04-17 DIAGNOSIS — J309 Allergic rhinitis, unspecified: Secondary | ICD-10-CM | POA: Diagnosis not present

## 2015-04-19 ENCOUNTER — Telehealth: Payer: Self-pay | Admitting: Internal Medicine

## 2015-04-19 MED ORDER — PREDNISONE 20 MG PO TABS: 20.0000 mg | ORAL_TABLET | Freq: Every day | ORAL | Status: DC

## 2015-04-19 NOTE — Telephone Encounter (Signed)
Spoke with pt's mother, aware of recs.  rx called to pharmacy.  Nothing further needed.

## 2015-04-19 NOTE — Telephone Encounter (Signed)
Suggest prednisone 20 mg, # 4,  1 daily x 4 days    See if that is enough to clear this.

## 2015-04-19 NOTE — Telephone Encounter (Signed)
Spoke with Arbie Cookey (pt mother). She reports pt has prod cough (clear phlem), occas wheezing x 2 weeks. no SOB, no f/c/s/n/v, no PND, no nasal cong.  Mother reports pt is not taking anything OTC or for cough. Please advise Dr. Annamaria Boots thanks  Allergies  Allergen Reactions  . Doxycycline Nausea And Vomiting  . Other     Dog, cat, nuts, grass, trees     Current Outpatient Prescriptions on File Prior to Visit  Medication Sig Dispense Refill  . albuterol (PROVENTIL HFA;VENTOLIN HFA) 108 (90 BASE) MCG/ACT inhaler Inhale 2 puffs into the lungs every 6 (six) hours as needed for wheezing or shortness of breath. 1 Inhaler prn  . albuterol (PROVENTIL) (2.5 MG/3ML) 0.083% nebulizer solution Take 3 mLs (2.5 mg total) by nebulization every 6 (six) hours as needed for wheezing or shortness of breath. 75 mL 12  . ALPRAZolam (XANAX) 0.5 MG tablet Take 1 tablet (0.5 mg total) by mouth at bedtime as needed for anxiety. 30 tablet 0  . ARIPiprazole (ABILIFY) 15 MG tablet Take 15 mg by mouth daily.  0  . dextromethorphan-guaiFENesin (MUCINEX DM) 30-600 MG per 12 hr tablet Take 1 tablet by mouth 2 (two) times daily as needed (cough, congestion).    Marland Kitchen estradiol (ESTRACE) 1 MG tablet Take 1 tablet (1 mg total) by mouth daily. 90 tablet 1  . fluticasone (FLONASE) 50 MCG/ACT nasal spray Place 2 sprays into the nose daily. 16 g prn  . levothyroxine (SYNTHROID, LEVOTHROID) 75 MCG tablet Take 1 tablet (75 mcg total) by mouth daily. 90 tablet 1  . Linaclotide (LINZESS) 290 MCG CAPS capsule Take 1 capsule (290 mcg total) by mouth daily. 30 capsule 6  . loperamide (IMODIUM) 2 MG capsule Take 2 mg by mouth as needed for diarrhea or loose stools.    . mometasone-formoterol (DULERA) 100-5 MCG/ACT AERO 2 puffs then rinse mouth, twice daily - maintenance 1 Inhaler prn  . NITRO-BID 2 % ointment Place 1 application rectally. Mon, Wed, and Fri    . NONFORMULARY OR COMPOUNDED ITEM Allergy Vaccine 1:50 Given at [facility]    .  simvastatin (ZOCOR) 20 MG tablet Take 1 tablet (20 mg total) by mouth at bedtime. 90 tablet 1   No current facility-administered medications on file prior to visit.

## 2015-04-19 NOTE — Telephone Encounter (Signed)
LMTCB x 1 

## 2015-04-19 NOTE — Telephone Encounter (Signed)
Patient's mother returned call, she may be reached at [phone].

## 2015-04-24 ENCOUNTER — Ambulatory Visit (INDEPENDENT_AMBULATORY_CARE_PROVIDER_SITE_OTHER): Payer: Medicare Other

## 2015-04-24 DIAGNOSIS — J309 Allergic rhinitis, unspecified: Secondary | ICD-10-CM

## 2015-04-24 DIAGNOSIS — F200 Paranoid schizophrenia: Secondary | ICD-10-CM | POA: Diagnosis not present

## 2015-04-27 ENCOUNTER — Telehealth: Payer: Self-pay | Admitting: Internal Medicine

## 2015-04-27 MED ORDER — PROMETHAZINE-CODEINE 6.25-10 MG/5ML PO SYRP: 5.0000 mL | ORAL_SOLUTION | Freq: Three times a day (TID) | ORAL | Status: DC | PRN

## 2015-04-27 MED ORDER — PREDNISONE 10 MG PO TABS: 10.0000 mg | ORAL_TABLET | Freq: Every day | ORAL | Status: DC

## 2015-04-27 NOTE — Telephone Encounter (Signed)
Suggest prednisone 10 mg, # 7, 1 day  Also prometh codeine  200 ml, 5 ml every 8 hours as needed for cough

## 2015-04-27 NOTE — Telephone Encounter (Signed)
Pt's mother is aware of CY's recommendation. Rx's have been called/sent in to her pharmacy. Nothing further was needed.

## 2015-04-27 NOTE — Telephone Encounter (Signed)
Spoke with pt's mother. States that pt has finished her prednisone prescritption and she's still coughing. Cough is dry. Wheezing is also present. Would like CY's recommendations. CY - please advise.  Allergies  Allergen Reactions  . Doxycycline Nausea And Vomiting  . Other     Dog, cat, nuts, grass, trees   Current Outpatient Prescriptions on File Prior to Visit  Medication Sig Dispense Refill  . albuterol (PROVENTIL HFA;VENTOLIN HFA) 108 (90 BASE) MCG/ACT inhaler Inhale 2 puffs into the lungs every 6 (six) hours as needed for wheezing or shortness of breath. 1 Inhaler prn  . albuterol (PROVENTIL) (2.5 MG/3ML) 0.083% nebulizer solution Take 3 mLs (2.5 mg total) by nebulization every 6 (six) hours as needed for wheezing or shortness of breath. 75 mL 12  . ALPRAZolam (XANAX) 0.5 MG tablet Take 1 tablet (0.5 mg total) by mouth at bedtime as needed for anxiety. 30 tablet 0  . ARIPiprazole (ABILIFY) 15 MG tablet Take 15 mg by mouth daily.  0  . dextromethorphan-guaiFENesin (MUCINEX DM) 30-600 MG per 12 hr tablet Take 1 tablet by mouth 2 (two) times daily as needed (cough, congestion).    Marland Kitchen estradiol (ESTRACE) 1 MG tablet Take 1 tablet (1 mg total) by mouth daily. 90 tablet 1  . fluticasone (FLONASE) 50 MCG/ACT nasal spray Place 2 sprays into the nose daily. 16 g prn  . levothyroxine (SYNTHROID, LEVOTHROID) 75 MCG tablet Take 1 tablet (75 mcg total) by mouth daily. 90 tablet 1  . Linaclotide (LINZESS) 290 MCG CAPS capsule Take 1 capsule (290 mcg total) by mouth daily. 30 capsule 6  . loperamide (IMODIUM) 2 MG capsule Take 2 mg by mouth as needed for diarrhea or loose stools.    . mometasone-formoterol (DULERA) 100-5 MCG/ACT AERO 2 puffs then rinse mouth, twice daily - maintenance 1 Inhaler prn  . NITRO-BID 2 % ointment Place 1 application rectally. Mon, Wed, and Fri    . NONFORMULARY OR COMPOUNDED ITEM Allergy Vaccine 1:50 Given at [facility]    . predniSONE (DELTASONE) 20 MG tablet  Take 1 tablet (20 mg total) by mouth daily with breakfast. 4 tablet 0  . simvastatin (ZOCOR) 20 MG tablet Take 1 tablet (20 mg total) by mouth at bedtime. 90 tablet 1   No current facility-administered medications on file prior to visit.

## 2015-05-01 ENCOUNTER — Ambulatory Visit (INDEPENDENT_AMBULATORY_CARE_PROVIDER_SITE_OTHER): Payer: Medicare Other

## 2015-05-01 DIAGNOSIS — J309 Allergic rhinitis, unspecified: Secondary | ICD-10-CM | POA: Diagnosis not present

## 2015-05-09 ENCOUNTER — Ambulatory Visit (INDEPENDENT_AMBULATORY_CARE_PROVIDER_SITE_OTHER): Payer: Medicare Other

## 2015-05-09 DIAGNOSIS — J309 Allergic rhinitis, unspecified: Secondary | ICD-10-CM

## 2015-05-12 ENCOUNTER — Telehealth: Payer: Self-pay | Admitting: Internal Medicine

## 2015-05-12 ENCOUNTER — Ambulatory Visit (INDEPENDENT_AMBULATORY_CARE_PROVIDER_SITE_OTHER): Payer: Medicare Other

## 2015-05-12 DIAGNOSIS — J309 Allergic rhinitis, unspecified: Secondary | ICD-10-CM

## 2015-05-12 NOTE — Telephone Encounter (Addendum)
In your 12/22/14 notes you mentioned: There is room to go up on her all. Vac.to 1:10, check with all. Lab on concentration. In Pt. Instructions you had 1:10 GH. If so please write me a rx when you get a chance. Pt. Is current on 1:50 at 0.5.

## 2015-05-12 NOTE — Telephone Encounter (Signed)
done

## 2015-05-12 NOTE — Telephone Encounter (Signed)
Allergy Serum Extract Date Mixed: 05/12/15 Vial: 2 Strength: 1:10 Here/Mail/Pick Up: here Mixed By: tbs Last OV: 12/22/14 Pending OV: 06/26/15

## 2015-05-16 ENCOUNTER — Ambulatory Visit (INDEPENDENT_AMBULATORY_CARE_PROVIDER_SITE_OTHER): Payer: Medicare Other

## 2015-05-16 DIAGNOSIS — J309 Allergic rhinitis, unspecified: Secondary | ICD-10-CM

## 2015-05-23 ENCOUNTER — Ambulatory Visit: Payer: Self-pay

## 2015-05-25 ENCOUNTER — Ambulatory Visit: Payer: Medicare Other | Admitting: Psychology

## 2015-05-30 ENCOUNTER — Ambulatory Visit (INDEPENDENT_AMBULATORY_CARE_PROVIDER_SITE_OTHER): Payer: Medicare Other

## 2015-05-30 DIAGNOSIS — J309 Allergic rhinitis, unspecified: Secondary | ICD-10-CM | POA: Diagnosis not present

## 2015-05-31 ENCOUNTER — Ambulatory Visit (INDEPENDENT_AMBULATORY_CARE_PROVIDER_SITE_OTHER): Payer: Medicare Other | Admitting: Internal Medicine

## 2015-05-31 ENCOUNTER — Encounter: Payer: Self-pay | Admitting: Internal Medicine

## 2015-05-31 VITALS — BP 130/74 | HR 82 | Wt 206.0 lb

## 2015-05-31 DIAGNOSIS — M25551 Pain in right hip: Secondary | ICD-10-CM

## 2015-05-31 DIAGNOSIS — R7303 Prediabetes: Secondary | ICD-10-CM

## 2015-05-31 DIAGNOSIS — E038 Other specified hypothyroidism: Secondary | ICD-10-CM | POA: Diagnosis not present

## 2015-05-31 DIAGNOSIS — E034 Atrophy of thyroid (acquired): Secondary | ICD-10-CM

## 2015-05-31 DIAGNOSIS — M25552 Pain in left hip: Secondary | ICD-10-CM

## 2015-05-31 MED ORDER — MELOXICAM 15 MG PO TABS: 15.0000 mg | ORAL_TABLET | Freq: Every day | ORAL | Status: DC | PRN

## 2015-05-31 MED ORDER — VITAMIN D3 50 MCG (2000 UT) PO CAPS
2000.0000 [IU] | ORAL_CAPSULE | Freq: Every day | ORAL | Status: DC
Start: 2015-05-31 — End: 2016-03-06

## 2015-05-31 MED ORDER — TRAMADOL HCL 50 MG PO TABS: 50.0000 mg | ORAL_TABLET | Freq: Two times a day (BID) | ORAL | Status: DC | PRN

## 2015-05-31 NOTE — Progress Notes (Signed)
Pre visit review using our clinic review tool, if applicable. No additional management support is needed unless otherwise documented below in the visit note. 

## 2015-05-31 NOTE — Patient Instructions (Addendum)
Ice or heat to hips Hold simvastatin x 1 month  Piriformis Syndrome With Rehab Piriformis syndrome is a condition the affects the nervous system in the area of the hip, and is characterized by pain and possibly a loss of feeling in the backside (posterior) thigh that may extend down the entire length of the leg. The symptoms are caused by an increase in pressure on the sciatic nerve by the piriformis muscle, which is on the back of the hip and is responsible for externally rotating the hip. The sciatic nerve and its branches connect to much of the leg. Normally the sciatic nerve runs between the piriformis muscle and other muscles. However, in certain individuals the nerve runs through the muscle, which causes an increase in pressure on the nerve and results in the symptoms of piriformis syndrome. SYMPTOMS   Pain, tingling, numbness, or burning in the back of the thigh that may also extend down the entire leg.  Occasionally, tenderness in the buttock.  Loss of function of the leg.  Pain that worsens when using the piriformis muscle (running, jumping, or stairs).  Pain that increases with prolonged sitting.  Pain that is lessened by lying flat on the back. CAUSES   Piriformis syndrome is the result of an increase in pressure placed on the sciatic nerve. Oftentimes, piriformis syndrome is an overuse injury.  Stress placed on the nerve from a sudden increase in the intensity, frequency, or duration of training.  Compensation of other extremity injuries. RISK INCREASES WITH:  Sports that involve the piriformis muscle (running, walking, or jumping).  You are born with (congenital) a defect in which the sciatic nerve passes through the muscle. PREVENTION  Warm up and stretch properly before activity.  Allow for adequate recovery between workouts.  Maintain physical fitness:  Strength, flexibility, and endurance.  Cardiovascular fitness. PROGNOSIS  If treated properly, the symptoms  of piriformis syndrome usually resolve in 2 to 6 weeks. RELATED COMPLICATIONS   Persistent and possibly permanent pain and numbness in the lower extremity.  Weakness of the extremity that may progress to disability and inability to compete. TREATMENT  The most effective treatment for piriformis syndrome is rest from any activities that aggravate the symptoms. Ice and pain medication may help reduce pain and inflammation. The use of strengthening and stretching exercises may help reduce pain with activity. These exercises may be performed at home or with a therapist. A referral to a therapist may be given for further evaluation and treatment, such as ultrasound. Corticosteroid injections may be given to reduce inflammation that is causing pressure to be placed on the sciatic nerve. If nonsurgical (conservative) treatment is unsuccessful, then surgery may be recommended.  MEDICATION   If pain medication is necessary, then nonsteroidal anti-inflammatory medications, such as aspirin and ibuprofen, or other minor pain relievers, such as acetaminophen, are often recommended.  Do not take pain medication for 7 days before surgery.  Prescription pain relievers may be given if deemed necessary by your caregiver. Use only as directed and only as much as you need.  Corticosteroid injections may be given by your caregiver. These injections should be reserved for the most serious cases, because they may only be given a certain number of times. HEAT AND COLD:   Cold treatment (icing) relieves pain and reduces inflammation. Cold treatment should be applied for 10 to 15 minutes every 2 to 3 hours for inflammation and pain and immediately after any activity that aggravates your symptoms. Use ice packs or massage  the area with a piece of ice (ice massage).  Heat treatment may be used prior to performing the stretching and strengthening activities prescribed by your caregiver, physical therapist, or athletic  trainer. Use a heat pack or soak the injury in warm water. SEEK IMMEDIATE MEDICAL CARE IF:  Treatment seems to offer no benefit, or the condition worsens.  Any medications produce adverse side effects. EXERCISES RANGE OF MOTION (ROM) AND STRETCHING EXERCISES - Piriformis Syndrome These exercises may help you when beginning to rehabilitate your injury. Your symptoms may resolve with or without further involvement from your physician, physical therapist, or athletic trainer. While completing these exercises, remember:   Restoring tissue flexibility helps normal motion to return to the joints. This allows healthier, less painful movement and activity.  An effective stretch should be held for at least 30 seconds.  A stretch should never be painful. You should only feel a gentle lengthening or release in the stretched tissue. STRETCH - Hip Rotators  Lie on your back on a firm surface. Grasp your right / left knee with your right / left hand and your ankle with your opposite hand.  Keeping your hips and shoulders firmly planted, gently pull your right / left knee and rotate your lower leg toward your opposite shoulder until you feel a stretch in your buttocks.  Hold this stretch for __________ seconds. Repeat this stretch __________ times. Complete this stretch __________ times per day. STRETCH - Iliotibial Band  On the floor or bed, lie on your side so your right / left leg is on top. Bend your knee and grab your ankle.  Slowly bring your knee back so that your thigh is in line with your trunk. Keep your heel at your buttocks and gently arch your back so your head, shoulders, and hips line up.  Slowly lower your leg so that your knee approaches the floor/bed until you feel a gentle stretch on the outside of your right / left thigh. If you do not feel a stretch and your knee will not fall farther, place the heel of your opposite foot on top of your knee and pull your thigh down  farther.  Hold this stretch for __________ seconds. Repeat __________ times. Complete __________ times per day. STRENGTHENING EXERCISES - Piriformis Syndrome  These are some of the caregiver again or until your symptoms are resolved. Remember:   Strong muscles with good endurance tolerate stress better.  Do the exercises as initially prescribed by your caregiver. Progress slowly with each exercise, gradually increasing the number of repetitions and weight used under their guidance. STRENGTH - Hip Abductors, Straight Leg Raises Be aware of your form throughout the entire exercise so that you exercise the correct muscles. Sloppy form means that you are not strengthening the correct muscles.  Lie on your side so that your head, shoulders, knee, and hip line up. You may bend your lower knee to help maintain your balance. Your right / left leg should be on top.  Roll your hips slightly forward, so that your hips are stacked directly over each other and your right / left knee is facing forward.  Lift your top leg up 4-6 inches, leading with your heel. Be sure that your foot does not drift forward or that your knee does not roll toward the ceiling.  Hold this position for __________ seconds. You should feel the muscles in your outer hip lifting (you may not notice this until your leg begins to tire).  Slowly lower  your leg to the starting position. Allow the muscles to fully relax before beginning the next repetition. Repeat __________ times. Complete this exercise __________ times per day.  STRENGTH - Hip Abductors, Quadruped  On a firm, lightly padded surface, position yourself on your hands and knees. Your hands should be directly below your shoulders and your knees should be directly below your hips.  Keeping your right / left knee bent, lift your leg out to the side. Keep your legs level and in line with your shoulders.  Position yourself on your hands and knees.  Hold for __________  seconds.  Keeping your trunk steady and your hips level, slowly lower your leg to the starting position. Repeat __________ times. Complete this exercise __________ times per day.  STRENGTH - Hip Abductors, Standing  Tie one end of a rubber exercise band/tubing to a secure surface (table, pole) and tie a loop at the other end.  Place the loop around your right / left ankle. Keeping your ankle with the band directly opposite of the secured end, step away until there is tension in the tube/band.  Hold onto a chair as needed for balance.  Keeping your back upright, your shoulders over your hips, and your toes pointing forward, lift your right / left leg out to your side. Be sure to lift your leg with your hip muscles. Do not "throw" your leg or tip your body to lift your leg.  Slowly and with control, return to the starting position. Repeat exercise __________ times. Complete this exercise __________ times per day.    This information is not intended to replace advice given to you by your health care provider. Make sure you discuss any questions you have with your health care provider.   Document Released: 04/29/2005 Document Revised: 09/13/2014 Document Reviewed: 08/11/2008 Elsevier Interactive Patient Education 2016 [employer] Hip Bursitis Bursitis is a swelling and soreness (inflammation) of a fluid-filled sac (bursa). This sac overlies and protects the joints.  CAUSES   Injury.  Overuse of the muscles surrounding the joint.  Arthritis.  Gout.  Infection.  Cold weather.  Inadequate warm-up and conditioning prior to activities. The cause may not be known.  SYMPTOMS   Mild to severe irritation.  Tenderness and swelling over the outside of the hip.  Pain with motion of the hip.  If the bursa becomes infected, a fever may be present. Redness, tenderness, and warmth will develop over the hip. Symptoms usually lessen in 3 to 4 weeks with treatment, but can come  back. TREATMENT If conservative treatment does not work, your caregiver may advise draining the bursa and injecting cortisone into the area. This may speed up the healing process. This may also be used as an initial treatment of choice. HOME CARE INSTRUCTIONS   Apply ice to the affected area for 15-20 minutes every 3 to 4 hours while awake for the first 2 days. Put the ice in a plastic bag and place a towel between the bag of ice and your skin.  Rest the painful joint as much as possible, but continue to put the joint through a normal range of motion at least 4 times per day. When the pain lessens, begin normal, slow movements and usual activities to help prevent stiffness of the hip.  Only take over-the-counter or prescription medicines for pain, discomfort, or fever as directed by your caregiver.  Use crutches to limit weight bearing on the hip joint, if advised.  Elevate your painful hip to reduce  swelling. Use pillows for propping and cushioning your legs and hips.  Gentle massage may provide comfort and decrease swelling. SEEK IMMEDIATE MEDICAL CARE IF:   Your pain increases even during treatment, or you are not improving.  You have a fever.  You have heat and inflammation over the involved bursa.  You have any other questions or concerns. MAKE SURE YOU:   Understand these instructions.  Will watch your condition.  Will get help right away if you are not doing well or get worse.   This information is not intended to replace advice given to you by your health care provider. Make sure you discuss any questions you have with your health care provider.   Document Released: 10/19/2001 Document Revised: 07/22/2011 Document Reviewed: 11/29/2014 Elsevier Interactive Patient Education [employer].

## 2015-05-31 NOTE — Progress Notes (Signed)
Subjective:  Patient ID: Heidi Good, female    DOB: Nov 27, 1960  Age: 55 y.o. MRN: [id number]  CC: No chief complaint on file.   HPI Sherron Toto [facility] presents for severe pain in B buttocks and hips in cold weather Kenyonna fell on ice on her back 15 years ago on the steps  Outpatient Prescriptions Prior to Visit  Medication Sig Dispense Refill  . albuterol (PROVENTIL HFA;VENTOLIN HFA) 108 (90 BASE) MCG/ACT inhaler Inhale 2 puffs into the lungs every 6 (six) hours as needed for wheezing or shortness of breath. 1 Inhaler prn  . albuterol (PROVENTIL) (2.5 MG/3ML) 0.083% nebulizer solution Take 3 mLs (2.5 mg total) by nebulization every 6 (six) hours as needed for wheezing or shortness of breath. 75 mL 12  . ALPRAZolam (XANAX) 0.5 MG tablet Take 1 tablet (0.5 mg total) by mouth at bedtime as needed for anxiety. 30 tablet 0  . ARIPiprazole (ABILIFY) 15 MG tablet Take 15 mg by mouth daily.  0  . estradiol (ESTRACE) 1 MG tablet Take 1 tablet (1 mg total) by mouth daily. 90 tablet 1  . fluticasone (FLONASE) 50 MCG/ACT nasal spray Place 2 sprays into the nose daily. 16 g prn  . levothyroxine (SYNTHROID, LEVOTHROID) 75 MCG tablet Take 1 tablet (75 mcg total) by mouth daily. 90 tablet 1  . Linaclotide (LINZESS) 290 MCG CAPS capsule Take 1 capsule (290 mcg total) by mouth daily. 30 capsule 6  . loperamide (IMODIUM) 2 MG capsule Take 2 mg by mouth as needed for diarrhea or loose stools.    . mometasone-formoterol (DULERA) 100-5 MCG/ACT AERO 2 puffs then rinse mouth, twice daily - maintenance 1 Inhaler prn  . NITRO-BID 2 % ointment Place 1 application rectally. Mon, Wed, and Fri    . NONFORMULARY OR COMPOUNDED ITEM Allergy Vaccine 1:50 Given at [facility]    . simvastatin (ZOCOR) 20 MG tablet Take 1 tablet (20 mg total) by mouth at bedtime. 90 tablet 1  . dextromethorphan-guaiFENesin (MUCINEX DM) 30-600 MG per 12 hr tablet Take 1 tablet by mouth 2 (two) times daily as needed (cough, congestion).     . predniSONE (DELTASONE) 10 MG tablet Take 1 tablet (10 mg total) by mouth daily with breakfast. 7 tablet 0  . predniSONE (DELTASONE) 20 MG tablet Take 1 tablet (20 mg total) by mouth daily with breakfast. 4 tablet 0  . promethazine-codeine (PHENERGAN WITH CODEINE) 6.25-10 MG/5ML syrup Take 5 mLs by mouth every 8 (eight) hours as needed for cough. 200 mL 0   No facility-administered medications prior to visit.    ROS Review of Systems  Constitutional: Negative for chills, activity change, appetite change, fatigue and unexpected weight change.  HENT: Negative for congestion, mouth sores and sinus pressure.   Eyes: Negative for visual disturbance.  Respiratory: Negative for cough and chest tightness.   Gastrointestinal: Negative for nausea and abdominal pain.  Genitourinary: Negative for frequency, difficulty urinating and vaginal pain.  Musculoskeletal: Positive for back pain and gait problem. Negative for neck stiffness.  Skin: Negative for pallor and rash.  Neurological: Negative for dizziness, tremors, weakness, numbness and headaches.  Psychiatric/Behavioral: Negative for confusion and sleep disturbance.    Objective:  BP 130/74 mmHg  Pulse 82  Wt 206 lb (93.441 kg)  SpO2 97%  BP Readings from Last 3 Encounters:  05/31/15 130/74  03/08/15 134/84  12/22/14 132/74    Wt Readings from Last 3 Encounters:  05/31/15 206 lb (93.441 kg)  03/08/15 207 lb (  93.895 kg)  12/22/14 205 lb (92.987 kg)    Physical Exam  Constitutional: She appears well-developed. No distress.  HENT:  Head: Normocephalic.  Right Ear: External ear normal.  Left Ear: External ear normal.  Nose: Nose normal.  Mouth/Throat: Oropharynx is clear and moist.  Eyes: Conjunctivae are normal. Pupils are equal, round, and reactive to light. Right eye exhibits no discharge. Left eye exhibits no discharge.  Neck: Normal range of motion. Neck supple. No JVD present. No tracheal deviation present. No thyromegaly  present.  Cardiovascular: Normal rate, regular rhythm and normal heart sounds.   Pulmonary/Chest: No stridor. No respiratory distress. She has no wheezes.  Abdominal: Soft. Bowel sounds are normal. She exhibits no distension and no mass. There is no tenderness. There is no rebound and no guarding.  Musculoskeletal: She exhibits tenderness. She exhibits no edema.  Lymphadenopathy:    She has no cervical adenopathy.  Neurological: She displays normal reflexes. No cranial nerve deficit. She exhibits normal muscle tone. Coordination normal.  Skin: No rash noted. No erythema.  Psychiatric: She has a normal mood and affect. Her behavior is normal. Judgment and thought content normal.   B hips, B glutes are tender  Lab Results  Component Value Date   WBC 10.7* 03/10/2015   HGB 15.2* 03/10/2015   HCT 45.3 03/10/2015   PLT 207.0 03/10/2015   GLUCOSE 131* 03/10/2015   CHOL 137 03/10/2015   TRIG 194.0* 03/10/2015   HDL 43.10 03/10/2015   LDLCALC 55 03/10/2015   ALT 29 03/10/2015   AST 39* 03/10/2015   NA 143 03/10/2015   K 4.1 03/10/2015   CL 105 03/10/2015   CREATININE 0.81 03/10/2015   BUN 6 03/10/2015   CO2 26 03/10/2015   TSH 1.00 03/10/2015   HGBA1C 6.0 03/10/2015    Mm Digital Screening Bilateral  03/01/2015  CLINICAL DATA:  Screening. EXAM: DIGITAL SCREENING BILATERAL MAMMOGRAM WITH CAD COMPARISON:  Previous exam(s). ACR Breast Density Category c: The breast tissue is heterogeneously dense, which may obscure small masses. FINDINGS: There are no findings suspicious for malignancy. Images were processed with CAD. IMPRESSION: No mammographic evidence of malignancy. A result letter of this screening mammogram will be mailed directly to the patient. RECOMMENDATION: Screening mammogram in one year. (Code:SM-B-01Y) BI-RADS CATEGORY  1: Negative. Electronically Signed   By: Margarette Canada M.D.   On: 03/01/2015 16:38    Assessment & Plan:   There are no diagnoses linked to this  encounter. I have discontinued Ms. Gange's dextromethorphan-guaiFENesin, predniSONE, predniSONE, and promethazine-codeine. I am also having her start on meloxicam, traMADol, and Vitamin D3. Additionally, I am having her maintain her fluticasone, albuterol, ALPRAZolam, albuterol, loperamide, Linaclotide, NITRO-BID, mometasone-formoterol, NONFORMULARY OR COMPOUNDED ITEM, ARIPiprazole, simvastatin, levothyroxine, and estradiol.  Meds ordered this encounter  Medications  . meloxicam (MOBIC) 15 MG tablet    Sig: Take 1 tablet (15 mg total) by mouth daily as needed for pain.    Dispense:  30 tablet    Refill:  1  . traMADol (ULTRAM) 50 MG tablet    Sig: Take 1-2 tablets (50-100 mg total) by mouth 2 (two) times daily as needed for severe pain.    Dispense:  100 tablet    Refill:  1  . Cholecalciferol (VITAMIN D3) 2000 units capsule    Sig: Take 1 capsule (2,000 Units total) by mouth daily.    Dispense:  100 capsule    Refill:  3    Procedure Note :  Procedure : Joint Injection,  B  hip   Indication:  Trochanteric bursitis with refractory  chronic pain.   Risks including unsuccessful procedure , bleeding, infection, bruising, skin atrophy, "steroid flare-up" and others were explained to the patient in detail as well as the benefits. Informed consent was obtained and signed.   For each hip:  Tthe patient was placed in a comfortable lateral decubitus position. The point of maximal tenderness was identified. Skin was prepped with Betadine and alcohol. Then, a 5 cc syringe with a 2 inch long 24-gauge needle was used for a bursa injection.. The needle was advanced  Into the bursa. I injected the bursa with 4 mL of 2% lidocaine and 40 mg of Depo-Medrol .  Band-Aid was applied.   Tolerated well. Complications: None. Good pain relief following the procedure.   Postprocedure instructions :    A Band-Aid should be left on for 12 hours. Injection therapy is not a cure itself. It is used in  conjunction with other modalities. You can use nonsteroidal anti-inflammatories like ibuprofen , hot and cold compresses. Rest is recommended in the next 24 hours. You need to report immediately  if fever, chills or any signs of infection develop.    Follow-up: Return in about 6 weeks (around 07/12/2015).  Walker Kehr, MD

## 2015-06-01 ENCOUNTER — Encounter: Payer: Self-pay | Admitting: Internal Medicine

## 2015-06-01 DIAGNOSIS — M25552 Pain in left hip: Principal | ICD-10-CM

## 2015-06-01 DIAGNOSIS — M25551 Pain in right hip: Secondary | ICD-10-CM | POA: Insufficient documentation

## 2015-06-01 MED ORDER — METHYLPREDNISOLONE ACETATE 80 MG/ML IJ SUSP
80.0000 mg | Freq: Once | INTRAMUSCULAR | Status: AC
  Administered 2015-06-02: 80 mg via INTRA_ARTICULAR

## 2015-06-01 NOTE — Assessment & Plan Note (Signed)
Levothroid 

## 2015-06-01 NOTE — Assessment & Plan Note (Signed)
1/17 B piriformis and B trochanteric bursitis See Procedure Meloxicam po Exersises given ramadol prn  Potential benefits of a short or long term opioids use as well as potential risks (i.e. addiction risk, apnea etc) and complications (i.e. Somnolence, constipation and others) were explained to the patient and were aknowledged.

## 2015-06-01 NOTE — Assessment & Plan Note (Signed)
Steroid inj and its effect on CBG discussed

## 2015-06-02 DIAGNOSIS — M25551 Pain in right hip: Secondary | ICD-10-CM | POA: Diagnosis not present

## 2015-06-02 DIAGNOSIS — M25552 Pain in left hip: Secondary | ICD-10-CM | POA: Diagnosis not present

## 2015-06-06 ENCOUNTER — Ambulatory Visit (INDEPENDENT_AMBULATORY_CARE_PROVIDER_SITE_OTHER): Payer: Medicare Other

## 2015-06-06 DIAGNOSIS — J309 Allergic rhinitis, unspecified: Secondary | ICD-10-CM

## 2015-06-13 ENCOUNTER — Ambulatory Visit (INDEPENDENT_AMBULATORY_CARE_PROVIDER_SITE_OTHER): Payer: Medicare Other

## 2015-06-13 DIAGNOSIS — J309 Allergic rhinitis, unspecified: Secondary | ICD-10-CM | POA: Diagnosis not present

## 2015-06-15 ENCOUNTER — Ambulatory Visit (INDEPENDENT_AMBULATORY_CARE_PROVIDER_SITE_OTHER): Payer: Medicare Other | Admitting: Psychology

## 2015-06-15 DIAGNOSIS — F411 Generalized anxiety disorder: Secondary | ICD-10-CM | POA: Diagnosis not present

## 2015-06-22 ENCOUNTER — Ambulatory Visit: Payer: Medicare Other | Admitting: Psychology

## 2015-06-26 ENCOUNTER — Ambulatory Visit (INDEPENDENT_AMBULATORY_CARE_PROVIDER_SITE_OTHER): Payer: Medicare Other | Admitting: Internal Medicine

## 2015-06-26 ENCOUNTER — Encounter: Payer: Self-pay | Admitting: Internal Medicine

## 2015-06-26 VITALS — BP 118/72 | HR 79 | Temp 99.8°F | Ht 64.0 in | Wt 210.2 lb

## 2015-06-26 DIAGNOSIS — J309 Allergic rhinitis, unspecified: Secondary | ICD-10-CM

## 2015-06-26 DIAGNOSIS — J3089 Other allergic rhinitis: Principal | ICD-10-CM

## 2015-06-26 DIAGNOSIS — J302 Other seasonal allergic rhinitis: Secondary | ICD-10-CM

## 2015-06-26 DIAGNOSIS — J4520 Mild intermittent asthma, uncomplicated: Secondary | ICD-10-CM | POA: Diagnosis not present

## 2015-06-26 NOTE — Progress Notes (Signed)
55 yoFnever smoker, self-referred for help with snoring. I had seen her for asthma as a teenager.         Mother here Deaf since meningitis as an infant, but able to hear snoring which wakes her. Wakes frequently. No sleep walking or movement disorder, but gets up to bathroom or to eat repeatedly. Then tired next day and naps. Mother has OSA. Chronic allergic rhinitis and asthma. Has been on allergy shots from Dr Velora Heckler since age 55. Needs rescue inhaler. Bedtime around 9:30 PM but has difficulty initiating and maintaining sleep. Easily disturbed. May try to sleep as late as 10:30 in the morning. Cough and throat tickle blamed on allergic rhinitis with postnasal drip. Takes Nasalcrom. No ENT surgery.  10/11/11- 55 yoF never smoker, deaf,  self-referred for help with snoring. I had seen her for allergic asthma as a teenager.          Mother here. She had been on allergy shots for many years and finally stopped last week. They describe coughing fits. Over-the-counter cough syrups did not help. Eyes itch. Some sneezing. Occasional use of rescue inhaler.  OSA-unattended home sleep study on 08/27/2011 confirmed mild obstructive sleep apnea, AHI of 14.1 per hour with moderate snoring and oxygen desaturation to 81%. She returns now after CPAP autotitration. Good control and compliance. Titrated to a best pressure range of 10.4. She is tolerating initial CPAP pretty well. Today we discussed alternatives and how to work with the home care company ([facility]).  11/22/11- 55 yoF never smoker, deaf,  Followed for OSA/ CPAP. I had seen her for allergic asthma as a teenager. Mother here. Wears CPAP every night-still has cool feelings from pressure in machine; lower pressure to 5.0 as it was feeling like too much. CPAP 11/ [facility]. I think she had turned a humidifier or the ramp, thinking she was changing the pressure. Cough is productive of scant clear sputum with no wheezing noted by mother. Has used rescue inhaler a  few times but has not needed her nebulizer machine.  02/24/12- 55 yoF never smoker, deaf,  Followed for OSA/ CPAP               Mother here- signing translator Pt states she is doing okay with Dulera; new sleep study scheduled for 03-17-12. Her original study was an unattended home study done because she is deaf. Her CPAP had been picked up by DME because of poor compliance and insurance requires update sleep study documenting she needs it. She says she was afraid of it so she would take it off after a couple of hours every night. We're giving a booklet and discussing again the medical issues.  04/21/12- 55 yoF never smoker, deaf,  Followed for OSA/ CPAP    Here w/ boy friend and signing translator FOLLOWS FOR: review sleep study with patient from 03-17-12 NPSG 03/17/12- AHI  2.5/ hr, WNL. Normal oxygenation.  I discussed normal range of respiratory events as being less than or equal to 5 per hour. She says the biggest sleep problem at home is that her parents keep the house much too hot. She does not need CPAP.   06/16/2012  55 yoF never smoker, deaf,  Mild intermittent asthma  Complains of prod cough with white mucus x3weeks  Finished prednisone by CY with no improvement.   Here with signing translator Patient denies any hemoptysis, chest pain, orthopnea, PND, or leg swelling. Patient has not been using any over-the-counter products for cough and congestion. She  says that her mucus is very thick, causing her to cough quite often. Does interfere with her sleep. Previous sleep study neg for OSA .   09/21/12- 55 yoF never smoker, deaf,  Mild intermittent asthma    Signing translator  here ACUTE VISIT: started Friday with symptoms-pollen got in nose; by the afternoon had drainage. cough started Saturday and making her feel worse. Felt worse today and tried Advil for pain and only slight relief. Overall feels bad. She does not feel she has a cold. Has been out of nasal spray.  10/06/12-- 55 yoF never  smoker, deaf,  Mild intermittent asthma    Research officer, trade [employer] and Mother here ACUTE VISIT: still having a cough (alot) since last visit; clear in color; can't tell that the Depo injection form 09-21-12 helped at all. Chest is sore some per patient. Nasal drainage-clear in color. Persistent cough now for 3 weeks. They're anxious because father died of pneumonia. She is coughing clear mucus with no fever. Admits some heartburn and shortness of breath. We discussed reflux.  She and her mother are interested in restarting allergy vaccine. CXR 06/19/12 IMPRESSION: Several vertebral bodies show mild endplate concavity.  Question a degree of osteoporosis. No edema or consolidation.  Original Report Authenticated By: Lowella Grip, M.D.  03/02/13- 55 yoF never smoker, deaf,  Mild intermittent asthma    Signing translator  Here ACUTE VISIT:  Cough w/ brown and yellow mucus x2 days Nasal congestion, chest congestion, no sore throat. Low-grade fever. Had flu vaccine October 7. Using nebulizer. Had 3 days of nausea and vomiting now resolved. Mother is at home, not sick. CXR 10/06/12 IMPRESSION:  No acute abnormalities.  Original Report Authenticated By: Lavonia Dana, M.D.  03/24/13- 55 yoF never smoker, deaf/ lip reading,  Mild intermittent asthma     FOLLOWS FOR: still on vaccine and doing well with it; states she is having slight cough. Allergy vaccine build-up here going well, no problems. Feels "fine" today. Cough is incidental and does not feel sick.   05/10/13- 55 yoF never smoker, deaf/ lip reading,  Mild intermittent asthma   ACUTE VISIT: Pt had someone at her house cleaning over the weekend-had to change the air filter and started to feel tightness in chest. They are scheduled to change and clean air filters/vents today. Deafness translator here. Increased cough and nasal congestion noticed after HVAC filter changes at home yesterday. Asthma flare. Out of cough medicine. Nebulizer medicine expired.  She has a sample Ventolin rescue inhaler.  09/21/13- 55 yoF never smoker, (deaf/ lip reading),  Mild intermittent asthma, allergi rhinitis complicated by DM2, resolved OSA,      Mother and signing interpreter here. FOLLOWS FOR: c/o intermittent nonprod cough.  Has started using flonase and taking honey with warm water.  States that her symptoms have improved greatly. Tolerating allergy injections well.   Little dry cough. Likes Flonase. They ask about updating CXr because of cough.  02/08/2014 Follow up Asthma  Here with interpretor   never smoker, (deaf/ lip reading),  Mild intermittent asthma, allergi rhinitis complicated by DM2, resolved OSA,    Had episode of increased SOB, wheezing  dry cough x1 night on 9/27.  took Mucinex DM and albuterol neb with total resolution of symptoms.  Feels she is back to baseline with no recurrent symptoms.   did miss allergy injection last week, wonders if this caused symptoms. Got allergy vaccine yesterday without trouble  Denies hemoptysis , chest pain, orthopnea, edema or fever.  04/25/14- 25 yoF never smoker, (deaf/ lip reading),  Mild intermittent asthma, allergi rhinitis complicated by DM2, resolved OSA,      Mother and signing interpreter here. ACUTE VISIT: Sore chest from cough-yellow, sinus drainage as well since Saturday. Nose is sore and red; drinking plenty of fluids to keep from getting dried out.  CXR 09/21/13 IMPRESSION: No active disease. Electronically Signed  By: Aletta Edouard M.D.  On: 09/21/2013 16:09   06/23/14- 24 yoF never smoker, (deaf/ lip reading),  Mild intermittent asthma, allergic rhinitis complicated by DM2, resolved OSA,     signing interpreter here. FOLLOWS FOR: Having dry nose with SOB-snoring. Still on vaccine and doing well Continues allergy vaccine at 1:50 GH. Allergy shots helpful. Got home air filter which helps. Indoor heat dry eyes or nose. This is increasing stuffiness and causing her to snore.  12/22/14- 50 yoF  never smoker, (deaf/ lip reading),  Mild intermittent asthma, allergic rhinitis complicated by DM2, resolved OSA,     signing interpreter and mother here. FOLLOWS FOR: Pt continues allergy vaccine 1:50 and doing well.  Had a difficult spring with wheeze nasal congestion and cough. Not clear if this is all allergy or she had a viral infection during that time as well. Much better now. Feels well today. Reviewed medications.  06/26/2015-55 year old female never smoker (deaf/lip reading), mild intermittent asthma, allergic rhinitis complicated by DM 2, resolved OSA Allergy vaccine 1:50 [place] Signing interpreter here 1 day ago onset fever, cough, productive clear sputum, watery rhinorrhea with no GI upset. We are increasing allergy vaccine concentration to 1:10 with next order, in time per spring.  ROS-see HPI Constitutional:   No-   weight loss, night sweats, fevers, chills,  fatigue, lassitude. HEENT:   No-  headaches, difficulty swallowing, tooth/dental problems, sore throat, She is deaf- reads lips and signs.     No- sneezing, itching, ear ache, +nasal congestion, post nasal drip,  CV:  No-   chest pain, orthopnea, PND, swelling in lower extremities, anasarca, dizziness, palpitations Resp:No- productive cough, + nonproductive cough Skin: No-   rash or lesions. GI:  No  heartburn, indigestion, abdominal pain, nausea, vomiting,  GU:  MS:  No-   joint pain or swelling.   Neuro-     nothing unusual Psych:  No- change in mood or affect. No depression or anxiety.  No memory loss.  OBJ- Physical Exam General- Alert, Oriented, Affect-appropriate, Distress- none acute. + Overweight Skin- rash-none, lesions- none, excoriation- none Lymphadenopathy- none Head- atraumatic            Eyes- Gross vision intact, PERRLA, conjunctivae and secretions clear            Ears- + deaf-reads lips and signs            Nose- +mild turbinate edema, no-Septal dev, mucus, polyps, erosion, perforation              Throat- Mallampati II , mucosa-clear , drainage- none, tonsils- atrophic Neck- flexible , trachea midline, no stridor , thyroid nl, carotid no bruit Chest - symmetrical excursion , unlabored           Heart/CV- RRR , no murmur , no gallop  , no rub, nl s1 s2                           - JVD- none , edema- none, stasis changes- none, varices- none  Lung- clear to P&A, wheeze- none,  rub- none, cough + mild           Chest wall-  Abd- Br/ Gen/ Rectal- Not done, not indicated Extrem- cyanosis- none, clubbing, none, atrophy- none, strength- nl Neuro- grossly intact to observation

## 2015-06-26 NOTE — Patient Instructions (Signed)
We will increase allergy vaccine to 1:10  Skip shot this week, then resume on regular schedule   Sorry you have a cold. It is a virus infection and mostly it will have to get better on its own. Stay hydrated with plenty of fluids, avoid getting chilled. Ok to take over the counter cold and flu remedies, cough syrup, throat lozenges etc if they help you.

## 2015-06-27 ENCOUNTER — Ambulatory Visit: Payer: Self-pay

## 2015-06-29 ENCOUNTER — Ambulatory Visit (INDEPENDENT_AMBULATORY_CARE_PROVIDER_SITE_OTHER): Payer: Medicare Other | Admitting: Internal Medicine

## 2015-06-29 ENCOUNTER — Telehealth: Payer: Self-pay | Admitting: Internal Medicine

## 2015-06-29 ENCOUNTER — Encounter: Payer: Self-pay | Admitting: Internal Medicine

## 2015-06-29 VITALS — BP 116/70 | HR 84 | Ht 64.0 in | Wt 209.6 lb

## 2015-06-29 DIAGNOSIS — J4520 Mild intermittent asthma, uncomplicated: Secondary | ICD-10-CM | POA: Diagnosis not present

## 2015-06-29 MED ORDER — LEVALBUTEROL HCL 0.63 MG/3ML IN NEBU
0.6300 mg | INHALATION_SOLUTION | Freq: Once | RESPIRATORY_TRACT | Status: AC
  Administered 2015-06-29: 0.63 mg via RESPIRATORY_TRACT

## 2015-06-29 MED ORDER — METHYLPREDNISOLONE ACETATE 80 MG/ML IJ SUSP
80.0000 mg | Freq: Once | INTRAMUSCULAR | Status: AC
  Administered 2015-06-29: 80 mg via INTRAMUSCULAR

## 2015-06-29 NOTE — Patient Instructions (Signed)
Neb xop 0.63  Depo 67  Ok to keep August appointment unless needed sooner

## 2015-06-29 NOTE — Telephone Encounter (Signed)
[place] for quick visit just for neb and depo 80 for acute bronchitis

## 2015-06-29 NOTE — Telephone Encounter (Signed)
Pt can come in today at 2:30pm slot. Thanks.

## 2015-06-29 NOTE — Assessment & Plan Note (Signed)
Exacerbation asthmatic bronchitis slow to resolve Plan-nebulizer treatments Xopenex, Depo-Medrol

## 2015-06-29 NOTE — Progress Notes (Signed)
55 yoFnever smoker, self-referred for help with snoring. I had seen her for asthma as a teenager.         Mother here Deaf since meningitis as an infant, but able to hear snoring which wakes her. Wakes frequently. No sleep walking or movement disorder, but gets up to bathroom or to eat repeatedly. Then tired next day and naps. Mother has OSA. Chronic allergic rhinitis and asthma. Has been on allergy shots from Dr Velora Heckler since age 55. Needs rescue inhaler. Bedtime around 9:30 PM but has difficulty initiating and maintaining sleep. Easily disturbed. May try to sleep as late as 10:30 in the morning. Cough and throat tickle blamed on allergic rhinitis with postnasal drip. Takes Nasalcrom. No ENT surgery.  10/11/11- 55 yoF never smoker, deaf,  self-referred for help with snoring. I had seen her for allergic asthma as a teenager.          Mother here. She had been on allergy shots for many years and finally stopped last week. They describe coughing fits. Over-the-counter cough syrups did not help. Eyes itch. Some sneezing. Occasional use of rescue inhaler.  OSA-unattended home sleep study on 08/27/2011 confirmed mild obstructive sleep apnea, AHI of 14.1 per hour with moderate snoring and oxygen desaturation to 81%. She returns now after CPAP autotitration. Good control and compliance. Titrated to a best pressure range of 10.4. She is tolerating initial CPAP pretty well. Today we discussed alternatives and how to work with the home care company ([facility]).  11/22/11- 55 yoF never smoker, deaf,  Followed for OSA/ CPAP. I had seen her for allergic asthma as a teenager. Mother here. Wears CPAP every night-still has cool feelings from pressure in machine; lower pressure to 5.0 as it was feeling like too much. CPAP 11/ [facility]. I think she had turned a humidifier or the ramp, thinking she was changing the pressure. Cough is productive of scant clear sputum with no wheezing noted by mother. Has used rescue inhaler a  few times but has not needed her nebulizer machine.  02/24/12- 55 yoF never smoker, deaf,  Followed for OSA/ CPAP               Mother here- signing translator Pt states she is doing okay with Dulera; new sleep study scheduled for 03-17-12. Her original study was an unattended home study done because she is deaf. Her CPAP had been picked up by DME because of poor compliance and insurance requires update sleep study documenting she needs it. She says she was afraid of it so she would take it off after a couple of hours every night. We're giving a booklet and discussing again the medical issues.  04/21/12- 55 yoF never smoker, deaf,  Followed for OSA/ CPAP    Here w/ boy friend and signing translator FOLLOWS FOR: review sleep study with patient from 03-17-12 NPSG 03/17/12- AHI  2.5/ hr, WNL. Normal oxygenation.  I discussed normal range of respiratory events as being less than or equal to 5 per hour. She says the biggest sleep problem at home is that her parents keep the house much too hot. She does not need CPAP.   06/16/2012  55 yoF never smoker, deaf,  Mild intermittent asthma  Complains of prod cough with white mucus x3weeks  Finished prednisone by CY with no improvement.   Here with signing translator Patient denies any hemoptysis, chest pain, orthopnea, PND, or leg swelling. Patient has not been using any over-the-counter products for cough and congestion. She  says that her mucus is very thick, causing her to cough quite often. Does interfere with her sleep. Previous sleep study neg for OSA .   09/21/12- 55 yoF never smoker, deaf,  Mild intermittent asthma    Signing translator  here ACUTE VISIT: started Friday with symptoms-pollen got in nose; by the afternoon had drainage. cough started Saturday and making her feel worse. Felt worse today and tried Advil for pain and only slight relief. Overall feels bad. She does not feel she has a cold. Has been out of nasal spray.  10/06/12-- 55 yoF never  smoker, deaf,  Mild intermittent asthma    Research officer, trade [employer] and Mother here ACUTE VISIT: still having a cough (alot) since last visit; clear in color; can't tell that the Depo injection form 09-21-12 helped at all. Chest is sore some per patient. Nasal drainage-clear in color. Persistent cough now for 3 weeks. They're anxious because father died of pneumonia. She is coughing clear mucus with no fever. Admits some heartburn and shortness of breath. We discussed reflux.  She and her mother are interested in restarting allergy vaccine. CXR 06/19/12 IMPRESSION: Several vertebral bodies show mild endplate concavity.  Question a degree of osteoporosis. No edema or consolidation.  Original Report Authenticated By: Lowella Grip, M.D.  03/02/13- 55 yoF never smoker, deaf,  Mild intermittent asthma    Signing translator  Here ACUTE VISIT:  Cough w/ brown and yellow mucus x2 days Nasal congestion, chest congestion, no sore throat. Low-grade fever. Had flu vaccine October 7. Using nebulizer. Had 3 days of nausea and vomiting now resolved. Mother is at home, not sick. CXR 10/06/12 IMPRESSION:  No acute abnormalities.  Original Report Authenticated By: Lavonia Dana, M.D.  03/24/13- 55 yoF never smoker, deaf/ lip reading,  Mild intermittent asthma     FOLLOWS FOR: still on vaccine and doing well with it; states she is having slight cough. Allergy vaccine build-up here going well, no problems. Feels "fine" today. Cough is incidental and does not feel sick.   05/10/13- 55 yoF never smoker, deaf/ lip reading,  Mild intermittent asthma   ACUTE VISIT: Pt had someone at her house cleaning over the weekend-had to change the air filter and started to feel tightness in chest. They are scheduled to change and clean air filters/vents today. Deafness translator here. Increased cough and nasal congestion noticed after HVAC filter changes at home yesterday. Asthma flare. Out of cough medicine. Nebulizer medicine expired.  She has a sample Ventolin rescue inhaler.  09/21/13- 55 yoF never smoker, (deaf/ lip reading),  Mild intermittent asthma, allergi rhinitis complicated by DM2, resolved OSA,      Mother and signing interpreter here. FOLLOWS FOR: c/o intermittent nonprod cough.  Has started using flonase and taking honey with warm water.  States that her symptoms have improved greatly. Tolerating allergy injections well.   Little dry cough. Likes Flonase. They ask about updating CXr because of cough.  02/08/2014 Follow up Asthma  Here with interpretor   never smoker, (deaf/ lip reading),  Mild intermittent asthma, allergi rhinitis complicated by DM2, resolved OSA,    Had episode of increased SOB, wheezing  dry cough x1 night on 9/27.  took Mucinex DM and albuterol neb with total resolution of symptoms.  Feels she is back to baseline with no recurrent symptoms.   did miss allergy injection last week, wonders if this caused symptoms. Got allergy vaccine yesterday without trouble  Denies hemoptysis , chest pain, orthopnea, edema or fever.  04/25/14- 22 yoF never smoker, (deaf/ lip reading),  Mild intermittent asthma, allergi rhinitis complicated by DM2, resolved OSA,      Mother and signing interpreter here. ACUTE VISIT: Sore chest from cough-yellow, sinus drainage as well since Saturday. Nose is sore and red; drinking plenty of fluids to keep from getting dried out.  CXR 09/21/13 IMPRESSION: No active disease. Electronically Signed  By: Aletta Edouard M.D.  On: 09/21/2013 16:09   06/23/14- 26 yoF never smoker, (deaf/ lip reading),  Mild intermittent asthma, allergic rhinitis complicated by DM2, resolved OSA,     signing interpreter here. FOLLOWS FOR: Having dry nose with SOB-snoring. Still on vaccine and doing well Continues allergy vaccine at 1:50 GH. Allergy shots helpful. Got home air filter which helps. Indoor heat dry eyes or nose. This is increasing stuffiness and causing her to snore.  12/22/14- 67 yoF  never smoker, (deaf/ lip reading),  Mild intermittent asthma, allergic rhinitis complicated by DM2, resolved OSA,     signing interpreter and mother here. FOLLOWS FOR: Pt continues allergy vaccine 1:50 and doing well.  Had a difficult spring with wheeze nasal congestion and cough. Not clear if this is all allergy or she had a viral infection during that time as well. Much better now. Feels well today. Reviewed medications.  06/26/2015-55 year old female never smoker (deaf/lip reading), mild intermittent asthma, allergic rhinitis complicated by DM 2, resolved OSA Signing interpreter here  06/29/2015-55 year old female never smoker (deaf/lip reading), mild intermittent asthma, allergic rhinitis complicated by DM 2, resolved OSA ACUTE VISIT: Pt would like to have depo and breathing tx; still coughing-white and thick and wheezing. Pt denies any fever or chills. Mother had called requesting treatment which has helped Meelah in the past.  ROS-see HPI Constitutional:   No-   weight loss, night sweats, fevers, chills,  fatigue, lassitude. HEENT:   No-  headaches, difficulty swallowing, tooth/dental problems, sore throat, She is deaf- reads lips and signs.     No- sneezing, itching, ear ache, +nasal congestion, post nasal drip,  CV:  No-   chest pain, orthopnea, PND, swelling in lower extremities, anasarca, dizziness, palpitations Resp:No- productive cough, + nonproductive cough Skin: No-   rash or lesions. GI:  No  heartburn, indigestion, abdominal pain, nausea, vomiting,  GU:  MS:  No-   joint pain or swelling.   Neuro-     nothing unusual Psych:  No- change in mood or affect. No depression or anxiety.  No memory loss.   OBJ- Physical Exam General- Alert, Oriented, Affect-appropriate, Distress- none acute. Looks well Skin- rash-none, lesions- none, excoriation- none Lymphadenopathy- none Head- atraumatic            Eyes- Gross vision intact, PERRLA, conjunctivae and secretions clear             Ears- deaf            Nose- +mild turbinate edema, no-Septal dev, mucus, polyps, erosion, perforation             Throat- Mallampati II , mucosa-clear , drainage- none, tonsils- atrophic Neck- flexible , trachea midline, no stridor , thyroid nl, carotid no bruit Chest - symmetrical excursion , unlabored           Heart/CV- RRR , no murmur , no gallop  , no rub, nl s1 s2                           - JVD- none , edema-  none, stasis changes- none, varices- none           Lung-  wheeze-plus mild anterior unlabored,  rub- none           Chest wall-  Abd- Br/ Gen/ Rectal- Not done, not indicated Extrem- cyanosis- none, clubbing, none, atrophy- none, strength- nl Neuro- grossly intact to observation

## 2015-06-29 NOTE — Telephone Encounter (Signed)
Spoke with pt's mother Briza, states that she is still coughing and vomiting up clear colored mucus, low grade temp (99.0).  Pt also sob with exertion, fatigue, chills.  Pt has been taking ibuprofen as well as following recs from Monday's visit.  Pt's mother requesting further recs.  Pt's mother also notes that pt used to come in for neb treatments when she felt like this, wonders if this is an option for pt.    [facility] uses [facility] on [address].    CY please advise on further recs.  Thanks!   Allergies  Allergen Reactions  . Doxycycline Nausea And Vomiting  . Other     Dog, cat, nuts, grass, trees   Current Outpatient Prescriptions on File Prior to Visit  Medication Sig Dispense Refill  . albuterol (PROVENTIL HFA;VENTOLIN HFA) 108 (90 BASE) MCG/ACT inhaler Inhale 2 puffs into the lungs every 6 (six) hours as needed for wheezing or shortness of breath. 1 Inhaler prn  . albuterol (PROVENTIL) (2.5 MG/3ML) 0.083% nebulizer solution Take 3 mLs (2.5 mg total) by nebulization every 6 (six) hours as needed for wheezing or shortness of breath. 75 mL 12  . ALPRAZolam (XANAX) 0.5 MG tablet Take 1 tablet (0.5 mg total) by mouth at bedtime as needed for anxiety. 30 tablet 0  . ARIPiprazole (ABILIFY) 15 MG tablet Take 15 mg by mouth daily.  0  . Cholecalciferol (VITAMIN D3) 2000 units capsule Take 1 capsule (2,000 Units total) by mouth daily. 100 capsule 3  . estradiol (ESTRACE) 1 MG tablet Take 1 tablet (1 mg total) by mouth daily. 90 tablet 1  . fluticasone (FLONASE) 50 MCG/ACT nasal spray Place 2 sprays into the nose daily. 16 g prn  . levothyroxine (SYNTHROID, LEVOTHROID) 75 MCG tablet Take 1 tablet (75 mcg total) by mouth daily. 90 tablet 1  . Linaclotide (LINZESS) 290 MCG CAPS capsule Take 1 capsule (290 mcg total) by mouth daily. 30 capsule 6  . loperamide (IMODIUM) 2 MG capsule Take 2 mg by mouth as needed for diarrhea or loose stools.    . meloxicam (MOBIC) 15 MG tablet Take 1 tablet (15 mg  total) by mouth daily as needed for pain. 30 tablet 1  . mometasone-formoterol (DULERA) 100-5 MCG/ACT AERO 2 puffs then rinse mouth, twice daily - maintenance 1 Inhaler prn  . NITRO-BID 2 % ointment Place 1 application rectally. Mon, Wed, and Fri    . NONFORMULARY OR COMPOUNDED ITEM Allergy Vaccine 1:50 Given at [facility]    . traMADol (ULTRAM) 50 MG tablet Take 1-2 tablets (50-100 mg total) by mouth 2 (two) times daily as needed for severe pain. 100 tablet 1   No current facility-administered medications on file prior to visit.

## 2015-06-29 NOTE — Telephone Encounter (Signed)
Pt scheduled for 2:30 appt.  Nothing further needed.

## 2015-07-08 NOTE — Assessment & Plan Note (Signed)
Acute upper respiratory infection/bronchitis, probably viral syndrome. So far she is not wheezing much. Plan-fluids and supportive care

## 2015-07-08 NOTE — Assessment & Plan Note (Signed)
Continues allergy vaccine 1:50 GH

## 2015-07-11 ENCOUNTER — Ambulatory Visit (INDEPENDENT_AMBULATORY_CARE_PROVIDER_SITE_OTHER): Payer: Medicare Other | Admitting: *Deleted

## 2015-07-11 ENCOUNTER — Telehealth: Payer: Self-pay | Admitting: Internal Medicine

## 2015-07-11 DIAGNOSIS — J309 Allergic rhinitis, unspecified: Secondary | ICD-10-CM | POA: Diagnosis not present

## 2015-07-11 NOTE — Telephone Encounter (Signed)
Ok with me 

## 2015-07-11 NOTE — Telephone Encounter (Signed)
Patient's mother is a patient of Dr. Camila Li.  Patient is requesting to transfer from [place] to [place].  Please advise.

## 2015-07-11 NOTE — Telephone Encounter (Signed)
Ok w/me Thx 

## 2015-07-12 ENCOUNTER — Ambulatory Visit: Payer: Self-pay | Admitting: Internal Medicine

## 2015-07-12 NOTE — Telephone Encounter (Signed)
Got patient switched over

## 2015-07-25 ENCOUNTER — Ambulatory Visit (INDEPENDENT_AMBULATORY_CARE_PROVIDER_SITE_OTHER): Payer: Medicare Other | Admitting: *Deleted

## 2015-07-25 ENCOUNTER — Other Ambulatory Visit: Payer: Self-pay | Admitting: Internal Medicine

## 2015-07-25 DIAGNOSIS — J309 Allergic rhinitis, unspecified: Secondary | ICD-10-CM | POA: Diagnosis not present

## 2015-07-27 ENCOUNTER — Ambulatory Visit (INDEPENDENT_AMBULATORY_CARE_PROVIDER_SITE_OTHER): Payer: Medicare Other | Admitting: Psychology

## 2015-07-27 DIAGNOSIS — F411 Generalized anxiety disorder: Secondary | ICD-10-CM | POA: Diagnosis not present

## 2015-08-02 ENCOUNTER — Ambulatory Visit (INDEPENDENT_AMBULATORY_CARE_PROVIDER_SITE_OTHER): Payer: Medicare Other | Admitting: Internal Medicine

## 2015-08-02 ENCOUNTER — Other Ambulatory Visit (INDEPENDENT_AMBULATORY_CARE_PROVIDER_SITE_OTHER): Payer: Medicare Other

## 2015-08-02 ENCOUNTER — Encounter: Payer: Self-pay | Admitting: Internal Medicine

## 2015-08-02 VITALS — BP 140/72 | HR 82 | Wt 208.0 lb

## 2015-08-02 DIAGNOSIS — R739 Hyperglycemia, unspecified: Secondary | ICD-10-CM

## 2015-08-02 DIAGNOSIS — R5383 Other fatigue: Secondary | ICD-10-CM | POA: Diagnosis not present

## 2015-08-02 DIAGNOSIS — J0100 Acute maxillary sinusitis, unspecified: Secondary | ICD-10-CM

## 2015-08-02 DIAGNOSIS — E034 Atrophy of thyroid (acquired): Secondary | ICD-10-CM | POA: Diagnosis not present

## 2015-08-02 DIAGNOSIS — J019 Acute sinusitis, unspecified: Secondary | ICD-10-CM | POA: Insufficient documentation

## 2015-08-02 DIAGNOSIS — E038 Other specified hypothyroidism: Secondary | ICD-10-CM

## 2015-08-02 LAB — BASIC METABOLIC PANEL
BUN: 8 mg/dL (ref 6–23)
CHLORIDE: 100 meq/L (ref 96–112)
CO2: 29 meq/L (ref 19–32)
CREATININE: 0.76 mg/dL (ref 0.40–1.20)
Calcium: 9.3 mg/dL (ref 8.4–10.5)
GFR: 84.25 mL/min (ref >60.00)
Glucose, Bld: 91 mg/dL (ref 70–99)
POTASSIUM: 3.4 meq/L — AB (ref 3.5–5.1)
SODIUM: 138 meq/L (ref 135–145)

## 2015-08-02 LAB — URINALYSIS, ROUTINE W REFLEX MICROSCOPIC
BILIRUBIN URINE: NEGATIVE
KETONES UR: NEGATIVE
LEUKOCYTES UA: NEGATIVE
NITRITE: NEGATIVE
Specific Gravity, Urine: <=1.005 — AB (ref 1.000–1.030)
TOTAL PROTEIN, URINE-UPE24: NEGATIVE
URINE GLUCOSE: NEGATIVE
UROBILINOGEN UA: 0.2 (ref 0.0–1.0)
WBC UA: NONE SEEN (ref 0–?)
pH: 6 (ref 5.0–8.0)

## 2015-08-02 LAB — TSH: TSH: 0.84 u[IU]/mL (ref 0.35–4.50)

## 2015-08-02 LAB — HEMOGLOBIN A1C: Hgb A1c MFr Bld: 6.1 % (ref 4.6–6.5)

## 2015-08-02 LAB — T3, FREE: T3 FREE: 3.3 pg/mL (ref 2.3–4.2)

## 2015-08-02 MED ORDER — CEFDINIR 300 MG PO CAPS: 300.0000 mg | ORAL_CAPSULE | Freq: Two times a day (BID) | ORAL | Status: DC

## 2015-08-02 NOTE — Assessment & Plan Note (Signed)
Labs

## 2015-08-02 NOTE — Assessment & Plan Note (Signed)
Cefdinir x10 d 

## 2015-08-02 NOTE — Progress Notes (Signed)
Subjective:  Patient ID: Heidi Good, female    DOB: 11/16/1960  Age: 55 y.o. MRN: [id number]  CC: No chief complaint on file.   HPI Heidi Good presents for fatigue x 1 mo. Pt had a sleep test 2013 - Dr Annamaria Boots. 5 dogs  Outpatient Prescriptions Prior to Visit  Medication Sig Dispense Refill  . albuterol (PROVENTIL HFA;VENTOLIN HFA) 108 (90 BASE) MCG/ACT inhaler Inhale 2 puffs into the lungs every 6 (six) hours as needed for wheezing or shortness of breath. 1 Inhaler prn  . albuterol (PROVENTIL) (2.5 MG/3ML) 0.083% nebulizer solution Take 3 mLs (2.5 mg total) by nebulization every 6 (six) hours as needed for wheezing or shortness of breath. 75 mL 12  . ALPRAZolam (XANAX) 0.5 MG tablet Take 1 tablet (0.5 mg total) by mouth at bedtime as needed for anxiety. 30 tablet 0  . ARIPiprazole (ABILIFY) 15 MG tablet Take 15 mg by mouth daily.  0  . Cholecalciferol (VITAMIN D3) 2000 units capsule Take 1 capsule (2,000 Units total) by mouth daily. 100 capsule 3  . estradiol (ESTRACE) 1 MG tablet Take 1 tablet (1 mg total) by mouth daily. 90 tablet 1  . fluticasone (FLONASE) 50 MCG/ACT nasal spray Place 2 sprays into the nose daily. 16 g prn  . levothyroxine (SYNTHROID, LEVOTHROID) 75 MCG tablet Take 1 tablet (75 mcg total) by mouth daily. 90 tablet 1  . Linaclotide (LINZESS) 290 MCG CAPS capsule Take 1 capsule (290 mcg total) by mouth daily. 30 capsule 6  . loperamide (IMODIUM) 2 MG capsule Take 2 mg by mouth as needed for diarrhea or loose stools.    . mometasone-formoterol (DULERA) 100-5 MCG/ACT AERO 2 puffs then rinse mouth, twice daily - maintenance 1 Inhaler prn  . NITRO-BID 2 % ointment Place 1 application rectally. Mon, Wed, and Fri    . NONFORMULARY OR COMPOUNDED ITEM Allergy Vaccine 1:50 Given at [facility]    . meloxicam (MOBIC) 15 MG tablet TAKE 1 TABLET EVERY DAY AS NEEDED FOR PAIN (Patient not taking: Reported on 08/02/2015) 30 tablet 1  . traMADol (ULTRAM) 50 MG tablet Take  1-2 tablets (50-100 mg total) by mouth 2 (two) times daily as needed for severe pain. (Patient not taking: Reported on 08/02/2015) 100 tablet 1   No facility-administered medications prior to visit.    ROS Review of Systems  Constitutional: Positive for fatigue. Negative for chills, activity change, appetite change and unexpected weight change.  HENT: Positive for hearing loss. Negative for congestion, mouth sores, nosebleeds and sinus pressure.   Eyes: Negative for visual disturbance.  Respiratory: Negative for cough and chest tightness.   Gastrointestinal: Negative for nausea and abdominal pain.  Genitourinary: Negative for frequency, difficulty urinating and vaginal pain.  Musculoskeletal: Negative for back pain and gait problem.  Skin: Negative for pallor and rash.  Neurological: Negative for dizziness, tremors, weakness, numbness and headaches.  Psychiatric/Behavioral: Positive for decreased concentration. Negative for suicidal ideas, confusion and sleep disturbance. The patient is not nervous/anxious.     Objective:  BP 140/72 mmHg  Pulse 82  Wt 208 lb (94.348 kg)  SpO2 96%  BP Readings from Last 3 Encounters:  08/02/15 140/72  06/29/15 116/70  06/26/15 118/72    Wt Readings from Last 3 Encounters:  08/02/15 208 lb (94.348 kg)  06/29/15 209 lb 9.6 oz (95.074 kg)  06/26/15 210 lb 3.2 oz (95.346 kg)    Physical Exam  Constitutional: She appears well-developed. No distress.  HENT:  Head:  Normocephalic.  Right Ear: External ear normal.  Left Ear: External ear normal.  Nose: Nose normal.  Mouth/Throat: Oropharynx is clear and moist. No oropharyngeal exudate.  Eyes: Conjunctivae are normal. Pupils are equal, round, and reactive to light. Right eye exhibits no discharge. Left eye exhibits no discharge.  Neck: Normal range of motion. Neck supple. No JVD present. No tracheal deviation present. No thyromegaly present.  Cardiovascular: Normal rate, regular rhythm and normal  heart sounds.   Pulmonary/Chest: No stridor. No respiratory distress. She has no wheezes.  Abdominal: Soft. Bowel sounds are normal. She exhibits no distension and no mass. There is no tenderness. There is no rebound and no guarding.  Musculoskeletal: She exhibits no edema or tenderness.  Lymphadenopathy:    She has no cervical adenopathy.  Neurological: She displays normal reflexes. No cranial nerve deficit. She exhibits normal muscle tone. Coordination normal.  Skin: No rash noted. No erythema.  Psychiatric: She has a normal mood and affect. Her behavior is normal. Judgment and thought content normal.  Obese  Lab Results  Component Value Date   WBC 10.7* 03/10/2015   HGB 15.2* 03/10/2015   HCT 45.3 03/10/2015   PLT 207.0 03/10/2015   GLUCOSE 91 08/02/2015   CHOL 137 03/10/2015   TRIG 194.0* 03/10/2015   HDL 43.10 03/10/2015   LDLCALC 55 03/10/2015   ALT 29 03/10/2015   AST 39* 03/10/2015   NA 138 08/02/2015   K 3.4* 08/02/2015   CL 100 08/02/2015   CREATININE 0.76 08/02/2015   BUN 8 08/02/2015   CO2 29 08/02/2015   TSH 0.84 08/02/2015   HGBA1C 6.1 08/02/2015    Mm Digital Screening Bilateral  03/01/2015  CLINICAL DATA:  Screening. EXAM: DIGITAL SCREENING BILATERAL MAMMOGRAM WITH CAD COMPARISON:  Previous exam(s). ACR Breast Density Category c: The breast tissue is heterogeneously dense, which may obscure small masses. FINDINGS: There are no findings suspicious for malignancy. Images were processed with CAD. IMPRESSION: No mammographic evidence of malignancy. A result letter of this screening mammogram will be mailed directly to the patient. RECOMMENDATION: Screening mammogram in one year. (Code:SM-B-01Y) BI-RADS CATEGORY  1: Negative. Electronically Signed   By: Margarette Canada M.D.   On: 03/01/2015 16:38    Assessment & Plan:   Diagnoses and all orders for this visit:  Hypothyroidism due to acquired atrophy of thyroid -     TSH; Future -     T3, free; Future -      Hemoglobin A1c; Future -     Basic metabolic panel; Future -     Urinalysis; Future  Other fatigue -     TSH; Future -     T3, free; Future -     Hemoglobin A1c; Future -     Basic metabolic panel; Future -     Urinalysis; Future  Acute maxillary sinusitis, recurrence not specified -     TSH; Future -     T3, free; Future -     Hemoglobin A1c; Future -     Basic metabolic panel; Future -     Urinalysis; Future  Hyperglycemia -     TSH; Future -     T3, free; Future -     Hemoglobin A1c; Future -     Basic metabolic panel; Future -     Urinalysis; Future  Other orders -     cefdinir (OMNICEF) 300 MG capsule; Take 1 capsule (300 mg total) by mouth 2 (two) times daily.  I am  having Ms. Gwyn start on cefdinir. I am also having her maintain her fluticasone, albuterol, ALPRAZolam, albuterol, loperamide, Linaclotide, NITRO-BID, mometasone-formoterol, NONFORMULARY OR COMPOUNDED ITEM, ARIPiprazole, levothyroxine, estradiol, traMADol, Vitamin D3, and meloxicam.  Meds ordered this encounter  Medications  . cefdinir (OMNICEF) 300 MG capsule    Sig: Take 1 capsule (300 mg total) by mouth 2 (two) times daily.    Dispense:  20 capsule    Refill:  0     Follow-up: Return in about 3 months (around 11/02/2015) for a follow-up visit.  Walker Kehr, MD

## 2015-08-02 NOTE — Progress Notes (Signed)
Pre visit review using our clinic review tool, if applicable. No additional management support is needed unless otherwise documented below in the visit note. 

## 2015-08-07 ENCOUNTER — Telehealth: Payer: Self-pay | Admitting: Internal Medicine

## 2015-08-07 NOTE — Telephone Encounter (Signed)
Please call back labs results.

## 2015-08-08 ENCOUNTER — Ambulatory Visit (INDEPENDENT_AMBULATORY_CARE_PROVIDER_SITE_OTHER): Payer: Medicare Other

## 2015-08-08 DIAGNOSIS — J309 Allergic rhinitis, unspecified: Secondary | ICD-10-CM | POA: Diagnosis not present

## 2015-08-08 NOTE — Telephone Encounter (Signed)
Pt informed of below Notes Recorded by Ander Slade, RN on 08/03/2015 at 9:54 AM Labs mailed to patient Notes Recorded by Cassandria Anger, MD on 08/02/2015 at 10:50 PM Please, mail the labs to the patient - they are normal. Thx

## 2015-08-14 DIAGNOSIS — F200 Paranoid schizophrenia: Secondary | ICD-10-CM | POA: Diagnosis not present

## 2015-08-22 ENCOUNTER — Ambulatory Visit (INDEPENDENT_AMBULATORY_CARE_PROVIDER_SITE_OTHER): Payer: Medicare Other

## 2015-08-22 DIAGNOSIS — J309 Allergic rhinitis, unspecified: Secondary | ICD-10-CM

## 2015-09-05 ENCOUNTER — Ambulatory Visit (INDEPENDENT_AMBULATORY_CARE_PROVIDER_SITE_OTHER): Payer: Medicare Other

## 2015-09-05 DIAGNOSIS — J309 Allergic rhinitis, unspecified: Secondary | ICD-10-CM

## 2015-09-11 ENCOUNTER — Ambulatory Visit: Payer: Self-pay | Admitting: Internal Medicine

## 2015-09-14 ENCOUNTER — Ambulatory Visit (INDEPENDENT_AMBULATORY_CARE_PROVIDER_SITE_OTHER): Payer: Medicare Other | Admitting: Psychology

## 2015-09-14 DIAGNOSIS — F411 Generalized anxiety disorder: Secondary | ICD-10-CM

## 2015-09-19 ENCOUNTER — Ambulatory Visit (INDEPENDENT_AMBULATORY_CARE_PROVIDER_SITE_OTHER): Payer: Medicare Other | Admitting: *Deleted

## 2015-09-19 DIAGNOSIS — J309 Allergic rhinitis, unspecified: Secondary | ICD-10-CM

## 2015-09-22 DIAGNOSIS — M7711 Lateral epicondylitis, right elbow: Secondary | ICD-10-CM | POA: Diagnosis not present

## 2015-09-26 DIAGNOSIS — H10413 Chronic giant papillary conjunctivitis, bilateral: Secondary | ICD-10-CM | POA: Diagnosis not present

## 2015-09-28 ENCOUNTER — Encounter: Payer: Self-pay | Admitting: Internal Medicine

## 2015-10-03 ENCOUNTER — Ambulatory Visit (INDEPENDENT_AMBULATORY_CARE_PROVIDER_SITE_OTHER): Payer: Medicare Other

## 2015-10-03 DIAGNOSIS — J309 Allergic rhinitis, unspecified: Secondary | ICD-10-CM | POA: Diagnosis not present

## 2015-10-17 ENCOUNTER — Ambulatory Visit (INDEPENDENT_AMBULATORY_CARE_PROVIDER_SITE_OTHER): Payer: Medicare Other

## 2015-10-17 DIAGNOSIS — J309 Allergic rhinitis, unspecified: Secondary | ICD-10-CM

## 2015-10-26 ENCOUNTER — Ambulatory Visit (INDEPENDENT_AMBULATORY_CARE_PROVIDER_SITE_OTHER): Payer: Medicare Other | Admitting: Psychology

## 2015-10-26 DIAGNOSIS — F411 Generalized anxiety disorder: Secondary | ICD-10-CM

## 2015-10-31 ENCOUNTER — Ambulatory Visit (INDEPENDENT_AMBULATORY_CARE_PROVIDER_SITE_OTHER): Payer: Medicare Other

## 2015-10-31 DIAGNOSIS — J309 Allergic rhinitis, unspecified: Secondary | ICD-10-CM

## 2015-11-03 ENCOUNTER — Ambulatory Visit (INDEPENDENT_AMBULATORY_CARE_PROVIDER_SITE_OTHER): Payer: Medicare Other | Admitting: Internal Medicine

## 2015-11-03 ENCOUNTER — Encounter: Payer: Self-pay | Admitting: Internal Medicine

## 2015-11-03 VITALS — BP 130/70 | HR 74 | Wt 205.0 lb

## 2015-11-03 DIAGNOSIS — E034 Atrophy of thyroid (acquired): Secondary | ICD-10-CM | POA: Diagnosis not present

## 2015-11-03 DIAGNOSIS — E876 Hypokalemia: Secondary | ICD-10-CM

## 2015-11-03 DIAGNOSIS — R5383 Other fatigue: Secondary | ICD-10-CM

## 2015-11-03 DIAGNOSIS — E038 Other specified hypothyroidism: Secondary | ICD-10-CM | POA: Diagnosis not present

## 2015-11-03 MED ORDER — POTASSIUM CHLORIDE ER 8 MEQ PO TBCR: 8.0000 meq | EXTENDED_RELEASE_TABLET | Freq: Every day | ORAL | Status: DC

## 2015-11-03 MED ORDER — LEVOTHYROXINE SODIUM 75 MCG PO TABS: 75.0000 ug | ORAL_TABLET | Freq: Every day | ORAL | Status: DC

## 2015-11-03 NOTE — Assessment & Plan Note (Addendum)
Chronic  Start KCl

## 2015-11-03 NOTE — Progress Notes (Signed)
Subjective:  Patient ID: Heidi Good, female    DOB: February 26, 1961  Age: 55 y.o. MRN: [id number]  CC: No chief complaint on file.   HPI Heidi Good [facility] presents for fatigue, asthma, fatigue and hypothyroidism f/u  Outpatient Prescriptions Prior to Visit  Medication Sig Dispense Refill  . albuterol (PROVENTIL HFA;VENTOLIN HFA) 108 (90 BASE) MCG/ACT inhaler Inhale 2 puffs into the lungs every 6 (six) hours as needed for wheezing or shortness of breath. 1 Inhaler prn  . albuterol (PROVENTIL) (2.5 MG/3ML) 0.083% nebulizer solution Take 3 mLs (2.5 mg total) by nebulization every 6 (six) hours as needed for wheezing or shortness of breath. 75 mL 12  . ALPRAZolam (XANAX) 0.5 MG tablet Take 1 tablet (0.5 mg total) by mouth at bedtime as needed for anxiety. 30 tablet 0  . ARIPiprazole (ABILIFY) 15 MG tablet Take 15 mg by mouth daily.  0  . cefdinir (OMNICEF) 300 MG capsule Take 1 capsule (300 mg total) by mouth 2 (two) times daily. 20 capsule 0  . Cholecalciferol (VITAMIN D3) 2000 units capsule Take 1 capsule (2,000 Units total) by mouth daily. 100 capsule 3  . estradiol (ESTRACE) 1 MG tablet Take 1 tablet (1 mg total) by mouth daily. 90 tablet 1  . fluticasone (FLONASE) 50 MCG/ACT nasal spray Place 2 sprays into the nose daily. 16 g prn  . levothyroxine (SYNTHROID, LEVOTHROID) 75 MCG tablet Take 1 tablet (75 mcg total) by mouth daily. 90 tablet 1  . Linaclotide (LINZESS) 290 MCG CAPS capsule Take 1 capsule (290 mcg total) by mouth daily. 30 capsule 6  . loperamide (IMODIUM) 2 MG capsule Take 2 mg by mouth as needed for diarrhea or loose stools.    . meloxicam (MOBIC) 15 MG tablet TAKE 1 TABLET EVERY DAY AS NEEDED FOR PAIN 30 tablet 1  . mometasone-formoterol (DULERA) 100-5 MCG/ACT AERO 2 puffs then rinse mouth, twice daily - maintenance 1 Inhaler prn  . NITRO-BID 2 % ointment Place 1 application rectally. Mon, Wed, and Fri    . NONFORMULARY OR COMPOUNDED ITEM Allergy Vaccine 1:50 Given at  [facility]    . traMADol (ULTRAM) 50 MG tablet Take 1-2 tablets (50-100 mg total) by mouth 2 (two) times daily as needed for severe pain. 100 tablet 1   No facility-administered medications prior to visit.    ROS Review of Systems  Constitutional: Positive for fatigue. Negative for chills, activity change, appetite change and unexpected weight change.  HENT: Positive for hearing loss. Negative for congestion, mouth sores and sinus pressure.   Eyes: Negative for visual disturbance.  Respiratory: Negative for cough and chest tightness.   Gastrointestinal: Negative for nausea and abdominal pain.  Genitourinary: Negative for frequency, difficulty urinating and vaginal pain.  Musculoskeletal: Negative for back pain and gait problem.  Skin: Negative for pallor and rash.  Neurological: Negative for dizziness, tremors, weakness, numbness and headaches.  Psychiatric/Behavioral: Negative for confusion and sleep disturbance.    Objective:  BP 130/70 mmHg  Pulse 74  Wt 205 lb (92.987 kg)  SpO2 96%  BP Readings from Last 3 Encounters:  11/03/15 130/70  08/02/15 140/72  06/29/15 116/70    Wt Readings from Last 3 Encounters:  11/03/15 205 lb (92.987 kg)  08/02/15 208 lb (94.348 kg)  06/29/15 209 lb 9.6 oz (95.074 kg)    Physical Exam  Constitutional: She appears well-developed. No distress.  HENT:  Head: Normocephalic.  Right Ear: External ear normal.  Left Ear: External ear normal.  Nose: Nose normal.  Mouth/Throat: Oropharynx is clear and moist.  Eyes: Conjunctivae are normal. Pupils are equal, round, and reactive to light. Right eye exhibits no discharge. Left eye exhibits no discharge.  Neck: Normal range of motion. Neck supple. No JVD present. No tracheal deviation present. No thyromegaly present.  Cardiovascular: Normal rate, regular rhythm and normal heart sounds.   Pulmonary/Chest: No stridor. No respiratory distress. She has no wheezes.  Abdominal: Soft. Bowel  sounds are normal. She exhibits no distension and no mass. There is no tenderness. There is no rebound and no guarding.  Musculoskeletal: She exhibits no edema or tenderness.  Lymphadenopathy:    She has no cervical adenopathy.  Neurological: She displays normal reflexes. No cranial nerve deficit. She exhibits normal muscle tone. Coordination normal.  Skin: No rash noted. No erythema.  Psychiatric: She has a normal mood and affect. Her behavior is normal. Judgment and thought content normal.  Obese Deaf  Lab Results  Component Value Date   WBC 10.7* 03/10/2015   HGB 15.2* 03/10/2015   HCT 45.3 03/10/2015   PLT 207.0 03/10/2015   GLUCOSE 91 08/02/2015   CHOL 137 03/10/2015   TRIG 194.0* 03/10/2015   HDL 43.10 03/10/2015   LDLCALC 55 03/10/2015   ALT 29 03/10/2015   AST 39* 03/10/2015   NA 138 08/02/2015   K 3.4* 08/02/2015   CL 100 08/02/2015   CREATININE 0.76 08/02/2015   BUN 8 08/02/2015   CO2 29 08/02/2015   TSH 0.84 08/02/2015   HGBA1C 6.1 08/02/2015    Mm Digital Screening Bilateral  03/01/2015  CLINICAL DATA:  Screening. EXAM: DIGITAL SCREENING BILATERAL MAMMOGRAM WITH CAD COMPARISON:  Previous exam(s). ACR Breast Density Category c: The breast tissue is heterogeneously dense, which may obscure small masses. FINDINGS: There are no findings suspicious for malignancy. Images were processed with CAD. IMPRESSION: No mammographic evidence of malignancy. A result letter of this screening mammogram will be mailed directly to the patient. RECOMMENDATION: Screening mammogram in one year. (Code:SM-B-01Y) BI-RADS CATEGORY  1: Negative. Electronically Signed   By: Margarette Canada M.D.   On: 03/01/2015 16:38    Assessment & Plan:   There are no diagnoses linked to this encounter. I am having [name] maintain her fluticasone, albuterol, ALPRAZolam, albuterol, loperamide, linaclotide, NITRO-BID, mometasone-formoterol, NONFORMULARY OR COMPOUNDED ITEM, ARIPiprazole, levothyroxine,  estradiol, traMADol, Vitamin D3, meloxicam, and cefdinir.  No orders of the defined types were placed in this encounter.     Follow-up: No Follow-up on file.  Walker Kehr, MD

## 2015-11-03 NOTE — Progress Notes (Signed)
Pre visit review using our clinic review tool, if applicable. No additional management support is needed unless otherwise documented below in the visit note. 

## 2015-11-03 NOTE — Assessment & Plan Note (Signed)
Levothroid 

## 2015-11-05 ENCOUNTER — Encounter: Payer: Self-pay | Admitting: Internal Medicine

## 2015-11-07 ENCOUNTER — Ambulatory Visit (INDEPENDENT_AMBULATORY_CARE_PROVIDER_SITE_OTHER): Payer: Medicare Other

## 2015-11-07 ENCOUNTER — Other Ambulatory Visit: Payer: Self-pay | Admitting: Internal Medicine

## 2015-11-07 DIAGNOSIS — J309 Allergic rhinitis, unspecified: Secondary | ICD-10-CM

## 2015-11-09 DIAGNOSIS — L821 Other seborrheic keratosis: Secondary | ICD-10-CM | POA: Diagnosis not present

## 2015-11-09 DIAGNOSIS — L820 Inflamed seborrheic keratosis: Secondary | ICD-10-CM | POA: Diagnosis not present

## 2015-11-09 DIAGNOSIS — D1801 Hemangioma of skin and subcutaneous tissue: Secondary | ICD-10-CM | POA: Diagnosis not present

## 2015-11-09 DIAGNOSIS — L814 Other melanin hyperpigmentation: Secondary | ICD-10-CM | POA: Diagnosis not present

## 2015-11-09 DIAGNOSIS — L918 Other hypertrophic disorders of the skin: Secondary | ICD-10-CM | POA: Diagnosis not present

## 2015-11-09 DIAGNOSIS — D225 Melanocytic nevi of trunk: Secondary | ICD-10-CM | POA: Diagnosis not present

## 2015-11-21 ENCOUNTER — Ambulatory Visit (INDEPENDENT_AMBULATORY_CARE_PROVIDER_SITE_OTHER): Payer: Medicare Other

## 2015-11-21 DIAGNOSIS — J309 Allergic rhinitis, unspecified: Secondary | ICD-10-CM

## 2015-11-30 ENCOUNTER — Ambulatory Visit (INDEPENDENT_AMBULATORY_CARE_PROVIDER_SITE_OTHER): Payer: Medicare Other | Admitting: Psychology

## 2015-11-30 DIAGNOSIS — F411 Generalized anxiety disorder: Secondary | ICD-10-CM | POA: Diagnosis not present

## 2015-12-05 ENCOUNTER — Ambulatory Visit (INDEPENDENT_AMBULATORY_CARE_PROVIDER_SITE_OTHER): Payer: Medicare Other | Admitting: *Deleted

## 2015-12-05 DIAGNOSIS — J309 Allergic rhinitis, unspecified: Secondary | ICD-10-CM | POA: Diagnosis not present

## 2015-12-06 DIAGNOSIS — M25552 Pain in left hip: Secondary | ICD-10-CM | POA: Diagnosis not present

## 2015-12-06 DIAGNOSIS — M7062 Trochanteric bursitis, left hip: Secondary | ICD-10-CM | POA: Diagnosis not present

## 2015-12-19 ENCOUNTER — Ambulatory Visit (INDEPENDENT_AMBULATORY_CARE_PROVIDER_SITE_OTHER): Payer: Medicare Other

## 2015-12-19 DIAGNOSIS — J309 Allergic rhinitis, unspecified: Secondary | ICD-10-CM

## 2015-12-25 ENCOUNTER — Ambulatory Visit (INDEPENDENT_AMBULATORY_CARE_PROVIDER_SITE_OTHER): Payer: Medicare Other | Admitting: Internal Medicine

## 2015-12-25 ENCOUNTER — Encounter: Payer: Self-pay | Admitting: Internal Medicine

## 2015-12-25 ENCOUNTER — Encounter (INDEPENDENT_AMBULATORY_CARE_PROVIDER_SITE_OTHER): Payer: Self-pay

## 2015-12-25 DIAGNOSIS — J3089 Other allergic rhinitis: Secondary | ICD-10-CM

## 2015-12-25 DIAGNOSIS — J302 Other seasonal allergic rhinitis: Secondary | ICD-10-CM

## 2015-12-25 DIAGNOSIS — J4520 Mild intermittent asthma, uncomplicated: Secondary | ICD-10-CM | POA: Diagnosis not present

## 2015-12-25 DIAGNOSIS — J309 Allergic rhinitis, unspecified: Secondary | ICD-10-CM | POA: Diagnosis not present

## 2015-12-25 NOTE — Assessment & Plan Note (Signed)
Asthma mild intermittent uncomplicated well-controlled without recent need for rescue inhaler and without sleep disturbance. She does not need refills today and will contact us when she does.

## 2015-12-25 NOTE — Assessment & Plan Note (Signed)
I explained that we plan to close our allergy clinic here as I slow down this winter. I suggested she stop shots then and watch to see how she does, with option to see one of the other allergy practices in town if needed.

## 2015-12-25 NOTE — Patient Instructions (Signed)
Ok to continue present breathing medicines and to contact our office for refills as needed.  We will be closing the allergy clinic this winter, probably in January, as I slow down to part-time by Spring. There will no longer be an allergist here, so we will not be continuing allergy shots here after that.  My suggestion will be to stop your allergy shots when our clinic closes, and wait to see how you do. You can still take antihistamines like claritin, and nasal sprays like flonase, if needed for allergic nose problems.  When the time comes, we can help you, or you can ask Dr Alain Marion, to help you transfer to one of the other allergy practices in town. There is one at [place] and one on [address], across from [facility].  They would help you if you need to stay on allergy shots.

## 2015-12-25 NOTE — Progress Notes (Signed)
HPI  Fnever smoker, self-referred for help with snoring. I had seen her for asthma as a teenager.       Deaf since meningitis as an infant,  04/25/14- 53 yoF never smoker, (deaf/ lip reading),  Mild intermittent asthma, allergi rhinitis complicated by DM2, resolved OSA,      Mother and signing interpreter here. ACUTE VISIT: Sore chest from cough-yellow, sinus drainage as well since Saturday. Nose is sore and red; drinking plenty of fluids to keep from getting dried out.  CXR 09/21/13 IMPRESSION: No active disease. Electronically Signed  By: Aletta Edouard M.D.  On: 09/21/2013 16:09   06/23/14- 62 yoF never smoker, (deaf/ lip reading),  Mild intermittent asthma, allergic rhinitis complicated by DM2, resolved OSA,     signing interpreter here. FOLLOWS FOR: Having dry nose with SOB-snoring. Still on vaccine and doing well Continues allergy vaccine at 1:50 GH. Allergy shots helpful. Got home air filter which helps. Indoor heat dry eyes or nose. This is increasing stuffiness and causing her to snore.  12/22/14- 22 yoF never smoker, (deaf/ lip reading),  Mild intermittent asthma, allergic rhinitis complicated by DM2, resolved OSA,     signing interpreter and mother here. FOLLOWS FOR: Pt continues allergy vaccine 1:50 and doing well.  Had a difficult spring with wheeze nasal congestion and cough. Not clear if this is all allergy or she had a viral infection during that time as well. Much better now. Feels well today. Reviewed medications.  06/26/2015-55 year old female never smoker (deaf/lip reading), mild intermittent asthma, allergic rhinitis complicated by DM 2, resolved OSA Signing interpreter here  06/29/2015-55 year old female never smoker (deaf/lip reading), mild intermittent asthma, allergic rhinitis complicated by DM 2, resolved OSA ACUTE VISIT: Pt would like to have depo and breathing tx; still coughing-white and thick and wheezing. Pt denies any fever or chills. Mother had called  requesting treatment which has helped Shaniquah in the past.  12/25/2015-55 year old female never smoker (deaf/lip reading/interpreter here), mild intermittent asthma, allergic rhinitis complicated by DM 2, resolved OSA Allergy vaccine 1:10 GH FOLLOW FOR:  eyes were dried out from pollen, using Systane eye drops twice weekly.  allergy shots are helping a lot.  She feels much better. Did well getting allergy vaccine every 2 weeks through the summer but plans to start back weekly in September. Using allergy eyedrops OTC. No asthma and no need for rescue inhaler recently. Sleeping well. Today is a good day.  ROS-see HPI Constitutional:   No-   weight loss, night sweats, fevers, chills,  fatigue, lassitude. HEENT:   No-  headaches, difficulty swallowing, tooth/dental problems, sore throat, She is deaf- reads lips and signs.     No- sneezing, itching, ear ache, +nasal congestion, post nasal drip,  CV:  No-   chest pain, orthopnea, PND, swelling in lower extremities, anasarca, dizziness, palpitations Resp:No- productive cough, + nonproductive cough Skin: No-   rash or lesions. GI:  No  heartburn, indigestion, abdominal pain, nausea, vomiting,  GU:  MS:  No-   joint pain or swelling.   Neuro-     nothing unusual Psych:  No- change in mood or affect. No depression or anxiety.  No memory loss.   OBJ- Physical Exam General- Alert, Oriented, Affect-appropriate, Distress- none acute. Looks well Skin- rash-none, lesions- none, excoriation- none Lymphadenopathy- none Head- atraumatic            Eyes- Gross vision intact, PERRLA, conjunctivae and secretions clear            Ears-  deaf            Nose- +mild turbinate edema, no-Septal dev, mucus, polyps, erosion, perforation             Throat- Mallampati II , mucosa-clear , drainage- none, tonsils- atrophic Neck- flexible , trachea midline, no stridor , thyroid nl, carotid no bruit Chest - symmetrical excursion , unlabored           Heart/CV- RRR , no  murmur , no gallop  , no rub, nl s1 s2                           - JVD- none , edema- none, stasis changes- none, varices- none           Lung-   wheeze-none rub- none           Chest wall-  Abd- Br/ Gen/ Rectal- Not done, not indicated Extrem- cyanosis- none, clubbing, none, atrophy- none, strength- nl Neuro- grossly intact to observation

## 2016-01-02 ENCOUNTER — Ambulatory Visit (INDEPENDENT_AMBULATORY_CARE_PROVIDER_SITE_OTHER): Payer: Medicare Other

## 2016-01-02 DIAGNOSIS — J309 Allergic rhinitis, unspecified: Secondary | ICD-10-CM

## 2016-01-03 DIAGNOSIS — M7062 Trochanteric bursitis, left hip: Secondary | ICD-10-CM | POA: Diagnosis not present

## 2016-01-03 DIAGNOSIS — M25552 Pain in left hip: Secondary | ICD-10-CM | POA: Diagnosis not present

## 2016-01-03 DIAGNOSIS — R220 Localized swelling, mass and lump, head: Secondary | ICD-10-CM | POA: Diagnosis not present

## 2016-01-16 ENCOUNTER — Ambulatory Visit (INDEPENDENT_AMBULATORY_CARE_PROVIDER_SITE_OTHER): Payer: Medicare Other | Admitting: *Deleted

## 2016-01-16 ENCOUNTER — Ambulatory Visit (INDEPENDENT_AMBULATORY_CARE_PROVIDER_SITE_OTHER): Payer: Medicare Other

## 2016-01-16 DIAGNOSIS — J309 Allergic rhinitis, unspecified: Secondary | ICD-10-CM | POA: Diagnosis not present

## 2016-01-16 DIAGNOSIS — Z23 Encounter for immunization: Secondary | ICD-10-CM | POA: Diagnosis not present

## 2016-01-18 DIAGNOSIS — D485 Neoplasm of uncertain behavior of skin: Secondary | ICD-10-CM | POA: Diagnosis not present

## 2016-01-18 DIAGNOSIS — L821 Other seborrheic keratosis: Secondary | ICD-10-CM | POA: Diagnosis not present

## 2016-01-19 ENCOUNTER — Telehealth: Payer: Self-pay | Admitting: Internal Medicine

## 2016-01-19 DIAGNOSIS — J309 Allergic rhinitis, unspecified: Secondary | ICD-10-CM | POA: Diagnosis not present

## 2016-01-19 NOTE — Telephone Encounter (Signed)
Allergy Serum Extract Date Mixed: 01/19/2016 Vial: AB Strength: 1:10 Here/Mail/Pick Up: Here Mixed By: Desmond Dike, CMA Last OV: 12/25/2015 Pending OV: 06/27/2016

## 2016-01-24 DIAGNOSIS — F200 Paranoid schizophrenia: Secondary | ICD-10-CM | POA: Diagnosis not present

## 2016-01-25 ENCOUNTER — Ambulatory Visit: Payer: Medicare Other | Admitting: Psychology

## 2016-01-28 ENCOUNTER — Other Ambulatory Visit: Payer: Self-pay | Admitting: Internal Medicine

## 2016-01-29 ENCOUNTER — Ambulatory Visit (INDEPENDENT_AMBULATORY_CARE_PROVIDER_SITE_OTHER): Payer: Medicare Other | Admitting: Internal Medicine

## 2016-01-29 ENCOUNTER — Encounter: Payer: Self-pay | Admitting: Internal Medicine

## 2016-01-29 DIAGNOSIS — M25552 Pain in left hip: Secondary | ICD-10-CM | POA: Diagnosis not present

## 2016-01-29 DIAGNOSIS — M25551 Pain in right hip: Secondary | ICD-10-CM | POA: Diagnosis not present

## 2016-01-29 MED ORDER — MELOXICAM 15 MG PO TABS: 15.0000 mg | ORAL_TABLET | Freq: Every day | ORAL | 2 refills | Status: DC

## 2016-01-29 MED ORDER — METHYLPREDNISOLONE ACETATE 80 MG/ML IJ SUSP
80.0000 mg | Freq: Once | INTRAMUSCULAR | Status: AC
  Administered 2016-01-29: 80 mg via INTRA_ARTICULAR

## 2016-01-29 NOTE — Patient Instructions (Signed)
[email or web address]: hip opener exercises   Postprocedure instructions :    A Band-Aid should be left on for 12 hours. Injection therapy is not a cure itself. It is used in conjunction with other modalities. You can use nonsteroidal anti-inflammatories like ibuprofen , hot and cold compresses. Rest is recommended in the next 24 hours. You need to report immediately  if fever, chills or any signs of infection develop.  

## 2016-01-29 NOTE — Progress Notes (Signed)
Pre visit review using our clinic review tool, if applicable. No additional management support is needed unless otherwise documented below in the visit note. 

## 2016-01-29 NOTE — Assessment & Plan Note (Signed)
Will inject °

## 2016-01-29 NOTE — Progress Notes (Signed)
Subjective:  Patient ID: Heidi Good, female    DOB: Mar 19, 1961  Age: 55 y.o. MRN: [id number]  CC: Hip Pain (Left x 2-3 weeks, can't sleep at night and is affecting her walking)   HPI Heidi Good presents for severe L hip pain x 2-3 weeks, not relieved w/other meds  Outpatient Medications Prior to Visit  Medication Sig Dispense Refill  . albuterol (PROVENTIL HFA;VENTOLIN HFA) 108 (90 BASE) MCG/ACT inhaler Inhale 2 puffs into the lungs every 6 (six) hours as needed for wheezing or shortness of breath. 1 Inhaler prn  . albuterol (PROVENTIL) (2.5 MG/3ML) 0.083% nebulizer solution Take 3 mLs (2.5 mg total) by nebulization every 6 (six) hours as needed for wheezing or shortness of breath. 75 mL 12  . ALPRAZolam (XANAX) 0.5 MG tablet Take 1 tablet (0.5 mg total) by mouth at bedtime as needed for anxiety. 30 tablet 0  . ARIPiprazole (ABILIFY) 15 MG tablet Take 15 mg by mouth daily.  0  . Cholecalciferol (VITAMIN D3) 2000 units capsule Take 1 capsule (2,000 Units total) by mouth daily. 100 capsule 3  . estradiol (ESTRACE) 1 MG tablet TAKE 1 TABLET EVERY DAY 90 tablet 1  . fluticasone (FLONASE) 50 MCG/ACT nasal spray Place 2 sprays into the nose daily. 16 g prn  . KLOR-CON 8 MEQ tablet TAKE 1 TABLET BY MOUTH EVERY DAY 90 tablet 1  . levothyroxine (SYNTHROID, LEVOTHROID) 75 MCG tablet Take 1 tablet (75 mcg total) by mouth daily. 90 tablet 3  . Linaclotide (LINZESS) 290 MCG CAPS capsule Take 1 capsule (290 mcg total) by mouth daily. 30 capsule 6  . meloxicam (MOBIC) 15 MG tablet TAKE 1 TABLET EVERY DAY AS NEEDED FOR PAIN 30 tablet 1  . mometasone-formoterol (DULERA) 100-5 MCG/ACT AERO 2 puffs then rinse mouth, twice daily - maintenance 1 Inhaler prn  . NITRO-BID 2 % ointment Place 1 application rectally. Mon, Wed, and Fri    . NONFORMULARY OR COMPOUNDED ITEM Allergy Vaccine 1:50 Given at [facility]    . traMADol (ULTRAM) 50 MG tablet Take 1-2 tablets (50-100 mg total) by mouth 2 (two)  times daily as needed for severe pain. 100 tablet 1   No facility-administered medications prior to visit.     ROS Review of Systems  Objective:  BP 130/62   Pulse 62   Wt 204 lb (92.5 kg)   SpO2 97%   BMI 35.02 kg/m   BP Readings from Last 3 Encounters:  01/29/16 130/62  12/25/15 118/60  11/03/15 130/70    Wt Readings from Last 3 Encounters:  01/29/16 204 lb (92.5 kg)  12/25/15 203 lb 6.4 oz (92.3 kg)  11/03/15 205 lb (93 kg)    Physical Exam    Procedure : Joint Injection,  L  hip   Indication:  Trochanteric bursitis with refractory  chronic pain.   Risks including unsuccessful procedure , bleeding, infection, bruising, skin atrophy, "steroid flare-up" and others were explained to the patient in detail as well as the benefits. Informed consent was obtained and signed.   Tthe patient was placed in a comfortable lateral decubitus position. The point of maximal tenderness was identified. Skin was prepped with Betadine and alcohol. Then, a 5 cc syringe with a 2 inch long 24-gauge needle was used for a bursa injection.. The needle was advanced  Into the bursa. I injected the bursa with 4 mL of 2% lidocaine and 80 mg of Depo-Medrol .  Band-Aid was applied.   Tolerated well.  Complications: None. Good pain relief following the procedure.   Postprocedure instructions :    A Band-Aid should be left on for 12 hours. Injection therapy is not a cure itself. It is used in conjunction with other modalities. You can use nonsteroidal anti-inflammatories like ibuprofen , hot and cold compresses. Rest is recommended in the next 24 hours. You need to report immediately  if fever, chills or any signs of infection develop.  Lab Results  Component Value Date   WBC 10.7 (H) 03/10/2015   HGB 15.2 (H) 03/10/2015   HCT 45.3 03/10/2015   PLT 207.0 03/10/2015   GLUCOSE 91 08/02/2015   CHOL 137 03/10/2015   TRIG 194.0 (H) 03/10/2015   HDL 43.10 03/10/2015   LDLCALC 55 03/10/2015   ALT 29  03/10/2015   AST 39 (H) 03/10/2015   NA 138 08/02/2015   K 3.4 (L) 08/02/2015   CL 100 08/02/2015   CREATININE 0.76 08/02/2015   BUN 8 08/02/2015   CO2 29 08/02/2015   TSH 0.84 08/02/2015   HGBA1C 6.1 08/02/2015    Mm Digital Screening Bilateral  Result Date: 03/01/2015 CLINICAL DATA:  Screening. EXAM: DIGITAL SCREENING BILATERAL MAMMOGRAM WITH CAD COMPARISON:  Previous exam(s). ACR Breast Density Category c: The breast tissue is heterogeneously dense, which may obscure small masses. FINDINGS: There are no findings suspicious for malignancy. Images were processed with CAD. IMPRESSION: No mammographic evidence of malignancy. A result letter of this screening mammogram will be mailed directly to the patient. RECOMMENDATION: Screening mammogram in one year. (Code:SM-B-01Y) BI-RADS CATEGORY  1: Negative. Electronically Signed   By: Margarette Canada M.D.   On: 03/01/2015 16:38    Assessment & Plan:   There are no diagnoses linked to this encounter. I am having Ms. Cardarelli maintain her fluticasone, albuterol, ALPRAZolam, albuterol, linaclotide, NITRO-BID, mometasone-formoterol, NONFORMULARY OR COMPOUNDED ITEM, ARIPiprazole, traMADol, Vitamin D3, meloxicam, levothyroxine, estradiol, and KLOR-CON.  No orders of the defined types were placed in this encounter.    Follow-up: No Follow-up on file.  Walker Kehr, MD

## 2016-01-30 ENCOUNTER — Ambulatory Visit (INDEPENDENT_AMBULATORY_CARE_PROVIDER_SITE_OTHER): Payer: Medicare Other | Admitting: *Deleted

## 2016-01-30 DIAGNOSIS — J309 Allergic rhinitis, unspecified: Secondary | ICD-10-CM

## 2016-01-31 ENCOUNTER — Other Ambulatory Visit: Payer: Self-pay | Admitting: Internal Medicine

## 2016-01-31 DIAGNOSIS — Z1231 Encounter for screening mammogram for malignant neoplasm of breast: Secondary | ICD-10-CM

## 2016-02-06 DIAGNOSIS — R220 Localized swelling, mass and lump, head: Secondary | ICD-10-CM | POA: Insufficient documentation

## 2016-02-13 ENCOUNTER — Ambulatory Visit (INDEPENDENT_AMBULATORY_CARE_PROVIDER_SITE_OTHER): Payer: Medicare Other

## 2016-02-13 ENCOUNTER — Ambulatory Visit (INDEPENDENT_AMBULATORY_CARE_PROVIDER_SITE_OTHER): Payer: Medicare Other | Admitting: Psychology

## 2016-02-13 DIAGNOSIS — F3181 Bipolar II disorder: Secondary | ICD-10-CM | POA: Diagnosis not present

## 2016-02-13 DIAGNOSIS — J309 Allergic rhinitis, unspecified: Secondary | ICD-10-CM

## 2016-02-23 ENCOUNTER — Telehealth: Payer: Self-pay | Admitting: Internal Medicine

## 2016-02-23 MED ORDER — PREDNISONE 10 MG PO TABS: 10.0000 mg | ORAL_TABLET | Freq: Every day | ORAL | 0 refills | Status: DC

## 2016-02-23 NOTE — Telephone Encounter (Addendum)
Called and spoke to pt's mother. Informed her of the recs per CY. Orders placed. Pt verbalized understanding and denied any further questions or concerns at this time.

## 2016-02-23 NOTE — Telephone Encounter (Signed)
Patient mother calling back - pr

## 2016-02-23 NOTE — Telephone Encounter (Signed)
Please remind to use nebulizer and inhalers as prescribed.  Offer prednisone 10 mg, #10, 1 daily

## 2016-02-23 NOTE — Telephone Encounter (Signed)
Having trouble with allergies, nose running, has dry cough, no chest tightness, sneezing, headaches.    Pharmacy: [facility] [place]  Allergies  Allergen Reactions  . Doxycycline Nausea And Vomiting  . Other     Dog, cat, nuts, grass, trees   Current Outpatient Prescriptions on File Prior to Visit  Medication Sig Dispense Refill  . albuterol (PROVENTIL HFA;VENTOLIN HFA) 108 (90 BASE) MCG/ACT inhaler Inhale 2 puffs into the lungs every 6 (six) hours as needed for wheezing or shortness of breath. 1 Inhaler prn  . albuterol (PROVENTIL) (2.5 MG/3ML) 0.083% nebulizer solution Take 3 mLs (2.5 mg total) by nebulization every 6 (six) hours as needed for wheezing or shortness of breath. 75 mL 12  . ALPRAZolam (XANAX) 0.5 MG tablet Take 1 tablet (0.5 mg total) by mouth at bedtime as needed for anxiety. 30 tablet 0  . ARIPiprazole (ABILIFY) 15 MG tablet Take 15 mg by mouth daily.  0  . Cholecalciferol (VITAMIN D3) 2000 units capsule Take 1 capsule (2,000 Units total) by mouth daily. 100 capsule 3  . estradiol (ESTRACE) 1 MG tablet TAKE 1 TABLET EVERY DAY 90 tablet 1  . fluticasone (FLONASE) 50 MCG/ACT nasal spray Place 2 sprays into the nose daily. 16 g prn  . KLOR-CON 8 MEQ tablet TAKE 1 TABLET BY MOUTH EVERY DAY 90 tablet 1  . levothyroxine (SYNTHROID, LEVOTHROID) 75 MCG tablet Take 1 tablet (75 mcg total) by mouth daily. 90 tablet 3  . Linaclotide (LINZESS) 290 MCG CAPS capsule Take 1 capsule (290 mcg total) by mouth daily. 30 capsule 6  . meloxicam (MOBIC) 15 MG tablet Take 1 tablet (15 mg total) by mouth daily. 30 tablet 2  . mometasone-formoterol (DULERA) 100-5 MCG/ACT AERO 2 puffs then rinse mouth, twice daily - maintenance 1 Inhaler prn  . NITRO-BID 2 % ointment Place 1 application rectally. Mon, Wed, and Fri    . NONFORMULARY OR COMPOUNDED ITEM Allergy Vaccine 1:50 Given at [facility]    . traMADol (ULTRAM) 50 MG tablet Take 1-2 tablets (50-100 mg total) by mouth 2 (two) times daily  as needed for severe pain. 100 tablet 1   No current facility-administered medications on file prior to visit.

## 2016-02-26 ENCOUNTER — Telehealth: Payer: Self-pay | Admitting: Internal Medicine

## 2016-02-26 NOTE — Telephone Encounter (Signed)
Attempted to contact patients mom, line busy, will call back.

## 2016-02-26 NOTE — Telephone Encounter (Signed)
Please cal her and suggest she skip this week and return next week.

## 2016-02-26 NOTE — Telephone Encounter (Signed)
Patient's mom calling, she wants to know if patient should come in for allergy shot today, she is still not feeling well.  She is still coughing up clear mucus, using nebulizer and taking Prednisone.  No fever.  Mom also wants to know when she can come back for her allergy shots if she cannot get it today?   Dr. Annamaria Boots, please advise.

## 2016-02-27 ENCOUNTER — Ambulatory Visit: Payer: Medicare Other

## 2016-02-27 NOTE — Telephone Encounter (Signed)
Called and spoke with pt.'s Mother, Larsyn is still sick. I gave her CY's recs., she is going to call and make an appt. For her next Tues..Nothing further needed,closing.

## 2016-03-01 ENCOUNTER — Ambulatory Visit (INDEPENDENT_AMBULATORY_CARE_PROVIDER_SITE_OTHER): Payer: Medicare Other | Admitting: Internal Medicine

## 2016-03-01 ENCOUNTER — Encounter: Payer: Self-pay | Admitting: Internal Medicine

## 2016-03-01 ENCOUNTER — Ambulatory Visit
Admission: RE | Admit: 2016-03-01 | Discharge: 2016-03-01 | Disposition: A | Discharge status: Home / Self Care | Payer: Medicare Other | Source: Ambulatory Visit | Attending: Internal Medicine | Admitting: Internal Medicine

## 2016-03-01 VITALS — BP 142/86 | HR 76 | Temp 97.9°F | Ht 64.0 in | Wt 209.0 lb

## 2016-03-01 DIAGNOSIS — J4521 Mild intermittent asthma with (acute) exacerbation: Secondary | ICD-10-CM

## 2016-03-01 DIAGNOSIS — Z1231 Encounter for screening mammogram for malignant neoplasm of breast: Secondary | ICD-10-CM

## 2016-03-01 DIAGNOSIS — J45901 Unspecified asthma with (acute) exacerbation: Secondary | ICD-10-CM | POA: Diagnosis not present

## 2016-03-01 MED ORDER — METHYLPREDNISOLONE ACETATE 80 MG/ML IJ SUSP
80.0000 mg | Freq: Once | INTRAMUSCULAR | Status: AC
  Administered 2016-03-01: 80 mg via INTRAMUSCULAR

## 2016-03-01 MED ORDER — AZITHROMYCIN 250 MG PO TABS: ORAL_TABLET | ORAL | 0 refills | Status: DC

## 2016-03-01 MED ORDER — HYDROCOD POLST-CPM POLST ER 10-8 MG/5ML PO SUER: ORAL | 0 refills | Status: DC

## 2016-03-01 MED ORDER — LEVALBUTEROL HCL 0.63 MG/3ML IN NEBU
0.6300 mg | INHALATION_SOLUTION | Freq: Once | RESPIRATORY_TRACT | Status: AC
  Administered 2016-03-01: 0.63 mg via RESPIRATORY_TRACT

## 2016-03-01 NOTE — Progress Notes (Signed)
HPI  F never smoker, (deaf/lip reading), mild intermittent asthma, allergic rhinitis complicated by DM 2, resolved OSA       Deaf since meningitis as an infant,     12/25/2015-55 year old female never smoker (deaf/lip reading/interpreter here), mild intermittent asthma, allergic rhinitis complicated by DM 2, resolved OSA Allergy vaccine 1:10 GH FOLLOW FOR:  eyes were dried out from pollen, using Systane eye drops twice weekly.  allergy shots are helping a lot.  She feels much better. Did well getting allergy vaccine every 2 weeks through the summer but plans to start back weekly in September. Using allergy eyedrops OTC. No asthma and no need for rescue inhaler recently. Sleeping well. Today is a good day.  03/01/2016-55 year old female never smoker (deaf/lip reading/interpreter) mild intermittent asthma, allergic rhinitis, complicated by DM 2, resolved OSA Allergy vaccine 1:10 [place]     signing interpreter here FOLLOWS FOR: Pt c/o cough x 1 week with runny nose.  pt states that mucus is clear. c/o SOB and chest tightness.  Feels warm-maybe low fever but no chills. No GI upset. OTC symptomatic therapy not helping.  ROS-see HPI Constitutional:   No-   weight loss, night sweats, fevers, chills,  fatigue, lassitude. HEENT:   No-  headaches, difficulty swallowing, tooth/dental problems, sore throat, She is deaf- reads lips and signs.     No- sneezing, itching, ear ache, +nasal congestion, post nasal drip,  CV:  No-   chest pain, orthopnea, PND, swelling in lower extremities, anasarca, dizziness, palpitations Resp:+ productive cough, + nonproductive cough Skin: No-   rash or lesions. GI:  No  heartburn, indigestion, abdominal pain, nausea, vomiting,  GU:  MS:  No-   joint pain or swelling.   Neuro-     nothing unusual Psych:  No- change in mood or affect. No depression or anxiety.  No memory loss.   OBJ- Physical Exam General- Alert, Oriented, Affect-appropriate, Distress- none acute. +  Overweight Skin- rash-none, lesions- none, excoriation- none Lymphadenopathy- none Head- atraumatic            Eyes- Gross vision intact, PERRLA, conjunctivae and secretions clear            Ears- deaf            Nose- +mild turbinate edema, no-Septal dev, mucus, polyps, erosion, perforation             Throat- Mallampati II , mucosa-clear , drainage- none, tonsils- atrophic Neck- flexible , trachea midline, no stridor , thyroid nl, carotid no bruit Chest - symmetrical excursion , unlabored           Heart/CV- RRR , no murmur , no gallop  , no rub, nl s1 s2                           - JVD- none , edema- none, stasis changes- none, varices- none           Lung-   Wheeze+, rub- none, cough-+ dry raspy           Chest wall-  Abd- Br/ Gen/ Rectal- Not done, not indicated Extrem- cyanosis- none, clubbing, none, atrophy- none, strength- nl Neuro- grossly intact to observation

## 2016-03-01 NOTE — Assessment & Plan Note (Signed)
Acute exacerbation. We discussed potential triggers. Plan-nebulizer treatments Xopenex, Depo-Medrol. Tussionex. Z-Pak prescription to hold as discussed.

## 2016-03-01 NOTE — Patient Instructions (Addendum)
Script printed for Zpak antibiotic to hold  Neb xop 0.63    Dx acute exacerbation asthmatic  Bronchitis  Depo 80  Script printed for tussionex cough syrup  Please call as needed

## 2016-03-05 ENCOUNTER — Ambulatory Visit (INDEPENDENT_AMBULATORY_CARE_PROVIDER_SITE_OTHER): Payer: Medicare Other | Admitting: *Deleted

## 2016-03-05 DIAGNOSIS — J309 Allergic rhinitis, unspecified: Secondary | ICD-10-CM | POA: Diagnosis not present

## 2016-03-06 ENCOUNTER — Other Ambulatory Visit (INDEPENDENT_AMBULATORY_CARE_PROVIDER_SITE_OTHER): Payer: Medicare Other

## 2016-03-06 ENCOUNTER — Encounter: Payer: Self-pay | Admitting: Internal Medicine

## 2016-03-06 ENCOUNTER — Ambulatory Visit (INDEPENDENT_AMBULATORY_CARE_PROVIDER_SITE_OTHER): Payer: Medicare Other | Admitting: Internal Medicine

## 2016-03-06 DIAGNOSIS — J4521 Mild intermittent asthma with (acute) exacerbation: Secondary | ICD-10-CM | POA: Diagnosis not present

## 2016-03-06 DIAGNOSIS — Z23 Encounter for immunization: Secondary | ICD-10-CM

## 2016-03-06 DIAGNOSIS — F321 Major depressive disorder, single episode, moderate: Secondary | ICD-10-CM

## 2016-03-06 DIAGNOSIS — H903 Sensorineural hearing loss, bilateral: Secondary | ICD-10-CM

## 2016-03-06 DIAGNOSIS — K5900 Constipation, unspecified: Secondary | ICD-10-CM | POA: Diagnosis not present

## 2016-03-06 DIAGNOSIS — G4733 Obstructive sleep apnea (adult) (pediatric): Secondary | ICD-10-CM | POA: Diagnosis not present

## 2016-03-06 LAB — BASIC METABOLIC PANEL
BUN: 8 mg/dL (ref 6–23)
CALCIUM: 9.2 mg/dL (ref 8.4–10.5)
CO2: 31 meq/L (ref 19–32)
CREATININE: 0.82 mg/dL (ref 0.40–1.20)
Chloride: 98 mEq/L (ref 96–112)
GFR: 77.01 mL/min (ref >60.00)
GLUCOSE: 97 mg/dL (ref 70–99)
Potassium: 3.8 mEq/L (ref 3.5–5.1)
Sodium: 137 mEq/L (ref 135–145)

## 2016-03-06 MED ORDER — VITAMIN D3 50 MCG (2000 UT) PO CAPS: 2000.0000 [IU] | ORAL_CAPSULE | Freq: Every day | ORAL | 3 refills | Status: DC

## 2016-03-06 MED ORDER — MOMETASONE FURO-FORMOTEROL FUM 100-5 MCG/ACT IN AERO: INHALATION_SPRAY | RESPIRATORY_TRACT | 11 refills | Status: DC

## 2016-03-06 MED ORDER — ALBUTEROL SULFATE HFA 108 (90 BASE) MCG/ACT IN AERS: 1.0000 | INHALATION_SPRAY | Freq: Four times a day (QID) | RESPIRATORY_TRACT | 5 refills | Status: DC | PRN

## 2016-03-06 NOTE — Addendum Note (Signed)
Addended by: Cresenciano Lick on: 03/06/2016 04:31 PM   Modules accepted: Orders

## 2016-03-06 NOTE — Progress Notes (Signed)
Pre visit review using our clinic review tool, if applicable. No additional management support is needed unless otherwise documented below in the visit note. 

## 2016-03-06 NOTE — Assessment & Plan Note (Signed)
Linzess 

## 2016-03-06 NOTE — Assessment & Plan Note (Signed)
Abilify

## 2016-03-06 NOTE — Progress Notes (Signed)
Subjective:  Patient ID: Heidi Good, female    DOB: 11-Sep-1960  Age: 55 y.o. MRN: [id number]  CC: No chief complaint on file.   HPI Heidi Good presents for asthma, anxiety, depression f/u. Just took Prednisone for cough...  Outpatient Medications Prior to Visit  Medication Sig Dispense Refill  . albuterol (PROVENTIL HFA;VENTOLIN HFA) 108 (90 BASE) MCG/ACT inhaler Inhale 2 puffs into the lungs every 6 (six) hours as needed for wheezing or shortness of breath. 1 Inhaler prn  . albuterol (PROVENTIL) (2.5 MG/3ML) 0.083% nebulizer solution Take 3 mLs (2.5 mg total) by nebulization every 6 (six) hours as needed for wheezing or shortness of breath. 75 mL 12  . ALPRAZolam (XANAX) 0.5 MG tablet Take 1 tablet (0.5 mg total) by mouth at bedtime as needed for anxiety. 30 tablet 0  . ARIPiprazole (ABILIFY) 15 MG tablet Take 15 mg by mouth daily.  0  . estradiol (ESTRACE) 1 MG tablet TAKE 1 TABLET EVERY DAY 90 tablet 1  . fluticasone (FLONASE) 50 MCG/ACT nasal spray Place 2 sprays into the nose daily. 16 g prn  . KLOR-CON 8 MEQ tablet TAKE 1 TABLET BY MOUTH EVERY DAY 90 tablet 1  . levothyroxine (SYNTHROID, LEVOTHROID) 75 MCG tablet Take 1 tablet (75 mcg total) by mouth daily. 90 tablet 3  . Linaclotide (LINZESS) 290 MCG CAPS capsule Take 1 capsule (290 mcg total) by mouth daily. 30 capsule 6  . NITRO-BID 2 % ointment Place 1 application rectally. Mon, Wed, and Fri    . NONFORMULARY OR COMPOUNDED ITEM Allergy Vaccine 1:50 Given at [facility]    . traMADol (ULTRAM) 50 MG tablet Take 1-2 tablets (50-100 mg total) by mouth 2 (two) times daily as needed for severe pain. 100 tablet 1  . azithromycin (ZITHROMAX) 250 MG tablet Take 2 tabs today then one daily until gone. 6 tablet 0  . chlorpheniramine-HYDROcodone (TUSSIONEX PENNKINETIC ER) 10-8 MG/5ML SUER 1 teaspoon every 12 hours as needed 140 mL 0  . Cholecalciferol (VITAMIN D3) 2000 units capsule Take 1 capsule (2,000 Units total) by  mouth daily. 100 capsule 3  . mometasone-formoterol (DULERA) 100-5 MCG/ACT AERO 2 puffs then rinse mouth, twice daily - maintenance 1 Inhaler prn  . predniSONE (DELTASONE) 10 MG tablet Take 1 tablet (10 mg total) by mouth daily with breakfast. 10 tablet 0   No facility-administered medications prior to visit.     ROS Review of Systems  Constitutional: Negative for activity change, appetite change, chills, fatigue and unexpected weight change.  HENT: Positive for hearing loss. Negative for congestion, mouth sores and sinus pressure.   Eyes: Negative for visual disturbance.  Respiratory: Positive for cough. Negative for chest tightness.   Gastrointestinal: Negative for abdominal pain and nausea.  Genitourinary: Negative for difficulty urinating, frequency and vaginal pain.  Musculoskeletal: Negative for back pain and gait problem.  Skin: Negative for pallor and rash.  Neurological: Negative for dizziness, tremors, weakness, numbness and headaches.  Psychiatric/Behavioral: Negative for confusion and sleep disturbance.  Obese Using an interpreter   Objective:  BP 130/70   Pulse 82   Wt 208 lb (94.3 kg)   SpO2 96%   BMI 35.70 kg/m   BP Readings from Last 3 Encounters:  03/06/16 130/70  03/01/16 (!) 142/86  01/29/16 130/62    Wt Readings from Last 3 Encounters:  03/06/16 208 lb (94.3 kg)  03/01/16 209 lb (94.8 kg)  01/29/16 204 lb (92.5 kg)    Physical Exam  Constitutional:  She appears well-developed. No distress.  HENT:  Head: Normocephalic.  Right Ear: External ear normal.  Left Ear: External ear normal.  Nose: Nose normal.  Mouth/Throat: Oropharynx is clear and moist.  Eyes: Conjunctivae are normal. Pupils are equal, round, and reactive to light. Right eye exhibits no discharge. Left eye exhibits no discharge.  Neck: Normal range of motion. Neck supple. No JVD present. No tracheal deviation present. No thyromegaly present.  Cardiovascular: Normal rate, regular rhythm  and normal heart sounds.   Pulmonary/Chest: No stridor. No respiratory distress. She has no wheezes.  Abdominal: Soft. Bowel sounds are normal. She exhibits no distension and no mass. There is no tenderness. There is no rebound and no guarding.  Musculoskeletal: She exhibits no edema or tenderness.  Lymphadenopathy:    She has no cervical adenopathy.  Neurological: She displays normal reflexes. No cranial nerve deficit. She exhibits normal muscle tone. Coordination normal.  Skin: No rash noted. No erythema.  Psychiatric: She has a normal mood and affect. Her behavior is normal. Judgment and thought content normal.    Lab Results  Component Value Date   WBC 10.7 (H) 03/10/2015   HGB 15.2 (H) 03/10/2015   HCT 45.3 03/10/2015   PLT 207.0 03/10/2015   GLUCOSE 91 08/02/2015   CHOL 137 03/10/2015   TRIG 194.0 (H) 03/10/2015   HDL 43.10 03/10/2015   LDLCALC 55 03/10/2015   ALT 29 03/10/2015   AST 39 (H) 03/10/2015   NA 138 08/02/2015   K 3.4 (L) 08/02/2015   CL 100 08/02/2015   CREATININE 0.76 08/02/2015   BUN 8 08/02/2015   CO2 29 08/02/2015   TSH 0.84 08/02/2015   HGBA1C 6.1 08/02/2015    Mm Screening Breast Tomo Bilateral  Result Date: 03/04/2016 CLINICAL DATA:  Screening. EXAM: 2D DIGITAL SCREENING BILATERAL MAMMOGRAM WITH CAD AND ADJUNCT TOMO COMPARISON:  Previous exam(s). ACR Breast Density Category c: The breast tissue is heterogeneously dense, which may obscure small masses. FINDINGS: There are no findings suspicious for malignancy. Images were processed with CAD. IMPRESSION: No mammographic evidence of malignancy. A result letter of this screening mammogram will be mailed directly to the patient. RECOMMENDATION: Screening mammogram in one year. (Code:SM-B-01Y) BI-RADS CATEGORY  1: Negative. Electronically Signed   By: Evangeline Dakin M.D.   On: 03/04/2016 14:59    Assessment & Plan:   There are no diagnoses linked to this encounter. I have discontinued Ms. Severtson's  predniSONE, chlorpheniramine-HYDROcodone, and azithromycin. I am also having her maintain her fluticasone, albuterol, ALPRAZolam, albuterol, linaclotide, NITRO-BID, NONFORMULARY OR COMPOUNDED ITEM, ARIPiprazole, traMADol, levothyroxine, estradiol, KLOR-CON, aspirin EC, Vitamin D3, and mometasone-formoterol.  Meds ordered this encounter  Medications  . aspirin EC 81 MG tablet    Sig: Take 81 mg by mouth daily.  . Cholecalciferol (VITAMIN D3) 2000 units capsule    Sig: Take 1 capsule (2,000 Units total) by mouth daily.    Dispense:  100 capsule    Refill:  3  . mometasone-formoterol (DULERA) 100-5 MCG/ACT AERO    Sig: 2 puffs then rinse mouth, twice daily - maintenance    Dispense:  1 Inhaler    Refill:  11     Follow-up: No Follow-up on file.  Walker Kehr, MD

## 2016-03-06 NOTE — Assessment & Plan Note (Signed)
Dulera MDI  Labs

## 2016-03-06 NOTE — Assessment & Plan Note (Addendum)
F/u w/Dr Sood 

## 2016-03-06 NOTE — Assessment & Plan Note (Signed)
Chronic Using an interpreter

## 2016-03-12 ENCOUNTER — Ambulatory Visit (INDEPENDENT_AMBULATORY_CARE_PROVIDER_SITE_OTHER): Payer: Medicare Other | Admitting: *Deleted

## 2016-03-12 DIAGNOSIS — J309 Allergic rhinitis, unspecified: Secondary | ICD-10-CM | POA: Diagnosis not present

## 2016-03-19 ENCOUNTER — Ambulatory Visit (INDEPENDENT_AMBULATORY_CARE_PROVIDER_SITE_OTHER): Payer: Medicare Other

## 2016-03-19 DIAGNOSIS — J309 Allergic rhinitis, unspecified: Secondary | ICD-10-CM

## 2016-03-26 ENCOUNTER — Ambulatory Visit (INDEPENDENT_AMBULATORY_CARE_PROVIDER_SITE_OTHER): Payer: Medicare Other | Admitting: *Deleted

## 2016-03-26 DIAGNOSIS — J309 Allergic rhinitis, unspecified: Secondary | ICD-10-CM

## 2016-04-02 ENCOUNTER — Ambulatory Visit (INDEPENDENT_AMBULATORY_CARE_PROVIDER_SITE_OTHER): Payer: Medicare Other | Admitting: *Deleted

## 2016-04-02 DIAGNOSIS — J309 Allergic rhinitis, unspecified: Secondary | ICD-10-CM

## 2016-04-03 ENCOUNTER — Ambulatory Visit (INDEPENDENT_AMBULATORY_CARE_PROVIDER_SITE_OTHER)
Admission: RE | Admit: 2016-04-03 | Discharge: 2016-04-03 | Disposition: A | Discharge status: Home / Self Care | Payer: Medicare Other | Source: Ambulatory Visit | Attending: Internal Medicine | Admitting: Internal Medicine

## 2016-04-03 ENCOUNTER — Ambulatory Visit (INDEPENDENT_AMBULATORY_CARE_PROVIDER_SITE_OTHER): Payer: Medicare Other | Admitting: Internal Medicine

## 2016-04-03 ENCOUNTER — Encounter: Payer: Self-pay | Admitting: Internal Medicine

## 2016-04-03 DIAGNOSIS — J4521 Mild intermittent asthma with (acute) exacerbation: Secondary | ICD-10-CM

## 2016-04-03 DIAGNOSIS — K219 Gastro-esophageal reflux disease without esophagitis: Secondary | ICD-10-CM | POA: Diagnosis not present

## 2016-04-03 DIAGNOSIS — R05 Cough: Secondary | ICD-10-CM | POA: Diagnosis not present

## 2016-04-03 MED ORDER — PREDNISONE 10 MG PO TABS: ORAL_TABLET | ORAL | 1 refills | Status: DC

## 2016-04-03 MED ORDER — MOMETASONE FURO-FORMOTEROL FUM 100-5 MCG/ACT IN AERO: INHALATION_SPRAY | RESPIRATORY_TRACT | 11 refills | Status: DC

## 2016-04-03 MED ORDER — PANTOPRAZOLE SODIUM 40 MG PO TBEC: 40.0000 mg | DELAYED_RELEASE_TABLET | Freq: Every day | ORAL | 11 refills | Status: DC

## 2016-04-03 NOTE — Assessment & Plan Note (Signed)
Worse Use Dulera on schedule Prednisone 10 mg: take 4 tabs a day x 3 days; then 3 tabs a day x 4 days; then 2 tabs a day x 4 days, then 1 tab a day x 6 days, then stop. Take pc. CXR

## 2016-04-03 NOTE — Progress Notes (Signed)
Subjective:  Patient ID: Heidi Good, female    DOB: 1960/08/28  Age: 55 y.o. MRN: [id number]  CC: Cough (was worse when using Rx cough med. Has been using Delsym with more relief)   Cough  This is a new problem. The current episode started 1 to 4 weeks ago. The problem occurs hourly. The cough is productive of sputum. Associated symptoms include chills. Pertinent negatives include no headaches or rash. She has tried OTC cough suppressant for the symptoms. The treatment provided mild relief. Her past medical history is significant for asthma.   Heidi Good presents for cogh  Outpatient Medications Prior to Visit  Medication Sig Dispense Refill  . albuterol (PROVENTIL HFA;VENTOLIN HFA) 108 (90 Base) MCG/ACT inhaler Inhale 1-2 puffs into the lungs every 6 (six) hours as needed for wheezing or shortness of breath. 1 Inhaler 5  . albuterol (PROVENTIL) (2.5 MG/3ML) 0.083% nebulizer solution Take 3 mLs (2.5 mg total) by nebulization every 6 (six) hours as needed for wheezing or shortness of breath. 75 mL 12  . ALPRAZolam (XANAX) 0.5 MG tablet Take 1 tablet (0.5 mg total) by mouth at bedtime as needed for anxiety. 30 tablet 0  . ARIPiprazole (ABILIFY) 15 MG tablet Take 15 mg by mouth daily.  0  . Cholecalciferol (VITAMIN D3) 2000 units capsule Take 1 capsule (2,000 Units total) by mouth daily. 100 capsule 3  . estradiol (ESTRACE) 1 MG tablet TAKE 1 TABLET EVERY DAY 90 tablet 1  . fluticasone (FLONASE) 50 MCG/ACT nasal spray Place 2 sprays into the nose daily. 16 g prn  . KLOR-CON 8 MEQ tablet TAKE 1 TABLET BY MOUTH EVERY DAY 90 tablet 1  . levothyroxine (SYNTHROID, LEVOTHROID) 75 MCG tablet Take 1 tablet (75 mcg total) by mouth daily. 90 tablet 3  . Linaclotide (LINZESS) 290 MCG CAPS capsule Take 1 capsule (290 mcg total) by mouth daily. 30 capsule 6  . mometasone-formoterol (DULERA) 100-5 MCG/ACT AERO 2 puffs then rinse mouth, twice daily - maintenance 1 Inhaler 11  . NITRO-BID 2 %  ointment Place 1 application rectally. Mon, Wed, and Fri    . NONFORMULARY OR COMPOUNDED ITEM Allergy Vaccine 1:50 Given at [facility]    . aspirin EC 81 MG tablet Take 81 mg by mouth daily.    . traMADol (ULTRAM) 50 MG tablet Take 1-2 tablets (50-100 mg total) by mouth 2 (two) times daily as needed for severe pain. (Patient not taking: Reported on 04/03/2016) 100 tablet 1   No facility-administered medications prior to visit.     ROS Review of Systems  Constitutional: Positive for chills and diaphoresis. Negative for activity change, appetite change, fatigue and unexpected weight change.  HENT: Negative for congestion, mouth sores and sinus pressure.   Eyes: Negative for visual disturbance.  Respiratory: Positive for cough. Negative for chest tightness.   Gastrointestinal: Positive for nausea. Negative for abdominal pain.  Genitourinary: Negative for difficulty urinating, frequency and vaginal pain.  Musculoskeletal: Negative for back pain and gait problem.  Skin: Negative for pallor and rash.  Neurological: Negative for dizziness, tremors, weakness, numbness and headaches.  Psychiatric/Behavioral: Negative for confusion and sleep disturbance.    Objective:  BP 122/74   Pulse 74   Temp 98.8 F (37.1 C) (Oral)   Wt 207 lb (93.9 kg)   SpO2 98%   BMI 35.53 kg/m   BP Readings from Last 3 Encounters:  04/03/16 122/74  03/06/16 130/70  03/01/16 (!) 142/86    Wt Readings  from Last 3 Encounters:  04/03/16 207 lb (93.9 kg)  03/06/16 208 lb (94.3 kg)  03/01/16 209 lb (94.8 kg)    Physical Exam  Constitutional: She appears well-developed. No distress.  HENT:  Head: Normocephalic.  Right Ear: External ear normal.  Left Ear: External ear normal.  Nose: Nose normal.  Mouth/Throat: Oropharynx is clear and moist.  Eyes: Conjunctivae are normal. Pupils are equal, round, and reactive to light. Right eye exhibits no discharge. Left eye exhibits no discharge.  Neck: Normal  range of motion. Neck supple. No JVD present. No tracheal deviation present. No thyromegaly present.  Cardiovascular: Normal rate, regular rhythm and normal heart sounds.   Pulmonary/Chest: No stridor. No respiratory distress. She has no wheezes.  Abdominal: Soft. Bowel sounds are normal. She exhibits no distension and no mass. There is no tenderness. There is no rebound and no guarding.  Musculoskeletal: She exhibits no edema or tenderness.  Lymphadenopathy:    She has no cervical adenopathy.  Neurological: She displays normal reflexes. No cranial nerve deficit. She exhibits normal muscle tone. Coordination normal.  Skin: No rash noted. No erythema.  Psychiatric: She has a normal mood and affect. Her behavior is normal. Judgment and thought content normal.    Lab Results  Component Value Date   WBC 10.7 (H) 03/10/2015   HGB 15.2 (H) 03/10/2015   HCT 45.3 03/10/2015   PLT 207.0 03/10/2015   GLUCOSE 97 03/06/2016   CHOL 137 03/10/2015   TRIG 194.0 (H) 03/10/2015   HDL 43.10 03/10/2015   LDLCALC 55 03/10/2015   ALT 29 03/10/2015   AST 39 (H) 03/10/2015   NA 137 03/06/2016   K 3.8 03/06/2016   CL 98 03/06/2016   CREATININE 0.82 03/06/2016   BUN 8 03/06/2016   CO2 31 03/06/2016   TSH 0.84 08/02/2015   HGBA1C 6.1 08/02/2015    Mm Screening Breast Tomo Bilateral  Result Date: 03/04/2016 CLINICAL DATA:  Screening. EXAM: 2D DIGITAL SCREENING BILATERAL MAMMOGRAM WITH CAD AND ADJUNCT TOMO COMPARISON:  Previous exam(s). ACR Breast Density Category c: The breast tissue is heterogeneously dense, which may obscure small masses. FINDINGS: There are no findings suspicious for malignancy. Images were processed with CAD. IMPRESSION: No mammographic evidence of malignancy. A result letter of this screening mammogram will be mailed directly to the patient. RECOMMENDATION: Screening mammogram in one year. (Code:SM-B-01Y) BI-RADS CATEGORY  1: Negative. Electronically Signed   By: Evangeline Dakin  M.D.   On: 03/04/2016 14:59    Assessment & Plan:   There are no diagnoses linked to this encounter. I am having Heidi Good maintain her fluticasone, albuterol, ALPRAZolam, linaclotide, NITRO-BID, NONFORMULARY OR COMPOUNDED ITEM, ARIPiprazole, traMADol, levothyroxine, estradiol, KLOR-CON, aspirin EC, Vitamin D3, mometasone-formoterol, and albuterol.  No orders of the defined types were placed in this encounter.    Follow-up: No Follow-up on file.  Walker Kehr, MD

## 2016-04-03 NOTE — Patient Instructions (Signed)
Use over-the-counter  "cold" medicines  such as "Tylenol cold" , "Advil cold",  "Mucinex" or" Mucinex D"  for cough and congestion.   Avoid decongestants if you have high blood pressure and use "Afrin" nasal spray for nasal congestion as directed instead. Use" Delsym" or" Robitussin" cough syrup varietis for cough.  You can use plain "Tylenol" or "Advil" for fever, chills and achyness. Use Halls or Ricola cough drops.  Please, make an appointment if you are not better or if you're worse.  

## 2016-04-03 NOTE — Assessment & Plan Note (Signed)
Worse Start Protonix CXR

## 2016-04-03 NOTE — Progress Notes (Signed)
Pre visit review using our clinic review tool, if applicable. No additional management support is needed unless otherwise documented below in the visit note. 

## 2016-04-08 ENCOUNTER — Other Ambulatory Visit: Payer: Self-pay | Admitting: Internal Medicine

## 2016-04-08 DIAGNOSIS — R9389 Abnormal findings on diagnostic imaging of other specified body structures: Secondary | ICD-10-CM

## 2016-04-09 ENCOUNTER — Ambulatory Visit (INDEPENDENT_AMBULATORY_CARE_PROVIDER_SITE_OTHER): Payer: Medicare Other | Admitting: *Deleted

## 2016-04-09 ENCOUNTER — Telehealth: Payer: Self-pay | Admitting: Internal Medicine

## 2016-04-09 DIAGNOSIS — J309 Allergic rhinitis, unspecified: Secondary | ICD-10-CM | POA: Diagnosis not present

## 2016-04-09 NOTE — Telephone Encounter (Signed)
Spoke to pt's mother and she states pt is still coughing pretty bad and is wondering what else can be done.

## 2016-04-09 NOTE — Telephone Encounter (Signed)
We are getting a chest CT. Is it scheduled? Thx

## 2016-04-10 ENCOUNTER — Inpatient Hospital Stay: Admission: RE | Admit: 2016-04-10 | Payer: Self-pay | Source: Ambulatory Visit

## 2016-04-10 NOTE — Telephone Encounter (Signed)
Per [facility]- pt missed 04/09/16 CT scan appt. Pt is now rescheduled for 04/11/16. Pt informed by Erline Levine @ [place] CT dept.

## 2016-04-11 ENCOUNTER — Ambulatory Visit (INDEPENDENT_AMBULATORY_CARE_PROVIDER_SITE_OTHER)
Admission: RE | Admit: 2016-04-11 | Discharge: 2016-04-11 | Disposition: A | Discharge status: Home / Self Care | Payer: Medicare Other | Source: Ambulatory Visit | Attending: Internal Medicine | Admitting: Internal Medicine

## 2016-04-11 DIAGNOSIS — R938 Abnormal findings on diagnostic imaging of other specified body structures: Secondary | ICD-10-CM | POA: Diagnosis not present

## 2016-04-11 DIAGNOSIS — R918 Other nonspecific abnormal finding of lung field: Secondary | ICD-10-CM | POA: Diagnosis not present

## 2016-04-11 DIAGNOSIS — R9389 Abnormal findings on diagnostic imaging of other specified body structures: Secondary | ICD-10-CM

## 2016-04-11 MED ORDER — IOPAMIDOL (ISOVUE-300) INJECTION 61%
80.0000 mL | Freq: Once | INTRAVENOUS | Status: AC | PRN
  Administered 2016-04-11: 80 mL via INTRAVENOUS

## 2016-04-12 ENCOUNTER — Telehealth: Payer: Self-pay | Admitting: Internal Medicine

## 2016-04-12 NOTE — Telephone Encounter (Signed)
Notes Recorded by Cresenciano Lick, CMA on 04/12/2016 at 5:37 PM EST Pt's mother informed by Alvira Monday., [facility] scheduler. ------  Notes Recorded by Cassandria Anger, MD on 04/11/2016 at 9:28 PM EST Erline Levine,  Please inform patient that her chest CT is ok - no tumor. The area of concern on the CXR was an arthritic vertebra. Thx

## 2016-04-12 NOTE — Telephone Encounter (Signed)
Mother called.  Gave MD response on CT

## 2016-04-15 ENCOUNTER — Other Ambulatory Visit: Payer: Self-pay | Admitting: Internal Medicine

## 2016-04-16 ENCOUNTER — Ambulatory Visit: Payer: Self-pay

## 2016-04-17 ENCOUNTER — Telehealth: Payer: Self-pay | Admitting: Pulmonary Disease

## 2016-04-17 NOTE — Telephone Encounter (Signed)
Spoke with Carol-mother to patient; pt is deaf. Pt can not be here on Thursday or Friday this week since her car is being worked on. Arbie Cookey states patient can be here on Monday-I have added her to CY's schedule at 11:30am Monday 04/22/16. Nothing more needed at this time.

## 2016-04-18 ENCOUNTER — Ambulatory Visit: Payer: Medicare Other | Admitting: Psychology

## 2016-04-22 ENCOUNTER — Ambulatory Visit: Payer: Self-pay | Admitting: Internal Medicine

## 2016-05-15 ENCOUNTER — Other Ambulatory Visit: Payer: Self-pay | Admitting: Internal Medicine

## 2016-05-16 ENCOUNTER — Ambulatory Visit: Payer: Medicare Other | Admitting: Psychology

## 2016-05-24 ENCOUNTER — Other Ambulatory Visit: Payer: Self-pay | Admitting: Internal Medicine

## 2016-05-28 ENCOUNTER — Other Ambulatory Visit: Payer: Self-pay | Admitting: Internal Medicine

## 2016-05-28 NOTE — Telephone Encounter (Signed)
Ok to Rf meloxicam? I do not see it on her med list?

## 2016-06-03 ENCOUNTER — Ambulatory Visit (INDEPENDENT_AMBULATORY_CARE_PROVIDER_SITE_OTHER): Payer: Medicare Other | Admitting: Gastroenterology

## 2016-06-03 ENCOUNTER — Encounter: Payer: Self-pay | Admitting: Gastroenterology

## 2016-06-03 VITALS — BP 110/60 | HR 78 | Ht 64.0 in | Wt 205.0 lb

## 2016-06-03 DIAGNOSIS — R1115 Cyclical vomiting syndrome unrelated to migraine: Secondary | ICD-10-CM

## 2016-06-03 DIAGNOSIS — K3184 Gastroparesis: Secondary | ICD-10-CM

## 2016-06-03 DIAGNOSIS — Z8371 Family history of colonic polyps: Secondary | ICD-10-CM | POA: Diagnosis not present

## 2016-06-03 DIAGNOSIS — G43A0 Cyclical vomiting, not intractable: Secondary | ICD-10-CM | POA: Diagnosis not present

## 2016-06-03 DIAGNOSIS — K5900 Constipation, unspecified: Secondary | ICD-10-CM | POA: Diagnosis not present

## 2016-06-03 MED ORDER — NA SULFATE-K SULFATE-MG SULF 17.5-3.13-1.6 GM/177ML PO SOLN: 1.0000 | Freq: Once | ORAL | 0 refills | Status: AC

## 2016-06-03 MED ORDER — LINACLOTIDE 290 MCG PO CAPS: 290.0000 ug | ORAL_CAPSULE | Freq: Every day | ORAL | 6 refills | Status: DC

## 2016-06-03 MED ORDER — ONDANSETRON HCL 4 MG PO TABS: 4.0000 mg | ORAL_TABLET | Freq: Three times a day (TID) | ORAL | 1 refills | Status: DC | PRN

## 2016-06-03 NOTE — Patient Instructions (Addendum)
Increase Linzess to one tablet every day.  I have prescribed ondansetron (zofran) for nausea.  We will schedule an upper endoscopy and a colonoscopy.  If you are age 55 or older, your body mass index should be between 23-30. Your Body mass index is 35.19 kg/m. If this is out of the aforementioned range listed, please consider follow up with your Primary Care Provider.  If you are age 84 or younger, your body mass index should be between 19-25. Your Body mass index is 35.19 kg/m. If this is out of the aformentioned range listed, please consider follow up with your Primary Care Provider.   You have been scheduled for an endoscopy and colonoscopy. Please follow the written instructions given to you at your visit today. Please pick up your prep supplies at the pharmacy within the next 1-3 days. If you use inhalers (even only as needed), please bring them with you on the day of your procedure. Your physician has requested that you go to www.startemmi.com and enter the access code given to you at your visit today. This web site gives a general overview about your procedure. However, you should still follow specific instructions given to you by our office regarding your preparation for the procedure.  Thank you for choosing [facility]  Dr Wilfrid Lund III

## 2016-06-03 NOTE — Progress Notes (Signed)
[facility] Progress Note  Chief Complaint: Vomiting and constipation  Subjective  History:  This is a 56 year old woman last seen by [facility] [place] in June 2016 for chronic constipation and an anal fissure. At that time she also had episodic nausea and vomiting, and upper endoscopy was normal, and a gastric emptying study showed delayed emptying. It appears that she was on Reglan for a few months, then Dr. Deatra Ina stopped it because he was not sure it was helping her. He seems to have suspected that her abdominal bloating was more likely from constipation than the gastroparesis. Patient cannot recall whether the metoclopramide was helpful for the vomiting. She is here today with her mother and a sign language interpreter. For the last year she has had episodic vomiting occur every few days to weeks. She has nausea alone more frequently than that. Last week she had 24 hours of protracted vomiting without clear trigger. She also feels a twisting and cramping sensation in her abdomen and chronic constipation. She takes Linzess 3 times a week. She denies rectal bleeding since hemorrhoids were banded the time of her last colonoscopy in June 2010. Mother would like me to know that she recently was found to have 100s of colon polyps after undergoing a colonoscopy with Dr. Ardis Hughs. She has been apparently referred for genetic testing says she is not a candidate for surgery. I do not have access to that report today.  ROS: Cardiovascular:  no chest pain Respiratory: no dyspnea  The patient's Past Medical, Family and Social History were reviewed and are on file in the EMR.  Objective:  Med list reviewed  Vital signs in last 24 hrs: Vitals:   06/03/16 1059  BP: 110/60  Pulse: 78    Physical Exam    HEENT: sclera anicteric, oral mucosa moist without lesions  Neck: supple, no thyromegaly, JVD or lymphadenopathy  Cardiac: RRR without murmurs, S1S2 heard, no peripheral edema  Pulm: clear to  auscultation bilaterally, normal RR and effort noted  Abdomen: soft, Mild epigastric tenderness, with active bowel sounds. No guarding or palpable hepatosplenomegaly.  Skin; warm and dry, no jaundice or rash  Recent Labs:  CBC    Component Value Date/Time   WBC 10.7 (H) 03/10/2015 0812   RBC 5.09 03/10/2015 0812   HGB 15.2 (H) 03/10/2015 0812   HCT 45.3 03/10/2015 0812   PLT 207.0 03/10/2015 0812   MCV 89.0 03/10/2015 0812   MCHC 33.6 03/10/2015 0812   RDW 13.1 03/10/2015 0812   LYMPHSABS 3.5 03/10/2015 0812   MONOABS 0.7 03/10/2015 0812   EOSABS 0.5 03/10/2015 0812   BASOSABS 0.1 03/10/2015 0812   CMP Latest Ref Rng & Units 03/06/2016 08/02/2015 03/10/2015  Glucose 70 - 99 mg/dL 97 91 131(H)  BUN 6 - 23 mg/dL 8 8 6   Creatinine 0.40 - 1.20 mg/dL 0.82 0.76 0.81  Sodium 135 - 145 mEq/L 137 138 143  Potassium 3.5 - 5.1 mEq/L 3.8 3.4(L) 4.1  Chloride 96 - 112 mEq/L 98 100 105  CO2 19 - 32 mEq/L 31 29 26   Calcium 8.4 - 10.5 mg/dL 9.2 9.3 9.2  Total Protein 6.0 - 8.3 g/dL - - 7.3  Total Bilirubin 0.2 - 1.2 mg/dL - - 0.7  Alkaline Phos 39 - 117 U/L - - 91  AST 0 - 37 U/L - - 39(H)  ALT 0 - 35 U/L - - 29     Radiologic studies: Gastric emptying study report from 2011 reviewed   @ASSESSMENTPLANBEGIN @  Assessment: Encounter Diagnoses  Name Primary?  . Non-intractable cyclical vomiting with nausea   . Gastroparesis Yes  . Constipation, unspecified constipation type   . Family history of colonic polyps    I suspect her vomiting is from gastroparesis, less likely from constipation. 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 Her mother's history of polyps is worrisome for attenuated FAP.  Plan: Zofran. I'm concerned about possible neurologic side effects of metoclopramide in a patient on Abilify. Gastroparesis dietary advice given EGD and colonoscopy  The benefits and risks of the planned procedure were described in detail with the patient or (when appropriate) their  health care proxy.  Risks were outlined as including, but not limited to, bleeding, infection, perforation, adverse medication reaction leading to cardiac or pulmonary decompensation, or pancreatitis (if ERCP).  The limitation of incomplete mucosal visualization was also discussed.  No guarantees or warranties were given.  Increase Linzess to once daily   Total time 30 minutes, over half spent in counseling and coordination of care.   Nelida Meuse III

## 2016-06-05 DIAGNOSIS — F200 Paranoid schizophrenia: Secondary | ICD-10-CM | POA: Diagnosis not present

## 2016-06-18 ENCOUNTER — Encounter: Payer: Self-pay | Admitting: Gastroenterology

## 2016-06-18 ENCOUNTER — Ambulatory Visit (AMBULATORY_SURGERY_CENTER): Payer: Medicare Other | Admitting: Gastroenterology

## 2016-06-18 VITALS — BP 135/84 | HR 78 | Temp 98.9°F | Resp 20 | Ht 64.0 in | Wt 205.0 lb

## 2016-06-18 DIAGNOSIS — K5900 Constipation, unspecified: Secondary | ICD-10-CM | POA: Diagnosis not present

## 2016-06-18 DIAGNOSIS — G43A0 Cyclical vomiting, not intractable: Secondary | ICD-10-CM

## 2016-06-18 DIAGNOSIS — K200 Eosinophilic esophagitis: Secondary | ICD-10-CM | POA: Diagnosis not present

## 2016-06-18 DIAGNOSIS — K2970 Gastritis, unspecified, without bleeding: Secondary | ICD-10-CM | POA: Diagnosis not present

## 2016-06-18 DIAGNOSIS — B9681 Helicobacter pylori [H. pylori] as the cause of diseases classified elsewhere: Secondary | ICD-10-CM | POA: Diagnosis not present

## 2016-06-18 DIAGNOSIS — Z8371 Family history of colonic polyps: Secondary | ICD-10-CM

## 2016-06-18 DIAGNOSIS — R112 Nausea with vomiting, unspecified: Secondary | ICD-10-CM

## 2016-06-18 DIAGNOSIS — K3184 Gastroparesis: Secondary | ICD-10-CM

## 2016-06-18 DIAGNOSIS — K2950 Unspecified chronic gastritis without bleeding: Secondary | ICD-10-CM | POA: Diagnosis not present

## 2016-06-18 DIAGNOSIS — K2990 Gastroduodenitis, unspecified, without bleeding: Secondary | ICD-10-CM

## 2016-06-18 MED ORDER — SODIUM CHLORIDE 0.9 % IV SOLN: 500.0000 mL | INTRAVENOUS | Status: DC

## 2016-06-18 NOTE — Op Note (Signed)
[place] Patient Name: Heidi Good Procedure Date: 06/18/2016 3:09 PM MRN: [id number] Endoscopist: Mallie Mussel L. Loletha Carrow , MD Age: 56 Referring MD:  Date of Birth: [birth year] Gender: Female Account #: [id number] Procedure:                Upper GI endoscopy Indications:              Nausea with vomiting Medicines:                Monitored Anesthesia Care Procedure:                Pre-Anesthesia Assessment:                           - Prior to the procedure, a History and Physical                            was performed, and patient medications and                            allergies were reviewed. The patient's tolerance of                            previous anesthesia was also reviewed. The risks                            and benefits of the procedure and the sedation                            options and risks were discussed with the patient.                            All questions were answered, and informed consent                            was obtained. Prior Anticoagulants: The patient has                            taken no previous anticoagulant or antiplatelet                            agents. ASA Grade Assessment: II - A patient with                            mild systemic disease. After reviewing the risks                            and benefits, the patient was deemed in                            satisfactory condition to undergo the procedure.                           After obtaining informed consent, the endoscope was  passed under direct vision. Throughout the                            procedure, the patient's blood pressure, pulse, and                            oxygen saturations were monitored continuously. The                            Model GIF-HQ190 [id number]) scope was introduced                            through the mouth, and advanced to the second part                            of duodenum. The upper GI endoscopy  was                            accomplished without difficulty. The patient                            tolerated the procedure well. Scope In: Scope Out: Findings:                 Mucosal changes including longitudinal furrows were                            found in the lower third of the esophagus. Biopsies                            were taken with a cold forceps for histology.                           Diffuse mild inflammation characterized by erythema                            and mucosal atrophy was found in the gastric                            antrum. Several biopsies were obtained in the                            gastric body and in the gastric antrum with cold                            forceps for histology.                           The cardia and gastric fundus were normal on                            retroflexion.                           The examined duodenum was normal. Complications:  No immediate complications. Estimated Blood Loss:     Estimated blood loss: none. Impression:               - Non-specific esophageal mucosal changes. Biopsied                            to evaluate for possible for eosinophilic                            esophagitis.                           - Gastritis.                           - Normal examined duodenum.                           - Several biopsies were obtained in the gastric                            body and in the gastric antrum.                           Gastroparesis is still a possibility based on prior                            GES. May be related to underlying constipation as                            well. Recommendation:           - Patient has a contact number available for                            emergencies. The signs and symptoms of potential                            delayed complications were discussed with the                            patient. Return to normal activities tomorrow.                             Written discharge instructions were provided to the                            patient.                           - Resume previous diet.                           - Continue present medications.                           - Await pathology results.                           -  See the other procedure note for documentation of                            additional recommendations. Westin Knotts L. Loletha Carrow, MD 06/18/2016 4:06:55 PM This report has been signed electronically.

## 2016-06-18 NOTE — Progress Notes (Signed)
Called to room to assist during endoscopic procedure.  Patient ID and intended procedure confirmed with present staff. Received instructions for my participation in the procedure from the performing physician.  

## 2016-06-18 NOTE — Op Note (Signed)
[place] Patient Name: Heidi Good Procedure Date: 06/18/2016 3:09 PM MRN: [id number] Endoscopist: Mallie Mussel L. Loletha Carrow , MD Age: 56 Referring MD:  Date of Birth: 15-Feb-1961 Gender: Female Account #: [id number] Procedure:                Colonoscopy Indications:              Constipation Medicines:                Monitored Anesthesia Care Procedure:                Pre-Anesthesia Assessment:                           - Prior to the procedure, a History and Physical                            was performed, and patient medications and                            allergies were reviewed. The patient's tolerance of                            previous anesthesia was also reviewed. The risks                            and benefits of the procedure and the sedation                            options and risks were discussed with the patient.                            All questions were answered, and informed consent                            was obtained. Prior Anticoagulants: The patient has                            taken no previous anticoagulant or antiplatelet                            agents. ASA Grade Assessment: II - A patient with                            mild systemic disease. After reviewing the risks                            and benefits, the patient was deemed in                            satisfactory condition to undergo the procedure.                           After obtaining informed consent, the colonoscope  was passed under direct vision. Throughout the                            procedure, the patient's blood pressure, pulse, and                            oxygen saturations were monitored continuously. The                            Model PCF-[id number]) scope was introduced                            through the anus and advanced to the the cecum,                            identified by appendiceal orifice and ileocecal                            valve. The colonoscopy was performed without                            difficulty. The patient tolerated the procedure                            well. The quality of the bowel preparation was                            good. The ileocecal valve, appendiceal orifice, and                            rectum were photographed. The quality of the bowel                            preparation was evaluated using the BBPS [facility]                            Bowel Preparation Scale) with scores of: Right                            Colon = 2, Transverse Colon = 2 and Left Colon = 2.                            The total BBPS score equals 6. The bowel                            preparation used was SUPREP. Scope In: 3:49:01 PM Scope Out: 3:59:23 PM Scope Withdrawal Time: 0 hours 7 minutes 17 seconds  Total Procedure Duration: 0 hours 10 minutes 22 seconds  Findings:                 The perianal and digital rectal examinations were                            normal.  Multiple small-mouthed diverticula were found in                            the sigmoid colon.                           The exam was otherwise without abnormality on                            direct and retroflexion views. Complications:            No immediate complications. Estimated Blood Loss:     Estimated blood loss: none. Impression:               - Diverticulosis in the sigmoid colon.                           - The examination was otherwise normal on direct                            and retroflexion views.                           - No specimens collected. Recommendation:           - Patient has a contact number available for                            emergencies. The signs and symptoms of potential                            delayed complications were discussed with the                            patient. Return to normal activities tomorrow.                             Written discharge instructions were provided to the                            patient.                           - Resume previous diet.                           - Continue present medications, including daily                            Linzess.                           - Repeat colonoscopy in 10 years for screening                            purposes. The patient's mother was recently found  to have many colon polyps and will undergo genetic                            testing. If genetic abnormality found, this patient                            should also be tested to see if shorter colonoscopy                            interval indicated. Genetta Fiero L. Loletha Carrow, MD 06/18/2016 4:11:34 PM This report has been signed electronically.

## 2016-06-18 NOTE — Patient Instructions (Signed)
YOU HAD AN ENDOSCOPIC PROCEDURE TODAY AT [facility]:   Refer to the procedure report that was given to you for any specific questions about what was found during the examination.  If the procedure report does not answer your questions, please call your gastroenterologist to clarify.  If you requested that your care partner not be given the details of your procedure findings, then the procedure report has been included in a sealed envelope for you to review at your convenience later.  YOU SHOULD EXPECT: Some feelings of bloating in the abdomen. Passage of more gas than usual.  Walking can help get rid of the air that was put into your GI tract during the procedure and reduce the bloating. If you had a lower endoscopy (such as a colonoscopy or flexible sigmoidoscopy) you may notice spotting of blood in your stool or on the toilet paper. If you underwent a bowel prep for your procedure, you may not have a normal bowel movement for a few days.  Please Note:  You might notice some irritation and congestion in your nose or some drainage.  This is from the oxygen used during your procedure.  There is no need for concern and it should clear up in a day or so.  SYMPTOMS TO REPORT IMMEDIATELY:   Following lower endoscopy (colonoscopy or flexible sigmoidoscopy):  Excessive amounts of blood in the stool  Significant tenderness or worsening of abdominal pains  Swelling of the abdomen that is new, acute  Fever of 100F or higher   Following upper endoscopy (EGD)  Vomiting of blood or coffee ground material  New chest pain or pain under the shoulder blades  Painful or persistently difficult swallowing  New shortness of breath  Fever of 100F or higher  Black, tarry-looking stools  For urgent or emergent issues, a gastroenterologist can be reached at any hour by calling [phone].   DIET:  We do recommend a small meal at first, but then you may proceed to your regular diet.  Drink  plenty of fluids but you should avoid alcoholic beverages for 24 hours.  ACTIVITY:  You should plan to take it easy for the rest of today and you should NOT DRIVE or use heavy machinery until tomorrow (because of the sedation medicines used during the test).    FOLLOW UP: Our staff will call the number listed on your records the next business day following your procedure to check on you and address any questions or concerns that you may have regarding the information given to you following your procedure. If we do not reach you, we will leave a message.  However, if you are feeling well and you are not experiencing any problems, there is no need to return our call.  We will assume that you have returned to your regular daily activities without incident.  If any biopsies were taken you will be contacted by phone or by letter within the next 1-3 weeks.  Please call us at [phone] if you have not heard about the biopsies in 3 weeks.   Thank you for allowing [place] to provide your healthcare needs today.   SIGNATURES/CONFIDENTIALITY: You and/or your care partner have signed paperwork which will be entered into your electronic medical record.  These signatures attest to the fact that that the information above on your After Visit Summary has been reviewed and is understood.  Full responsibility of the confidentiality of this discharge information lies with you and/or your care-partner.

## 2016-06-18 NOTE — Progress Notes (Signed)
Spontaneous respirations throughout. VSS. Resting comfortably. To PACU on room air. Report to  Sara RN. 

## 2016-06-18 NOTE — Progress Notes (Signed)
Pt's states no medical or surgical changes since previsit or office visit. Interpreter used today at the [facility] for this pt.  Interpreter's name is- Kes Westmoreland

## 2016-06-18 NOTE — Progress Notes (Signed)
Patient is deaf and there is an interpreter present in recovery to assist with the physician's report of procedure, nursing, and discharge instructions.

## 2016-06-19 ENCOUNTER — Other Ambulatory Visit: Payer: Self-pay

## 2016-06-19 ENCOUNTER — Telehealth: Payer: Self-pay

## 2016-06-19 ENCOUNTER — Other Ambulatory Visit: Payer: Self-pay | Admitting: Gastroenterology

## 2016-06-19 MED ORDER — LINACLOTIDE 290 MCG PO CAPS: 290.0000 ug | ORAL_CAPSULE | Freq: Every day | ORAL | 6 refills | Status: DC

## 2016-06-19 NOTE — Telephone Encounter (Signed)
  Follow up Call-  Call back number 06/18/2016  Post procedure Call Back phone  # [phone]  Permission to leave phone message Yes  Some recent data might be hidden    Patient was called for follow up after her procedure on 06/18/2016. I spoke with the patients mother and she reports that Heidi Good has returned to her normal daily activities without any complications.

## 2016-06-20 ENCOUNTER — Other Ambulatory Visit: Payer: Self-pay

## 2016-06-20 ENCOUNTER — Ambulatory Visit (INDEPENDENT_AMBULATORY_CARE_PROVIDER_SITE_OTHER): Payer: Medicare Other | Admitting: Psychology

## 2016-06-20 ENCOUNTER — Telehealth: Payer: Self-pay | Admitting: Gastroenterology

## 2016-06-20 DIAGNOSIS — F411 Generalized anxiety disorder: Secondary | ICD-10-CM | POA: Diagnosis not present

## 2016-06-20 MED ORDER — LUBIPROSTONE 24 MCG PO CAPS: 24.0000 ug | ORAL_CAPSULE | Freq: Two times a day (BID) | ORAL | 0 refills | Status: DC

## 2016-06-21 NOTE — Telephone Encounter (Signed)
Spoke to pts mother. Rx was changed to Amitiza 70mcg.

## 2016-06-25 ENCOUNTER — Ambulatory Visit (INDEPENDENT_AMBULATORY_CARE_PROVIDER_SITE_OTHER): Payer: Medicare Other | Admitting: Pulmonary Disease

## 2016-06-25 ENCOUNTER — Ambulatory Visit (INDEPENDENT_AMBULATORY_CARE_PROVIDER_SITE_OTHER)
Admission: RE | Admit: 2016-06-25 | Discharge: 2016-06-25 | Disposition: A | Discharge status: Home / Self Care | Payer: Medicare Other | Source: Ambulatory Visit | Attending: Pulmonary Disease | Admitting: Pulmonary Disease

## 2016-06-25 ENCOUNTER — Other Ambulatory Visit (INDEPENDENT_AMBULATORY_CARE_PROVIDER_SITE_OTHER): Payer: Medicare Other

## 2016-06-25 ENCOUNTER — Encounter: Payer: Self-pay | Admitting: Pulmonary Disease

## 2016-06-25 ENCOUNTER — Telehealth: Payer: Self-pay | Admitting: Gastroenterology

## 2016-06-25 VITALS — BP 122/80 | HR 83 | Ht 64.0 in | Wt 207.4 lb

## 2016-06-25 DIAGNOSIS — G4733 Obstructive sleep apnea (adult) (pediatric): Secondary | ICD-10-CM

## 2016-06-25 DIAGNOSIS — J4540 Moderate persistent asthma, uncomplicated: Secondary | ICD-10-CM

## 2016-06-25 DIAGNOSIS — R0602 Shortness of breath: Secondary | ICD-10-CM | POA: Diagnosis not present

## 2016-06-25 LAB — CBC WITH DIFFERENTIAL/PLATELET
BASOS ABS: 0.2 10*3/uL — AB (ref 0.0–0.1)
Basophils Relative: 1.3 % (ref 0.0–3.0)
EOS ABS: 0.5 10*3/uL (ref 0.0–0.7)
Eosinophils Relative: 3.6 % (ref 0.0–5.0)
HCT: 42.3 % (ref 36.0–46.0)
Hemoglobin: 14.6 g/dL (ref 12.0–15.0)
LYMPHS ABS: 5 10*3/uL — AB (ref 0.7–4.0)
Lymphocytes Relative: 34.9 % (ref 12.0–46.0)
MCHC: 34.5 g/dL (ref 30.0–36.0)
MCV: 88.9 fl (ref 78.0–100.0)
Monocytes Absolute: 1 10*3/uL (ref 0.1–1.0)
Monocytes Relative: 6.8 % (ref 3.0–12.0)
NEUTROS ABS: 7.7 10*3/uL (ref 1.4–7.7)
NEUTROS PCT: 53.4 % (ref 43.0–77.0)
PLATELETS: 205 10*3/uL (ref 150.0–400.0)
RBC: 4.76 Mil/uL (ref 3.87–5.11)
RDW: 13.2 % (ref 11.5–15.5)
WBC: 14.4 10*3/uL — ABNORMAL HIGH (ref 4.0–10.5)

## 2016-06-25 LAB — COMPREHENSIVE METABOLIC PANEL
ALT: 25 U/L (ref 0–35)
AST: 33 U/L (ref 0–37)
Albumin: 3.8 g/dL (ref 3.5–5.2)
Alkaline Phosphatase: 94 U/L (ref 39–117)
BILIRUBIN TOTAL: 0.7 mg/dL (ref 0.2–1.2)
BUN: 5 mg/dL — ABNORMAL LOW (ref 6–23)
CHLORIDE: 101 meq/L (ref 96–112)
CO2: 28 meq/L (ref 19–32)
Calcium: 9.2 mg/dL (ref 8.4–10.5)
Creatinine, Ser: 0.61 mg/dL (ref 0.40–1.20)
GFR: 108.23 mL/min (ref >60.00)
GLUCOSE: 77 mg/dL (ref 70–99)
Potassium: 3.1 mEq/L — ABNORMAL LOW (ref 3.5–5.1)
Sodium: 139 mEq/L (ref 135–145)
Total Protein: 7.4 g/dL (ref 6.0–8.3)

## 2016-06-25 LAB — NITRIC OXIDE: Nitric Oxide: 21

## 2016-06-25 MED ORDER — FLUTICASONE PROPIONATE 50 MCG/ACT NA SUSP: 2.0000 | Freq: Every day | NASAL | 2 refills | Status: DC

## 2016-06-25 MED ORDER — MONTELUKAST SODIUM 10 MG PO TABS: 10.0000 mg | ORAL_TABLET | Freq: Every day | ORAL | 5 refills | Status: DC

## 2016-06-25 NOTE — Patient Instructions (Signed)
Flonase two sprays each nostril daily Montelukast 10 mg pill nightly Chest xray and lab tests today Will schedule pulmonary function test and in lab sleep study  Follow up in 4 weeks with Dr. Halford Chessman or Nurse Practitioner

## 2016-06-25 NOTE — Addendum Note (Signed)
Addended by: Virl Cagey on: 06/25/2016 01:18 PM   Modules accepted: Orders

## 2016-06-25 NOTE — Progress Notes (Signed)
Current Outpatient Prescriptions on File Prior to Visit  Medication Sig  . albuterol (PROVENTIL HFA;VENTOLIN HFA) 108 (90 Base) MCG/ACT inhaler Inhale 1-2 puffs into the lungs every 6 (six) hours as needed for wheezing or shortness of breath.  Marland Kitchen albuterol (PROVENTIL) (2.5 MG/3ML) 0.083% nebulizer solution Take 3 mLs (2.5 mg total) by nebulization every 6 (six) hours as needed for wheezing or shortness of breath.  . ALPRAZolam (XANAX) 0.5 MG tablet Take 1 tablet (0.5 mg total) by mouth at bedtime as needed for anxiety.  . ARIPiprazole (ABILIFY) 15 MG tablet Take 15 mg by mouth daily.  . Cholecalciferol (VITAMIN D3) 2000 units capsule Take 1 capsule (2,000 Units total) by mouth daily.  Marland Kitchen estradiol (ESTRACE) 1 MG tablet TAKE 1 TABLET EVERY DAY  . KLOR-CON 8 MEQ tablet TAKE 1 TABLET BY MOUTH EVERY DAY  . levothyroxine (SYNTHROID, LEVOTHROID) 75 MCG tablet Take 1 tablet (75 mcg total) by mouth daily.  Marland Kitchen lubiprostone (AMITIZA) 24 MCG capsule Take 1 capsule (24 mcg total) by mouth 2 (two) times daily with a meal.  . meloxicam (MOBIC) 15 MG tablet TAKE 1 TABLET BY MOUTH EVERY DAY  . mometasone-formoterol (DULERA) 100-5 MCG/ACT AERO 2 puffs then rinse mouth, twice daily - maintenance  . NITRO-BID 2 % ointment Place 1 application rectally. As needed  . ondansetron (ZOFRAN) 4 MG tablet Take 1 tablet (4 mg total) by mouth every 8 (eight) hours as needed for nausea or vomiting.  . pantoprazole (PROTONIX) 40 MG tablet Take 1 tablet (40 mg total) by mouth daily.  . traMADol (ULTRAM) 50 MG tablet Take 1-2 tablets (50-100 mg total) by mouth 2 (two) times daily as needed for severe pain.   Current Facility-Administered Medications on File Prior to Visit  Medication  . 0.9 %  sodium chloride infusion     Chief Complaint  Patient presents with  . Follow-up    Former patient of [facility] - pt states that she has been having some asthma flares. Taking all inhalers as directed. Pt states that she will become breathless  and has noticed that the inhalers are not working 100% as they used to. Pt has been coughing since last Fall 2017. Pt has been off allergy vaccines x 2 months.      Tests FeNO 06/25/16 >> 21  Past medical history Anxiety, Deaf, Depression, Gastroparesis, HLD, Hypothyroidism, DM  Past surgical history, Family history, Social history, Allergies all reviewed.  Vital Signs BP 122/80 (BP Location: Left Arm, Cuff Size: Normal)   Pulse 83   Ht 5\' 4"  (1.626 m)   Wt 207 lb 6.4 oz (94.1 kg)   SpO2 95%   BMI 35.60 kg/m   History of Present Illness Heidi Good is a 56 y.o. female with asthma, and OSA.  She was previously seen by Dr. Annamaria Boots.  She was on allergy shots until 2 months ago.  She has cough, wheeze, clear sputum.  She has sinus congestion and post nasal drip.  She gets winded with activity.  She denies fever, rash, chest pain, abdominal pain, or swelling.  She has trouble with her sleep.  She snores, and wakes up feeling like she can't breath.  She was tested for sleep apnea before, and was on CPAP.  For some reason this was stopped.  She felt CPAP helped her breathing and sleep.  Interview done with assistance of sign language interpretor.   Physical Exam  General - No distress ENT - No sinus tenderness, no oral exudate,  no LAN Cardiac - s1s2 regular, no murmur Chest - No wheeze/rales/dullness Back - No focal tenderness Abd - Soft, non-tender Ext - No edema Neuro - Normal strength Skin - No rashes Psych - normal mood, and behavior   Assessment/Plan  Allergic asthma with rhinitis. - continue dulera and prn albuterol - add flonase and montelukast - CXR, CBC with Diff, CMET, RAST with IgE, PFT  Obstructive sleep apnea. - will arrange for in lab sleep study to assess current status and determine if she should be started back on CPAP   Patient Instructions  Flonase two sprays each nostril daily Montelukast 10 mg pill nightly Chest xray and lab tests today Will  schedule pulmonary function test and in lab sleep study  Follow up in 4 weeks with Dr. Halford Chessman or Nurse Practitioner    Heidi Mires, MD [place] Pager:  [phone] 06/25/2016, 12:59 PM

## 2016-06-26 ENCOUNTER — Other Ambulatory Visit: Payer: Self-pay

## 2016-06-26 DIAGNOSIS — A048 Other specified bacterial intestinal infections: Secondary | ICD-10-CM

## 2016-06-26 MED ORDER — LINACLOTIDE 290 MCG PO CAPS: 290.0000 ug | ORAL_CAPSULE | Freq: Every day | ORAL | 6 refills | Status: DC

## 2016-06-26 MED ORDER — BIS SUBCIT-METRONID-TETRACYC 140-125-125 MG PO CAPS: 3.0000 | ORAL_CAPSULE | Freq: Three times a day (TID) | ORAL | 0 refills | Status: DC

## 2016-06-26 NOTE — Telephone Encounter (Signed)
We can try for a prior authorization if her insurance will go for it.

## 2016-06-26 NOTE — Telephone Encounter (Signed)
Is it okay to change back to Linzess 238mcg one a day ?

## 2016-06-26 NOTE — Telephone Encounter (Signed)
Sent in a new RX. Will work on a prior [place] for the Linzess 290 mcg. Spoke to pts mother to make her aware, also offered some samples of Linzess to hold her over until RX is approved.

## 2016-06-26 NOTE — Progress Notes (Signed)
Letter mailed

## 2016-06-27 ENCOUNTER — Ambulatory Visit: Payer: Self-pay | Admitting: Internal Medicine

## 2016-06-28 ENCOUNTER — Telehealth: Payer: Self-pay | Admitting: Gastroenterology

## 2016-06-28 NOTE — Telephone Encounter (Signed)
Spoke to pts mother. Pylera 10 treatment sample kit is ready for pt pick up. Also aware that the linzess is still pending prior auth.

## 2016-07-02 ENCOUNTER — Telehealth: Payer: Self-pay | Admitting: Pulmonary Disease

## 2016-07-02 DIAGNOSIS — J302 Other seasonal allergic rhinitis: Secondary | ICD-10-CM

## 2016-07-02 DIAGNOSIS — J3089 Other allergic rhinitis: Principal | ICD-10-CM

## 2016-07-02 DIAGNOSIS — J4520 Mild intermittent asthma, uncomplicated: Secondary | ICD-10-CM

## 2016-07-02 NOTE — Telephone Encounter (Signed)
CMP Latest Ref Rng & Units 06/25/2016 03/06/2016 08/02/2015  Glucose 70 - 99 mg/dL 77 97 91  BUN 6 - 23 mg/dL 5(L) 8 8  Creatinine 0.40 - 1.20 mg/dL 0.61 0.82 0.76  Sodium 135 - 145 mEq/L 139 137 138  Potassium 3.5 - 5.1 mEq/L 3.1(L) 3.8 3.4(L)  Chloride 96 - 112 mEq/L 101 98 100  CO2 19 - 32 mEq/L 28 31 29   Calcium 8.4 - 10.5 mg/dL 9.2 9.2 9.3  Total Protein 6.0 - 8.3 g/dL 7.4 - -  Total Bilirubin 0.2 - 1.2 mg/dL 0.7 - -  Alkaline Phos 39 - 117 U/L 94 - -  AST 0 - 37 U/L 33 - -  ALT 0 - 35 U/L 25 - -    CBC Latest Ref Rng & Units 06/25/2016 03/10/2015 10/14/2014  WBC 4.0 - 10.5 K/uL 14.4(H) 10.7(H) 12.4(H)  Hemoglobin 12.0 - 15.0 g/dL 14.6 15.2(H) 14.9  Hematocrit 36.0 - 46.0 % 42.3 45.3 43.3  Platelets 150.0 - 400.0 K/uL 205.0 207.0 226.0    Dg Chest 2 View  Result Date: 06/25/2016 CLINICAL DATA:  Chronic worsening shortness of breath. Initial encounter. EXAM: CHEST  2 VIEW COMPARISON:  Chest radiograph performed 04/03/2016, and CT of the chest performed 04/11/2016 FINDINGS: Mild vascular congestion is noted. Increased interstitial markings may reflect minimal interstitial edema. No pleural effusion or pneumothorax is seen. The heart is borderline normal in size. No acute osseous abnormalities are identified. Osteophyte formation is again noted at the mid to lower thoracic spine. IMPRESSION: Mild vascular congestion. Increased interstitial markings may reflect minimal interstitial edema. Electronically Signed   By: Garald Balding M.D.   On: 06/25/2016 16:29    Will have my nurse inform patient that labs and CXR were okay.  Unfortunately the lab did not draw the respiratory allergy profile >> please have her come back in for this.

## 2016-07-04 NOTE — Telephone Encounter (Signed)
LM x 1 

## 2016-07-05 ENCOUNTER — Other Ambulatory Visit: Payer: Medicare Other

## 2016-07-05 DIAGNOSIS — J302 Other seasonal allergic rhinitis: Secondary | ICD-10-CM | POA: Diagnosis not present

## 2016-07-05 DIAGNOSIS — J4520 Mild intermittent asthma, uncomplicated: Secondary | ICD-10-CM | POA: Diagnosis not present

## 2016-07-05 DIAGNOSIS — J3089 Other allergic rhinitis: Principal | ICD-10-CM

## 2016-07-05 NOTE — Telephone Encounter (Signed)
Results have been explained to patient's Mom, expressed understanding.  Aware that she needs to come back in for her Allergy panel to be drawn.  Allergy Panel re-ordered. Nothing further needed.

## 2016-07-08 ENCOUNTER — Other Ambulatory Visit: Payer: Medicare Other

## 2016-07-08 DIAGNOSIS — A048 Other specified bacterial intestinal infections: Secondary | ICD-10-CM | POA: Diagnosis not present

## 2016-07-08 LAB — RESPIRATORY ALLERGY PROFILE REGION II ~~LOC~~
ALLERGEN, CEDAR TREE, T6: 0.49 kU/L — AB
ALLERGEN, COTTONWOOD, T14: 0.64 kU/L — AB
ALLERGEN, MULBERRY, T70: 0.37 kU/L — AB
Allergen, A. alternata, m6: <0.1 kU/L
Allergen, C. Herbarum, M2: <0.1 kU/L
Allergen, Comm Silver Birch, t9: 0.5 kU/L — ABNORMAL HIGH
Allergen, D pternoyssinus,d7: 0.11 kU/L — ABNORMAL HIGH
Allergen, Oak,t7: 0.59 kU/L — ABNORMAL HIGH
BERMUDA GRASS: 0.6 kU/L — AB
Box Elder IgE: 0.61 kU/L — ABNORMAL HIGH
COMMON RAGWEED: 1.02 kU/L — AB
Cat Dander: 0.12 kU/L — ABNORMAL HIGH
Cockroach: 0.49 kU/L — ABNORMAL HIGH
D. farinae: 0.17 kU/L — ABNORMAL HIGH
Dog Dander: 0.41 kU/L — ABNORMAL HIGH
Elm IgE: 0.84 kU/L — ABNORMAL HIGH
IgE (Immunoglobulin E), Serum: 200 kU/L — ABNORMAL HIGH (ref <115)
Johnson Grass: 0.51 kU/L — ABNORMAL HIGH
PECAN/HICKORY TREE IGE: 0.34 kU/L — AB
Rough Pigweed  IgE: 0.57 kU/L — ABNORMAL HIGH
Sheep Sorrel IgE: 0.59 kU/L — ABNORMAL HIGH
TIMOTHY GRASS: 0.51 kU/L — AB

## 2016-07-09 ENCOUNTER — Encounter: Payer: Self-pay | Admitting: Gastroenterology

## 2016-07-09 LAB — HELICOBACTER PYLORI  SPECIAL ANTIGEN: H. PYLORI ANTIGEN STOOL: NOT DETECTED

## 2016-07-11 ENCOUNTER — Telehealth: Payer: Self-pay | Admitting: Pulmonary Disease

## 2016-07-11 NOTE — Telephone Encounter (Signed)
RAST 07/05/16 >> dust mites, cat, dog, grasses, trees, ragweed, IgE 200   Will have my nurse inform pt that she had reaction to dust mites, cat, dog, grasses, trees, and ragweed on allergy blood test.  She should continue taking flonase and singulair.  Will discuss in more detail at next visit, and determine if additional therapy is needed for her allergies.

## 2016-07-12 NOTE — Telephone Encounter (Signed)
Results have been explained to patient's mother, pt mother expressed understanding. Nothing further needed.

## 2016-07-15 ENCOUNTER — Telehealth: Payer: Self-pay | Admitting: Gastroenterology

## 2016-07-15 ENCOUNTER — Telehealth: Payer: Self-pay

## 2016-07-15 NOTE — Telephone Encounter (Signed)
Yes, I am afraid her IBS is the cause of these symptoms.  I am glad to hear the zofran helps.  I am afraid there is no alternative to the Linzess and her insurance would not cover it even with prior authorization.  She is welcome to some samples when we have them.

## 2016-07-15 NOTE — Telephone Encounter (Signed)
Spoke to mother, let her know that we have only 4 boxes of the Linzess 290 mcg. I have also included a savings program card with the sample. She states that they can pick these up tomorrow.

## 2016-07-15 NOTE — Telephone Encounter (Signed)
Pharmacy is requesting alternative Rx for Pantoprazole 40 mg... Or Submit a Pa... Please advise

## 2016-07-15 NOTE — Telephone Encounter (Signed)
Spoke to patient's mother, patient is complaining about still having nausea and abdominal pain on a daily basis. She states that the Linzess and zofran helped with her symptoms. She states she cannot afford the Linzess. Please advise.

## 2016-07-17 NOTE — Telephone Encounter (Signed)
What Rx is covered? Thx

## 2016-07-18 MED ORDER — OMEPRAZOLE 40 MG PO CPDR: 40.0000 mg | DELAYED_RELEASE_CAPSULE | Freq: Every day | ORAL | 3 refills | Status: DC

## 2016-07-18 NOTE — Telephone Encounter (Signed)
Ok Omeprazole 40 mg po qd Thx

## 2016-07-18 NOTE — Telephone Encounter (Signed)
Omeprazole 40 mg is the only alternative that is covered.. If want to keep on Pantoprazole 40 mg a PA must be done. Please advise

## 2016-07-18 NOTE — Addendum Note (Signed)
Addended by: Cassandria Anger on: 07/18/2016 06:22 PM   Modules accepted: Orders

## 2016-07-19 NOTE — Telephone Encounter (Signed)
Per DPR patient mother notified of medication change and sent to pharmacy

## 2016-07-22 ENCOUNTER — Telehealth: Payer: Self-pay | Admitting: Pulmonary Disease

## 2016-07-22 NOTE — Telephone Encounter (Signed)
ATC pt's mother. Line busy. WCB.

## 2016-07-23 ENCOUNTER — Ambulatory Visit: Payer: Self-pay | Admitting: Adult Health

## 2016-07-23 ENCOUNTER — Encounter (HOSPITAL_COMMUNITY): Payer: Self-pay

## 2016-07-23 NOTE — Telephone Encounter (Signed)
Approval for Linzess 290 mcg has been completed. Pts mother and [facility] have been informed.

## 2016-07-23 NOTE — Telephone Encounter (Signed)
RX Linzess has been approved for 290 mcg one a day. Pts mother and [facility] have been informed.

## 2016-07-24 NOTE — Telephone Encounter (Signed)
Called and spoke to pt's mother, Arbie Cookey. Arbie Cookey states she would like to cancel the PFT and OV and reschedule d/t pt having her wisdom teeth extracted. Arbie Cookey states the pt will have a f/u with the oral surgeon tomorrow and call back tomorrow to reschedule test. Will keep message open to follow up.

## 2016-07-25 ENCOUNTER — Ambulatory Visit: Payer: Self-pay | Admitting: Adult Health

## 2016-07-27 ENCOUNTER — Other Ambulatory Visit: Payer: Self-pay | Admitting: Internal Medicine

## 2016-07-29 ENCOUNTER — Telehealth: Payer: Self-pay | Admitting: Pulmonary Disease

## 2016-07-29 DIAGNOSIS — J4520 Mild intermittent asthma, uncomplicated: Secondary | ICD-10-CM

## 2016-07-29 NOTE — Telephone Encounter (Signed)
Spoke with mother who stated the oral surgeon stated she can have the pft in 4 weeks. The ov was rescheduled for around that time frame and a new order for her pft was placed. Mother is aware that someone will contact her about the pft appointment. Nothing further is needed at this time.

## 2016-07-30 ENCOUNTER — Telehealth: Payer: Self-pay | Admitting: Pulmonary Disease

## 2016-07-30 NOTE — Telephone Encounter (Signed)
Will hold message to call this afternoon per pt's mother request

## 2016-07-30 NOTE — Telephone Encounter (Signed)
lmtcb X1 for pt's mother

## 2016-08-01 NOTE — Telephone Encounter (Signed)
lmomtcb x 2  

## 2016-08-02 NOTE — Telephone Encounter (Signed)
pft has been rescheduled.  Will close encounter.

## 2016-08-12 ENCOUNTER — Encounter (HOSPITAL_BASED_OUTPATIENT_CLINIC_OR_DEPARTMENT_OTHER): Payer: Self-pay

## 2016-08-16 ENCOUNTER — Ambulatory Visit: Payer: Self-pay | Admitting: Adult Health

## 2016-08-26 ENCOUNTER — Ambulatory Visit: Payer: Self-pay | Admitting: Adult Health

## 2016-09-03 NOTE — Progress Notes (Signed)
Pre visit review using our clinic review tool, if applicable. No additional management support is needed unless otherwise documented below in the visit note. 

## 2016-09-03 NOTE — Progress Notes (Signed)
   Subjective:  Patient did not have an AWV

## 2016-09-04 ENCOUNTER — Encounter: Payer: Self-pay | Admitting: Internal Medicine

## 2016-09-04 ENCOUNTER — Other Ambulatory Visit (INDEPENDENT_AMBULATORY_CARE_PROVIDER_SITE_OTHER): Payer: Medicare Other

## 2016-09-04 ENCOUNTER — Ambulatory Visit (INDEPENDENT_AMBULATORY_CARE_PROVIDER_SITE_OTHER): Payer: Medicare Other | Admitting: Internal Medicine

## 2016-09-04 VITALS — BP 126/78 | HR 84 | Temp 98.8°F | Ht 64.0 in | Wt 203.0 lb

## 2016-09-04 DIAGNOSIS — R7309 Other abnormal glucose: Secondary | ICD-10-CM

## 2016-09-04 DIAGNOSIS — R202 Paresthesia of skin: Secondary | ICD-10-CM

## 2016-09-04 DIAGNOSIS — M2550 Pain in unspecified joint: Secondary | ICD-10-CM | POA: Diagnosis not present

## 2016-09-04 DIAGNOSIS — R5383 Other fatigue: Secondary | ICD-10-CM | POA: Diagnosis not present

## 2016-09-04 DIAGNOSIS — E034 Atrophy of thyroid (acquired): Secondary | ICD-10-CM

## 2016-09-04 DIAGNOSIS — F321 Major depressive disorder, single episode, moderate: Secondary | ICD-10-CM

## 2016-09-04 DIAGNOSIS — R7303 Prediabetes: Secondary | ICD-10-CM | POA: Diagnosis not present

## 2016-09-04 LAB — URINALYSIS
Bilirubin Urine: NEGATIVE
HGB URINE DIPSTICK: NEGATIVE
Ketones, ur: NEGATIVE
Leukocytes, UA: NEGATIVE
Nitrite: NEGATIVE
SPECIFIC GRAVITY, URINE: 1.015 (ref 1.000–1.030)
TOTAL PROTEIN, URINE-UPE24: NEGATIVE
URINE GLUCOSE: NEGATIVE
Urobilinogen, UA: 0.2 (ref 0.0–1.0)
pH: 7.5 (ref 5.0–8.0)

## 2016-09-04 LAB — CBC WITH DIFFERENTIAL/PLATELET
BASOS ABS: 0.1 10*3/uL (ref 0.0–0.1)
BASOS PCT: 0.6 % (ref 0.0–3.0)
EOS PCT: 1.3 % (ref 0.0–5.0)
Eosinophils Absolute: 0.2 10*3/uL (ref 0.0–0.7)
HEMATOCRIT: 44.7 % (ref 36.0–46.0)
Hemoglobin: 15.3 g/dL — ABNORMAL HIGH (ref 12.0–15.0)
LYMPHS PCT: 26.8 % (ref 12.0–46.0)
Lymphs Abs: 3.3 10*3/uL (ref 0.7–4.0)
MCHC: 34.1 g/dL (ref 30.0–36.0)
MCV: 87.8 fl (ref 78.0–100.0)
MONOS PCT: 8 % (ref 3.0–12.0)
Monocytes Absolute: 1 10*3/uL (ref 0.1–1.0)
NEUTROS ABS: 7.9 10*3/uL — AB (ref 1.4–7.7)
Neutrophils Relative %: 63.3 % (ref 43.0–77.0)
PLATELETS: 219 10*3/uL (ref 150.0–400.0)
RBC: 5.09 Mil/uL (ref 3.87–5.11)
RDW: 13.1 % (ref 11.5–15.5)
WBC: 12.5 10*3/uL — ABNORMAL HIGH (ref 4.0–10.5)

## 2016-09-04 LAB — HEPATIC FUNCTION PANEL
ALT: 31 U/L (ref 0–35)
AST: 47 U/L — AB (ref 0–37)
Albumin: 3.7 g/dL (ref 3.5–5.2)
Alkaline Phosphatase: 108 U/L (ref 39–117)
BILIRUBIN TOTAL: 0.7 mg/dL (ref 0.2–1.2)
Bilirubin, Direct: 0.1 mg/dL (ref 0.0–0.3)
Total Protein: 7.3 g/dL (ref 6.0–8.3)

## 2016-09-04 LAB — BASIC METABOLIC PANEL
BUN: 5 mg/dL — ABNORMAL LOW (ref 6–23)
CALCIUM: 9 mg/dL (ref 8.4–10.5)
CHLORIDE: 100 meq/L (ref 96–112)
CO2: 30 mEq/L (ref 19–32)
Creatinine, Ser: 0.69 mg/dL (ref 0.40–1.20)
GFR: 93.81 mL/min (ref >60.00)
Glucose, Bld: 99 mg/dL (ref 70–99)
POTASSIUM: 3.7 meq/L (ref 3.5–5.1)
SODIUM: 137 meq/L (ref 135–145)

## 2016-09-04 LAB — HEMOGLOBIN A1C: Hgb A1c MFr Bld: 6.4 % (ref 4.6–6.5)

## 2016-09-04 LAB — TSH: TSH: 0.88 u[IU]/mL (ref 0.35–4.50)

## 2016-09-04 LAB — VITAMIN B12: VITAMIN B 12: 570 pg/mL (ref 211–911)

## 2016-09-04 LAB — SEDIMENTATION RATE: SED RATE: 45 mm/h — AB (ref 0–30)

## 2016-09-04 MED ORDER — LEVOTHYROXINE SODIUM 75 MCG PO TABS: 75.0000 ug | ORAL_TABLET | Freq: Every day | ORAL | 3 refills | Status: DC

## 2016-09-04 NOTE — Assessment & Plan Note (Signed)
Levothroid °Labs °

## 2016-09-04 NOTE — Progress Notes (Signed)
Subjective:  Patient ID: Heidi Good, female    DOB: 28-Mar-1961  Age: 56 y.o. MRN: [id number]  CC: No chief complaint on file.   HPI [facility] presents for fatigue and achyness x 2 weeks, poor sleep f/u   Outpatient Medications Prior to Visit  Medication Sig Dispense Refill  . albuterol (PROVENTIL HFA;VENTOLIN HFA) 108 (90 Base) MCG/ACT inhaler Inhale 1-2 puffs into the lungs every 6 (six) hours as needed for wheezing or shortness of breath. 1 Inhaler 5  . albuterol (PROVENTIL) (2.5 MG/3ML) 0.083% nebulizer solution Take 3 mLs (2.5 mg total) by nebulization every 6 (six) hours as needed for wheezing or shortness of breath. 75 mL 12  . ALPRAZolam (XANAX) 0.5 MG tablet Take 1 tablet (0.5 mg total) by mouth at bedtime as needed for anxiety. 30 tablet 0  . ARIPiprazole (ABILIFY) 15 MG tablet Take 15 mg by mouth daily.  0  . bismuth-metronidazole-tetracycline (PYLERA) 140-125-125 MG capsule Take 3 capsules by mouth 4 (four) times daily -  before meals and at bedtime. 120 capsule 0  . Cholecalciferol (VITAMIN D3) 2000 units capsule Take 1 capsule (2,000 Units total) by mouth daily. 100 capsule 3  . estradiol (ESTRACE) 1 MG tablet TAKE 1 TABLET EVERY DAY 90 tablet 1  . fluticasone (FLONASE) 50 MCG/ACT nasal spray Place 2 sprays into both nostrils daily. 16 g 2  . KLOR-CON 8 MEQ tablet TAKE 1 TABLET BY MOUTH EVERY DAY 90 tablet 1  . levothyroxine (SYNTHROID, LEVOTHROID) 75 MCG tablet Take 1 tablet (75 mcg total) by mouth daily. 90 tablet 3  . linaclotide (LINZESS) 290 MCG CAPS capsule Take 1 capsule (290 mcg total) by mouth daily before breakfast. 30 capsule 6  . lubiprostone (AMITIZA) 24 MCG capsule Take 1 capsule (24 mcg total) by mouth 2 (two) times daily with a meal. 60 capsule 0  . meloxicam (MOBIC) 15 MG tablet TAKE 1 TABLET BY MOUTH EVERY DAY 30 tablet 2  . mometasone-formoterol (DULERA) 100-5 MCG/ACT AERO 2 puffs then rinse mouth, twice daily - maintenance 1 Inhaler 11  .  montelukast (SINGULAIR) 10 MG tablet Take 1 tablet (10 mg total) by mouth at bedtime. 30 tablet 5  . NITRO-BID 2 % ointment Place 1 application rectally. As needed    . omeprazole (PRILOSEC) 40 MG capsule Take 1 capsule (40 mg total) by mouth daily. 90 capsule 3  . ondansetron (ZOFRAN) 4 MG tablet Take 1 tablet (4 mg total) by mouth every 8 (eight) hours as needed for nausea or vomiting. 30 tablet 1  . traMADol (ULTRAM) 50 MG tablet Take 1-2 tablets (50-100 mg total) by mouth 2 (two) times daily as needed for severe pain. 100 tablet 1   Facility-Administered Medications Prior to Visit  Medication Dose Route Frequency Provider Last Rate Last Dose  . 0.9 %  sodium chloride infusion  500 mL Intravenous Continuous Nelida Meuse III, MD        ROS Review of Systems  Constitutional: Positive for fatigue. Negative for activity change, appetite change, chills and unexpected weight change.  HENT: Negative for congestion, mouth sores and sinus pressure.   Eyes: Negative for visual disturbance.  Respiratory: Negative for cough and chest tightness.   Gastrointestinal: Negative for abdominal pain and nausea.  Genitourinary: Negative for difficulty urinating, frequency and vaginal pain.  Musculoskeletal: Positive for arthralgias. Negative for back pain and gait problem.  Skin: Negative for pallor and rash.  Neurological: Negative for dizziness, tremors, weakness, numbness and  headaches.  Psychiatric/Behavioral: Negative for confusion and sleep disturbance.    Objective:  BP 126/78 (BP Location: Left Arm, Patient Position: Sitting, Cuff Size: Large)   Pulse 84   Temp 98.8 F (37.1 C) (Oral)   Ht 5\' 4"  (1.626 m)   Wt 203 lb 0.6 oz (92.1 kg)   SpO2 98%   BMI 34.85 kg/m   BP Readings from Last 3 Encounters:  09/04/16 126/78  06/25/16 122/80  06/18/16 135/84    Wt Readings from Last 3 Encounters:  09/04/16 203 lb 0.6 oz (92.1 kg)  06/25/16 207 lb 6.4 oz (94.1 kg)  06/18/16 205 lb (93 kg)     Physical Exam  Constitutional: She appears well-developed. No distress.  HENT:  Head: Normocephalic.  Right Ear: External ear normal.  Left Ear: External ear normal.  Nose: Nose normal.  Mouth/Throat: Oropharynx is clear and moist.  Eyes: Conjunctivae are normal. Pupils are equal, round, and reactive to light. Right eye exhibits no discharge. Left eye exhibits no discharge.  Neck: Normal range of motion. Neck supple. No JVD present. No tracheal deviation present. No thyromegaly present.  Cardiovascular: Normal rate, regular rhythm and normal heart sounds.   Pulmonary/Chest: No stridor. No respiratory distress. She has no wheezes.  Abdominal: Soft. Bowel sounds are normal. She exhibits no distension and no mass. There is no tenderness. There is no rebound and no guarding.  Musculoskeletal: She exhibits no edema or tenderness.  Lymphadenopathy:    She has no cervical adenopathy.  Neurological: She displays normal reflexes. No cranial nerve deficit. She exhibits normal muscle tone. Coordination normal.  Skin: No rash noted. No erythema.  Psychiatric: She has a normal mood and affect. Her behavior is normal. Judgment and thought content normal.    Lab Results  Component Value Date   WBC 14.4 (H) 06/25/2016   HGB 14.6 06/25/2016   HCT 42.3 06/25/2016   PLT 205.0 06/25/2016   GLUCOSE 77 06/25/2016   CHOL 137 03/10/2015   TRIG 194.0 (H) 03/10/2015   HDL 43.10 03/10/2015   LDLCALC 55 03/10/2015   ALT 25 06/25/2016   AST 33 06/25/2016   NA 139 06/25/2016   K 3.1 (L) 06/25/2016   CL 101 06/25/2016   CREATININE 0.61 06/25/2016   BUN 5 (L) 06/25/2016   CO2 28 06/25/2016   TSH 0.84 08/02/2015   HGBA1C 6.1 08/02/2015    Dg Chest 2 View  Result Date: 06/25/2016 CLINICAL DATA:  Chronic worsening shortness of breath. Initial encounter. EXAM: CHEST  2 VIEW COMPARISON:  Chest radiograph performed 04/03/2016, and CT of the chest performed 04/11/2016 FINDINGS: Mild vascular congestion  is noted. Increased interstitial markings may reflect minimal interstitial edema. No pleural effusion or pneumothorax is seen. The heart is borderline normal in size. No acute osseous abnormalities are identified. Osteophyte formation is again noted at the mid to lower thoracic spine. IMPRESSION: Mild vascular congestion. Increased interstitial markings may reflect minimal interstitial edema. Electronically Signed   By: Garald Balding M.D.   On: 06/25/2016 16:29    Assessment & Plan:   There are no diagnoses linked to this encounter. I am having Ms. Bissette maintain her albuterol, ALPRAZolam, NITRO-BID, ARIPiprazole, traMADol, levothyroxine, Vitamin D3, albuterol, mometasone-formoterol, estradiol, meloxicam, ondansetron, lubiprostone, fluticasone, montelukast, linaclotide, bismuth-metronidazole-tetracycline, omeprazole, and KLOR-CON. We will continue to administer sodium chloride.  No orders of the defined types were placed in this encounter.    Follow-up: No Follow-up on file.  Walker Kehr, MD

## 2016-09-04 NOTE — Assessment & Plan Note (Signed)
Labs

## 2016-09-04 NOTE — Assessment & Plan Note (Signed)
Abilify

## 2016-09-04 NOTE — Assessment & Plan Note (Addendum)
4/18 ?viral vs other Labs

## 2016-09-04 NOTE — Assessment & Plan Note (Addendum)
Worse 4/18 Sleep test pending

## 2016-09-10 ENCOUNTER — Ambulatory Visit (INDEPENDENT_AMBULATORY_CARE_PROVIDER_SITE_OTHER): Payer: Medicare Other | Admitting: Pulmonary Disease

## 2016-09-10 ENCOUNTER — Other Ambulatory Visit: Payer: Self-pay | Admitting: Internal Medicine

## 2016-09-10 DIAGNOSIS — J4520 Mild intermittent asthma, uncomplicated: Secondary | ICD-10-CM | POA: Diagnosis not present

## 2016-09-10 LAB — PULMONARY FUNCTION TEST
DL/VA % PRED: 117 %
DL/VA: 5.79 ml/min/mmHg/L
DLCO UNC % PRED: 86 %
DLCO cor % pred: 83 %
DLCO cor: 21.29 ml/min/mmHg
DLCO unc: 22.04 ml/min/mmHg
FEF 25-75 PRE: 2.05 L/s
FEF 25-75 Post: 2.63 L/sec
FEF2575-%CHANGE-POST: 28 %
FEF2575-%Pred-Post: 100 %
FEF2575-%Pred-Pre: 78 %
FEV1-%Change-Post: 4 %
FEV1-%PRED-PRE: 74 %
FEV1-%Pred-Post: 77 %
FEV1-PRE: 2.06 L
FEV1-Post: 2.15 L
FEV1FVC-%CHANGE-POST: 4 %
FEV1FVC-%Pred-Pre: 101 %
FEV6-%CHANGE-POST: 0 %
FEV6-%PRED-POST: 74 %
FEV6-%Pred-Pre: 74 %
FEV6-POST: 2.55 L
FEV6-Pre: 2.56 L
FEV6FVC-%Change-Post: 0 %
FEV6FVC-%PRED-PRE: 102 %
FEV6FVC-%Pred-Post: 103 %
FVC-%CHANGE-POST: 0 %
FVC-%PRED-POST: 71 %
FVC-%Pred-Pre: 72 %
FVC-Post: 2.55 L
FVC-Pre: 2.56 L
POST FEV6/FVC RATIO: 100 %
PRE FEV6/FVC RATIO: 100 %
Post FEV1/FVC ratio: 84 %
Pre FEV1/FVC ratio: 80 %
RV % PRED: 106 %
RV: 2.07 L
TLC % pred: 90 %
TLC: 4.72 L

## 2016-09-10 NOTE — Progress Notes (Signed)
PFT done today. 

## 2016-09-11 ENCOUNTER — Ambulatory Visit (INDEPENDENT_AMBULATORY_CARE_PROVIDER_SITE_OTHER): Payer: Medicare Other | Admitting: Adult Health

## 2016-09-11 ENCOUNTER — Encounter: Payer: Self-pay | Admitting: Adult Health

## 2016-09-11 DIAGNOSIS — J3089 Other allergic rhinitis: Secondary | ICD-10-CM | POA: Diagnosis not present

## 2016-09-11 DIAGNOSIS — J4520 Mild intermittent asthma, uncomplicated: Secondary | ICD-10-CM | POA: Diagnosis not present

## 2016-09-11 DIAGNOSIS — J302 Other seasonal allergic rhinitis: Secondary | ICD-10-CM

## 2016-09-11 DIAGNOSIS — G4733 Obstructive sleep apnea (adult) (pediatric): Secondary | ICD-10-CM | POA: Diagnosis not present

## 2016-09-11 NOTE — Patient Instructions (Signed)
Continue on current regimen .  Follow up for sleep study as planned .  follow up Dr. Halford Chessman  In in 6-8 weeks and As needed

## 2016-09-11 NOTE — Progress Notes (Signed)
@Patient  ID: Heidi Good, female    DOB: May 18, 1960, 56 y.o.   MRN: [id number]  Chief Complaint  Patient presents with  . Follow-up    Asthma     Referring provider: Cassandria Anger, MD  HPI: 56 yo female never smoker followed for asthma and AR (previously on allergy vaccines)  Pt is deaf   TEST  FENO 06/2016 >21   09/11/2016 Follow up : Asthma  Interpretor here Pt returns for a 3 month follow up . She was having more asthma symptoms with cough and clear mucus. She was started on Singulair . Feels it is helping her.  She has less cough , breathing is getting better. Allergy testing was very positive for grass, cat/dog dander, trees. IgE 200. Says allergies are doing okay . Remains on flonase.  PFT done yesterday showed Minimal obstruction ,+ BD response in small airways .  DLCO was normal .   She does a previous dx of OSA but not using CPAP anymore. Has upcoming sleep study this month. Does snore and has daytime sleepiness.   Allergies  Allergen Reactions  . Doxycycline Nausea And Vomiting  . Hydrocodone     Nausea from cough syrup  . Other     Dog, cat, nuts, grass, trees    Immunization History  Administered Date(s) Administered  . Influenza Split 02/11/2011, 02/24/2012  . Influenza,inj,Quad PF,36+ Mos 02/16/2013, 01/26/2014, 01/25/2015, 01/16/2016  . Pneumococcal Conjugate-13 03/06/2016  . Tdap 03/08/2015    Past Medical History:  Diagnosis Date  . Anxiety   . Constipation   . Deafness   . Depression   . Gastroparesis   . Hyperlipemia   . Hypothyroidism   . Type II or unspecified type diabetes mellitus without mention of complication, not stated as uncontrolled   . Unspecified constipation 07/17/2013    Tobacco History: History  Smoking Status  . Never Smoker  Smokeless Tobacco  . Never Used   Counseling given: Not Answered   Outpatient Encounter Prescriptions as of 09/11/2016  Medication Sig  . albuterol (PROVENTIL HFA;VENTOLIN HFA) 108 (90  Base) MCG/ACT inhaler Inhale 1-2 puffs into the lungs every 6 (six) hours as needed for wheezing or shortness of breath.  Marland Kitchen albuterol (PROVENTIL) (2.5 MG/3ML) 0.083% nebulizer solution Take 3 mLs (2.5 mg total) by nebulization every 6 (six) hours as needed for wheezing or shortness of breath.  . ALPRAZolam (XANAX) 0.5 MG tablet Take 1 tablet (0.5 mg total) by mouth at bedtime as needed for anxiety.  . ARIPiprazole (ABILIFY) 15 MG tablet Take 15 mg by mouth daily.  Marland Kitchen bismuth-metronidazole-tetracycline (PYLERA) 140-125-125 MG capsule Take 3 capsules by mouth 4 (four) times daily -  before meals and at bedtime.  . Cholecalciferol (VITAMIN D3) 2000 units capsule Take 1 capsule (2,000 Units total) by mouth daily.  Marland Kitchen estradiol (ESTRACE) 1 MG tablet TAKE 1 TABLET EVERY DAY  . fluticasone (FLONASE) 50 MCG/ACT nasal spray Place 2 sprays into both nostrils daily.  Marland Kitchen KLOR-CON 8 MEQ tablet TAKE 1 TABLET BY MOUTH EVERY DAY  . levothyroxine (SYNTHROID, LEVOTHROID) 75 MCG tablet Take 1 tablet (75 mcg total) by mouth daily.  Marland Kitchen linaclotide (LINZESS) 290 MCG CAPS capsule Take 1 capsule (290 mcg total) by mouth daily before breakfast.  . lubiprostone (AMITIZA) 24 MCG capsule Take 1 capsule (24 mcg total) by mouth 2 (two) times daily with a meal.  . meloxicam (MOBIC) 15 MG tablet TAKE 1 TABLET BY MOUTH EVERY DAY  . mometasone-formoterol (DULERA)  100-5 MCG/ACT AERO 2 puffs then rinse mouth, twice daily - maintenance  . montelukast (SINGULAIR) 10 MG tablet Take 1 tablet (10 mg total) by mouth at bedtime.  Marland Kitchen NITRO-BID 2 % ointment Place 1 application rectally. As needed  . omeprazole (PRILOSEC) 40 MG capsule Take 1 capsule (40 mg total) by mouth daily.  . ondansetron (ZOFRAN) 4 MG tablet Take 1 tablet (4 mg total) by mouth every 8 (eight) hours as needed for nausea or vomiting.  . traMADol (ULTRAM) 50 MG tablet Take 1-2 tablets (50-100 mg total) by mouth 2 (two) times daily as needed for severe pain.    Facility-Administered Encounter Medications as of 09/11/2016  Medication  . 0.9 %  sodium chloride infusion     Review of Systems  Constitutional:   No  weight loss, night sweats,  Fevers, chills, fatigue, or  lassitude.  HEENT:   No headaches,  Difficulty swallowing,  Tooth/dental problems, or  Sore throat,                No sneezing, itching, ear ache,  +nasal congestion, post nasal drip,   CV:  No chest pain,  Orthopnea, PND, swelling in lower extremities, anasarca, dizziness, palpitations, syncope.   GI  No heartburn, indigestion, abdominal pain, nausea, vomiting, diarrhea, change in bowel habits, loss of appetite, bloody stools.   Resp:   No wheezing.  No chest wall deformity  Skin: no rash or lesions.  GU: no dysuria, change in color of urine, no urgency or frequency.  No flank pain, no hematuria   MS:  No joint pain or swelling.  No decreased range of motion.  No back pain.    Physical Exam  BP 130/84 (BP Location: Left Arm, Cuff Size: Normal)   Pulse 80   Ht 5\' 4"  (1.626 m)   Wt 204 lb (92.5 kg)   SpO2 96%   BMI 35.02 kg/m   GEN: A/Ox3; pleasant , NAD, well nourished    HEENT:  [place]/AT,  EACs-clear, TMs-wnl, NOSE-clear drainage , THROAT-clear, no lesions, no postnasal drip or exudate noted.   NECK:  Supple w/ fair ROM; no JVD; normal carotid impulses w/o bruits; no thyromegaly or nodules palpated; no lymphadenopathy.    RESP  Clear  P & A; w/o, wheezes/ rales/ or rhonchi. no accessory muscle use, no dullness to percussion  CARD:  RRR, no m/r/g, no peripheral edema, pulses intact, no cyanosis or clubbing.  GI:   Soft & nt; nml bowel sounds; no organomegaly or masses detected.   Musco: Warm bil, no deformities or joint swelling noted.   Neuro: alert, no focal deficits noted.    Skin: Warm, no lesions or rashes    Lab Results:  CBC  BMET  BNP No results found for: BNP  ProBNP No results found for: PROBNP  Imaging: No results  found.   Assessment & Plan:   No problem-specific Assessment & Plan notes found for this encounter.     Rexene Edison, NP 09/11/2016

## 2016-09-12 NOTE — Assessment & Plan Note (Signed)
Improved control  Cont on current regimen

## 2016-09-12 NOTE — Assessment & Plan Note (Signed)
Follow up for sleep study this month as planned.

## 2016-09-12 NOTE — Assessment & Plan Note (Signed)
Improved control on current regimen   Plan  Patient Instructions  Continue on current regimen .  Follow up for sleep study as planned .  follow up Dr. Halford Chessman  In in 6-8 weeks and As needed

## 2016-09-18 ENCOUNTER — Ambulatory Visit: Payer: Medicare Other | Admitting: Psychology

## 2016-09-24 ENCOUNTER — Ambulatory Visit (HOSPITAL_BASED_OUTPATIENT_CLINIC_OR_DEPARTMENT_OTHER): Payer: Medicare Other | Attending: Pulmonary Disease | Admitting: Pulmonary Disease

## 2016-09-24 DIAGNOSIS — R0683 Snoring: Secondary | ICD-10-CM

## 2016-09-24 DIAGNOSIS — G4733 Obstructive sleep apnea (adult) (pediatric): Secondary | ICD-10-CM

## 2016-09-24 DIAGNOSIS — G4730 Sleep apnea, unspecified: Secondary | ICD-10-CM | POA: Diagnosis present

## 2016-09-25 ENCOUNTER — Ambulatory Visit (INDEPENDENT_AMBULATORY_CARE_PROVIDER_SITE_OTHER): Payer: Medicare Other | Admitting: Pulmonary Disease

## 2016-09-25 ENCOUNTER — Telehealth: Payer: Self-pay | Admitting: Pulmonary Disease

## 2016-09-25 ENCOUNTER — Encounter: Payer: Self-pay | Admitting: Pulmonary Disease

## 2016-09-25 VITALS — BP 122/78 | HR 87 | Ht 65.0 in | Wt 202.4 lb

## 2016-09-25 DIAGNOSIS — R0683 Snoring: Secondary | ICD-10-CM | POA: Insufficient documentation

## 2016-09-25 DIAGNOSIS — J3089 Other allergic rhinitis: Secondary | ICD-10-CM

## 2016-09-25 DIAGNOSIS — J4550 Severe persistent asthma, uncomplicated: Secondary | ICD-10-CM

## 2016-09-25 NOTE — Telephone Encounter (Signed)
PSG 09/24/16 >> AHI 0, SpO2 low 90%   Will have my nurse inform pt that sleep study did not show sleep apnea.  She doesn't need to start CPAP again.

## 2016-09-25 NOTE — Patient Instructions (Signed)
Try using claritin 10 mg daily Continue dulera, singulair, and flonase Follow up in 6 to 8 weeks with Dr. Halford Chessman or Nurse Practitioner

## 2016-09-25 NOTE — Telephone Encounter (Signed)
Pt seen in office today with Dr Halford Chessman. Results discussed at that time. Nothing further needed.

## 2016-09-25 NOTE — Progress Notes (Signed)
Current Outpatient Prescriptions on File Prior to Visit  Medication Sig  . albuterol (PROVENTIL HFA;VENTOLIN HFA) 108 (90 Base) MCG/ACT inhaler Inhale 1-2 puffs into the lungs every 6 (six) hours as needed for wheezing or shortness of breath.  Marland Kitchen albuterol (PROVENTIL) (2.5 MG/3ML) 0.083% nebulizer solution Take 3 mLs (2.5 mg total) by nebulization every 6 (six) hours as needed for wheezing or shortness of breath.  . ALPRAZolam (XANAX) 0.5 MG tablet Take 1 tablet (0.5 mg total) by mouth at bedtime as needed for anxiety.  . ARIPiprazole (ABILIFY) 15 MG tablet Take 15 mg by mouth daily.  Marland Kitchen bismuth-metronidazole-tetracycline (PYLERA) 140-125-125 MG capsule Take 3 capsules by mouth 4 (four) times daily -  before meals and at bedtime.  . Cholecalciferol (VITAMIN D3) 2000 units capsule Take 1 capsule (2,000 Units total) by mouth daily.  Marland Kitchen estradiol (ESTRACE) 1 MG tablet TAKE 1 TABLET EVERY DAY  . fluticasone (FLONASE) 50 MCG/ACT nasal spray Place 2 sprays into both nostrils daily.  Marland Kitchen KLOR-CON 8 MEQ tablet TAKE 1 TABLET BY MOUTH EVERY DAY  . levothyroxine (SYNTHROID, LEVOTHROID) 75 MCG tablet Take 1 tablet (75 mcg total) by mouth daily.  Marland Kitchen linaclotide (LINZESS) 290 MCG CAPS capsule Take 1 capsule (290 mcg total) by mouth daily before breakfast.  . lubiprostone (AMITIZA) 24 MCG capsule Take 1 capsule (24 mcg total) by mouth 2 (two) times daily with a meal.  . meloxicam (MOBIC) 15 MG tablet TAKE 1 TABLET BY MOUTH EVERY DAY  . mometasone-formoterol (DULERA) 100-5 MCG/ACT AERO 2 puffs then rinse mouth, twice daily - maintenance  . montelukast (SINGULAIR) 10 MG tablet Take 1 tablet (10 mg total) by mouth at bedtime.  Marland Kitchen NITRO-BID 2 % ointment Place 1 application rectally. As needed  . omeprazole (PRILOSEC) 40 MG capsule Take 1 capsule (40 mg total) by mouth daily.  . ondansetron (ZOFRAN) 4 MG tablet Take 1 tablet (4 mg total) by mouth every 8 (eight) hours as needed for nausea or vomiting.  . traMADol (ULTRAM)  50 MG tablet Take 1-2 tablets (50-100 mg total) by mouth 2 (two) times daily as needed for severe pain.   Current Facility-Administered Medications on File Prior to Visit  Medication  . 0.9 %  sodium chloride infusion     Chief Complaint  Patient presents with  . Acute Visit    C/o increased cough x 2-3 day with SOB. Pt had sleep study last night and she coughed all througout the test. Denies mucus production.      Pulmonary tests FeNO 06/25/16 >> 21 RAST 07/05/16 >> multiple allergies, IgE 200 PFT 09/10/16 >> FEV1 2.15 (77%), FEV1% 84%, TLC 4.72 (90%), DLCO 86%, no BD  Sleep tests PSG 09/24/16 >> AHI 0, SpO2 low 90%, minimal sleep time  Past medical history Anxiety, Deaf, Depression, Gastroparesis, HLD, Hypothyroidism, DM  Past surgical history, Family history, Social history, Allergies all reviewed.  Vital Signs BP 122/78 (BP Location: Left Arm, Cuff Size: Normal)   Pulse 87   Ht 5\' 5"  (1.651 m)   Wt 202 lb 6.4 oz (91.8 kg)   SpO2 97%   BMI 33.68 kg/m   History of Present Illness Heidi Good is a 56 y.o. female with allergic asthma.  She developed more cough over the past several weeks.  She has noticed more breathing trouble also.  She had sleep study last night which didn't show apnea, but she had limited sleep time.  She is getting sinus congestion with post nasal drip.  She is not having chest pain, fever, hemoptysis, leg swelling or skin rash.  Physical Exam  General - No distress Eyes - pupils reactive ENT - No sinus tenderness, no oral exudate, no LAN, clear nasal drainage Cardiac - s1s2 regular, no murmur Chest - No wheeze/rales/dullness Back - No focal tenderness Abd - Soft, non-tender Ext - No edema Neuro - Normal strength Skin - No rashes Psych - normal mood, and behavior   Assessment/Plan  Upper airway cough from allergic rhinitis. Allergic asthma. - will have her try claritin daily - continue singulair, and flonase - if symptoms persist,  then might need to consider adding biologic agent - continue dulera, and prn albuterol  History of OSA. - recent sleep study didn't show significant apnea, but she had limited sleep time - will monitor her sleep pattern and determine if she needs to have retesting   Patient Instructions  Try using claritin 10 mg daily Continue dulera, singulair, and flonase Follow up in 6 to 8 weeks with Dr. Halford Chessman or Nurse Practitioner    [name], MD [place] Pager:  [phone] 09/25/2016, 1:47 PM

## 2016-09-25 NOTE — Procedures (Signed)
   Patient Name: Heidi Good, Heidi Good Date: 09/24/2016 Gender: Female D.O.B: Jul 14, 1960 Age (years): 25 Referring Provider: [name] MD, ABSM Height (inches): 65 Interpreting Physician: [name] MD, ABSM Weight (lbs): 204 RPSGT: Carolin Coy BMI: 34 MRN: [id number] Neck Size: 15.00  CLINICAL INFORMATION Sleep Study Type: NPSG  Indication for sleep study: Obesity, OSA, Snoring  Epworth Sleepiness Score: 7  SLEEP STUDY TECHNIQUE As per the AASM Manual for the Scoring of Sleep and Associated Events v2.3 (April 2016) with a hypopnea requiring 4% desaturations.  The channels recorded and monitored were frontal, central and occipital EEG, electrooculogram (EOG), submentalis EMG (chin), nasal and oral airflow, thoracic and abdominal wall motion, anterior tibialis EMG, snore microphone, electrocardiogram, and pulse oximetry.  MEDICATIONS Medications self-administered by patient taken the night of the study : [place] The study was initiated at 10:02:15 PM and ended at 4:45:35 AM.  Sleep onset time was 224.4 minutes and the sleep efficiency was 9.3%. The total sleep time was 37.5 minutes.  Stage REM latency was N/A minutes.  The patient spent 52.00% of the night in stage N1 sleep, 48.00% in stage N2 sleep, 0.00% in stage N3 and 0.00% in REM.  Alpha intrusion was absent.  Supine sleep was 100.00%.  RESPIRATORY PARAMETERS The overall apnea/hypopnea index (AHI) was 0.0 per hour. There were 0 total apneas, including 0 obstructive, 0 central and 0 mixed apneas. There were 0 hypopneas and 2 RERAs.  The AHI during Stage REM sleep was N/A per hour.  AHI while supine was 0.0 per hour.  The mean oxygen saturation was 92.37%. The minimum SpO2 during sleep was 90.00%.  Moderate snoring was noted during this study.  CARDIAC DATA The 2 lead EKG demonstrated sinus rhythm. The mean heart rate was 63.15 beats per minute. Other EKG findings include:  PVCs.  LEG MOVEMENT DATA The total PLMS were 0 with a resulting PLMS index of 0.00. Associated arousal with leg movement index was 0.0 .  IMPRESSIONS - No significant obstructive sleep apnea occurred during this study (AHI = 0.0/h). - No significant central sleep apnea occurred during this study (CAI = 0.0/h). - The patient had minimal or no oxygen desaturation during the study (Min O2 = 90.00%) - The patient snored with Moderate snoring volume.  DIAGNOSIS - Snoring (R06.83)  RECOMMENDATIONS - Avoid alcohol, sedatives and other CNS depressants that may worsen sleep apnea and disrupt normal sleep architecture. - Sleep hygiene should be reviewed to assess factors that may improve sleep quality. - Weight management and regular exercise should be initiated or continued if appropriate.  [Electronically signed] 09/25/2016 12:56 PM  [name] MD, [place], American Board of Sleep Medicine   NPI: [id number]

## 2016-09-27 ENCOUNTER — Ambulatory Visit: Payer: Medicare Other | Admitting: Psychology

## 2016-10-04 ENCOUNTER — Telehealth: Payer: Self-pay | Admitting: Pulmonary Disease

## 2016-10-04 MED ORDER — PREDNISONE 10 MG PO TABS: ORAL_TABLET | ORAL | 0 refills | Status: DC

## 2016-10-04 NOTE — Telephone Encounter (Signed)
Send prednisone 10 mg pill >> 3 pills daily for 2 days, 2 pills daily for 2 days, 1 pill daily for 2 days.  #12 with no refills.

## 2016-10-04 NOTE — Telephone Encounter (Signed)
Rx has been sent to preferred pharmacy. Heidi Good is aware and voiced her understanding. Nothing further needed.

## 2016-10-04 NOTE — Telephone Encounter (Signed)
Spoke with pt's Mother, Arbie Cookey, who states pt has non prod cough & increased sob x2w Denies fever, chills or sweats. Pt using ventolin daily with no improvement.   VS please advise. Thanks.

## 2016-10-06 ENCOUNTER — Other Ambulatory Visit: Payer: Self-pay | Admitting: Internal Medicine

## 2016-10-15 ENCOUNTER — Telehealth: Payer: Self-pay | Admitting: Pulmonary Disease

## 2016-10-15 MED ORDER — MOMETASONE FURO-FORMOTEROL FUM 200-5 MCG/ACT IN AERO: 2.0000 | INHALATION_SPRAY | Freq: Two times a day (BID) | RESPIRATORY_TRACT | 5 refills | Status: DC

## 2016-10-15 NOTE — Telephone Encounter (Signed)
Spoke with pt's mother, aware of recs.  rx sent to preferred pharmacy.  Nothing further needed.

## 2016-10-15 NOTE — Telephone Encounter (Signed)
Spoke with pt's mother (dpr on file), states that pt c/o increased sob, nonprod cough worse with exertion X1 week.  Pt was just given pred taper on 5/25 which completely resolved cough, but cough resumed since completing pred taper.  Denies fever, chills, chest pain, nausea, vomiting.  Requesting further recs to help with cough.  Last ov: 09/25/2016 with VS Next ov: 11/21/16 with TP  [facility] on [address]   VS please advise on recs.  Thanks.

## 2016-10-15 NOTE — Telephone Encounter (Signed)
Have her change dulera to 200-5 mcg bid.

## 2016-10-17 ENCOUNTER — Ambulatory Visit (INDEPENDENT_AMBULATORY_CARE_PROVIDER_SITE_OTHER): Payer: Medicare Other | Admitting: Psychology

## 2016-10-17 ENCOUNTER — Ambulatory Visit: Payer: Medicare Other | Admitting: Psychology

## 2016-10-17 DIAGNOSIS — F411 Generalized anxiety disorder: Secondary | ICD-10-CM

## 2016-11-08 DIAGNOSIS — L820 Inflamed seborrheic keratosis: Secondary | ICD-10-CM | POA: Diagnosis not present

## 2016-11-08 DIAGNOSIS — D1801 Hemangioma of skin and subcutaneous tissue: Secondary | ICD-10-CM | POA: Diagnosis not present

## 2016-11-08 DIAGNOSIS — L821 Other seborrheic keratosis: Secondary | ICD-10-CM | POA: Diagnosis not present

## 2016-11-08 DIAGNOSIS — L918 Other hypertrophic disorders of the skin: Secondary | ICD-10-CM | POA: Diagnosis not present

## 2016-11-14 ENCOUNTER — Telehealth: Payer: Self-pay | Admitting: Internal Medicine

## 2016-11-14 MED ORDER — ESTRADIOL 1 MG PO TABS: 1.0000 mg | ORAL_TABLET | Freq: Every day | ORAL | 1 refills | Status: DC

## 2016-11-14 NOTE — Telephone Encounter (Signed)
Pt switched pharmacy please send estradiol (ESTRACE) 1 MG tablet  To [facility] on [address]

## 2016-11-14 NOTE — Telephone Encounter (Signed)
RX sent

## 2016-11-20 DIAGNOSIS — F200 Paranoid schizophrenia: Secondary | ICD-10-CM | POA: Diagnosis not present

## 2016-11-21 ENCOUNTER — Ambulatory Visit: Payer: Self-pay | Admitting: Adult Health

## 2016-11-25 ENCOUNTER — Ambulatory Visit: Payer: Self-pay | Admitting: Pulmonary Disease

## 2016-11-29 ENCOUNTER — Telehealth: Payer: Self-pay | Admitting: Gastroenterology

## 2016-11-29 ENCOUNTER — Other Ambulatory Visit: Payer: Self-pay

## 2016-11-29 MED ORDER — ONDANSETRON HCL 8 MG PO TABS: 8.0000 mg | ORAL_TABLET | Freq: Three times a day (TID) | ORAL | 1 refills | Status: DC | PRN

## 2016-11-29 NOTE — Telephone Encounter (Signed)
Spoke to patient's mother, she states that she is continuing to have nausea daily and every couple of days will have episode of vomiting. Looking at medication list, she was given zofran to help with this, it was noted that patient had stopped taking this and had taken mother's phenergan instead. I asked the mother about this, she said that patient did take her Rx of zofran, yesterday, and this helped with the nausea. Patient is scheduled for follow up visit at next available on 8/24. Patient would like to know if there is something else that can be done prior to her appointment.

## 2016-11-29 NOTE — Telephone Encounter (Signed)
She can have a prescription for zofran 8 mg , one tablet every 8 hours as needed for nausea.  We know from prior testing that she has a condition of delayed stomach emptying.  Some meds we might normally use for that cannot be used for Heidi Good because of the potential for drug interaction with her Abilify.  I am afraid I really have nothing else to offer for it.  She needs literature on a gastroparesis diet if she does not already have that.  They are welcome to come to the clinic visit in August, but I do not feel I can give more info than that or offer any other treatments.

## 2016-11-29 NOTE — Telephone Encounter (Signed)
Spoke to patient's mother, gave her Dr. Loletha Carrow' recommendations, Rx for zofran 8 mg sent to [facility] on [address] per mother. Patient did not receive information re: gastroparesis diet, will mail information to them, verbally went over recommendations too. Advised that they are welcome to follow up in clinic, but unfortunately the medication that would normally be prescribed for this has potential drug interactions with her Abilify.

## 2016-12-04 ENCOUNTER — Encounter: Payer: Self-pay | Admitting: Internal Medicine

## 2016-12-04 ENCOUNTER — Ambulatory Visit (INDEPENDENT_AMBULATORY_CARE_PROVIDER_SITE_OTHER): Payer: Medicare Other | Admitting: Internal Medicine

## 2016-12-04 DIAGNOSIS — R0789 Other chest pain: Secondary | ICD-10-CM | POA: Diagnosis not present

## 2016-12-04 DIAGNOSIS — E034 Atrophy of thyroid (acquired): Secondary | ICD-10-CM | POA: Diagnosis not present

## 2016-12-04 DIAGNOSIS — F321 Major depressive disorder, single episode, moderate: Secondary | ICD-10-CM | POA: Diagnosis not present

## 2016-12-04 MED ORDER — ASPIRIN EC 81 MG PO TBEC: 81.0000 mg | DELAYED_RELEASE_TABLET | Freq: Every day | ORAL | 3 refills | Status: AC

## 2016-12-04 NOTE — Assessment & Plan Note (Addendum)
Remote. To ER if re-occurred  ASA EKG - OK CL stress test

## 2016-12-04 NOTE — Progress Notes (Signed)
Subjective:  Patient ID: Heidi Good, female    DOB: [birth year]  Age: 56 y.o. MRN: [id number]  CC: Follow-up   HPI Candi S Curtner presents for CP in June that lasted x 1 h. NO SOB, sweats, weakness . She took ASA once No relapse.   Outpatient Medications Prior to Visit  Medication Sig Dispense Refill  . albuterol (PROVENTIL HFA;VENTOLIN HFA) 108 (90 Base) MCG/ACT inhaler Inhale 1-2 puffs into the lungs every 6 (six) hours as needed for wheezing or shortness of breath. 1 Inhaler 5  . albuterol (PROVENTIL) (2.5 MG/3ML) 0.083% nebulizer solution Take 3 mLs (2.5 mg total) by nebulization every 6 (six) hours as needed for wheezing or shortness of breath. 75 mL 12  . ALPRAZolam (XANAX) 0.5 MG tablet Take 1 tablet (0.5 mg total) by mouth at bedtime as needed for anxiety. 30 tablet 0  . ARIPiprazole (ABILIFY) 15 MG tablet Take 15 mg by mouth daily.  0  . bismuth-metronidazole-tetracycline (PYLERA) 140-125-125 MG capsule Take 3 capsules by mouth 4 (four) times daily -  before meals and at bedtime. 120 capsule 0  . estradiol (ESTRACE) 1 MG tablet Take 1 tablet (1 mg total) by mouth daily. 90 tablet 1  . fluticasone (FLONASE) 50 MCG/ACT nasal spray Place 2 sprays into both nostrils daily. 16 g 2  . KLOR-CON 8 MEQ tablet TAKE 1 TABLET BY MOUTH EVERY DAY 90 tablet 1  . levothyroxine (SYNTHROID, LEVOTHROID) 75 MCG tablet Take 1 tablet (75 mcg total) by mouth daily. 90 tablet 3  . linaclotide (LINZESS) 290 MCG CAPS capsule Take 1 capsule (290 mcg total) by mouth daily before breakfast. 30 capsule 6  . mometasone-formoterol (DULERA) 200-5 MCG/ACT AERO Inhale 2 puffs into the lungs 2 (two) times daily. 1 Inhaler 5  . montelukast (SINGULAIR) 10 MG tablet Take 1 tablet (10 mg total) by mouth at bedtime. 30 tablet 5  . NITRO-BID 2 % ointment Place 1 application rectally. As needed    . omeprazole (PRILOSEC) 40 MG capsule Take 1 capsule (40 mg total) by mouth daily. 90 capsule 3  . ondansetron (ZOFRAN) 8  MG tablet Take 1 tablet (8 mg total) by mouth every 8 (eight) hours as needed for nausea or vomiting. 30 tablet 1  . predniSONE (DELTASONE) 10 MG tablet 3 tabs x 2 days, 2 tabs x 2 days, 1 tab x 2 days then stop 12 tablet 0  . traMADol (ULTRAM) 50 MG tablet Take 1-2 tablets (50-100 mg total) by mouth 2 (two) times daily as needed for severe pain. 100 tablet 1  . Cholecalciferol (VITAMIN D3) 2000 units capsule Take 1 capsule (2,000 Units total) by mouth daily. (Patient not taking: Reported on 12/04/2016) 100 capsule 3  . lubiprostone (AMITIZA) 24 MCG capsule Take 1 capsule (24 mcg total) by mouth 2 (two) times daily with a meal. (Patient not taking: Reported on 12/04/2016) 60 capsule 0  . meloxicam (MOBIC) 15 MG tablet TAKE 1 TABLET BY MOUTH EVERY DAY (Patient not taking: Reported on 12/04/2016) 30 tablet 2   Facility-Administered Medications Prior to Visit  Medication Dose Route Frequency Provider Last Rate Last Dose  . 0.9 %  sodium chloride infusion  500 mL Intravenous Continuous Danis, Estill Cotta III, MD        ROS Review of Systems  Constitutional: Negative for activity change, appetite change, chills, fatigue and unexpected weight change.  HENT: Negative for congestion, mouth sores and sinus pressure.   Eyes: Negative for visual disturbance.  Respiratory: Negative for cough, chest tightness and wheezing.   Gastrointestinal: Negative for abdominal pain and nausea.  Genitourinary: Negative for difficulty urinating, frequency and vaginal pain.  Musculoskeletal: Negative for back pain and gait problem.  Skin: Negative for pallor and rash.  Neurological: Negative for dizziness, tremors, weakness, numbness and headaches.  Psychiatric/Behavioral: Negative for confusion, dysphoric mood, sleep disturbance and suicidal ideas.    Objective:  BP 134/80 (BP Location: Left Arm, Patient Position: Sitting, Cuff Size: Normal)   Pulse 89   Temp 98.8 F (37.1 C) (Oral)   Resp 12   Ht 5\' 5"  (1.651 m)    Wt 202 lb (91.6 kg)   SpO2 97%   BMI 33.61 kg/m   BP Readings from Last 3 Encounters:  12/04/16 134/80  09/25/16 122/78  09/11/16 130/84    Wt Readings from Last 3 Encounters:  12/04/16 202 lb (91.6 kg)  09/25/16 202 lb 6.4 oz (91.8 kg)  09/24/16 204 lb (92.5 kg)    Physical Exam  Constitutional: She appears well-developed. No distress.  HENT:  Head: Normocephalic.  Right Ear: External ear normal.  Left Ear: External ear normal.  Nose: Nose normal.  Mouth/Throat: Oropharynx is clear and moist.  Eyes: Pupils are equal, round, and reactive to light. Conjunctivae are normal. Right eye exhibits no discharge. Left eye exhibits no discharge.  Neck: Normal range of motion. Neck supple. No JVD present. No tracheal deviation present. No thyromegaly present.  Cardiovascular: Normal rate, regular rhythm and normal heart sounds.   Pulmonary/Chest: No stridor. No respiratory distress. She has no wheezes.  Abdominal: Soft. Bowel sounds are normal. She exhibits no distension and no mass. There is no tenderness. There is no rebound and no guarding.  Musculoskeletal: She exhibits no edema or tenderness.  Lymphadenopathy:    She has no cervical adenopathy.  Neurological: She displays normal reflexes. No cranial nerve deficit. She exhibits normal muscle tone. Coordination normal.  Skin: No rash noted. No erythema.  Psychiatric: She has a normal mood and affect. Her behavior is normal. Judgment and thought content normal.   Procedure: EKG Indication: chest pain, remote Impression: NSR. No changes since 2015.  Lab Results  Component Value Date   WBC 12.5 (H) 09/04/2016   HGB 15.3 (H) 09/04/2016   HCT 44.7 09/04/2016   PLT 219.0 09/04/2016   GLUCOSE 99 09/04/2016   CHOL 137 03/10/2015   TRIG 194.0 (H) 03/10/2015   HDL 43.10 03/10/2015   LDLCALC 55 03/10/2015   ALT 31 09/04/2016   AST 47 (H) 09/04/2016   NA 137 09/04/2016   K 3.7 09/04/2016   CL 100 09/04/2016   CREATININE 0.69  09/04/2016   BUN 5 (L) 09/04/2016   CO2 30 09/04/2016   TSH 0.88 09/04/2016   HGBA1C 6.4 09/04/2016    Dg Chest 2 View  Result Date: 06/25/2016 CLINICAL DATA:  Chronic worsening shortness of breath. Initial encounter. EXAM: CHEST  2 VIEW COMPARISON:  Chest radiograph performed 04/03/2016, and CT of the chest performed 04/11/2016 FINDINGS: Mild vascular congestion is noted. Increased interstitial markings may reflect minimal interstitial edema. No pleural effusion or pneumothorax is seen. The heart is borderline normal in size. No acute osseous abnormalities are identified. Osteophyte formation is again noted at the mid to lower thoracic spine. IMPRESSION: Mild vascular congestion. Increased interstitial markings may reflect minimal interstitial edema. Electronically Signed   By: Garald Balding M.D.   On: 06/25/2016 16:29    Assessment & Plan:   There are no  diagnoses linked to this encounter. I am having Ms. Mireles maintain her albuterol, ALPRAZolam, NITRO-BID, ARIPiprazole, traMADol, Vitamin D3, albuterol, lubiprostone, fluticasone, montelukast, linaclotide, bismuth-metronidazole-tetracycline, omeprazole, KLOR-CON, levothyroxine, meloxicam, predniSONE, mometasone-formoterol, estradiol, and ondansetron. We will continue to administer sodium chloride.  No orders of the defined types were placed in this encounter.    Follow-up: No Follow-up on file.  Walker Kehr, MD

## 2016-12-04 NOTE — Assessment & Plan Note (Signed)
Labs

## 2016-12-04 NOTE — Assessment & Plan Note (Signed)
Abilify

## 2016-12-11 ENCOUNTER — Telehealth (HOSPITAL_COMMUNITY): Payer: Self-pay

## 2016-12-11 NOTE — Telephone Encounter (Signed)
Encounter complete. 

## 2016-12-13 ENCOUNTER — Ambulatory Visit (HOSPITAL_COMMUNITY)
Admission: RE | Admit: 2016-12-13 | Discharge: 2016-12-13 | Disposition: A | Discharge status: Home / Self Care | Payer: Medicare Other | Source: Ambulatory Visit | Attending: Cardiovascular Disease | Admitting: Cardiovascular Disease

## 2016-12-13 DIAGNOSIS — G4733 Obstructive sleep apnea (adult) (pediatric): Secondary | ICD-10-CM | POA: Diagnosis not present

## 2016-12-13 DIAGNOSIS — R0609 Other forms of dyspnea: Secondary | ICD-10-CM | POA: Diagnosis not present

## 2016-12-13 DIAGNOSIS — Z6833 Body mass index (BMI) 33.0-33.9, adult: Secondary | ICD-10-CM | POA: Insufficient documentation

## 2016-12-13 DIAGNOSIS — R002 Palpitations: Secondary | ICD-10-CM | POA: Insufficient documentation

## 2016-12-13 DIAGNOSIS — I2510 Atherosclerotic heart disease of native coronary artery without angina pectoris: Secondary | ICD-10-CM | POA: Diagnosis not present

## 2016-12-13 DIAGNOSIS — R0789 Other chest pain: Secondary | ICD-10-CM | POA: Diagnosis not present

## 2016-12-13 DIAGNOSIS — E039 Hypothyroidism, unspecified: Secondary | ICD-10-CM | POA: Insufficient documentation

## 2016-12-13 DIAGNOSIS — Z8249 Family history of ischemic heart disease and other diseases of the circulatory system: Secondary | ICD-10-CM | POA: Diagnosis not present

## 2016-12-13 DIAGNOSIS — E669 Obesity, unspecified: Secondary | ICD-10-CM | POA: Insufficient documentation

## 2016-12-13 DIAGNOSIS — R079 Chest pain, unspecified: Secondary | ICD-10-CM | POA: Insufficient documentation

## 2016-12-13 DIAGNOSIS — R5383 Other fatigue: Secondary | ICD-10-CM | POA: Diagnosis not present

## 2016-12-13 LAB — MYOCARDIAL PERFUSION IMAGING
CHL CUP MPHR: 165 {beats}/min
CHL CUP NUCLEAR SDS: 1
CHL CUP NUCLEAR SSS: 1
CSEPED: 7 min
CSEPEW: 7.9 METS
Exercise duration (sec): 40 s
LV dias vol: 89 mL (ref 46–106)
LVSYSVOL: 40 mL
NUC STRESS TID: 1.17
Peak HR: 150 {beats}/min
Percent HR: 90 %
RPE: 18
Rest HR: 70 {beats}/min
SRS: 0

## 2016-12-13 MED ORDER — TECHNETIUM TC 99M TETROFOSMIN IV KIT
30.2000 | PACK | Freq: Once | INTRAVENOUS | Status: AC | PRN
  Administered 2016-12-13: 30.2 via INTRAVENOUS
  Filled 2016-12-13: qty 31

## 2016-12-13 MED ORDER — TECHNETIUM TC 99M TETROFOSMIN IV KIT
10.5000 | PACK | Freq: Once | INTRAVENOUS | Status: AC | PRN
  Administered 2016-12-13: 10.5 via INTRAVENOUS
  Filled 2016-12-13: qty 11

## 2016-12-15 ENCOUNTER — Telehealth: Payer: Self-pay | Admitting: Internal Medicine

## 2016-12-15 NOTE — Telephone Encounter (Signed)
Inez Catalina, Please inform the patient that her stress test was good. Thanks, AP

## 2016-12-17 NOTE — Telephone Encounter (Signed)
Pt's mother notified.

## 2017-01-03 ENCOUNTER — Ambulatory Visit (INDEPENDENT_AMBULATORY_CARE_PROVIDER_SITE_OTHER): Payer: Medicare Other | Admitting: Gastroenterology

## 2017-01-03 ENCOUNTER — Encounter: Payer: Self-pay | Admitting: Gastroenterology

## 2017-01-03 ENCOUNTER — Encounter (INDEPENDENT_AMBULATORY_CARE_PROVIDER_SITE_OTHER): Payer: Self-pay

## 2017-01-03 VITALS — BP 126/60 | HR 78 | Ht 65.0 in | Wt 202.0 lb

## 2017-01-03 DIAGNOSIS — R1115 Cyclical vomiting syndrome unrelated to migraine: Secondary | ICD-10-CM

## 2017-01-03 DIAGNOSIS — G43A0 Cyclical vomiting, not intractable: Secondary | ICD-10-CM

## 2017-01-03 DIAGNOSIS — K5909 Other constipation: Secondary | ICD-10-CM

## 2017-01-03 NOTE — Patient Instructions (Addendum)
Generic miralax powder , one capful in a glass of water or any liquid once daily.  If no improvement after a week, increase to one capful twice a day.  Please call us in a few weeks with an update on your symptoms.  If no improvement, we will move on with further testing as discussed.  If you are age 54 or older, your body mass index should be between 23-30. Your Body mass index is 26.96 kg/m. If this is out of the aforementioned range listed, please consider follow up with your Primary Care Provider.  If you are age 59 or younger, your body mass index should be between 19-25. Your Body mass index is 26.96 kg/m. If this is out of the aformentioned range listed, please consider follow up with your Primary Care Provider.    Thank you for choosing [facility]  Dr Wilfrid Lund III

## 2017-01-03 NOTE — Progress Notes (Signed)
     [facility] Progress Note  Chief Complaint: Vomiting and constipation  Subjective  History:  Shamar follows up today with the aid of a sign interpreter. She continues to have episodic vomiting occurring perhaps every month or 2. That seems to respond to Zofran. See prior notes, she had a questionable prior history of gastroparesis. I did not want her to stay on Reglan out of concern it might have an increased risk of neurologic side effects while she is on Abilify. EGD and colonoscopy earlier this year were only notable for H. pylori bacteria that cleared with Pylera, and a colonoscopy that just showed diverticulosis.  She continues to be bothered by frequent constipation and says that Linzess was no help. Curiously, Dr. Kelby Fam notes from a couple years ago indicate that she did well on Linzess 1 tablet 3 times a week. There's been no rectal bleeding. Jemeka feels as if the stool just sits down in the rectum and will not move any further. She tends to have vomiting episodes when she has severe constipation.   ROS: Cardiovascular:  no chest pain Respiratory: no dyspnea Remainder systems are negative except as above The patient's Past Medical, Family and Social History were reviewed and are on file in the EMR.  Objective:  Med list reviewed  Vital signs in last 24 hrs: Vitals:   01/03/17 1604  BP: 126/60  Pulse: 78   No weight loss  Physical Exam    HEENT: sclera anicteric, oral mucosa moist without lesions  Neck: supple, no thyromegaly, JVD or lymphadenopathy  Cardiac: RRR without murmurs, S1S2 heard, no peripheral edema  Pulm: clear to auscultation bilaterally, normal RR and effort noted  Abdomen: soft, No tenderness, with active bowel sounds. No guarding or palpable hepatosplenomegaly.  Skin; warm and dry, no jaundice or rash Rectal, with the female sign language interpreter present: Normal external, normal sphincter tone, no fissure, no palpable internal  lesions. She seems to have normal resting and voluntary sphincter tone, normal puborectalis contraction, normal rectal descent with Valsalva.  Recent Labs:  Fecal H. pylori antigen negative after Pylera treatment  @ASSESSMENTPLANBEGIN @ Assessment: Encounter Diagnoses  Name Primary?  . Chronic constipation Yes  . Non-intractable cyclical vomiting with nausea    I still suspect she is most likely to have functional constipation, pelvic floor dysfunction or dyssynergia defecation is still a consideration.   Plan: MiraLAX one capful once a day. If not improved in a week, increase to one capful twice daily. I would like her mother to communicate with [place] in a few weeks and let us know how she is. If not much improved, we will proceed with anorectal motility testing. Briefly described that to her today with the interpreter present so she will understand it in case we proceed with that.   Total time 30 minutes, over half spent in counseling and coordination of care. Extra time was required for the use of an interpreter.  Nelida Meuse III

## 2017-01-10 DIAGNOSIS — F200 Paranoid schizophrenia: Secondary | ICD-10-CM | POA: Diagnosis not present

## 2017-02-03 ENCOUNTER — Encounter: Payer: Self-pay | Admitting: Gastroenterology

## 2017-02-03 ENCOUNTER — Ambulatory Visit (INDEPENDENT_AMBULATORY_CARE_PROVIDER_SITE_OTHER): Payer: Medicare Other

## 2017-02-03 ENCOUNTER — Ambulatory Visit (INDEPENDENT_AMBULATORY_CARE_PROVIDER_SITE_OTHER): Payer: Medicare Other | Admitting: Gastroenterology

## 2017-02-03 VITALS — BP 126/76 | HR 64 | Ht 64.0 in | Wt 201.5 lb

## 2017-02-03 DIAGNOSIS — R1115 Cyclical vomiting syndrome unrelated to migraine: Secondary | ICD-10-CM

## 2017-02-03 DIAGNOSIS — K5909 Other constipation: Secondary | ICD-10-CM | POA: Diagnosis not present

## 2017-02-03 DIAGNOSIS — G43A0 Cyclical vomiting, not intractable: Secondary | ICD-10-CM

## 2017-02-03 DIAGNOSIS — K625 Hemorrhage of anus and rectum: Secondary | ICD-10-CM

## 2017-02-03 DIAGNOSIS — Z23 Encounter for immunization: Secondary | ICD-10-CM

## 2017-02-03 NOTE — Progress Notes (Signed)
[facility] Progress Note  Chief Complaint: Constipation and rectal bleeding  Subjective  History:  Heidi Good returns to see me about a month after her most recent visit. She is accompanied by her mother and a sign interpreter. She continues to have chronic nausea as before and detailed in previous notes. She has chronic bloated abdominal pain with intermittent cramps and alternating constipation and diarrhea. As near as I can tell, it seems like in the early morning she will have some urgency and need for BM that might be loose. Most of the time she has constipation and might sit on the toilet for an hour waiting for bowel movement. She will strain and feel that she only produces a small amount. For the last several days she has had anorectal pain and bleeding.  ROS: Cardiovascular:  no chest pain Respiratory: no dyspnea Chronic anxiety  The patient's Past Medical, Family and Social History were reviewed and are on file in the EMR.  Objective:  Med list reviewed  Current Outpatient Prescriptions:  .  albuterol (PROVENTIL HFA;VENTOLIN HFA) 108 (90 Base) MCG/ACT inhaler, Inhale 1-2 puffs into the lungs every 6 (six) hours as needed for wheezing or shortness of breath., Disp: 1 Inhaler, Rfl: 5 .  albuterol (PROVENTIL) (2.5 MG/3ML) 0.083% nebulizer solution, Take 3 mLs (2.5 mg total) by nebulization every 6 (six) hours as needed for wheezing or shortness of breath., Disp: 75 mL, Rfl: 12 .  ALPRAZolam (XANAX) 0.5 MG tablet, Take 1 tablet (0.5 mg total) by mouth at bedtime as needed for anxiety., Disp: 30 tablet, Rfl: 0 .  ARIPiprazole (ABILIFY) 15 MG tablet, Take 15 mg by mouth daily., Disp: , Rfl: 0 .  aspirin EC 81 MG tablet, Take 1 tablet (81 mg total) by mouth daily., Disp: 100 tablet, Rfl: 3 .  Cholecalciferol (VITAMIN D3) 2000 units capsule, Take 1 capsule (2,000 Units total) by mouth daily., Disp: 100 capsule, Rfl: 3 .  estradiol (ESTRACE) 1 MG tablet, Take 1 tablet (1 mg  total) by mouth daily., Disp: 90 tablet, Rfl: 1 .  fluticasone (FLONASE) 50 MCG/ACT nasal spray, Place 2 sprays into both nostrils daily., Disp: 16 g, Rfl: 2 .  KLOR-CON 8 MEQ tablet, TAKE 1 TABLET BY MOUTH EVERY DAY, Disp: 90 tablet, Rfl: 1 .  levothyroxine (SYNTHROID, LEVOTHROID) 75 MCG tablet, Take 1 tablet (75 mcg total) by mouth daily., Disp: 90 tablet, Rfl: 3 .  mometasone-formoterol (DULERA) 200-5 MCG/ACT AERO, Inhale 2 puffs into the lungs 2 (two) times daily., Disp: 1 Inhaler, Rfl: 5 .  montelukast (SINGULAIR) 10 MG tablet, Take 1 tablet (10 mg total) by mouth at bedtime., Disp: 30 tablet, Rfl: 5 .  NITRO-BID 2 % ointment, Place 1 application rectally. As needed, Disp: , Rfl:  .  omeprazole (PRILOSEC) 40 MG capsule, Take 1 capsule (40 mg total) by mouth daily., Disp: 90 capsule, Rfl: 3 .  ondansetron (ZOFRAN) 8 MG tablet, Take 1 tablet (8 mg total) by mouth every 8 (eight) hours as needed for nausea or vomiting., Disp: 30 tablet, Rfl: 1 .  traMADol (ULTRAM) 50 MG tablet, Take 1-2 tablets (50-100 mg total) by mouth 2 (two) times daily as needed for severe pain., Disp: 100 tablet, Rfl: 1  Current Facility-Administered Medications:  .  0.9 %  sodium chloride infusion, 500 mL, Intravenous, Continuous, Danis, Estill Cotta III, MD   Vital signs in last 24 hrs: Vitals:   02/03/17 1517  BP: 126/76  Pulse: 64  Physical Exam    HEENT: sclera anicteric, oral mucosa moist without lesions  Neck: supple, no thyromegaly, JVD or lymphadenopathy  Cardiac: RRR without murmurs, S1S2 heard, no peripheral edema  Pulm: clear to auscultation bilaterally, normal RR and effort noted  Abdomen: soft, Obese, no tenderness, with active bowel sounds. No guarding or palpable hepatosplenomegaly.  Skin; warm and dry, no jaundice or rash Rectal (mother & interpreter present in the room): Normal external, no fissure or tenderness, no palpable internal lesions, scant stool in the  rectum.   @ASSESSMENTPLANBEGIN @ Assessment: Encounter Diagnoses  Name Primary?  . Chronic constipation Yes  . Non-intractable cyclical vomiting with nausea   . Rectal bleeding    This continues to be a very challenging scenario of multiple GI symptoms with a previous history of gastroparesis as well as what appears to be functional bowel disorder. Colonoscopy was unremarkable, no hemorrhoids were seen and certainly no obstruction. As detailed in most recent office note, I wonder if she has some pelvic dyssynergia or other anorectal motility disorder. She has failed Linzess and has insufficient improvement on MiraLAX. The bleeding appears to be benign anorectal bleeding from constipation.   Plan:  Anorectal motility. This was described, and she will certainly need a sign interpreter that day for it to be a good-quality study. She feels she could undergo it, so it was scheduled.  Instructions were given for MiraLAX bowel prep to achieve a thorough evacuation and give at least some temporary relief of symptoms. She might need to do this on a regular basis.  Total time 30 minutes, over half spent in counseling and coordination of care. Extra time required for complexity of scheduled testing and need for sign interpreter.  Nelida Meuse III

## 2017-02-03 NOTE — Patient Instructions (Addendum)
Buy a 238 gram bottle of miralax powder and put it in a 64 ounce bottle of gatorade.  Drink a cup every 30-45 minutes until half of it is done.  If you need to drink the other half of the bottle the following morning in order to get further relief of constipation, you may do so.  _________________________________________________________________________________________________  If you are age 55 or older, your body mass index should be between 23-30. Your Body mass index is 34.59 kg/m. If this is out of the aforementioned range listed, please consider follow up with your Primary Care Provider.  If you are age 64 or younger, your body mass index should be between 19-25. Your Body mass index is 34.59 kg/m. If this is out of the aformentioned range listed, please consider follow up with your Primary Care Provider.   You have been scheduled to have an anorectal manometry at [facility] on Friday, October 12t at 10:30am. Please arrive 30 minutes prior to your appointment time for registration (1st floor of the hospital-admissions).  Please make certain to use 1 Fleets enema 2 hours prior to coming for your appointment. You can purchase Fleets enemas from the laxative section at your drug store. You should not eat anything 6 hours prior to the procedure. You may take regular medications with small sips of water at least 2 hours prior to the study.  Anorectal manometry is a test performed to evaluate patients with constipation or fecal incontinence. This test measures the pressures of the anal sphincter muscles, the sensation in the rectum, and the neural reflexes that are needed for normal bowel movements.  THE PROCEDURE The test takes approximately 30 minutes to 1 hour. You will be asked to change into a hospital gown. A technician or nurse will explain the procedure to you, take a brief health history, and answer any questions you may have. The patient then lies on his or her left side. A  small, flexible tube, about the size of a thermometer, with a balloon at the end is inserted into the rectum. The catheter is connected to a machine that measures the pressure. During the test, the small balloon attached to the catheter may be inflated in the rectum to assess the normal reflex pathways. The nurse or technician may also ask the person to squeeze, relax, and push at various times. The anal sphincter muscle pressures are measured during each of these maneuvers. To squeeze, the patient tightens the sphincter muscles as if trying to prevent anything from coming out. To push or bear down, the patient strains down as if trying to have a bowel movement.

## 2017-02-19 ENCOUNTER — Other Ambulatory Visit: Payer: Self-pay | Admitting: Internal Medicine

## 2017-02-19 DIAGNOSIS — Z1231 Encounter for screening mammogram for malignant neoplasm of breast: Secondary | ICD-10-CM

## 2017-02-20 ENCOUNTER — Ambulatory Visit: Payer: Self-pay | Admitting: Family Medicine

## 2017-02-21 ENCOUNTER — Encounter (HOSPITAL_COMMUNITY): Admission: RE | Payer: Self-pay | Source: Ambulatory Visit

## 2017-02-21 ENCOUNTER — Ambulatory Visit (HOSPITAL_COMMUNITY): Admission: RE | Admit: 2017-02-21 | Payer: Medicare Other | Source: Ambulatory Visit | Admitting: Gastroenterology

## 2017-02-21 SURGERY — MANOMETRY, ANORECTAL

## 2017-02-24 ENCOUNTER — Telehealth: Payer: Self-pay | Admitting: Gastroenterology

## 2017-02-24 NOTE — Telephone Encounter (Signed)
Spoke to patient's mother to let her know that the anorectal manometry is rescheduled at [facility] for 03/05/17. Prep instructions mailed to patient today.

## 2017-02-25 ENCOUNTER — Telehealth: Payer: Self-pay

## 2017-02-25 ENCOUNTER — Ambulatory Visit (INDEPENDENT_AMBULATORY_CARE_PROVIDER_SITE_OTHER): Payer: Medicare Other | Admitting: Family Medicine

## 2017-02-25 VITALS — BP 134/78 | HR 70 | Temp 98.6°F | Ht 64.0 in | Wt 202.0 lb

## 2017-02-25 DIAGNOSIS — M722 Plantar fascial fibromatosis: Secondary | ICD-10-CM | POA: Diagnosis not present

## 2017-02-25 MED ORDER — MELOXICAM 15 MG PO TABS: 15.0000 mg | ORAL_TABLET | Freq: Every day | ORAL | 0 refills | Status: DC

## 2017-02-25 NOTE — Patient Instructions (Addendum)
Thank you for coming in,   Please try over the counter orthotics from [employer].   Please try the exercises. If there is no improvement with these modalities then we can try an injection or physical therapy.   Please feel free to call with any questions or concerns at any time, at [phone]. --Dr. Raeford Razor

## 2017-02-25 NOTE — Telephone Encounter (Signed)
I have tried numerous times to contact patient's mother since yesterday, phone kept ringing busy. Called mother's cell number, which is her sister's phone. Left message to have patient's mother call our office. I needed to let her know that the hospital had to reschedule Heidi Good's test to 03/07/17 at 8:30 am arrive at [facility] at 8:00 am. Prep instructions were mailed out yesterday with adjusted date and time.

## 2017-02-25 NOTE — Telephone Encounter (Signed)
Pt's mother is returning your call [phone]

## 2017-02-25 NOTE — Progress Notes (Signed)
Heidi Good - 56 y.o. female MRN [id number]  Date of birth: 08-04-1960  SUBJECTIVE:  Including CC & ROS.  Chief Complaint  Patient presents with  . Pain    bilateral foot pain(arch to heel)-x 2 weeks and it gets worse to where its getting hard to walk    Heidi Good is a 56 yo F that is presenting with left plantar foot pain. The pain has been ongoing for 2 weeks. The pain is occurring on the plantar heel of her foot. The pain is severe in nature. Pain is sharp and abdomen. She is not had a prior surgeries. Denies any injury. The pain is worse with walking. Pain is worse in the morning. It is localized to her foot. Having some swelling on the tarsal tunnel. No bruising appreciated.  Has a history of a bunionectomy on the left foot.   Interview was conducted with use of a sign language interpreter  Review of Systems  Constitutional: Negative for fever.  Musculoskeletal: Positive for gait problem. Negative for joint swelling.  Skin: Negative for color change.  Neurological: Negative for weakness and numbness.    HISTORY: Past Medical, Surgical, Social, and Family History Reviewed & Updated per EMR.   Pertinent Historical Findings include:  Past Medical History:  Diagnosis Date  . Anxiety   . Constipation   . Deafness   . Depression   . Gastroparesis   . Hyperlipemia   . Hypothyroidism   . Type II or unspecified type diabetes mellitus without mention of complication, not stated as uncontrolled   . Unspecified constipation 07/17/2013    Past Surgical History:  Procedure Laterality Date  . ABDOMINAL HYSTERECTOMY      Allergies  Allergen Reactions  . Doxycycline Nausea And Vomiting  . Hydrocodone     Nausea from cough syrup  . Other     Dog, cat, nuts, grass, trees    Family History  Problem Relation Age of Onset  . Diabetes Mother   . Colon polyps Mother        over 150 polyps detected  . Heart disease Father   . Multiple myeloma Father   . Emphysema Father   .  Heart attack Father   . Liver cancer Maternal Grandmother        not primary source, spread to her liver  . Heart attack Maternal Grandfather   . Emphysema Paternal Grandfather   . Colon cancer Neg Hx   . Esophageal cancer Neg Hx   . Rectal cancer Neg Hx   . Stomach cancer Neg Hx      Social History   Social History  . Marital status: Divorced    Spouse name: N/A  . Number of children: 0  . Years of education: N/A   Occupational History  . disablied Retired   Social History Main Topics  . Smoking status: Never Smoker  . Smokeless tobacco: Never Used  . Alcohol use No  . Drug use: No  . Sexual activity: Not on file   Other Topics Concern  . Not on file   Social History Narrative  . No narrative on file     PHYSICAL EXAM:  VS: BP 134/78 (BP Location: Left Arm, Patient Position: Sitting, Cuff Size: Large)   Pulse 70   Temp 98.6 F (37 C) (Oral)   Ht _0  (1.626 m)   Wt 202 lb (91.6 kg)   SpO2 98%   BMI 34.67 kg/m  Physical Exam Gen: NAD, alert,  cooperative with exam, well-appearing ENT: normal lips, normal nasal mucosa,  Eye: normal EOM, normal conjunctiva and lids CV:  no edema, +2 pedal pulses   Resp: no accessory muscle use, non-labored,  GI: no masses or tenderness, no hernia  Skin: no rashes, no areas of induration  Neuro: normal tone, normal sensation to touch Psych:  normal insight, alert and oriented MSK:  Left foot: Normal ankle range of motion. Swelling of the tarsal tunnel. Tenderness to palpation over the medial calcaneus. Limited dorsiflexion compared to the right. No tenderness to palpation of the Achilles.   Limited ultrasound: Left foot:  Normal findings within the tarsal tunnel. No subluxation of the posterior tibial tendon Plantar fascia appears to be thickened and swollen  Summary: Findings consistent with plantar fasciitis  Ultrasound and interpretation by Heidi Coots, MD       ASSESSMENT & PLAN:   Plantar  fasciitis Findings on exam and [place] consistent with PF.  - Meloxicam provided - Counseled on home exercises and icing - Can try a midfoot arch strap - If no improvement contrast physical therapy versus injection

## 2017-02-25 NOTE — Telephone Encounter (Signed)
Spoke to Heidi Good to let her know about the date change for the test.

## 2017-02-26 DIAGNOSIS — M722 Plantar fascial fibromatosis: Secondary | ICD-10-CM | POA: Insufficient documentation

## 2017-02-26 NOTE — Assessment & Plan Note (Signed)
Findings on exam and [place] consistent with PF.  - Meloxicam provided - Counseled on home exercises and icing - Can try a midfoot arch strap - If no improvement contrast physical therapy versus injection

## 2017-03-04 ENCOUNTER — Ambulatory Visit
Admission: RE | Admit: 2017-03-04 | Discharge: 2017-03-04 | Disposition: A | Discharge status: Home / Self Care | Payer: Medicare Other | Source: Ambulatory Visit | Attending: Internal Medicine | Admitting: Internal Medicine

## 2017-03-04 DIAGNOSIS — Z1231 Encounter for screening mammogram for malignant neoplasm of breast: Secondary | ICD-10-CM | POA: Diagnosis not present

## 2017-03-07 ENCOUNTER — Ambulatory Visit (HOSPITAL_COMMUNITY)
Admission: RE | Admit: 2017-03-07 | Discharge: 2017-03-07 | Disposition: A | Discharge status: Home / Self Care | Payer: Medicare Other | Source: Ambulatory Visit | Attending: Gastroenterology | Admitting: Gastroenterology

## 2017-03-07 ENCOUNTER — Encounter (HOSPITAL_COMMUNITY): Payer: Self-pay | Admitting: Gastroenterology

## 2017-03-07 ENCOUNTER — Encounter (HOSPITAL_COMMUNITY)
Admission: RE | Disposition: A | Discharge status: Home / Self Care | Payer: Self-pay | Source: Ambulatory Visit | Attending: Gastroenterology

## 2017-03-07 DIAGNOSIS — K5900 Constipation, unspecified: Secondary | ICD-10-CM | POA: Diagnosis not present

## 2017-03-07 DIAGNOSIS — K5902 Outlet dysfunction constipation: Secondary | ICD-10-CM

## 2017-03-07 HISTORY — PX: ANAL RECTAL MANOMETRY: SHX6358

## 2017-03-07 SURGERY — MANOMETRY, ANORECTAL

## 2017-03-07 MED ORDER — FLEET ENEMA 7-19 GM/118ML RE ENEM: 1.0000 | ENEMA | Freq: Once | RECTAL | Status: DC

## 2017-03-07 MED ORDER — FLEET ENEMA 7-19 GM/118ML RE ENEM
ENEMA | RECTAL | Status: AC
  Filled 2017-03-07: qty 1

## 2017-03-07 NOTE — Progress Notes (Signed)
Patient hearing impaired.  Mom at bedside during procedure.  Patient and Mom stated enema done at home with no results.  Mom said she didn't think patient understood how to do enema.  Spoke with Dr. Woodward Ku nurse, as Dr. Silverio Decamp was unavailable.  Dr. Havery Moros gave verbal order for fleets enema once.  Enema given with results.  Anorectal manometry done per protocol, without complications.  Patient tolerated procedure well.  Dr. Silverio Decamp to be notified today of study.    Mardee Postin, RN

## 2017-03-14 ENCOUNTER — Telehealth: Payer: Self-pay

## 2017-03-14 ENCOUNTER — Other Ambulatory Visit: Payer: Self-pay

## 2017-03-14 DIAGNOSIS — K5902 Outlet dysfunction constipation: Secondary | ICD-10-CM

## 2017-03-14 DIAGNOSIS — K5901 Slow transit constipation: Secondary | ICD-10-CM

## 2017-03-14 NOTE — Telephone Encounter (Signed)
Contacted the patient's mother with the results. She will explain the test results. Referral to PT for biofeedback and pelvic floor PT

## 2017-03-20 ENCOUNTER — Ambulatory Visit: Payer: Medicare Other | Attending: Gastroenterology | Admitting: Physical Therapy

## 2017-03-20 DIAGNOSIS — R278 Other lack of coordination: Secondary | ICD-10-CM | POA: Diagnosis not present

## 2017-03-20 DIAGNOSIS — M62838 Other muscle spasm: Secondary | ICD-10-CM | POA: Insufficient documentation

## 2017-03-20 DIAGNOSIS — M6281 Muscle weakness (generalized): Secondary | ICD-10-CM | POA: Diagnosis not present

## 2017-03-20 NOTE — Patient Instructions (Signed)
Toileting Techniques for Bowel Movements (Defecation) Using your belly (abdomen) and pelvic floor muscles to have a bowel movement is usually instinctive.  Sometimes people can have problems with these muscles and have to relearn proper defecation (emptying) techniques.  If you have weakness in your muscles, organs that are falling out, decreased sensation in your pelvis, or ignore your urge to go, you may find yourself straining to have a bowel movement.  You are straining if you are: . holding your breath or taking in a huge gulp of air and holding it  . keeping your lips and jaw tensed and closed tightly . turning red in the face because of excessive pushing or forcing . developing or worsening your  hemorrhoids . getting faint while pushing . not emptying completely and have to defecate many times a day  If you are straining, you are actually making it harder for yourself to have a bowel movement.  Many people find they are pulling up with the pelvic floor muscles and closing off instead of opening the anus. Due to lack pelvic floor relaxation and coordination the abdominal muscles, one has to work harder to push the feces out.  Many people have never been taught how to defecate efficiently and effectively.  Notice what happens to your body when you are having a bowel movement.  While you are sitting on the toilet pay attention to the following areas: . Jaw and mouth position . Angle of your hips   . Whether your feet touch the ground or not . Arm placement  . Spine position . Waist . Belly tension . Anus (opening of the anal canal)  An Evacuation/Defecation Plan   Here are the 4 basic points:  1. Lean forward enough for your elbows to rest on your knees 2. Support your feet on the floor or use a low stool if your feet don't touch the floor  3. Push out your belly as if you have swallowed a beach ball-you should feel a widening of your waist 4. Open and relax your pelvic floor muscles,  rather than tightening around the anus      The following conditions my require modifications to your toileting posture:  . If you have had surgery in the past that limits your back, hip, pelvic, knee or ankle flexibility . Constipation   Your healthcare practitioner may make the following additional suggestions and adjustments:  1) Sit on the toilet  a) Make sure your feet are supported. b) Notice your hip angle and spine position-most people find it effective to lean forward or raise their knees, which can help the muscles around the anus to relax  c) When you lean forward, place your forearms on your thighs for support  2) Relax suggestions a) Breath deeply in through your nose and out slowly through your mouth as if you are smelling the flowers and blowing out the candles. b) To become aware of how to relax your muscles, contracting and releasing muscles can be helpful.  Pull your pelvic floor muscles in tightly by using the image of holding back gas, or closing around the anus (visualize making a circle smaller) and lifting the anus up and in.  Then release the muscles and your anus should drop down and feel open. Repeat 5 times ending with the feeling of relaxation. c) Keep your pelvic floor muscles relaxed; let your belly bulge out. d) The digestive tract starts at the mouth and ends at the anal opening, so be   sure to relax both ends of the tube.  Place your tongue on the roof of your mouth with your teeth separated.  This helps relax your mouth and will help to relax the anus at the same time.  3) Empty (defecation) a) Keep your pelvic floor and sphincter relaxed, then bulge your anal muscles.  Make the anal opening wide.  b) Stick your belly out as if you have swallowed a beach ball. c) Make your belly wall hard using your belly muscles while continuing to breathe. Doing this makes it easier to open your anus. d) Breath out and give a grunt (or try using other sounds such as  ahhhh, shhhhh, ohhhh or grrrrrrr).  4) Finish a) As you finish your bowel movement, pull the pelvic floor muscles up and in.  This will leave your anus in the proper place rather than remaining pushed out and down. If you leave your anus pushed out and down, it will start to feel as though that is normal and give you incorrect signals about needing to have a bowel movement.   Zannie Cove, PT, DPT [facility] [address], [place] [address]   This meditation is from [facility], physical therapist and yoga instructor.  Patients, yoga students and pelvic health practitioners that I have shared this with have seemed to really enjoy it, find it easy to practice and teach, and report its usefulness! It involves 6 stages, using the acronym "AIRBAG" to help you remember! If you are sitting on the toilet, it is a good idea to place your feet up on some blocks so that your knees are slightly higher than your hips. This position can help enhance your PFMs to relax and allow for proper elimination, particularly for bowel movements. If you are standing during urination, these toilet meditation stages of "AIRBAG" can still be performed: A = Awareness: become present and connected to your body as best as you can. A brief body scan from head to toe simply observing sensations that you may be experiencing, without judgement, both internally (interoceptive awareness) and externally (such as the sensations at the soles of the feet weight bearing on the supporting surface, or sensations at the thighs as they weight bear on the toilet seat, or how you are holding your arms, or any tension in the jaw or shoulders). You may include awareness of thoughts and emotions, without elaborating on a story or analyzing. I = Imagination: use your visualization skills to imagine your pelvic floor and the general area of attachments of the PFMs to the inside of the front, sides and back of  the pelvis, the tailbone and sacrum. Visualize where the bladder and bowel are positioned and imagine them emptying and that the PFMs spanning across the pelvic floor are healthy and functioning optimally. R = Release & Relax: let go of any tension in the PFMs as best as you can. Releasing and relaxing these muscles can sometimes be difficult for a variety of reasons. Sometimes 'trying too hard' to relax and let go creates even more tension. Be patient and compassionate towards yourself if you have trouble with this. Letting go often takes courage, trust, concentration and practise. B = Breathe: allow your natural breath pattern to emerge. Sometimes when we try to breathe, we create more tension that results in unnatural patterns that do not serve a relaxed state. As you quietly inhale, the belly will naturally protrude outwards or forward and the pelvic floor will  descend. As you exhale, the belly and PFMs will return to their resting positions. During toileting, see if you can simply allow the quiet rhythm of the abdomino-pelvic diaphragmatic breath to happen on its own without trying to change it. A = Allow: this 'allowing' stage is a little more than just releasing and relaxing or allowing the breath to happen on its own. See if you can really give yourself permission to trust that your body knows what to do and when to do it. Perhaps you feel the need to push gently (do not strain) or you feel like you want to take a deep breath, sigh out loud, lean forward, or place your feet in a different position. The more refined your awareness skills are, the more you can trust what feels right, and not always what you think you should do. G = Gratitude: I think it is a healthy practice to not only be completely present and mindful when toileting, but to also honour this sophisticated and truly complex function that our body does for [place] on a daily basis without [place] even asking it to. So each time you complete your  toileting event, I invite you to send a little gratitude to your body and all its incredibly phenomenal parts to end your toilet meditation ! There is a FREE bonus feature of the full guided Toilet Meditation (with music and breathtaking cinematography) included along with the "Creating Pelvic Floor Health with PhysioYoga" practice sessions which consist of a combination of physical therapy based exercises and yoga methods targeted to optimize pelvic floor health. I also have a free brief segment of the meditation on my YouTube channel.

## 2017-03-21 ENCOUNTER — Encounter: Payer: Self-pay | Admitting: Physical Therapy

## 2017-03-21 NOTE — Therapy (Addendum)
[facility] [address] [place], [place], [address] Phone: [phone]   Fax:  [phone]  Physical Therapy Evaluation  Patient Details  Name: Heidi Good MRN: [id number] Date of Birth: 1960/12/16 Referring Provider: Mauri Pole    Encounter Date: 03/20/2017  PT End of Session - 03/21/17 0734    Visit Number  1    Number of Visits  10    Date for PT Re-Evaluation  05/16/17    PT Start Time  3329    PT Stop Time  1530    PT Time Calculation (min)  42 min    Activity Tolerance  Patient tolerated treatment well    Behavior During Therapy  [facility] for tasks assessed/performed       Past Medical History:  Diagnosis Date  . Anxiety   . Constipation   . Deafness   . Depression   . Gastroparesis   . Hyperlipemia   . Hypothyroidism   . Type II or unspecified type diabetes mellitus without mention of complication, not stated as uncontrolled   . Unspecified constipation 07/17/2013    Past Surgical History:  Procedure Laterality Date  . ABDOMINAL HYSTERECTOMY      There were no vitals filed for this visit.   Subjective Assessment - 03/20/17 1454    Subjective  I have been having bowel movements every day but I have to pull it out with toilet paper.  Pt denies pain.  Pt reports strong cramps trying to get bowel out.  She states this began several months ago    Patient is accompained by:  Interpreter    Pertinent History  abdominal hysterectomy 1989, deaf    Limitations  Other (comment)    How long can you walk comfortably?  walking 30 min every day    Patient Stated Goals  Have regular bowel movement    Currently in Pain?  No/denies         [facility] PT Assessment - 03/21/17 0001      Assessment   Medical Diagnosis  K59.01 (ICD-10-CM) - Dyssynergic constipation    Referring Provider  NANDIGAM, KAVITHA V     Onset Date/Surgical Date  -- 3 months ago    Prior Therapy  No      Precautions    Precautions  None      Restrictions   Weight Bearing Restrictions  No      Balance Screen   Has the patient fallen in the past 6 months  No      [place] residence    Living Arrangements  Parent lives with mother who she assists in caregiving      Prior Function   Level of [place]  Retired    Leisure  walking, exercise at home      Cognition   Overall Cognitive Status  Within Functional Limits for tasks assessed      Posture/Postural Control   Posture/Postural Control  Postural limitations    Postural Limitations  Rounded Shoulders;Anterior pelvic tilt      Strength   Left Hip External Rotation  4-/5    Left Hip Internal Rotation  4-/5    Left Hip ABduction  4-/5    Left Hip ADduction  4-/5      Palpation   Palpation comment  left hip glute med and max      Ambulation/Gait   Gait  Pattern  Within Functional Limits             Objective measurements completed on examination: See above findings.    Pelvic Floor Special Questions - 03/21/17 0001    Prior Pelvic/Prostate Exam  Yes    Date of Last Pelvic/Prostate Exam  -- 10 years ago    Currently Sexually Active  No    Urinary Leakage  No    Urinary frequency  10x day nocturia 1x    Fecal incontinence  No    Fluid intake  5 glass water, soda and sweet tea 3 glass    Skin Integrity  Intact    Prolapse  None    Pelvic Floor Internal Exam  pt informed and consent to perform internal assessment was given verbally    Exam Type  Rectal    Palpation  tight puborectalis, left obdurator internus, levators and internal external sphincter are elevated tone, fecal matter in rectum    Strength  fair squeeze, definite lift    Strength # of reps  5    Strength # of seconds  5       [facility] Adult PT Treatment/Exercise - 03/21/17 0001      Self-Care   Self-Care  Other Self-Care Comments    Other Self-Care Comments   toilet techniques and meditation              PT Education - 03/21/17 0733    Education provided  Yes    Education Details  toileting techniques and toilet meditation    Person(s) Educated  Patient;Other (comment) interpreter    Methods  Explanation;Handout    Comprehension  Verbalized understanding       PT Short Term Goals - 03/21/17 0747      PT SHORT TERM GOAL #1   Title  pt will be ind with toileting techniques    Time  4    Period  Weeks    Status  New    Target Date  04/18/17      PT SHORT TERM GOAL #2   Title  pt will be able to have increased diameter of stool due to improved relaxation of pelvic floor    Time  4    Period  Weeks    Status  New    Target Date  04/18/17        PT Long Term Goals - 03/20/17 1503      PT LONG TERM GOAL #1   Title  Pt reports feeling like she is able to completely empty bowels and only have 1-2 BM/day    Time  8    Period  Weeks    Status  New    Target Date  05/16/17      PT LONG TERM GOAL #2   Title  pt will have quarter size diameter of bowel due to improved muscle relaxation    Time  8    Period  Weeks    Status  New    Target Date  05/16/17      PT LONG TERM GOAL #3   Title  pt will have left hip MMT 4+/5 abduction for improved functional activities without having to overuse posterior pelvic floor    Time  8    Period  Weeks    Status  New    Target Date  05/16/17      PT LONG TERM GOAL #4   Title  pt will report 50% less  pain during BM due to improved muscle coordination    Time  8    Period  Weeks    Status  New    Target Date  05/16/17             Plan - 03/21/17 0735    Clinical Impression Statement  Patient presents to clinic due to dyssynergic constipation.  She has had difficulty and needing manual assistance to have BM for the last 3 months.  She reports normal stool consistency.  Pt has weakkness of abdominals and left hip.  Pt has pain with MMT of left hip and tender to palpation of puborectalis and obdurator internus.  Pt  is unable to evacuate finger from rectum during manual assessment due to lack of muscle coordination.  Pt also has urninray and bowel frequency.  Pt will benefit from skilled PT to address impairments and improve ability to have greater self care and increase ability to care for her mother.    History and Personal Factors relevant to plan of care:  excess abdominal weight, history of abdominal hysterectomy    Clinical Presentation  Stable    Clinical Presentation due to:  pt is stable    Clinical Decision Making  Low    Rehab Potential  Good    PT Frequency  1x / week    PT Duration  8 weeks    PT Treatment/Interventions  ADLs/Self Care Home Management;Biofeedback;Cryotherapy;Electrical Stimulation;Moist Heat;Ultrasound;Traction;Functional mobility training;Therapeutic activities;Therapeutic exercise;Patient/family education;Neuromuscular re-education;Balance training;Manual techniques;Scar mobilization;Passive range of motion;Dry needling;Taping    PT Next Visit Plan  assess abdominal scar, ab massage, review toileting technique, stretches, biofeedback    PT Home Exercise Plan  add stretches and abdominal massage    Recommended Other Services  n/a    Consulted and Agree with Plan of Care  Patient       Patient will benefit from skilled therapeutic intervention in order to improve the following deficits and impairments:  Decreased cognition, Pain, Decreased strength, Increased muscle spasms  Visit Diagnosis: No diagnosis found.  Self care: clinical assessment Current CJ Goal CI  Problem List Patient Active Problem List   Diagnosis Date Noted  . Plantar fasciitis 02/26/2017  . Chest pain, atypical 12/04/2016  . Snoring 09/25/2016  . Arthralgia 09/04/2016  . GERD (gastroesophageal reflux disease) 04/03/2016  . Fatigue 08/02/2015  . Acute sinusitis 08/02/2015  . Hip pain, bilateral 06/01/2015  . Prediabetes 03/08/2015  . Hyperlipidemia 03/08/2015  . Constipation 07/17/2013  .  Seasonal and perennial allergic rhinitis 08/28/2011  . Asthma, mild intermittent 08/28/2011  . Obstructive sleep apnea 08/28/2011  . Gastroparesis 04/25/2010  . IBS 08/16/2008  . Hypothyroidism 07/06/2008  . Schizoaffective disorder ([place]) 07/06/2008  . Major depressive disorder, single episode, moderate ([place]) 07/06/2008  . Hearing loss 07/06/2008    Zannie Cove, PT 03/21/2017, 11:04 AM  [facility] [address], [place], [place], [address] Phone: [phone]   Fax:  [phone]  Name: Heidi Good MRN: [id number] Date of Birth: 1960-11-24

## 2017-03-31 ENCOUNTER — Encounter: Payer: Self-pay | Admitting: Physical Therapy

## 2017-04-07 ENCOUNTER — Ambulatory Visit (INDEPENDENT_AMBULATORY_CARE_PROVIDER_SITE_OTHER): Payer: Medicare Other | Admitting: Internal Medicine

## 2017-04-07 ENCOUNTER — Encounter: Payer: Self-pay | Admitting: Internal Medicine

## 2017-04-07 DIAGNOSIS — M79672 Pain in left foot: Secondary | ICD-10-CM | POA: Insufficient documentation

## 2017-04-07 DIAGNOSIS — R7303 Prediabetes: Secondary | ICD-10-CM

## 2017-04-07 DIAGNOSIS — E034 Atrophy of thyroid (acquired): Secondary | ICD-10-CM | POA: Diagnosis not present

## 2017-04-07 DIAGNOSIS — R202 Paresthesia of skin: Secondary | ICD-10-CM

## 2017-04-07 MED ORDER — POTASSIUM CHLORIDE ER 8 MEQ PO TBCR: 8.0000 meq | EXTENDED_RELEASE_TABLET | Freq: Every day | ORAL | 3 refills | Status: DC

## 2017-04-07 MED ORDER — POTASSIUM CHLORIDE ER 8 MEQ PO TBCR: 8.0000 meq | EXTENDED_RELEASE_TABLET | Freq: Every day | ORAL | 1 refills | Status: DC

## 2017-04-07 NOTE — Progress Notes (Signed)
Subjective:  Patient ID: Heidi Good, female    DOB: [birth year]  Age: 56 y.o. MRN: [id number]  CC: No chief complaint on file.   HPI Heidi Good [facility] presents for URI x 1 week - much better; low K chronichally Lost wt on diet F/u asthma, anxiety, GERD f/u C/o L foot: cold, tingly x weeks. H/o foot surgery L  Outpatient Medications Prior to Visit  Medication Sig Dispense Refill  . albuterol (PROVENTIL HFA;VENTOLIN HFA) 108 (90 Base) MCG/ACT inhaler Inhale 1-2 puffs into the lungs every 6 (six) hours as needed for wheezing or shortness of breath. 1 Inhaler 5  . albuterol (PROVENTIL) (2.5 MG/3ML) 0.083% nebulizer solution Take 3 mLs (2.5 mg total) by nebulization every 6 (six) hours as needed for wheezing or shortness of breath. 75 mL 12  . ALPRAZolam (XANAX) 0.5 MG tablet Take 1 tablet (0.5 mg total) by mouth at bedtime as needed for anxiety. 30 tablet 0  . ARIPiprazole (ABILIFY) 15 MG tablet Take 15 mg by mouth daily.  0  . aspirin EC 81 MG tablet Take 1 tablet (81 mg total) by mouth daily. 100 tablet 3  . estradiol (ESTRACE) 1 MG tablet Take 1 tablet (1 mg total) by mouth daily. 90 tablet 1  . fluticasone (FLONASE) 50 MCG/ACT nasal spray Place 2 sprays into both nostrils daily. 16 g 2  . levothyroxine (SYNTHROID, LEVOTHROID) 75 MCG tablet Take 1 tablet (75 mcg total) by mouth daily. 90 tablet 3  . meloxicam (MOBIC) 15 MG tablet Take 1 tablet (15 mg total) by mouth daily. 30 tablet 0  . mometasone-formoterol (DULERA) 200-5 MCG/ACT AERO Inhale 2 puffs into the lungs 2 (two) times daily. 1 Inhaler 5  . montelukast (SINGULAIR) 10 MG tablet Take 1 tablet (10 mg total) by mouth at bedtime. 30 tablet 5  . NITRO-BID 2 % ointment Place 1 application rectally. As needed    . omeprazole (PRILOSEC) 40 MG capsule Take 1 capsule (40 mg total) by mouth daily. 90 capsule 3  . ondansetron (ZOFRAN) 8 MG tablet Take 1 tablet (8 mg total) by mouth every 8 (eight) hours as needed for nausea or vomiting. 30  tablet 1  . traMADol (ULTRAM) 50 MG tablet Take 1-2 tablets (50-100 mg total) by mouth 2 (two) times daily as needed for severe pain. 100 tablet 1  . KLOR-CON 8 MEQ tablet TAKE 1 TABLET BY MOUTH EVERY DAY 90 tablet 1   Facility-Administered Medications Prior to Visit  Medication Dose Route Frequency Provider Last Rate Last Dose  . 0.9 %  sodium chloride infusion  500 mL Intravenous Continuous Danis, Estill Cotta III, MD        ROS Review of Systems  Constitutional: Negative for activity change, appetite change, chills, fatigue and unexpected weight change.  HENT: Positive for hearing loss. Negative for congestion, mouth sores and sinus pressure.   Eyes: Negative for visual disturbance.  Respiratory: Negative for cough and chest tightness.   Gastrointestinal: Negative for abdominal pain and nausea.  Genitourinary: Negative for difficulty urinating, frequency and vaginal pain.  Musculoskeletal: Negative for back pain and gait problem.  Skin: Negative for pallor and rash.  Neurological: Negative for dizziness, tremors, weakness, numbness and headaches.  Psychiatric/Behavioral: Negative for confusion and sleep disturbance.    Objective:  BP 138/70 (BP Location: Left Arm, Patient Position: Sitting, Cuff Size: Large)   Pulse 91   Temp 99.8 F (37.7 C) (Oral)   Ht 5\' 4"  (1.626 m)   Wt  197 lb (89.4 kg)   SpO2 98%   BMI 33.81 kg/m   BP Readings from Last 3 Encounters:  04/07/17 138/70  02/25/17 134/78  02/03/17 126/76    Wt Readings from Last 3 Encounters:  04/07/17 197 lb (89.4 kg)  02/25/17 202 lb (91.6 kg)  02/03/17 201 lb 8 oz (91.4 kg)    Physical Exam  Constitutional: She appears well-developed. No distress.  HENT:  Head: Normocephalic.  Right Ear: External ear normal.  Left Ear: External ear normal.  Nose: Nose normal.  Mouth/Throat: Oropharynx is clear and moist.  Eyes: Conjunctivae are normal. Pupils are equal, round, and reactive to light. Right eye exhibits no  discharge. Left eye exhibits no discharge.  Neck: Normal range of motion. Neck supple. No JVD present. No tracheal deviation present. No thyromegaly present.  Cardiovascular: Normal rate, regular rhythm and normal heart sounds.  Pulmonary/Chest: No stridor. No respiratory distress. She has no wheezes.  Abdominal: Soft. Bowel sounds are normal. She exhibits no distension and no mass. There is no tenderness. There is no rebound and no guarding.  Musculoskeletal: She exhibits no edema or tenderness.  Lymphadenopathy:    She has no cervical adenopathy.  Neurological: She displays normal reflexes. No cranial nerve deficit. She exhibits normal muscle tone. Coordination normal.  Skin: No rash noted. No erythema.  Psychiatric: She has a normal mood and affect. Her behavior is normal. Judgment and thought content normal.  rough skin B dorsal feet L foot - scar  Lab Results  Component Value Date   WBC 12.5 (H) 09/04/2016   HGB 15.3 (H) 09/04/2016   HCT 44.7 09/04/2016   PLT 219.0 09/04/2016   GLUCOSE 99 09/04/2016   CHOL 137 03/10/2015   TRIG 194.0 (H) 03/10/2015   HDL 43.10 03/10/2015   LDLCALC 55 03/10/2015   ALT 31 09/04/2016   AST 47 (H) 09/04/2016   NA 137 09/04/2016   K 3.7 09/04/2016   CL 100 09/04/2016   CREATININE 0.69 09/04/2016   BUN 5 (L) 09/04/2016   CO2 30 09/04/2016   TSH 0.88 09/04/2016   HGBA1C 6.4 09/04/2016    Mm Screening Breast Tomo Bilateral  Result Date: 03/04/2017 CLINICAL DATA:  Screening. EXAM: 2D DIGITAL SCREENING BILATERAL MAMMOGRAM WITH CAD AND ADJUNCT TOMO COMPARISON:  Previous exam(s). ACR Breast Density Category c: The breast tissue is heterogeneously dense, which may obscure small masses. FINDINGS: There are no findings suspicious for malignancy. Images were processed with CAD. IMPRESSION: No mammographic evidence of malignancy. A result letter of this screening mammogram will be mailed directly to the patient. RECOMMENDATION: Screening mammogram in one  year. (Code:SM-B-01Y) BI-RADS CATEGORY  1: Negative. Electronically Signed   By: Heidi Good M.D   On: 03/04/2017 15:28    Assessment & Plan:   There are no diagnoses linked to this encounter. I have changed Judie Grieve. Purpura's KLOR-CON to potassium chloride. I am also having her maintain her albuterol, ALPRAZolam, NITRO-BID, ARIPiprazole, traMADol, albuterol, fluticasone, montelukast, omeprazole, levothyroxine, mometasone-formoterol, estradiol, ondansetron, aspirin EC, and meloxicam. We will continue to administer sodium chloride.  Meds ordered this encounter  Medications  . potassium chloride (KLOR-CON) 8 MEQ tablet    Sig: Take 1 tablet (8 mEq total) by mouth daily.    Dispense:  90 tablet    Refill:  1     Follow-up: No Follow-up on file.  Walker Kehr, MD

## 2017-04-07 NOTE — Assessment & Plan Note (Signed)
Labs Podiatry ref 

## 2017-04-07 NOTE — Assessment & Plan Note (Signed)
Labs

## 2017-04-07 NOTE — Patient Instructions (Signed)
Try GLYTONE exfoliating body lotion by Pierre Fabre (free acid value 17.5)  

## 2017-04-07 NOTE — Assessment & Plan Note (Signed)
Labs Cont w/wt loss 

## 2017-04-07 NOTE — Assessment & Plan Note (Signed)
Dr Paulla Dolly - ref placed H/o remote surgery Labs

## 2017-04-08 ENCOUNTER — Encounter: Payer: Self-pay | Admitting: Physical Therapy

## 2017-04-10 ENCOUNTER — Other Ambulatory Visit: Payer: Self-pay

## 2017-04-10 DIAGNOSIS — K5989 Other specified functional intestinal disorders: Secondary | ICD-10-CM

## 2017-04-10 DIAGNOSIS — K598 Other specified functional intestinal disorders: Principal | ICD-10-CM

## 2017-04-15 ENCOUNTER — Ambulatory Visit: Payer: Medicare Other | Attending: Gastroenterology | Admitting: Physical Therapy

## 2017-04-15 ENCOUNTER — Telehealth: Payer: Self-pay | Admitting: Physical Therapy

## 2017-04-15 DIAGNOSIS — M6281 Muscle weakness (generalized): Secondary | ICD-10-CM | POA: Insufficient documentation

## 2017-04-15 DIAGNOSIS — M62838 Other muscle spasm: Secondary | ICD-10-CM | POA: Insufficient documentation

## 2017-04-15 DIAGNOSIS — R278 Other lack of coordination: Secondary | ICD-10-CM | POA: Insufficient documentation

## 2017-04-15 NOTE — Telephone Encounter (Signed)
Called pt and spoke to pt's family member who reported that patient had to go to the doctor and could not attend therapy   Zannie Cove, PT 04/15/17 4:00 PM

## 2017-04-16 ENCOUNTER — Telehealth: Payer: Self-pay | Admitting: Pulmonary Disease

## 2017-04-16 NOTE — Telephone Encounter (Signed)
Prednisone 10 mg take  4 each am x 2 days,   2 each am x 2 days,  1 each am x 2 days and stop   If not improving  needs ov first of week

## 2017-04-16 NOTE — Telephone Encounter (Signed)
Called and spoke with pt's mother. Arbie Cookey states pt is experiencing prod cough with clear mucus, wheezing & increased sob. Arbie Cookey states cough worsen at night.  Pt has taken delsym and tussin with no relief.  MW please advise, as VS is unavailable. Thanks.  09/25/16 AVS  Instructions      Return in about 6 weeks (around 11/06/2016).  Try using claritin 10 mg daily Continue dulera, singulair, and flonase Follow up in 6 to 8 weeks with Dr. Halford Chessman or Nurse Practitioner

## 2017-04-16 NOTE — Telephone Encounter (Signed)
ATC pt- no answer with no option to leave vm.   

## 2017-04-17 MED ORDER — PREDNISONE 10 MG PO TABS: ORAL_TABLET | ORAL | 0 refills | Status: DC

## 2017-04-17 NOTE — Telephone Encounter (Signed)
Called and spoke with pts mother and she is aware of MW recs.  She is aware of the med that has been sent to the pharmacy and they are aware to call back if the pt is not better by Monday.

## 2017-04-22 ENCOUNTER — Encounter: Payer: Self-pay | Admitting: Physical Therapy

## 2017-04-25 ENCOUNTER — Ambulatory Visit: Payer: Medicare Other | Admitting: Podiatry

## 2017-04-28 ENCOUNTER — Telehealth: Payer: Self-pay | Admitting: Pulmonary Disease

## 2017-04-28 NOTE — Telephone Encounter (Signed)
Called and spoke to pt's mother, Arbie Cookey. Arbie Cookey states pt developed a cold in 03/2017, since pt has had a lingering cough. Cough produced clear mucus. Cough worsen at night.   VS please advise. Thanks.

## 2017-04-28 NOTE — Telephone Encounter (Signed)
Please make sure she is using nasal irrigation and flonase.  She should continue her singulair and dulera.  She can sip water during the day when she has the urge to cough, use salt water gargles twice per day, use sugarless candy to keep her mouth moist, and use delsym bid prn for her cough.  She would need ROV if her symptoms don't improve.

## 2017-04-28 NOTE — Telephone Encounter (Signed)
atc pt's mother, no answer, line went to a fast beep after several rings.  Wcb.

## 2017-04-29 ENCOUNTER — Ambulatory Visit: Payer: Medicare Other | Admitting: Physical Therapy

## 2017-04-29 DIAGNOSIS — M62838 Other muscle spasm: Secondary | ICD-10-CM

## 2017-04-29 DIAGNOSIS — M6281 Muscle weakness (generalized): Secondary | ICD-10-CM

## 2017-04-29 DIAGNOSIS — R278 Other lack of coordination: Secondary | ICD-10-CM

## 2017-04-29 NOTE — Telephone Encounter (Signed)
Called and spoke with pts mother and she is aware of recs per VS.  Nothing further is needed.

## 2017-04-29 NOTE — Patient Instructions (Signed)
   Piriformis Stretch    Lying on back, pull right knee toward opposite shoulder. Hold _30___ seconds. Repeat __2__ times. Do _1___ sessions per day.  http://gt2.exer.us/258   Copyright  VHI. All rights reserved.   Piriformis Stretch, Supine    Lie supine, one ankle crossed onto opposite knee. Holding bottom leg behind knee, gently pull legs toward chest until stretch is felt in buttock of top leg. Hold _30__ seconds. For deeper stretch gently push top knee away from body.  Repeat _2__ times per session. Do _2__ sessions per day.  Copyright  VHI. All rights reserved.    Supine Knee-to-Chest, Unilateral    Lie on back, hands clasped behind one knee. Pull knee in toward chest until a comfortable stretch is felt in lower back and buttocks. Hold 30___ seconds.  Repeat __2_ times per session. Do _1__ sessions per day.  Copyright  VHI. All rights reserved.  Supine With Rotation    Lie on back with one knee drawn toward chest. Slowly bring bent leg across body until stretch is felt in lower back area. Hold _30__ seconds. Repeat to other side. Repeat _2__ times per session. Do __2_ sessions per day.  Copyright  VHI. All rights reserved.  Butterfly, Supine    Lie on back, feet together. Lower knees toward floor. Hold 60___ seconds. Repeat _2__ times per session. Do _1__ sessions per day.  Copyright  VHI. All rights reserved.     Piriformis Stretch, Sitting    Sit, one ankle on opposite knee, same-side hand on crossed knee. Push down on knee, keeping spine straight. Lean torso forward, with flat back, until tension is felt in hamstrings and gluteals of crossed-leg side. Hold _30__ seconds.  Repeat _3__ times per session. Do __1_ sessions per day.  Copyright  VHI. All rights reserved.    Chair Sitting    Sit at edge of seat, spine straight, one leg extended. Put a hand on each thigh and bend forward from the hip, keeping spine straight. Allow hand on  extended leg to reach toward toes. Support upper body with other arm. Hold _30__ seconds. Repeat __3_ times per session. Do __1_ sessions per day.  Copyright  VHI. All rights reserved.   Adduction: Hip - Knees Together (Hook-Lying)    Lie with hips and knees bent, towel roll between knees. Push knees together while bracing your abdominals and imagine a zipper that zips you up to your ribs like a corsette. Hold for __5_ seconds. Rest for _2__ seconds. Repeat __10_ times. Do __2_ times a day.   Copyright  VHI. All rights reserved.   [facility] [address] [place], [place] [address] Phone # [phone] Fax [phone]

## 2017-04-29 NOTE — Telephone Encounter (Signed)
Patient mother called back - she can be reached at [phone] -pr

## 2017-04-29 NOTE — Telephone Encounter (Signed)
atc pt's mother X2, no answer and no vm.  Wcb.

## 2017-04-29 NOTE — Therapy (Signed)
[facility] [address] [place], [place], [address] Phone: [phone]   Fax:  [phone]  Physical Therapy Treatment  Patient Details  Name: Heidi Good MRN: [id number] Date of Birth: 05/05/1961 Referring Provider: Mauri Pole    Encounter Date: 04/29/2017  PT End of Session - 04/29/17 1222    Visit Number  2    Number of Visits  10    Date for PT Re-Evaluation  05/16/17    PT Start Time  1108    PT Stop Time  1146    PT Time Calculation (min)  38 min    Activity Tolerance  Patient tolerated treatment well    Behavior During Therapy  [facility] for tasks assessed/performed       Past Medical History:  Diagnosis Date  . Anxiety   . Constipation   . Deafness   . Depression   . Gastroparesis   . Hyperlipemia   . Hypothyroidism   . Type II or unspecified type diabetes mellitus without mention of complication, not stated as uncontrolled   . Unspecified constipation 07/17/2013    Past Surgical History:  Procedure Laterality Date  . ABDOMINAL HYSTERECTOMY    . ANAL RECTAL MANOMETRY N/A 03/07/2017   Procedure: ANO RECTAL MANOMETRY;  Surgeon: Mauri Pole, MD;  Location: [facility];  Service: Endoscopy;  Laterality: N/A;    There were no vitals filed for this visit.  Subjective Assessment - 04/29/17 1110    Subjective  I have been going normally and not having any difficulty.  I have been eating more fruit and vegetables.      Patient is accompained by:  Interpreter    Pertinent History  abdominal hysterectomy 1989, deaf    How long can you walk comfortably?  walking 30 min every day    Patient Stated Goals  Have regular bowel movement    Currently in Pain?  No/denies                      [facility] Adult PT Treatment/Exercise - 04/29/17 0001      Neuro Re-ed    Neuro Re-ed Details   breathing with coordination of abdominal and pelvic floor contract/relax; breathing with pelvic  floor relax and bulge      Exercises   Exercises  Lumbar;Other Exercises    Other Exercises   educated and performed all exercises in HEP      Lumbar Exercises: Stretches   Active Hamstring Stretch  2 reps;30 seconds             PT Education - 04/29/17 1220    Education provided  Yes    Education Details  stretches and abdominal contraction    Person(s) Educated  Patient    Methods  Explanation;Demonstration;Handout;Verbal cues;Tactile cues    Comprehension  Verbalized understanding;Returned demonstration       PT Short Term Goals - 04/29/17 1226      PT SHORT TERM GOAL #1   Title  pt will be ind with toileting techniques    Status  Achieved      PT SHORT TERM GOAL #2   Title  pt will be able to have increased diameter of stool due to improved relaxation of pelvic floor    Status  Achieved        PT Long Term Goals - 04/29/17 1226      PT LONG TERM GOAL #1   Title  Pt  reports feeling like she is able to completely empty bowels and only have 1-2 BM/day    Status  Achieved      PT LONG TERM GOAL #2   Title  pt will have quarter size diameter of bowel due to improved muscle relaxation    Status  Achieved      PT LONG TERM GOAL #3   Title  pt will have left hip MMT 4+/5 abduction for improved functional activities without having to overuse posterior pelvic floor    Status  Not Met      PT LONG TERM GOAL #4   Title  pt will report 50% less pain during BM due to improved muscle coordination    Status  Achieved            Plan - 05/07/2017 1223    Clinical Impression Statement  Pt states she has had no issues with bowel movements for the last two weeks and feels like eating better is doing the trick. Pt does have some tightness in glutes, hamstring, piriformis and exhibits decreased ribcage movement with breathing.  Pt was educated on these issues and given HEP in order to prevent bowel difficutly from re-occuring  She will discharge from skilled PT today due  to being satisfied with her progress.    Rehab Potential  Good    PT Treatment/Interventions  ADLs/Self Care Home Management;Biofeedback;Cryotherapy;Electrical Stimulation;Moist Heat;Ultrasound;Traction;Functional mobility training;Therapeutic activities;Therapeutic exercise;Patient/family education;Neuromuscular re-education;Balance training;Manual techniques;Scar mobilization;Passive range of motion;Dry needling;Taping    PT Next Visit Plan  discharged today    Consulted and Agree with Plan of Care  Patient       Patient will benefit from skilled therapeutic intervention in order to improve the following deficits and impairments:  Decreased strength, Increased muscle spasms  Visit Diagnosis: Other muscle spasm  Other lack of coordination  Muscle weakness (generalized)   G-Codes - 07-May-2017 1228    Functional Assessment Tool Used (Outpatient Only)  clinical assessment    Functional Limitation  Self care    Self Care Current Status (Z3664)  At least 1 percent but less than 20 percent impaired, limited or restricted    Self Care Goal Status (Q0347)  At least 1 percent but less than 20 percent impaired, limited or restricted    Self Care Discharge Status [phone])  At least 1 percent but less than 20 percent impaired, limited or restricted       Problem List Patient Active Problem List   Diagnosis Date Noted  . Paresthesia 04/07/2017  . Foot pain, left 04/07/2017  . Plantar fasciitis 02/26/2017  . Chest pain, atypical 12/04/2016  . Snoring 09/25/2016  . Arthralgia 09/04/2016  . GERD (gastroesophageal reflux disease) 04/03/2016  . Fatigue 08/02/2015  . Acute sinusitis 08/02/2015  . Hip pain, bilateral 06/01/2015  . Prediabetes 03/08/2015  . Hyperlipidemia 03/08/2015  . Constipation 07/17/2013  . Seasonal and perennial allergic rhinitis 08/28/2011  . Asthma, mild intermittent 08/28/2011  . Obstructive sleep apnea 08/28/2011  . Gastroparesis 04/25/2010  . IBS 08/16/2008  .  Hypothyroidism 07/06/2008  . Schizoaffective disorder ([place]) 07/06/2008  . Major depressive disorder, single episode, moderate ([place]) 07/06/2008  . Hearing loss 07/06/2008    Zannie Cove 05/07/17, 12:29 PM  [facility] [address], [place], [place], [address] Phone: [phone]   Fax:  [phone]  Name: Heidi Good MRN: [id number] Date of Birth: 05-22-1960  PHYSICAL THERAPY DISCHARGE SUMMARY  Visits from Start of Care: 2  Current functional level related to goals / functional outcomes: See above   Remaining deficits: See above   Education / Equipment: HEP  Plan: Patient agrees to discharge.  Patient goals were partially met. Patient is being discharged due to being pleased with the current functional level.  ?????         Zannie Cove, PT 04/29/17 12:31 PM

## 2017-05-01 ENCOUNTER — Other Ambulatory Visit: Payer: Self-pay

## 2017-05-01 ENCOUNTER — Encounter: Payer: Self-pay | Admitting: Acute Care

## 2017-05-01 ENCOUNTER — Ambulatory Visit (INDEPENDENT_AMBULATORY_CARE_PROVIDER_SITE_OTHER): Payer: Medicare Other | Admitting: Acute Care

## 2017-05-01 VITALS — BP 130/76 | HR 77 | Temp 98.2°F | Ht 65.0 in | Wt 200.4 lb

## 2017-05-01 DIAGNOSIS — J4520 Mild intermittent asthma, uncomplicated: Secondary | ICD-10-CM | POA: Diagnosis not present

## 2017-05-01 DIAGNOSIS — R05 Cough: Secondary | ICD-10-CM

## 2017-05-01 DIAGNOSIS — R059 Cough, unspecified: Secondary | ICD-10-CM

## 2017-05-01 LAB — NITRIC OXIDE: Nitric Oxide: 14

## 2017-05-01 MED ORDER — PREDNISONE 10 MG PO TABS: ORAL_TABLET | ORAL | 0 refills | Status: DC

## 2017-05-01 MED ORDER — HYDROCODONE-HOMATROPINE 5-1.5 MG/5ML PO SYRP: 5.0000 mL | ORAL_SOLUTION | Freq: Four times a day (QID) | ORAL | 0 refills | Status: DC | PRN

## 2017-05-01 MED ORDER — LEVALBUTEROL HCL 0.63 MG/3ML IN NEBU
0.6300 mg | INHALATION_SOLUTION | RESPIRATORY_TRACT | Status: AC
  Administered 2017-05-01: 0.63 mg via RESPIRATORY_TRACT

## 2017-05-01 MED ORDER — MELOXICAM 15 MG PO TABS: 15.0000 mg | ORAL_TABLET | Freq: Every day | ORAL | 0 refills | Status: DC

## 2017-05-01 MED ORDER — ALBUTEROL SULFATE (2.5 MG/3ML) 0.083% IN NEBU: 2.5000 mg | INHALATION_SOLUTION | Freq: Four times a day (QID) | RESPIRATORY_TRACT | 5 refills | Status: DC | PRN

## 2017-05-01 NOTE — Progress Notes (Signed)
History of Present Illness Heidi Good is a 56 y.o. female with severe extrinsic asthma . She is followed by Dr. Annamaria Boots.   05/01/2017 Acute OV: Pt. Presents for an acute visit. An interpreter  is here to assist with history through sign language as patient is deaf. She states she has had a cough. Per patient 's mother , Pt developed a cold in 03/2017, since pt has had a lingering cough. Cough produced clear mucus. Cough worsen at night. Dr. Halford Chessman was messaged and recommended we make sure she is using nasal irrigation and flonase.  She should continue her singulair and dulera.  She can sip water during the day when she has the urge to cough, use salt water gargles twice per day, use sugarless candy to keep her mouth moist, and use delsym bid prn for her cough.  She would need ROV if her symptoms don't improve. She presents today for continued symptoms.She denies fever. Secretions are clear. She states she has tried robitussin at home without improvement. She denies fever, chest pain, orthopnea or hemoptysis.  if symptoms persist, then might need to consider adding biologic agent  Test Results: 05/01/2017>> FENO>> 14 ppb  Pulmonary tests FeNO 06/25/16 >> 21 RAST 07/05/16 >> multiple allergies, IgE 200 PFT 09/10/16 >> FEV1 2.15 (77%), FEV1% 84%, TLC 4.72 (90%), DLCO 86%, no BD  Sleep tests PSG 09/24/16 >> AHI 0, SpO2 low 90%, minimal sleep time  Past medical history Anxiety, Deaf, Depression, Gastroparesis, HLD, Hypothyroidism, DM   CBC Latest Ref Rng & Units 09/04/2016 06/25/2016 03/10/2015  WBC 4.0 - 10.5 K/uL 12.5(H) 14.4(H) 10.7(H)  Hemoglobin 12.0 - 15.0 g/dL 15.3(H) 14.6 15.2(H)  Hematocrit 36.0 - 46.0 % 44.7 42.3 45.3  Platelets 150.0 - 400.0 K/uL 219.0 205.0 207.0    BMP Latest Ref Rng & Units 09/04/2016 06/25/2016 03/06/2016  Glucose 70 - 99 mg/dL 99 77 97  BUN 6 - 23 mg/dL 5(L) 5(L) 8  Creatinine 0.40 - 1.20 mg/dL 0.69 0.61 0.82  Sodium 135 - 145 mEq/L 137 139 137    Potassium 3.5 - 5.1 mEq/L 3.7 3.1(L) 3.8  Chloride 96 - 112 mEq/L 100 101 98  CO2 19 - 32 mEq/L 30 28 31   Calcium 8.4 - 10.5 mg/dL 9.0 9.2 9.2     PFT    Component Value Date/Time   FEV1PRE 2.06 09/10/2016 1148   FEV1POST 2.15 09/10/2016 1148   FVCPRE 2.56 09/10/2016 1148   FVCPOST 2.55 09/10/2016 1148   TLC 4.72 09/10/2016 1148   DLCOUNC 22.04 09/10/2016 1148   PREFEV1FVCRT 80 09/10/2016 1148   PSTFEV1FVCRT 84 09/10/2016 1148    No results found.   Past medical hx Past Medical History:  Diagnosis Date  . Anxiety   . Constipation   . Deafness   . Depression   . Gastroparesis   . Hyperlipemia   . Hypothyroidism   . Type II or unspecified type diabetes mellitus without mention of complication, not stated as uncontrolled   . Unspecified constipation 07/17/2013     Social History   Tobacco Use  . Smoking status: Never Smoker  . Smokeless tobacco: Never Used  Substance Use Topics  . Alcohol use: No  . Drug use: No    Ms.Hensch reports that  has never smoked. she has never used smokeless tobacco. She reports that she does not drink alcohol or use drugs.  Tobacco Cessation: Never smoker  Past surgical hx, Family hx, Social hx all reviewed.  Current Outpatient Medications  on File Prior to Visit  Medication Sig  . albuterol (PROVENTIL HFA;VENTOLIN HFA) 108 (90 Base) MCG/ACT inhaler Inhale 1-2 puffs into the lungs every 6 (six) hours as needed for wheezing or shortness of breath.  Marland Kitchen albuterol (PROVENTIL) (2.5 MG/3ML) 0.083% nebulizer solution Take 3 mLs (2.5 mg total) by nebulization every 6 (six) hours as needed for wheezing or shortness of breath.  . ALPRAZolam (XANAX) 0.5 MG tablet Take 1 tablet (0.5 mg total) by mouth at bedtime as needed for anxiety.  . ARIPiprazole (ABILIFY) 15 MG tablet Take 15 mg by mouth daily.  Marland Kitchen aspirin EC 81 MG tablet Take 1 tablet (81 mg total) by mouth daily.  Marland Kitchen estradiol (ESTRACE) 1 MG tablet Take 1 tablet (1 mg total) by mouth  daily.  . fluticasone (FLONASE) 50 MCG/ACT nasal spray Place 2 sprays into both nostrils daily.  Marland Kitchen levothyroxine (SYNTHROID, LEVOTHROID) 75 MCG tablet Take 1 tablet (75 mcg total) by mouth daily.  . meloxicam (MOBIC) 15 MG tablet Take 1 tablet (15 mg total) by mouth daily.  . mometasone-formoterol (DULERA) 200-5 MCG/ACT AERO Inhale 2 puffs into the lungs 2 (two) times daily.  . montelukast (SINGULAIR) 10 MG tablet Take 1 tablet (10 mg total) by mouth at bedtime.  Marland Kitchen NITRO-BID 2 % ointment Place 1 application rectally. As needed  . omeprazole (PRILOSEC) 40 MG capsule Take 1 capsule (40 mg total) by mouth daily.  . ondansetron (ZOFRAN) 8 MG tablet Take 1 tablet (8 mg total) by mouth every 8 (eight) hours as needed for nausea or vomiting.  . potassium chloride (KLOR-CON) 8 MEQ tablet Take 1 tablet (8 mEq total) by mouth daily.  . predniSONE (DELTASONE) 10 MG tablet 4 tabs x 2 days, 2 tabs x 2 days, 1 tab x 2 days then stop  . traMADol (ULTRAM) 50 MG tablet Take 1-2 tablets (50-100 mg total) by mouth 2 (two) times daily as needed for severe pain.   Current Facility-Administered Medications on File Prior to Visit  Medication  . 0.9 %  sodium chloride infusion     Allergies  Allergen Reactions  . Doxycycline Nausea And Vomiting  . Hydrocodone     Nausea from cough syrup  . Other     Dog, cat, nuts, grass, trees    Review Of Systems:  Constitutional:   No  weight loss, night sweats,  Fevers, chills, fatigue, or  lassitude.  HEENT:   No headaches,  Difficulty swallowing,  Tooth/dental problems, or  Sore throat,                No sneezing, itching, ear ache, nasal congestion, post nasal drip,   CV:  No chest pain,  Orthopnea, PND, swelling in lower extremities, anasarca, dizziness, palpitations, syncope.   GI  No heartburn, indigestion, abdominal pain, nausea, vomiting, diarrhea, change in bowel habits, loss of appetite, bloody stools.   Resp: No shortness of breath with exertion or at  rest.  No excess mucus, + productive cough,  + non-productive cough,  No coughing up of blood.  No change in color of mucus.  No wheezing.  No chest wall deformity  Skin: no rash or lesions.  GU: no dysuria, change in color of urine, no urgency or frequency.  No flank pain, no hematuria   MS:  No joint pain or swelling.  No decreased range of motion.  No back pain.  Psych:  No change in mood or affect. No depression or anxiety.  No memory loss.  Vital Signs BP 130/76 (BP Location: Left Arm, Cuff Size: Normal)   Pulse 77   Temp 98.2 F (36.8 C) (Oral)   Ht 5\' 5"  (1.651 m)   Wt 200 lb 6.4 oz (90.9 kg)   SpO2 97%   BMI 33.35 kg/m    Physical Exam:  General- No distress,  A&Ox3,pleasant , communicating through interpreter. ENT: No sinus tenderness, TM clear, pale nasal mucosa, no oral exudate,+ post nasal drip, no LAN Cardiac: S1, S2, regular rate and rhythm, no murmur Chest: No wheeze/ rales/ dullness; no accessory muscle use, no nasal flaring, no sternal retractions Abd.: Soft Non-tender, non-distended Ext: No clubbing cyanosis, edema Neuro:  normal strength, MAE x 4, A&O x 3 Skin: No rashes, warm and dry, intact without lesions Psych: normal mood and behavior   Assessment/Plan  No problem-specific Assessment & Plan notes found for this encounter.    Magdalen Spatz, NP 05/01/2017  10:23 AM

## 2017-05-01 NOTE — Patient Instructions (Addendum)
It is nice to meet you today. FENO today Xopenex  Neb now. Prednisone taper; 10 mg tablets: 4 tabs x 2 days, 3 tabs x 2 days, 2 tabs x 2 days 1 tab x 2 days then stop. We will renew your albuterol nebulizer treatments Do these once in the morning and once in the evening until you feel better, then only as needed for shortness of breath or wheezing.. Continue nasal irrigation and Flonase  As you have been doing Continue Singulair and Dulera as you have been doing. Rinse mouth after use. Add Claritin 10 mg daily, generic ok Sips of water instead of throat clearing. Sugar free Jolly Ranchers for throat soothing Delsym every 12 hours  for cough Continue Prilosec as you have been doing. Follow up in 2 weeks with Judson Roch or Dr. Annamaria Boots Please contact office for sooner follow up if symptoms do not improve or worsen or seek emergency care  Hydromet 200 cc's at bedtime for cough Do not drive if sleepy.

## 2017-05-01 NOTE — Assessment & Plan Note (Addendum)
Mild flare with non-productive cough ? Post viral since 03/2017 Plan: FENO today Xopenex  Neb now. Prednisone taper; 10 mg tablets: 4 tabs x 2 days, 3 tabs x 2 days, 2 tabs x 2 days 1 tab x 2 days then stop. We will renew your albuterol nebulizer treatments Do these once in the morning and once in the evening until you feel better, then only as needed for shortness of breath or wheezing.. Continue nasal irrigation and Flonase  As you have been doing Continue Singulair and Dulera as you have been doing. Rinse mouth after use. Add Claritin 10 mg daily, generic ok Sips of water instead of throat clearing. Sugar free Jolly Ranchers for throat soothing Delsym every 12 hours  for cough Continue Prilosec as you have been doing. Follow up in 2 weeks with Judson Roch or Dr. Halford Chessman  Please contact office for sooner follow up if symptoms do not improve or worsen or seek emergency care  Hydromet 5 cc's  at bedtime for cough Do not drive if sleepy.

## 2017-05-15 ENCOUNTER — Encounter: Payer: Self-pay | Admitting: Physical Therapy

## 2017-05-16 ENCOUNTER — Other Ambulatory Visit (INDEPENDENT_AMBULATORY_CARE_PROVIDER_SITE_OTHER): Payer: Medicare Other

## 2017-05-16 ENCOUNTER — Telehealth: Payer: Self-pay | Admitting: Internal Medicine

## 2017-05-16 DIAGNOSIS — R202 Paresthesia of skin: Secondary | ICD-10-CM | POA: Diagnosis not present

## 2017-05-16 LAB — BASIC METABOLIC PANEL
BUN: 6 mg/dL (ref 6–23)
CHLORIDE: 98 meq/L (ref 96–112)
CO2: 33 mEq/L — ABNORMAL HIGH (ref 19–32)
CREATININE: 0.68 mg/dL (ref 0.40–1.20)
Calcium: 9.7 mg/dL (ref 8.4–10.5)
GFR: 95.16 mL/min (ref >60.00)
GLUCOSE: 100 mg/dL — AB (ref 70–99)
POTASSIUM: 4.2 meq/L (ref 3.5–5.1)
Sodium: 138 mEq/L (ref 135–145)

## 2017-05-16 LAB — VITAMIN B12: Vitamin B-12: 739 pg/mL (ref 211–911)

## 2017-05-16 LAB — TSH: TSH: 0.83 u[IU]/mL (ref 0.35–4.50)

## 2017-05-16 MED ORDER — ESTRADIOL 1 MG PO TABS: 1.0000 mg | ORAL_TABLET | Freq: Every day | ORAL | 1 refills | Status: DC

## 2017-05-16 NOTE — Telephone Encounter (Signed)
Needs Estradiol refill OK She will need toi see a new GYN, she is aware

## 2017-05-22 ENCOUNTER — Ambulatory Visit (INDEPENDENT_AMBULATORY_CARE_PROVIDER_SITE_OTHER): Payer: Medicare Other

## 2017-05-22 ENCOUNTER — Other Ambulatory Visit: Payer: Self-pay | Admitting: Podiatry

## 2017-05-22 ENCOUNTER — Encounter: Payer: Self-pay | Admitting: Podiatry

## 2017-05-22 ENCOUNTER — Ambulatory Visit (INDEPENDENT_AMBULATORY_CARE_PROVIDER_SITE_OTHER): Payer: Medicare Other | Admitting: Podiatry

## 2017-05-22 VITALS — BP 136/100 | HR 80 | Resp 16

## 2017-05-22 DIAGNOSIS — M779 Enthesopathy, unspecified: Secondary | ICD-10-CM | POA: Diagnosis not present

## 2017-05-22 DIAGNOSIS — M79672 Pain in left foot: Secondary | ICD-10-CM

## 2017-05-22 DIAGNOSIS — M7750 Other enthesopathy of unspecified foot: Secondary | ICD-10-CM | POA: Diagnosis not present

## 2017-05-22 MED ORDER — DICLOFENAC SODIUM 75 MG PO TBEC: 75.0000 mg | DELAYED_RELEASE_TABLET | Freq: Two times a day (BID) | ORAL | 2 refills | Status: DC

## 2017-05-22 MED ORDER — TRIAMCINOLONE ACETONIDE 10 MG/ML IJ SUSP
10.0000 mg | Freq: Once | INTRAMUSCULAR | Status: AC
  Administered 2017-05-22: 10 mg

## 2017-05-22 NOTE — Progress Notes (Signed)
   Subjective:    Patient ID: Heidi Good, female    DOB: 12-25-1960, 57 y.o.   MRN: [id number]  HPI    Review of Systems  All other systems reviewed and are negative.      Objective:   Physical Exam        Assessment & Plan:

## 2017-05-22 NOTE — Progress Notes (Signed)
Subjective:   Patient ID: Heidi Good, female   DOB: 57 y.o.   MRN: [id number]   HPI Patient presents with pain in the left medial ankle stating that it's been present for around 3 months. Presents with interpreter and states that it's hard for her to walk   Review of Systems  All other systems reviewed and are negative.       Objective:  Physical Exam  Constitutional: She appears well-developed and well-nourished.  Cardiovascular: Intact distal pulses.  Pulmonary/Chest: Effort normal.  Musculoskeletal: Normal range of motion.  Neurological: She is alert.  Skin: Skin is warm.  Nursing note and vitals reviewed.   Neurovascular status intact muscle strength adequate range of motion within normal limits with patient found to have exquisite discomfort in the posterior tibial tendon left near its insertion into the navicular with no current indications that there is a tear of the tendon. Patient does have mild depression of the arch and history of bunion correction right which is doing well. Patient has good digital perfusion and is well oriented 3     Assessment:  Acute posterior tibial tendinitis left with moderate arch depression     Plan:  H&P condition and everything explained to interpreter and patient and today I did careful sheath injection after explaining risk of the posterior tibial tendon underneath the medial malleolus and I then applied an air fracture walker to completely immobilize the foot and ankle. Patient will be seen back in 3 weeks earlier if any issues should occur  X-rays indicate that there is no signs of stress fracture or arthritic pathology with moderate arch depression

## 2017-05-26 ENCOUNTER — Ambulatory Visit: Payer: Self-pay | Admitting: Acute Care

## 2017-06-04 ENCOUNTER — Ambulatory Visit (INDEPENDENT_AMBULATORY_CARE_PROVIDER_SITE_OTHER): Payer: Medicare Other | Admitting: Acute Care

## 2017-06-04 ENCOUNTER — Encounter: Payer: Self-pay | Admitting: Acute Care

## 2017-06-04 DIAGNOSIS — J4520 Mild intermittent asthma, uncomplicated: Secondary | ICD-10-CM | POA: Diagnosis not present

## 2017-06-04 NOTE — Assessment & Plan Note (Signed)
Resolution of mild flare Cough has resolved Patient was compliant with prednisone taper and other recommendations Plan Continue nasal irrigation and Flonase  As you have been doing Add nasal saline gel ( Ayr) for nasal dryness. Continue Singulair and Dulera as you have been doing. Rinse mouth after use. Add Claritin 10 mg daily, generic ok Continue Prilosec as you have been doing. Follow up with Dr. Halford Chessman in 6 months . Please contact office for sooner follow up if symptoms do not improve or worsen or seek emergency care

## 2017-06-04 NOTE — Patient Instructions (Addendum)
It is good to see you today. We are so glad you are better.  Continue nasal irrigation and Flonase  As you have been doing Add nasal saline gel ( Ayr) for nasal dryness. Continue Singulair and Dulera as you have been doing. Rinse mouth after use. Add Claritin 10 mg daily, generic ok Continue Prilosec as you have been doing. Follow up with Dr. Halford Chessman in 6 months . Please contact office for sooner follow up if symptoms do not improve or worsen or seek emergency care

## 2017-06-04 NOTE — Progress Notes (Signed)
History of Present Illness Heidi Good is a 57 y.o. female severe extrinsic asthma . She is followed by Dr. Annamaria Boots.   06/04/2017 Pt. Presents for follow up of cough. She was seen 05/01/2018. Plan of care at that time was as below:  Plan of Care FENO today [place] now. Prednisone taper; 10 mg tablets: 4 tabs x 2 days, 3 tabs x 2 days, 2 tabs x 2 days 1 tab x 2 days then stop. We will renew your albuterol nebulizer treatments Do these once in the morning and once in the evening until you feel better, then only as needed for shortness of breath or wheezing.. Continue nasal irrigation and Flonase  As you have been doing Continue Singulair and Dulera as you have been doing. Rinse mouth after use. Add Claritin 10 mg daily, generic ok Sips of water instead of throat clearing. Sugar free Jolly Ranchers for throat soothing Delsym every 12 hours  for cough Continue Prilosec as you have been doing. Follow up in 2 weeks with Heidi Good or Dr. Annamaria Boots Please contact office for sooner follow up if symptoms do not improve or worsen or seek emergency care  Hydromet 200 cc's at bedtime for cough Do not drive if sleepy.  She returns today for follow up: Patient presents today with her interpreter.  Patient is deaf.  Pt. States she has been compliant with her prednisone taper and completed it.. She  Used  the cough medicine only 3 days, and the cough completely resolved.She has been compliant with her nasal irrigation and Flonase, Singulair and Dulera.  Compliant with her Claritin, sips of water instead of throat clearing, and use of sugar-free hard candies for throat soothing.  Additionally she was using Delsym every 12 hours for cough.  She is also been compliant with her Prilosec.  She denies fever, chest pain, orthopnea, or hemoptysis.   Test Results:  05/01/2017>> FENO>> 14 ppb  Pulmonary tests FeNO 06/25/16 >> 21 RAST 07/05/16 >> multiple allergies, IgE 200 PFT 09/10/16 >> FEV1 2.15 (77%),  FEV1% 84%, TLC 4.72 (90%), DLCO 86%, no BD  Sleep tests PSG 09/24/16 >> AHI 0, SpO2 low 90%, minimal sleep time  Past medical history Anxiety, Deaf, Depression, Gastroparesis, HLD, Hypothyroidism, DM  CBC Latest Ref Rng & Units 09/04/2016 06/25/2016 03/10/2015  WBC 4.0 - 10.5 K/uL 12.5(H) 14.4(H) 10.7(H)  Hemoglobin 12.0 - 15.0 g/dL 15.3(H) 14.6 15.2(H)  Hematocrit 36.0 - 46.0 % 44.7 42.3 45.3  Platelets 150.0 - 400.0 K/uL 219.0 205.0 207.0    BMP Latest Ref Rng & Units 05/16/2017 09/04/2016 06/25/2016  Glucose 70 - 99 mg/dL 100(H) 99 77  BUN 6 - 23 mg/dL 6 5(L) 5(L)  Creatinine 0.40 - 1.20 mg/dL 0.68 0.69 0.61  Sodium 135 - 145 mEq/L 138 137 139  Potassium 3.5 - 5.1 mEq/L 4.2 3.7 3.1(L)  Chloride 96 - 112 mEq/L 98 100 101  CO2 19 - 32 mEq/L 33(H) 30 28  Calcium 8.4 - 10.5 mg/dL 9.7 9.0 9.2    PFT    Component Value Date/Time   FEV1PRE 2.06 09/10/2016 1148   FEV1POST 2.15 09/10/2016 1148   FVCPRE 2.56 09/10/2016 1148   FVCPOST 2.55 09/10/2016 1148   TLC 4.72 09/10/2016 1148   DLCOUNC 22.04 09/10/2016 1148   PREFEV1FVCRT 80 09/10/2016 1148   PSTFEV1FVCRT 84 09/10/2016 1148    Dg Foot 2 Views Left  Result Date: 05/22/2017 Please see detailed radiograph report in office note.    Past medical  hx Past Medical History:  Diagnosis Date  . Anxiety   . Constipation   . Deafness   . Depression   . Gastroparesis   . Hyperlipemia   . Hypothyroidism   . Type II or unspecified type diabetes mellitus without mention of complication, not stated as uncontrolled   . Unspecified constipation 07/17/2013     Social History   Tobacco Use  . Smoking status: Never Smoker  . Smokeless tobacco: Never Used  Substance Use Topics  . Alcohol use: No  . Drug use: No    Heidi Good reports that  has never smoked. she has never used smokeless tobacco. She reports that she does not drink alcohol or use drugs.  Tobacco Cessation: Never smoker  Past surgical hx, Family hx, Social hx all  reviewed.  Current Outpatient Medications on File Prior to Visit  Medication Sig  . albuterol (PROVENTIL HFA;VENTOLIN HFA) 108 (90 Base) MCG/ACT inhaler Inhale 1-2 puffs into the lungs every 6 (six) hours as needed for wheezing or shortness of breath.  Marland Kitchen albuterol (PROVENTIL) (2.5 MG/3ML) 0.083% nebulizer solution Take 3 mLs (2.5 mg total) by nebulization every 6 (six) hours as needed for wheezing or shortness of breath.  . ALPRAZolam (XANAX) 0.5 MG tablet Take 1 tablet (0.5 mg total) by mouth at bedtime as needed for anxiety.  . ARIPiprazole (ABILIFY) 15 MG tablet Take 15 mg by mouth daily.  Marland Kitchen aspirin EC 81 MG tablet Take 1 tablet (81 mg total) by mouth daily.  Marland Kitchen estradiol (ESTRACE) 1 MG tablet Take 1 tablet (1 mg total) by mouth daily.  . fluticasone (FLONASE) 50 MCG/ACT nasal spray Place 2 sprays into both nostrils daily.  Marland Kitchen HYDROcodone-homatropine (HYCODAN) 5-1.5 MG/5ML syrup Take 5 mLs by mouth every 6 (six) hours as needed for cough.  . levothyroxine (SYNTHROID, LEVOTHROID) 75 MCG tablet Take 1 tablet (75 mcg total) by mouth daily.  . meloxicam (MOBIC) 15 MG tablet Take 1 tablet (15 mg total) by mouth daily.  . mometasone-formoterol (DULERA) 200-5 MCG/ACT AERO Inhale 2 puffs into the lungs 2 (two) times daily.  . montelukast (SINGULAIR) 10 MG tablet Take 1 tablet (10 mg total) by mouth at bedtime.  Marland Kitchen NITRO-BID 2 % ointment Place 1 application rectally. As needed  . omeprazole (PRILOSEC) 40 MG capsule Take 1 capsule (40 mg total) by mouth daily.  . ondansetron (ZOFRAN) 8 MG tablet Take 1 tablet (8 mg total) by mouth every 8 (eight) hours as needed for nausea or vomiting.  . potassium chloride (KLOR-CON) 8 MEQ tablet Take 1 tablet (8 mEq total) by mouth daily.  . predniSONE (DELTASONE) 10 MG tablet 4 tabs x 2 days, 2 tabs x 2 days, 1 tab x 2 days then stop  . predniSONE (DELTASONE) 10 MG tablet Take 4 tabs for 2 days, then 3 tabs for 2 days, 2 tabs for 2 days, then 1 tab for 2 days, then  stop.  . traMADol (ULTRAM) 50 MG tablet Take 1-2 tablets (50-100 mg total) by mouth 2 (two) times daily as needed for severe pain.   Current Facility-Administered Medications on File Prior to Visit  Medication  . 0.9 %  sodium chloride infusion     Allergies  Allergen Reactions  . Doxycycline Nausea And Vomiting  . Hydrocodone     Nausea from cough syrup  . Other     Dog, cat, nuts, grass, trees    Review Of Systems:  Constitutional:   No  weight loss, night sweats,  Fevers, chills, fatigue, or  lassitude.  HEENT:   No headaches,  Difficulty swallowing,  Tooth/dental problems, or  Sore throat,                No sneezing, itching, ear ache, nasal congestion, post nasal drip,   CV:  No chest pain,  Orthopnea, PND, swelling in lower extremities, anasarca, dizziness, palpitations, syncope.   GI  No heartburn, indigestion, abdominal pain, nausea, vomiting, diarrhea, change in bowel habits, loss of appetite, bloody stools.   Resp: No shortness of breath with exertion or at rest.  No excess mucus, no productive cough,  No non-productive cough,  No coughing up of blood.  No change in color of mucus.  No wheezing.  No chest wall deformity  Skin: no rash or lesions.  GU: no dysuria, change in color of urine, no urgency or frequency.  No flank pain, no hematuria   MS:  No joint pain or swelling.  No decreased range of motion.  No back pain.  Psych:  No change in mood or affect. No depression or anxiety.  No memory loss.   Vital Signs BP 130/88 (BP Location: Left Arm, Cuff Size: Normal)   Pulse 72   Ht 5\' 5"  (1.651 m)   Wt 201 lb 6.4 oz (91.4 kg)   SpO2 99%   BMI 33.51 kg/m    Physical Exam:  General- No distress,  A&Ox3 pleasant, communicating through an interpreter using sign language ENT: No sinus tenderness, TM clear, pale nasal mucosa, no oral exudate,no post nasal drip, no LAN, patient is deaf Cardiac: S1, S2, regular rate and rhythm, no murmur Chest: No wheeze/ rales/  dullness; no accessory muscle use, no nasal flaring, no sternal retractions, no cough Abd.: Soft Non-tender, nondistended, bowel sounds positive Ext: No clubbing cyanosis, edema Neuro:  normal strength Skin: No rashes, warm and dry Psych: normal mood and behavior   Assessment/Plan  Asthma, mild intermittent Resolution of mild flare Cough has resolved Patient was compliant with prednisone taper and other recommendations Plan Continue nasal irrigation and Flonase  As you have been doing Add nasal saline gel ( Ayr) for nasal dryness. Continue Singulair and Dulera as you have been doing. Rinse mouth after use. Add Claritin 10 mg daily, generic ok Continue Prilosec as you have been doing. Follow up with Dr. Halford Chessman in 6 months . Please contact office for sooner follow up if symptoms do not improve or worsen or seek emergency care       Magdalen Spatz, NP 06/04/2017  11:17 PM

## 2017-06-11 ENCOUNTER — Encounter: Payer: Self-pay | Admitting: Podiatry

## 2017-06-11 ENCOUNTER — Ambulatory Visit (INDEPENDENT_AMBULATORY_CARE_PROVIDER_SITE_OTHER)
Admission: RE | Admit: 2017-06-11 | Discharge: 2017-06-11 | Disposition: A | Discharge status: Home / Self Care | Payer: Medicare Other | Source: Ambulatory Visit | Attending: Internal Medicine | Admitting: Internal Medicine

## 2017-06-11 ENCOUNTER — Other Ambulatory Visit (INDEPENDENT_AMBULATORY_CARE_PROVIDER_SITE_OTHER): Payer: Medicare Other

## 2017-06-11 ENCOUNTER — Ambulatory Visit (INDEPENDENT_AMBULATORY_CARE_PROVIDER_SITE_OTHER): Payer: Medicare Other | Admitting: Internal Medicine

## 2017-06-11 ENCOUNTER — Ambulatory Visit (INDEPENDENT_AMBULATORY_CARE_PROVIDER_SITE_OTHER): Payer: Medicare Other

## 2017-06-11 ENCOUNTER — Encounter: Payer: Self-pay | Admitting: Internal Medicine

## 2017-06-11 ENCOUNTER — Ambulatory Visit (INDEPENDENT_AMBULATORY_CARE_PROVIDER_SITE_OTHER): Payer: Medicare Other | Admitting: Podiatry

## 2017-06-11 ENCOUNTER — Other Ambulatory Visit: Payer: Self-pay | Admitting: Podiatry

## 2017-06-11 DIAGNOSIS — R0789 Other chest pain: Secondary | ICD-10-CM

## 2017-06-11 DIAGNOSIS — M779 Enthesopathy, unspecified: Secondary | ICD-10-CM | POA: Diagnosis not present

## 2017-06-11 DIAGNOSIS — M2550 Pain in unspecified joint: Secondary | ICD-10-CM | POA: Diagnosis not present

## 2017-06-11 DIAGNOSIS — R079 Chest pain, unspecified: Secondary | ICD-10-CM | POA: Diagnosis not present

## 2017-06-11 DIAGNOSIS — M79672 Pain in left foot: Secondary | ICD-10-CM

## 2017-06-11 LAB — CBC WITH DIFFERENTIAL/PLATELET
BASOS PCT: 0.2 % (ref 0.0–3.0)
Basophils Absolute: 0 10*3/uL (ref 0.0–0.1)
EOS PCT: 2.1 % (ref 0.0–5.0)
Eosinophils Absolute: 0.3 10*3/uL (ref 0.0–0.7)
HCT: 47 % — ABNORMAL HIGH (ref 36.0–46.0)
Hemoglobin: 15.8 g/dL — ABNORMAL HIGH (ref 12.0–15.0)
Lymphocytes Relative: 27.3 % (ref 12.0–46.0)
Lymphs Abs: 4.3 10*3/uL — ABNORMAL HIGH (ref 0.7–4.0)
MCHC: 33.6 g/dL (ref 30.0–36.0)
MCV: 90.3 fl (ref 78.0–100.0)
MONO ABS: 1.3 10*3/uL — AB (ref 0.1–1.0)
MONOS PCT: 8.2 % (ref 3.0–12.0)
NEUTROS ABS: 9.7 10*3/uL — AB (ref 1.4–7.7)
Neutrophils Relative %: 62.2 % (ref 43.0–77.0)
PLATELETS: 223 10*3/uL (ref 150.0–400.0)
RBC: 5.21 Mil/uL — ABNORMAL HIGH (ref 3.87–5.11)
RDW: 13.3 % (ref 11.5–15.5)
WBC: 15.6 10*3/uL — ABNORMAL HIGH (ref 4.0–10.5)

## 2017-06-11 LAB — URINALYSIS
BILIRUBIN URINE: NEGATIVE
HGB URINE DIPSTICK: NEGATIVE
KETONES UR: NEGATIVE
Leukocytes, UA: NEGATIVE
NITRITE: NEGATIVE
PH: 7.5 (ref 5.0–8.0)
Specific Gravity, Urine: 1.015 (ref 1.000–1.030)
TOTAL PROTEIN, URINE-UPE24: NEGATIVE
URINE GLUCOSE: NEGATIVE
Urobilinogen, UA: 0.2 (ref 0.0–1.0)

## 2017-06-11 MED ORDER — VALACYCLOVIR HCL 1 G PO TABS: 1000.0000 mg | ORAL_TABLET | Freq: Three times a day (TID) | ORAL | 0 refills | Status: DC

## 2017-06-11 NOTE — Patient Instructions (Addendum)
Take Valtrex if rash We will call in the antibiotic if urinary infection Go to ER if worse

## 2017-06-11 NOTE — Assessment & Plan Note (Signed)
R posterio-lateral ?etiology Valterx if rash UA, labs. Abx if UTI CXR

## 2017-06-11 NOTE — Progress Notes (Signed)
Subjective:   Patient ID: Heidi Good, female   DOB: 57 y.o.   MRN: [id number]   HPI Patient presents stating feeling much better with her interpreter today   ROS      Objective:  Physical Exam  Neurovascular status intact with patient's medial heel and ankle doing much better with discomfort still noted but significant improvement from previous     Assessment:  Doing well post tendinitis left with immobilization     Plan:  Advised on the importance of supportive shoes ice therapy and stretching exercises and reappoint as symptoms indicate

## 2017-06-11 NOTE — Assessment & Plan Note (Signed)
Viral vs other Valterx if rash UA, labs. Abx if UTI CXR

## 2017-06-11 NOTE — Progress Notes (Signed)
Subjective:  Patient ID: ISYS TIETJE, female    DOB: 05-31-1960  Age: 57 y.o. MRN: [id number]  CC: No chief complaint on file.   HPI Earnestine S Klingberg presents for arthralgia, myalgia x 2 d. No ST, fever. C/o R flank and CP  Outpatient Medications Prior to Visit  Medication Sig Dispense Refill  . albuterol (PROVENTIL HFA;VENTOLIN HFA) 108 (90 Base) MCG/ACT inhaler Inhale 1-2 puffs into the lungs every 6 (six) hours as needed for wheezing or shortness of breath. 1 Inhaler 5  . albuterol (PROVENTIL) (2.5 MG/3ML) 0.083% nebulizer solution Take 3 mLs (2.5 mg total) by nebulization every 6 (six) hours as needed for wheezing or shortness of breath. 75 mL 5  . ALPRAZolam (XANAX) 0.5 MG tablet Take 1 tablet (0.5 mg total) by mouth at bedtime as needed for anxiety. 30 tablet 0  . ARIPiprazole (ABILIFY) 15 MG tablet Take 15 mg by mouth daily.  0  . aspirin EC 81 MG tablet Take 1 tablet (81 mg total) by mouth daily. 100 tablet 3  . estradiol (ESTRACE) 1 MG tablet Take 1 tablet (1 mg total) by mouth daily. 90 tablet 1  . fluticasone (FLONASE) 50 MCG/ACT nasal spray Place 2 sprays into both nostrils daily. 16 g 2  . HYDROcodone-homatropine (HYCODAN) 5-1.5 MG/5ML syrup Take 5 mLs by mouth every 6 (six) hours as needed for cough. 200 mL 0  . levothyroxine (SYNTHROID, LEVOTHROID) 75 MCG tablet Take 1 tablet (75 mcg total) by mouth daily. 90 tablet 3  . meloxicam (MOBIC) 15 MG tablet Take 1 tablet (15 mg total) by mouth daily. 90 tablet 0  . mometasone-formoterol (DULERA) 200-5 MCG/ACT AERO Inhale 2 puffs into the lungs 2 (two) times daily. 1 Inhaler 5  . montelukast (SINGULAIR) 10 MG tablet Take 1 tablet (10 mg total) by mouth at bedtime. 30 tablet 5  . NITRO-BID 2 % ointment Place 1 application rectally. As needed    . omeprazole (PRILOSEC) 40 MG capsule Take 1 capsule (40 mg total) by mouth daily. 90 capsule 3  . ondansetron (ZOFRAN) 8 MG tablet Take 1 tablet (8 mg total) by mouth every 8 (eight) hours  as needed for nausea or vomiting. 30 tablet 1  . potassium chloride (KLOR-CON) 8 MEQ tablet Take 1 tablet (8 mEq total) by mouth daily. 90 tablet 3  . traMADol (ULTRAM) 50 MG tablet Take 1-2 tablets (50-100 mg total) by mouth 2 (two) times daily as needed for severe pain. 100 tablet 1  . predniSONE (DELTASONE) 10 MG tablet 4 tabs x 2 days, 2 tabs x 2 days, 1 tab x 2 days then stop 14 tablet 0  . predniSONE (DELTASONE) 10 MG tablet Take 4 tabs for 2 days, then 3 tabs for 2 days, 2 tabs for 2 days, then 1 tab for 2 days, then stop. 20 tablet 0   Facility-Administered Medications Prior to Visit  Medication Dose Route Frequency Provider Last Rate Last Dose  . 0.9 %  sodium chloride infusion  500 mL Intravenous Continuous Danis, Estill Cotta III, MD        ROS Review of Systems  Constitutional: Negative for activity change, appetite change, chills, fatigue, fever and unexpected weight change.  HENT: Negative for congestion, mouth sores and sinus pressure.   Eyes: Negative for visual disturbance.  Respiratory: Negative for cough and chest tightness.   Cardiovascular: Positive for chest pain.  Gastrointestinal: Negative for abdominal pain and nausea.  Genitourinary: Negative for difficulty urinating, frequency and  vaginal pain.  Musculoskeletal: Positive for arthralgias, back pain and myalgias. Negative for gait problem.  Skin: Negative for pallor and rash.  Neurological: Negative for dizziness, tremors, weakness, numbness and headaches.  Psychiatric/Behavioral: Negative for confusion and sleep disturbance.    Objective:  BP 132/80 (BP Location: Left Arm, Patient Position: Sitting, Cuff Size: Large)   Pulse (!) 57   Temp 97.8 F (36.6 C) (Oral)   Ht 5\' 5"  (1.651 m)   Wt 200 lb (90.7 kg)   SpO2 100%   BMI 33.28 kg/m   BP Readings from Last 3 Encounters:  06/11/17 132/80  06/04/17 130/88  05/22/17 (!) 136/100    Wt Readings from Last 3 Encounters:  06/11/17 200 lb (90.7 kg)  06/04/17  201 lb 6.4 oz (91.4 kg)  05/01/17 200 lb 6.4 oz (90.9 kg)    Physical Exam  Constitutional: She appears well-developed. No distress.  HENT:  Head: Normocephalic.  Right Ear: External ear normal.  Left Ear: External ear normal.  Nose: Nose normal.  Mouth/Throat: Oropharynx is clear and moist.  Eyes: Conjunctivae are normal. Pupils are equal, round, and reactive to light. Right eye exhibits no discharge. Left eye exhibits no discharge.  Neck: Normal range of motion. Neck supple. No JVD present. No tracheal deviation present. No thyromegaly present.  Cardiovascular: Normal rate, regular rhythm and normal heart sounds.  Pulmonary/Chest: No stridor. No respiratory distress. She has no wheezes. She exhibits tenderness.  Abdominal: Soft. Bowel sounds are normal. She exhibits no distension and no mass. There is no tenderness. There is no rebound and no guarding.  Musculoskeletal: She exhibits tenderness. She exhibits no edema.  Lymphadenopathy:    She has no cervical adenopathy.  Neurological: She displays normal reflexes. No cranial nerve deficit. She exhibits normal muscle tone. Coordination normal.  Skin: No rash noted. No erythema.  Psychiatric: She has a normal mood and affect. Her behavior is normal. Judgment and thought content normal.  ribs are tender on the R No rash  Lab Results  Component Value Date   WBC 12.5 (H) 09/04/2016   HGB 15.3 (H) 09/04/2016   HCT 44.7 09/04/2016   PLT 219.0 09/04/2016   GLUCOSE 100 (H) 05/16/2017   CHOL 137 03/10/2015   TRIG 194.0 (H) 03/10/2015   HDL 43.10 03/10/2015   LDLCALC 55 03/10/2015   ALT 31 09/04/2016   AST 47 (H) 09/04/2016   NA 138 05/16/2017   K 4.2 05/16/2017   CL 98 05/16/2017   CREATININE 0.68 05/16/2017   BUN 6 05/16/2017   CO2 33 (H) 05/16/2017   TSH 0.83 05/16/2017   HGBA1C 6.4 09/04/2016    Mm Screening Breast Tomo Bilateral  Result Date: 03/04/2017 CLINICAL DATA:  Screening. EXAM: 2D DIGITAL SCREENING BILATERAL  MAMMOGRAM WITH CAD AND ADJUNCT TOMO COMPARISON:  Previous exam(s). ACR Breast Density Category c: The breast tissue is heterogeneously dense, which may obscure small masses. FINDINGS: There are no findings suspicious for malignancy. Images were processed with CAD. IMPRESSION: No mammographic evidence of malignancy. A result letter of this screening mammogram will be mailed directly to the patient. RECOMMENDATION: Screening mammogram in one year. (Code:SM-B-01Y) BI-RADS CATEGORY  1: Negative. Electronically Signed   By: Dorise Bullion III M.D   On: 03/04/2017 15:28    Assessment & Plan:   There are no diagnoses linked to this encounter. I have discontinued Chrystle Murillo. Imler's predniSONE and predniSONE. I am also having her maintain her ALPRAZolam, NITRO-BID, ARIPiprazole, traMADol, albuterol, fluticasone, montelukast, omeprazole, levothyroxine,  mometasone-formoterol, ondansetron, aspirin EC, potassium chloride, HYDROcodone-homatropine, albuterol, meloxicam, and estradiol. We will continue to administer sodium chloride.  No orders of the defined types were placed in this encounter.    Follow-up: No Follow-up on file.  Walker Kehr, MD

## 2017-06-12 ENCOUNTER — Telehealth: Payer: Self-pay | Admitting: Internal Medicine

## 2017-06-12 NOTE — Telephone Encounter (Signed)
I spoke with patient this morning and lab results were given.

## 2017-06-12 NOTE — Telephone Encounter (Signed)
Copied from [place]. Topic: Quick Communication - See Telephone Encounter >> Jun 12, 2017  9:27 AM Clack, Laban Emperor wrote: CRM for notification. See Telephone encounter for: Arbie Cookey pt mother calling for lab results. Please f/u.  06/12/17.

## 2017-06-30 DIAGNOSIS — F200 Paranoid schizophrenia: Secondary | ICD-10-CM | POA: Diagnosis not present

## 2017-07-28 DIAGNOSIS — F200 Paranoid schizophrenia: Secondary | ICD-10-CM | POA: Diagnosis not present

## 2017-08-05 ENCOUNTER — Ambulatory Visit (INDEPENDENT_AMBULATORY_CARE_PROVIDER_SITE_OTHER): Payer: Medicare Other | Admitting: Internal Medicine

## 2017-08-05 ENCOUNTER — Ambulatory Visit: Payer: Self-pay

## 2017-08-05 ENCOUNTER — Encounter: Payer: Self-pay | Admitting: Internal Medicine

## 2017-08-05 DIAGNOSIS — G4700 Insomnia, unspecified: Secondary | ICD-10-CM | POA: Diagnosis not present

## 2017-08-05 DIAGNOSIS — G2581 Restless legs syndrome: Secondary | ICD-10-CM | POA: Diagnosis not present

## 2017-08-05 DIAGNOSIS — R202 Paresthesia of skin: Secondary | ICD-10-CM

## 2017-08-05 MED ORDER — LEVOTHYROXINE SODIUM 75 MCG PO TABS: 75.0000 ug | ORAL_TABLET | Freq: Every day | ORAL | 3 refills | Status: DC

## 2017-08-05 MED ORDER — VITAMIN D3 50 MCG (2000 UT) PO CAPS: 2000.0000 [IU] | ORAL_CAPSULE | Freq: Every day | ORAL | 3 refills | Status: DC

## 2017-08-05 MED ORDER — POTASSIUM CHLORIDE ER 8 MEQ PO TBCR: 8.0000 meq | EXTENDED_RELEASE_TABLET | Freq: Every day | ORAL | 3 refills | Status: DC

## 2017-08-05 MED ORDER — GABAPENTIN 100 MG PO CAPS: 100.0000 mg | ORAL_CAPSULE | Freq: Every day | ORAL | 5 refills | Status: DC

## 2017-08-05 MED ORDER — ERGOCALCIFEROL 1.25 MG (50000 UT) PO CAPS: 50000.0000 [IU] | ORAL_CAPSULE | ORAL | 0 refills | Status: DC

## 2017-08-05 MED ORDER — ESTRADIOL 1 MG PO TABS: 1.0000 mg | ORAL_TABLET | Freq: Every day | ORAL | 1 refills | Status: DC

## 2017-08-05 NOTE — Progress Notes (Signed)
Subjective:  Patient ID: Heidi Good, female    DOB: May 30, 1960  Age: 57 y.o. MRN: [id number]  CC: No chief complaint on file.   HPI Heidi Good presents for leg pain, restless legs at night - worse starting 9 pm..Marland KitchenWorse since Feb 2019. No pain during the day F/u anxiety, asthma  Outpatient Medications Prior to Visit  Medication Sig Dispense Refill  . albuterol (PROVENTIL HFA;VENTOLIN HFA) 108 (90 Base) MCG/ACT inhaler Inhale 1-2 puffs into the lungs every 6 (six) hours as needed for wheezing or shortness of breath. 1 Inhaler 5  . albuterol (PROVENTIL) (2.5 MG/3ML) 0.083% nebulizer solution Take 3 mLs (2.5 mg total) by nebulization every 6 (six) hours as needed for wheezing or shortness of breath. 75 mL 5  . ALPRAZolam (XANAX) 0.5 MG tablet Take 1 tablet (0.5 mg total) by mouth at bedtime as needed for anxiety. 30 tablet 0  . aspirin EC 81 MG tablet Take 1 tablet (81 mg total) by mouth daily. 100 tablet 3  . estradiol (ESTRACE) 1 MG tablet Take 1 tablet (1 mg total) by mouth daily. 90 tablet 1  . fluticasone (FLONASE) 50 MCG/ACT nasal spray Place 2 sprays into both nostrils daily. 16 g 2  . levothyroxine (SYNTHROID, LEVOTHROID) 75 MCG tablet Take 1 tablet (75 mcg total) by mouth daily. 90 tablet 3  . meloxicam (MOBIC) 15 MG tablet Take 1 tablet (15 mg total) by mouth daily. 90 tablet 0  . mometasone-formoterol (DULERA) 200-5 MCG/ACT AERO Inhale 2 puffs into the lungs 2 (two) times daily. 1 Inhaler 5  . montelukast (SINGULAIR) 10 MG tablet Take 1 tablet (10 mg total) by mouth at bedtime. 30 tablet 5  . NITRO-BID 2 % ointment Place 1 application rectally. As needed    . omeprazole (PRILOSEC) 40 MG capsule Take 1 capsule (40 mg total) by mouth daily. 90 capsule 3  . ondansetron (ZOFRAN) 8 MG tablet Take 1 tablet (8 mg total) by mouth every 8 (eight) hours as needed for nausea or vomiting. 30 tablet 1  . perphenazine (TRILAFON) 4 MG tablet TK 1 T PO QHS FOR 4 DAYS THEN TK 2 TS HS  THEREAFTER  1  . potassium chloride (KLOR-CON) 8 MEQ tablet Take 1 tablet (8 mEq total) by mouth daily. 90 tablet 3  . traMADol (ULTRAM) 50 MG tablet Take 1-2 tablets (50-100 mg total) by mouth 2 (two) times daily as needed for severe pain. 100 tablet 1  . valACYclovir (VALTREX) 1000 MG tablet Take 1 tablet (1,000 mg total) by mouth 3 (three) times daily. 21 tablet 0  . ARIPiprazole (ABILIFY) 15 MG tablet Take 15 mg by mouth daily.  0   Facility-Administered Medications Prior to Visit  Medication Dose Route Frequency Provider Last Rate Last Dose  . 0.9 %  sodium chloride infusion  500 mL Intravenous Continuous Danis, Estill Cotta III, MD        ROS Review of Systems  Constitutional: Negative for activity change, appetite change, chills, fatigue and unexpected weight change.  HENT: Negative for congestion, mouth sores and sinus pressure.   Eyes: Negative for visual disturbance.  Respiratory: Negative for cough and chest tightness.   Gastrointestinal: Negative for abdominal pain and nausea.  Genitourinary: Negative for difficulty urinating, frequency and vaginal pain.  Musculoskeletal: Positive for arthralgias, back pain and gait problem.  Skin: Negative for pallor and rash.  Neurological: Negative for dizziness, tremors, weakness, numbness and headaches.  Psychiatric/Behavioral: Positive for sleep disturbance. Negative for confusion  and suicidal ideas.    Objective:  BP 122/76 (BP Location: Left Arm, Patient Position: Sitting, Cuff Size: Large)   Pulse 76   Temp 98.5 F (36.9 C) (Oral)   Ht 5\' 5"  (1.651 m)   Wt 201 lb (91.2 kg)   SpO2 99%   BMI 33.45 kg/m   BP Readings from Last 3 Encounters:  08/05/17 122/76  06/11/17 132/80  06/04/17 130/88    Wt Readings from Last 3 Encounters:  08/05/17 201 lb (91.2 kg)  06/11/17 200 lb (90.7 kg)  06/04/17 201 lb 6.4 oz (91.4 kg)    Physical Exam  Constitutional: She appears well-developed. No distress.  HENT:  Head: Normocephalic.    Right Ear: External ear normal.  Left Ear: External ear normal.  Nose: Nose normal.  Mouth/Throat: Oropharynx is clear and moist.  Eyes: Pupils are equal, round, and reactive to light. Conjunctivae are normal. Right eye exhibits no discharge. Left eye exhibits no discharge.  Neck: Normal range of motion. Neck supple. No JVD present. No tracheal deviation present. No thyromegaly present.  Cardiovascular: Normal rate, regular rhythm and normal heart sounds.  Pulmonary/Chest: No stridor. No respiratory distress. She has no wheezes.  Abdominal: Soft. Bowel sounds are normal. She exhibits no distension and no mass. There is no tenderness. There is no rebound and no guarding.  Musculoskeletal: She exhibits tenderness. She exhibits no edema.  Lymphadenopathy:    She has no cervical adenopathy.  Neurological: She displays normal reflexes. No cranial nerve deficit. She exhibits normal muscle tone. Coordination normal.  Skin: No rash noted. No erythema.  Psychiatric: She has a normal mood and affect. Her behavior is normal. Judgment and thought content normal.  obese  Lab Results  Component Value Date   WBC 15.6 (H) 06/11/2017   HGB 15.8 (H) 06/11/2017   HCT 47.0 (H) 06/11/2017   PLT 223.0 06/11/2017   GLUCOSE 100 (H) 05/16/2017   CHOL 137 03/10/2015   TRIG 194.0 (H) 03/10/2015   HDL 43.10 03/10/2015   LDLCALC 55 03/10/2015   ALT 31 09/04/2016   AST 47 (H) 09/04/2016   NA 138 05/16/2017   K 4.2 05/16/2017   CL 98 05/16/2017   CREATININE 0.68 05/16/2017   BUN 6 05/16/2017   CO2 33 (H) 05/16/2017   TSH 0.83 05/16/2017   HGBA1C 6.4 09/04/2016    Dg Chest 2 View  Result Date: 06/11/2017 CLINICAL DATA:  RIGHT flank and posterior chest pain EXAM: CHEST  2 VIEW COMPARISON:  06/25/2016 FINDINGS: Normal heart size, mediastinal contours, and pulmonary vascularity. Lungs clear. No pleural effusion or pneumothorax. Bones unremarkable. IMPRESSION: Normal exam. Electronically Signed   By: Lavonia Dana M.D.   On: 06/11/2017 16:25   Dg Foot Complete Left  Result Date: 06/11/2017 Please see detailed radiograph report in office note.   Assessment & Plan:   There are no diagnoses linked to this encounter. I have discontinued Niyana Chesbro. Curet's ARIPiprazole. I am also having her maintain her ALPRAZolam, NITRO-BID, traMADol, albuterol, fluticasone, montelukast, omeprazole, levothyroxine, mometasone-formoterol, ondansetron, aspirin EC, potassium chloride, albuterol, meloxicam, estradiol, valACYclovir, and perphenazine. We will continue to administer sodium chloride.  No orders of the defined types were placed in this encounter.    Follow-up: No follow-ups on file.  Walker Kehr, MD

## 2017-08-05 NOTE — Assessment & Plan Note (Signed)
RLS Gabapentin Vit D

## 2017-08-05 NOTE — Progress Notes (Deleted)
Subjective:   Heidi Good is a 57 y.o. female who presents for an Initial Medicare Annual Wellness Visit.  Review of Systems    No ROS.  Medicare Wellness Visit. Additional risk factors are reflected in the social history.     Sleep patterns: {SX; SLEEP PATTERNS:18802::"feels rested on waking","does not get up to void","gets up *** times nightly to void","sleeps *** hours nightly"}.    Home Safety/Smoke Alarms: Feels safe in home. Smoke alarms in place.  Living environment; residence and Firearm Safety: {Rehab home environment / accessibility:30080::"no firearms","firearms stored safely"}. Seat Belt Safety/Bike Helmet: Wears seat belt.    Objective:    There were no vitals filed for this visit. There is no height or weight on file to calculate BMI.  Advanced Directives 03/20/2017 09/24/2016 06/18/2016  Does Patient Have a Medical Advance Directive? No No No  Would patient like information on creating a medical advance directive? No - Patient declined No - Patient declined -    Current Medications (verified) Outpatient Encounter Medications as of 08/05/2017  Medication Sig  . albuterol (PROVENTIL HFA;VENTOLIN HFA) 108 (90 Base) MCG/ACT inhaler Inhale 1-2 puffs into the lungs every 6 (six) hours as needed for wheezing or shortness of breath.  Marland Kitchen albuterol (PROVENTIL) (2.5 MG/3ML) 0.083% nebulizer solution Take 3 mLs (2.5 mg total) by nebulization every 6 (six) hours as needed for wheezing or shortness of breath.  . ALPRAZolam (XANAX) 0.5 MG tablet Take 1 tablet (0.5 mg total) by mouth at bedtime as needed for anxiety.  . ARIPiprazole (ABILIFY) 15 MG tablet Take 15 mg by mouth daily.  Marland Kitchen aspirin EC 81 MG tablet Take 1 tablet (81 mg total) by mouth daily.  Marland Kitchen estradiol (ESTRACE) 1 MG tablet Take 1 tablet (1 mg total) by mouth daily.  . fluticasone (FLONASE) 50 MCG/ACT nasal spray Place 2 sprays into both nostrils daily.  Marland Kitchen levothyroxine (SYNTHROID, LEVOTHROID) 75 MCG tablet Take 1  tablet (75 mcg total) by mouth daily.  . meloxicam (MOBIC) 15 MG tablet Take 1 tablet (15 mg total) by mouth daily.  . mometasone-formoterol (DULERA) 200-5 MCG/ACT AERO Inhale 2 puffs into the lungs 2 (two) times daily.  . montelukast (SINGULAIR) 10 MG tablet Take 1 tablet (10 mg total) by mouth at bedtime.  Marland Kitchen NITRO-BID 2 % ointment Place 1 application rectally. As needed  . omeprazole (PRILOSEC) 40 MG capsule Take 1 capsule (40 mg total) by mouth daily.  . ondansetron (ZOFRAN) 8 MG tablet Take 1 tablet (8 mg total) by mouth every 8 (eight) hours as needed for nausea or vomiting.  . potassium chloride (KLOR-CON) 8 MEQ tablet Take 1 tablet (8 mEq total) by mouth daily.  . traMADol (ULTRAM) 50 MG tablet Take 1-2 tablets (50-100 mg total) by mouth 2 (two) times daily as needed for severe pain.  . valACYclovir (VALTREX) 1000 MG tablet Take 1 tablet (1,000 mg total) by mouth 3 (three) times daily.   Facility-Administered Encounter Medications as of 08/05/2017  Medication  . 0.9 %  sodium chloride infusion    Allergies (verified) Doxycycline; Hydrocodone; and Other   History: Past Medical History:  Diagnosis Date  . Anxiety   . Constipation   . Deafness   . Depression   . Gastroparesis   . Hyperlipemia   . Hypothyroidism   . Type II or unspecified type diabetes mellitus without mention of complication, not stated as uncontrolled   . Unspecified constipation 07/17/2013   Past Surgical History:  Procedure Laterality Date  .  ABDOMINAL HYSTERECTOMY    . ANAL RECTAL MANOMETRY N/A 03/07/2017   Procedure: ANO RECTAL MANOMETRY;  Surgeon: Mauri Pole, MD;  Location: [facility];  Service: Endoscopy;  Laterality: N/A;   Family History  Problem Relation Age of Onset  . Diabetes Mother   . Colon polyps Mother        over 150 polyps detected  . Heart disease Father   . Multiple myeloma Father   . Emphysema Father   . Heart attack Father   . Liver cancer Maternal Grandmother         not primary source, spread to her liver  . Heart attack Maternal Grandfather   . Emphysema Paternal Grandfather   . Colon cancer Neg Hx   . Esophageal cancer Neg Hx   . Rectal cancer Neg Hx   . Stomach cancer Neg Hx    Social History   Socioeconomic History  . Marital status: Divorced    Spouse name: Not on file  . Number of children: 0  . Years of education: Not on file  . Highest education level: Not on file  Occupational History  . Occupation: disablied    Employer: RETIRED  Social Needs  . Financial resource strain: Not on file  . Food insecurity:    Worry: Not on file    Inability: Not on file  . Transportation needs:    Medical: Not on file    Non-medical: Not on file  Tobacco Use  . Smoking status: Never Smoker  . Smokeless tobacco: Never Used  Substance and Sexual Activity  . Alcohol use: No  . Drug use: No  . Sexual activity: Not on file  Lifestyle  . Physical activity:    Days per week: Not on file    Minutes per session: Not on file  . Stress: Not on file  Relationships  . Social connections:    Talks on phone: Not on file    Gets together: Not on file    Attends religious service: Not on file    Active member of club or organization: Not on file    Attends meetings of clubs or organizations: Not on file    Relationship status: Not on file  Other Topics Concern  . Not on file  Social History Narrative  . Not on file    Tobacco Counseling Counseling given: Not Answered   Activities of Daily Living No flowsheet data found.   Immunizations and Health Maintenance Immunization History  Administered Date(s) Administered  . Influenza Split 02/11/2011, 02/24/2012  . Influenza,inj,Quad PF,6+ Mos 02/16/2013, 01/26/2014, 01/25/2015, 01/16/2016, 02/03/2017  . Pneumococcal Conjugate-13 03/06/2016  . Tdap 03/08/2015   Health Maintenance Due  Topic Date Due  . Hepatitis C Screening  [birth year]  . PNEUMOCOCCAL POLYSACCHARIDE VACCINE (1) 06/21/1963    . FOOT EXAM  06/21/1971  . OPHTHALMOLOGY EXAM  06/21/1971  . URINE MICROALBUMIN  06/21/1971  . HIV Screening  06/20/1976  . HEMOGLOBIN A1C  03/06/2017    Patient Care Team: Plotnikov, Evie Lacks, MD as PCP - General (Internal Medicine) [name], MD as Consulting Physician (Pulmonary Disease)  Indicate any recent Medical Services you may have received from other than [facility] providers in the past year (date may be approximate).     Assessment:   This is a routine wellness examination for Heidi Good. Physical assessment deferred to PCP.   Hearing/Vision screen No exam data present  Dietary issues and exercise activities discussed:   Diet (meal preparation, eat out,  water intake, caffeinated beverages, dairy products, fruits and vegetables): {Desc; diets:16563}      Goals    None     Depression Screen PHQ 2/9 Scores 09/04/2016  Exception Documentation Medical reason    Fall Risk Fall Risk  09/04/2016  Falls in the past year? Yes  Number falls in past yr: 1  Injury with Fall? Yes   Cognitive Function:        Screening Tests Health Maintenance  Topic Date Due  . Hepatitis C Screening  Aug 12, 1960  . PNEUMOCOCCAL POLYSACCHARIDE VACCINE (1) 06/21/1963  . FOOT EXAM  06/21/1971  . OPHTHALMOLOGY EXAM  06/21/1971  . URINE MICROALBUMIN  06/21/1971  . HIV Screening  06/20/1976  . HEMOGLOBIN A1C  03/06/2017  . MAMMOGRAM  03/05/2019  . TETANUS/TDAP  03/07/2025  . COLONOSCOPY  06/18/2026  . INFLUENZA VACCINE  Completed     Plan:     I have personally reviewed and noted the following in the patient's chart:   . Medical and social history . Use of alcohol, tobacco or illicit drugs  . Current medications and supplements . Functional ability and status . Nutritional status . Physical activity . Advanced directives . List of other physicians . Vitals . Screenings to include cognitive, depression, and falls . Referrals and appointments  In addition, I have reviewed  and discussed with patient certain preventive protocols, quality metrics, and best practice recommendations. A written personalized care plan for preventive services as well as general preventive health recommendations were provided to patient.     Michiel Cowboy, RN   08/05/2017

## 2017-08-05 NOTE — Assessment & Plan Note (Signed)
Aggravated by RLS Gabapentin

## 2017-08-05 NOTE — Assessment & Plan Note (Signed)
Vit D Gabapentin

## 2017-08-22 ENCOUNTER — Telehealth: Payer: Self-pay | Admitting: Internal Medicine
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
## 2017-08-22 NOTE — Telephone Encounter (Signed)
Copied from [place] [phone]. Topic: Quick Communication - Rx Refill/Question >> Aug 22, 2017  2:23 PM Neva Seat wrote: traMADol (ULTRAM) 50 MG tablet  Pt needing refills.  [facility] [address] [place] [address] Phone: [phone] Fax: [phone]

## 2017-08-25 ENCOUNTER — Encounter: Payer: Self-pay | Admitting: Family

## 2017-08-25 ENCOUNTER — Other Ambulatory Visit: Payer: Self-pay | Admitting: Family

## 2017-08-25 ENCOUNTER — Ambulatory Visit (INDEPENDENT_AMBULATORY_CARE_PROVIDER_SITE_OTHER): Payer: Medicare Other | Admitting: Family

## 2017-08-25 ENCOUNTER — Ambulatory Visit (INDEPENDENT_AMBULATORY_CARE_PROVIDER_SITE_OTHER)
Admission: RE | Admit: 2017-08-25 | Discharge: 2017-08-25 | Disposition: A | Discharge status: Home / Self Care | Payer: Medicare Other | Source: Ambulatory Visit | Attending: Family | Admitting: Family

## 2017-08-25 VITALS — BP 118/78 | HR 85 | Temp 98.8°F | Ht 65.0 in | Wt 201.0 lb

## 2017-08-25 DIAGNOSIS — M7732 Calcaneal spur, left foot: Secondary | ICD-10-CM | POA: Diagnosis not present

## 2017-08-25 DIAGNOSIS — M79672 Pain in left foot: Secondary | ICD-10-CM

## 2017-08-25 MED ORDER — DICLOFENAC SODIUM 75 MG PO TBEC: 75.0000 mg | DELAYED_RELEASE_TABLET | Freq: Two times a day (BID) | ORAL | 0 refills | Status: DC

## 2017-08-25 MED ORDER — OMEPRAZOLE 40 MG PO CPDR: 40.0000 mg | DELAYED_RELEASE_CAPSULE | Freq: Every day | ORAL | 0 refills | Status: DC

## 2017-08-25 NOTE — Telephone Encounter (Signed)
FYI

## 2017-08-25 NOTE — Progress Notes (Signed)
Heidi Good is a 57 y.o. female with the following history as recorded in Epic[employer]:  Patient Active Problem List   Diagnosis Date Noted  . Restless legs syndrome (RLS) 08/05/2017  . Insomnia 08/05/2017  . Paresthesia 04/07/2017  . Foot pain, left 04/07/2017  . Plantar fasciitis 02/26/2017  . Chest pain, atypical 12/04/2016  . Snoring 09/25/2016  . Arthralgia 09/04/2016  . GERD (gastroesophageal reflux disease) 04/03/2016  . Fatigue 08/02/2015  . Acute sinusitis 08/02/2015  . Hip pain, bilateral 06/01/2015  . Prediabetes 03/08/2015  . Hyperlipidemia 03/08/2015  . Constipation 07/17/2013  . Seasonal and perennial allergic rhinitis 08/28/2011  . Asthma, mild intermittent 08/28/2011  . Obstructive sleep apnea 08/28/2011  . Gastroparesis 04/25/2010  . IBS 08/16/2008  . Hypothyroidism 07/06/2008  . Schizoaffective disorder ([place]) 07/06/2008  . Major depressive disorder, single episode, moderate ([place]) 07/06/2008  . Hearing loss 07/06/2008    Current Outpatient Medications  Medication Sig Dispense Refill  . albuterol (PROVENTIL HFA;VENTOLIN HFA) 108 (90 Base) MCG/ACT inhaler Inhale 1-2 puffs into the lungs every 6 (six) hours as needed for wheezing or shortness of breath. 1 Inhaler 5  . albuterol (PROVENTIL) (2.5 MG/3ML) 0.083% nebulizer solution Take 3 mLs (2.5 mg total) by nebulization every 6 (six) hours as needed for wheezing or shortness of breath. 75 mL 5  . aspirin EC 81 MG tablet Take 1 tablet (81 mg total) by mouth daily. 100 tablet 3  . Cholecalciferol (VITAMIN D3) 2000 units capsule Take 1 capsule (2,000 Units total) by mouth daily. 100 capsule 3  . ergocalciferol (VITAMIN D2) 50000 units capsule Take 1 capsule (50,000 Units total) by mouth once a week. 6 capsule 0  . estradiol (ESTRACE) 1 MG tablet Take 1 tablet (1 mg total) by mouth daily. 90 tablet 1  . fluticasone (FLONASE) 50 MCG/ACT nasal spray Place 2 sprays into both nostrils daily. 16 g 2  . levothyroxine  (SYNTHROID, LEVOTHROID) 75 MCG tablet Take 1 tablet (75 mcg total) by mouth daily. 90 tablet 3  . mometasone-formoterol (DULERA) 200-5 MCG/ACT AERO Inhale 2 puffs into the lungs 2 (two) times daily. 1 Inhaler 5  . montelukast (SINGULAIR) 10 MG tablet Take 1 tablet (10 mg total) by mouth at bedtime. 30 tablet 5  . NITRO-BID 2 % ointment Place 1 application rectally. As needed    . omeprazole (PRILOSEC) 40 MG capsule Take 1 capsule (40 mg total) by mouth daily. 90 capsule 0  . ondansetron (ZOFRAN) 8 MG tablet Take 1 tablet (8 mg total) by mouth every 8 (eight) hours as needed for nausea or vomiting. 30 tablet 1  . perphenazine (TRILAFON) 4 MG tablet TK 1 T PO QHS FOR 4 DAYS THEN TK 2 TS HS THEREAFTER  1  . potassium chloride (KLOR-CON) 8 MEQ tablet Take 1 tablet (8 mEq total) by mouth daily. 90 tablet 3  . traMADol (ULTRAM) 50 MG tablet Take 1-2 tablets (50-100 mg total) by mouth 2 (two) times daily as needed for severe pain. 100 tablet 1  . diclofenac (VOLTAREN) 75 MG EC tablet Take 1 tablet (75 mg total) by mouth 2 (two) times daily. 30 tablet 0   Current Facility-Administered Medications  Medication Dose Route Frequency Provider Last Rate Last Dose  . 0.9 %  sodium chloride infusion  500 mL Intravenous Continuous Doran Stabler, MD        Allergies: Doxycycline; Hydrocodone; and Other  Past Medical History:  Diagnosis Date  . Anxiety   . Constipation   .  Deafness   . Depression   . Gastroparesis   . Hyperlipemia   . Hypothyroidism   . Type II or unspecified type diabetes mellitus without mention of complication, not stated as uncontrolled   . Unspecified constipation 07/17/2013    Past Surgical History:  Procedure Laterality Date  . ABDOMINAL HYSTERECTOMY    . ANAL RECTAL MANOMETRY N/A 03/07/2017   Procedure: ANO RECTAL MANOMETRY;  Surgeon: Mauri Pole, MD;  Location: [facility];  Service: Endoscopy;  Laterality: N/A;    Family History  Problem Relation Age of Onset   . Diabetes Mother   . Colon polyps Mother        over 150 polyps detected  . Heart disease Father   . Multiple myeloma Father   . Emphysema Father   . Heart attack Father   . Liver cancer Maternal Grandmother        not primary source, spread to her liver  . Heart attack Maternal Grandfather   . Emphysema Paternal Grandfather   . Colon cancer Neg Hx   . Esophageal cancer Neg Hx   . Rectal cancer Neg Hx   . Stomach cancer Neg Hx     Social History   Tobacco Use  . Smoking status: Never Smoker  . Smokeless tobacco: Never Used  Substance Use Topics  . Alcohol use: No    Subjective:  Left foot pain x 2 week; no injury or trauma; history of surgery on this foot in the past; localized area of swelling on top of left foot; had surgery on the left foot in the past 5 years; notes that pain is affecting her ability to walk/ bear weight;   Objective:  Vitals:   08/25/17 0904  BP: 118/78  Pulse: 85  Temp: 98.8 F (37.1 C)  TempSrc: Oral  SpO2: 98%  Weight: 201 lb (91.2 kg)  Height: _0  (1.651 m)    General: Well developed, well nourished, in no acute distress  Skin : Warm and dry.  Head: Normocephalic and atraumatic  Lungs: Respirations unlabored; clear to auscultation bilaterally without wheeze, rales, rhonchi  Musculoskeletal: No deformities; localized area of swelling noted on top of foot Extremities: No edema, cyanosis, clubbing  Vessels: Symmetric bilaterally  Neurologic: Alert and oriented; speech intact; face symmetrical; moves all extremities well; CNII-XII intact without focal deficit  Assessment:  1. Left foot pain     Plan:  STAT x-ray done today does not show any type of fracture; refer back to her podiatrist for further evaluation; refill given on Voltaren which her podiatrist has used in the past; Keep planned follow-up with her PCP as scheduled.   No follow-ups on file.  Orders Placed This Encounter  Procedures  . DG Foot Complete Left    Standing  Status:   Future    Number of Occurrences:   1    Standing Expiration Date:   10/26/2018    Order Specific Question:   Reason for Exam (SYMPTOM  OR DIAGNOSIS REQUIRED)    Answer:   left foot pain/ swelling    Order Specific Question:   Is patient pregnant?    Answer:   No    Order Specific Question:   Preferred imaging location?    Answer:   [facility]    Order Specific Question:   Radiology Contrast Protocol - do NOT remove file path    Answer:   [email or web address]  . Ambulatory referral to Podiatry    Referral Priority:  Routine    Referral Type:   Consultation    Referral Reason:   Specialty Services Required    Referred to Provider:   Wallene Huh, DPM    Requested Specialty:   Podiatry    Number of Visits Requested:   1    Requested Prescriptions   Signed Prescriptions Disp Refills  . omeprazole (PRILOSEC) 40 MG capsule 90 capsule 0    Sig: Take 1 capsule (40 mg total) by mouth daily.  . diclofenac (VOLTAREN) 75 MG EC tablet 30 tablet 0    Sig: Take 1 tablet (75 mg total) by mouth 2 (two) times daily.

## 2017-09-02 DIAGNOSIS — F200 Paranoid schizophrenia: Secondary | ICD-10-CM | POA: Diagnosis not present

## 2017-09-05 ENCOUNTER — Encounter: Payer: Self-pay | Admitting: Podiatry

## 2017-09-05 ENCOUNTER — Ambulatory Visit (INDEPENDENT_AMBULATORY_CARE_PROVIDER_SITE_OTHER): Payer: Medicare Other | Admitting: Podiatry

## 2017-09-05 DIAGNOSIS — M19079 Primary osteoarthritis, unspecified ankle and foot: Secondary | ICD-10-CM | POA: Diagnosis not present

## 2017-09-07 NOTE — Progress Notes (Signed)
  Subjective:  Patient ID: Heidi Good, female    DOB: 13-Apr-1961,  MRN: [id number]  No chief complaint on file.  57 y.o. female returns for the above complaint.  Reports loss of pain to the top of the left foot.  Hurts with different shoes.  Objective:  There were no vitals filed for this visit. General AA&O x3. Normal mood and affect.  Vascular Pedal pulses palpable.  Neurologic Epicritic sensation grossly intact.  Dermatologic No open lesions. Skin normal texture and turgor.  Orthopedic:  Pain palpation dorsal left midfoot with prominent osteophytes   Assessment & Plan:  Patient was evaluated and treated and all questions answered.  Midfoot Arthritis L -X-rays taken and reviewed.  Midfoot arthritis.  No acute fractures dislocations -Injection deliverd as below.  Procedure: Joint Injection Location: Left 2nd/3rd dorsal TMT joint Skin Prep: Alcohol. Injectate: 0.5 cc 1% lidocaine plain, 0.5 cc dexamethasone phosphate. Disposition: Patient tolerated procedure well. Injection site dressed with a band-aid.  Return if symptoms worsen or fail to improve.

## 2017-09-23 DIAGNOSIS — L821 Other seborrheic keratosis: Secondary | ICD-10-CM | POA: Diagnosis not present

## 2017-09-23 DIAGNOSIS — D225 Melanocytic nevi of trunk: Secondary | ICD-10-CM | POA: Diagnosis not present

## 2017-09-23 DIAGNOSIS — D692 Other nonthrombocytopenic purpura: Secondary | ICD-10-CM | POA: Diagnosis not present

## 2017-09-23 DIAGNOSIS — D1801 Hemangioma of skin and subcutaneous tissue: Secondary | ICD-10-CM | POA: Diagnosis not present

## 2017-09-23 DIAGNOSIS — L820 Inflamed seborrheic keratosis: Secondary | ICD-10-CM | POA: Diagnosis not present

## 2017-10-01 ENCOUNTER — Other Ambulatory Visit: Payer: Self-pay

## 2017-10-01 MED ORDER — ERGOCALCIFEROL 1.25 MG (50000 UT) PO CAPS: 50000.0000 [IU] | ORAL_CAPSULE | ORAL | 0 refills | Status: DC

## 2017-10-10 ENCOUNTER — Encounter: Payer: Self-pay | Admitting: Podiatry

## 2017-10-10 ENCOUNTER — Ambulatory Visit (INDEPENDENT_AMBULATORY_CARE_PROVIDER_SITE_OTHER): Payer: Medicare Other | Admitting: Podiatry

## 2017-10-10 DIAGNOSIS — L600 Ingrowing nail: Secondary | ICD-10-CM | POA: Diagnosis not present

## 2017-10-10 NOTE — Patient Instructions (Signed)

## 2017-10-13 NOTE — Progress Notes (Signed)
Subjective:   Patient ID: Heidi Good, female   DOB: 57 y.o.   MRN: [id number]   HPI Patient presents stating she is an ingrown toenail of the third toe that has grown back it is tender and makes it hard for her to wear shoe gear comfortably   ROS      Objective:  Physical Exam  Neurovascular status intact with incurvated third nail lateral side right that is painful when pressed and make shoe gear difficult     Assessment:  Chronic ingrown toenail deformity third nail right that she cannot take care of herself     Plan:  H&P condition reviewed and recommended removal of the corner of the nail.  Explained procedure and risk and patient wants procedure in today I infiltrated 60 mg like Marcaine mixture removed the spicule of the third nail and expose the matrix applied phenol 3 applications 30 seconds followed by alcohol lavage sterile dressing.  Gave instructions on soaks and reappoint

## 2017-11-05 ENCOUNTER — Other Ambulatory Visit (INDEPENDENT_AMBULATORY_CARE_PROVIDER_SITE_OTHER): Payer: Medicare Other

## 2017-11-05 ENCOUNTER — Encounter: Payer: Self-pay | Admitting: Internal Medicine

## 2017-11-05 ENCOUNTER — Ambulatory Visit (INDEPENDENT_AMBULATORY_CARE_PROVIDER_SITE_OTHER): Payer: Medicare Other | Admitting: Internal Medicine

## 2017-11-05 VITALS — BP 134/82 | HR 81 | Temp 98.7°F | Ht 65.0 in | Wt 202.0 lb

## 2017-11-05 DIAGNOSIS — J3081 Allergic rhinitis due to animal (cat) (dog) hair and dander: Secondary | ICD-10-CM | POA: Diagnosis not present

## 2017-11-05 DIAGNOSIS — H903 Sensorineural hearing loss, bilateral: Secondary | ICD-10-CM

## 2017-11-05 DIAGNOSIS — E034 Atrophy of thyroid (acquired): Secondary | ICD-10-CM | POA: Diagnosis not present

## 2017-11-05 DIAGNOSIS — J309 Allergic rhinitis, unspecified: Secondary | ICD-10-CM | POA: Insufficient documentation

## 2017-11-05 LAB — BASIC METABOLIC PANEL
BUN: 6 mg/dL (ref 6–23)
CHLORIDE: 101 meq/L (ref 96–112)
CO2: 30 mEq/L (ref 19–32)
Calcium: 9.4 mg/dL (ref 8.4–10.5)
Creatinine, Ser: 0.68 mg/dL (ref 0.40–1.20)
GFR: 95 mL/min (ref >60.00)
GLUCOSE: 94 mg/dL (ref 70–99)
POTASSIUM: 4.2 meq/L (ref 3.5–5.1)
Sodium: 138 mEq/L (ref 135–145)

## 2017-11-05 LAB — TSH: TSH: 0.91 u[IU]/mL (ref 0.35–4.50)

## 2017-11-05 MED ORDER — LORATADINE 10 MG PO TABS: 10.0000 mg | ORAL_TABLET | Freq: Every day | ORAL | 3 refills | Status: DC

## 2017-11-05 MED ORDER — METHYLPREDNISOLONE ACETATE 80 MG/ML IJ SUSP
80.0000 mg | Freq: Once | INTRAMUSCULAR | Status: AC
  Administered 2017-11-05: 80 mg via INTRAMUSCULAR

## 2017-11-05 MED ORDER — ESTRADIOL 1 MG PO TABS
1.0000 mg | ORAL_TABLET | Freq: Every day | ORAL | 3 refills | Status: DC
Start: 2017-11-05 — End: 2018-04-30

## 2017-11-05 NOTE — Progress Notes (Signed)
Subjective:  Patient ID: Heidi Good, female    DOB: [birth year]  Age: 57 y.o. MRN: [id number]  CC: No chief complaint on file.   HPI Shadoe Cryan [facility] presents for allergies - sneezing at home x weeks; 5 dogs  Outpatient Medications Prior to Visit  Medication Sig Dispense Refill  . albuterol (PROVENTIL HFA;VENTOLIN HFA) 108 (90 Base) MCG/ACT inhaler Inhale 1-2 puffs into the lungs every 6 (six) hours as needed for wheezing or shortness of breath. 1 Inhaler 5  . albuterol (PROVENTIL) (2.5 MG/3ML) 0.083% nebulizer solution Take 3 mLs (2.5 mg total) by nebulization every 6 (six) hours as needed for wheezing or shortness of breath. 75 mL 5  . aspirin EC 81 MG tablet Take 1 tablet (81 mg total) by mouth daily. 100 tablet 3  . Cholecalciferol (VITAMIN D3) 2000 units capsule Take 1 capsule (2,000 Units total) by mouth daily. 100 capsule 3  . diclofenac (VOLTAREN) 75 MG EC tablet Take 1 tablet (75 mg total) by mouth 2 (two) times daily. 30 tablet 0  . ergocalciferol (VITAMIN D2) 50000 units capsule Take 1 capsule (50,000 Units total) by mouth once a week. 6 capsule 0  . estradiol (ESTRACE) 1 MG tablet Take 1 tablet (1 mg total) by mouth daily. 90 tablet 1  . fluticasone (FLONASE) 50 MCG/ACT nasal spray Place 2 sprays into both nostrils daily. 16 g 2  . levothyroxine (SYNTHROID, LEVOTHROID) 75 MCG tablet Take 1 tablet (75 mcg total) by mouth daily. 90 tablet 3  . loxapine (LOXITANE) 10 MG capsule   1  . mometasone-formoterol (DULERA) 200-5 MCG/ACT AERO Inhale 2 puffs into the lungs 2 (two) times daily. 1 Inhaler 5  . montelukast (SINGULAIR) 10 MG tablet Take 1 tablet (10 mg total) by mouth at bedtime. 30 tablet 5  . NITRO-BID 2 % ointment Place 1 application rectally. As needed    . omeprazole (PRILOSEC) 40 MG capsule Take 1 capsule (40 mg total) by mouth daily. 90 capsule 0  . potassium chloride (KLOR-CON) 8 MEQ tablet Take 1 tablet (8 mEq total) by mouth daily. 90 tablet 3  . ondansetron  (ZOFRAN) 8 MG tablet Take 1 tablet (8 mg total) by mouth every 8 (eight) hours as needed for nausea or vomiting. 30 tablet 1  . perphenazine (TRILAFON) 4 MG tablet TK 1 T PO QHS FOR 4 DAYS THEN TK 2 TS HS THEREAFTER  1  . traMADol (ULTRAM) 50 MG tablet Take 1-2 tablets (50-100 mg total) by mouth 2 (two) times daily as needed for severe pain. 100 tablet 1   Facility-Administered Medications Prior to Visit  Medication Dose Route Frequency Provider Last Rate Last Dose  . 0.9 %  sodium chloride infusion  500 mL Intravenous Continuous Danis, Estill Cotta III, MD        ROS: Review of Systems  Constitutional: Negative for activity change, appetite change, chills, fatigue and unexpected weight change.  HENT: Positive for congestion, postnasal drip and rhinorrhea. Negative for mouth sores and sinus pressure.   Eyes: Negative for visual disturbance.  Respiratory: Negative for cough and chest tightness.   Gastrointestinal: Negative for abdominal pain and nausea.  Genitourinary: Negative for difficulty urinating, frequency and vaginal pain.  Musculoskeletal: Negative for back pain and gait problem.  Skin: Negative for pallor and rash.  Neurological: Negative for dizziness, tremors, weakness, numbness and headaches.  Psychiatric/Behavioral: Negative for confusion and sleep disturbance.    Objective:  BP 134/82 (BP Location: Left Arm, Patient Position: Sitting,  Cuff Size: Large)   Pulse 81   Temp 98.7 F (37.1 C) (Oral)   Ht 5\' 5"  (1.651 m)   Wt 202 lb (91.6 kg)   SpO2 98%   BMI 33.61 kg/m   BP Readings from Last 3 Encounters:  11/05/17 134/82  08/25/17 118/78  08/05/17 122/76    Wt Readings from Last 3 Encounters:  11/05/17 202 lb (91.6 kg)  08/25/17 201 lb (91.2 kg)  08/05/17 201 lb (91.2 kg)    Physical Exam  Constitutional: She appears well-developed. No distress.  HENT:  Head: Normocephalic.  Right Ear: External ear normal.  Left Ear: External ear normal.  Nose: Nose normal.    Mouth/Throat: Oropharynx is clear and moist.  Eyes: Pupils are equal, round, and reactive to light. Conjunctivae are normal. Right eye exhibits no discharge. Left eye exhibits no discharge.  Neck: Normal range of motion. Neck supple. No JVD present. No tracheal deviation present. No thyromegaly present.  Cardiovascular: Normal rate, regular rhythm and normal heart sounds.  Pulmonary/Chest: No stridor. No respiratory distress. She has no wheezes.  Abdominal: Soft. Bowel sounds are normal. She exhibits no distension and no mass. There is no tenderness. There is no rebound and no guarding.  Musculoskeletal: She exhibits no edema or tenderness.  Lymphadenopathy:    She has no cervical adenopathy.  Neurological: She displays normal reflexes. No cranial nerve deficit. She exhibits normal muscle tone. Coordination normal.  Skin: No rash noted. No erythema.  Psychiatric: She has a normal mood and affect. Her behavior is normal. Judgment and thought content normal.   Swollen nasal lining  Lab Results  Component Value Date   WBC 15.6 (H) 06/11/2017   HGB 15.8 (H) 06/11/2017   HCT 47.0 (H) 06/11/2017   PLT 223.0 06/11/2017   GLUCOSE 100 (H) 05/16/2017   CHOL 137 03/10/2015   TRIG 194.0 (H) 03/10/2015   HDL 43.10 03/10/2015   LDLCALC 55 03/10/2015   ALT 31 09/04/2016   AST 47 (H) 09/04/2016   NA 138 05/16/2017   K 4.2 05/16/2017   CL 98 05/16/2017   CREATININE 0.68 05/16/2017   BUN 6 05/16/2017   CO2 33 (H) 05/16/2017   TSH 0.83 05/16/2017   HGBA1C 6.4 09/04/2016    Dg Foot Complete Left  Result Date: 08/25/2017 CLINICAL DATA:  Anterior pain and swelling over the last 7 days. EXAM: LEFT FOOT - COMPLETE 3+ VIEW COMPARISON:  06/11/2017 FINDINGS: No acute finding. Previous osteotomies of the first and fifth metatarsals with K-wire and screw. Old healed fracture of the proximal phalanx of the second toe. No evidence of recent fracture. Mild osteoarthritis of the midfoot. Small calcaneal  spurs. IMPRESSION: Chronic findings as above. No abnormality seen to explain acute pain and swelling. Electronically Signed   By: Nelson Chimes M.D.   On: 08/25/2017 09:43    Assessment & Plan:   There are no diagnoses linked to this encounter.   No orders of the defined types were placed in this encounter.    Follow-up: No follow-ups on file.  Walker Kehr, MD

## 2017-11-05 NOTE — Assessment & Plan Note (Signed)
Using an interpreter

## 2017-11-05 NOTE — Assessment & Plan Note (Signed)
Levothroid 

## 2017-11-05 NOTE — Assessment & Plan Note (Addendum)
Worse  Nasal rinse Claritin qd DepoMedrol IM 80 mg

## 2017-11-05 NOTE — Addendum Note (Signed)
Addended by: Karren Cobble on: 11/05/2017 02:02 PM   Modules accepted: Orders

## 2017-11-06 DIAGNOSIS — F200 Paranoid schizophrenia: Secondary | ICD-10-CM | POA: Diagnosis not present

## 2017-11-21 ENCOUNTER — Ambulatory Visit (INDEPENDENT_AMBULATORY_CARE_PROVIDER_SITE_OTHER): Payer: Medicare Other | Admitting: Internal Medicine

## 2017-11-21 ENCOUNTER — Encounter: Payer: Self-pay | Admitting: Internal Medicine

## 2017-11-21 DIAGNOSIS — H00015 Hordeolum externum left lower eyelid: Secondary | ICD-10-CM | POA: Diagnosis not present

## 2017-11-21 DIAGNOSIS — H00019 Hordeolum externum unspecified eye, unspecified eyelid: Secondary | ICD-10-CM | POA: Insufficient documentation

## 2017-11-21 MED ORDER — SULFAMETHOXAZOLE-TRIMETHOPRIM 400-80 MG PO TABS: 1.0000 | ORAL_TABLET | Freq: Two times a day (BID) | ORAL | 0 refills | Status: DC

## 2017-11-21 MED ORDER — ERYTHROMYCIN 5 MG/GM OP OINT: 1.0000 "application " | TOPICAL_OINTMENT | Freq: Every day | OPHTHALMIC | 0 refills | Status: DC

## 2017-11-21 NOTE — Assessment & Plan Note (Signed)
Erythro oint qhs Bactrim DS

## 2017-11-21 NOTE — Patient Instructions (Signed)

## 2017-11-21 NOTE — Progress Notes (Signed)
Subjective:  Patient ID: Heidi Good, female    DOB: 07-23-1960  Age: 57 y.o. MRN: [id number]  CC: No chief complaint on file.   HPI Heidi Good [facility] presents for L eye swelling since Monday, pain  Outpatient Medications Prior to Visit  Medication Sig Dispense Refill  . albuterol (PROVENTIL HFA;VENTOLIN HFA) 108 (90 Base) MCG/ACT inhaler Inhale 1-2 puffs into the lungs every 6 (six) hours as needed for wheezing or shortness of breath. 1 Inhaler 5  . albuterol (PROVENTIL) (2.5 MG/3ML) 0.083% nebulizer solution Take 3 mLs (2.5 mg total) by nebulization every 6 (six) hours as needed for wheezing or shortness of breath. 75 mL 5  . aspirin EC 81 MG tablet Take 1 tablet (81 mg total) by mouth daily. 100 tablet 3  . Cholecalciferol (VITAMIN D3) 2000 units capsule Take 1 capsule (2,000 Units total) by mouth daily. 100 capsule 3  . diclofenac (VOLTAREN) 75 MG EC tablet Take 1 tablet (75 mg total) by mouth 2 (two) times daily. 30 tablet 0  . ergocalciferol (VITAMIN D2) 50000 units capsule Take 1 capsule (50,000 Units total) by mouth once a week. 6 capsule 0  . estradiol (ESTRACE) 1 MG tablet Take 1 tablet (1 mg total) by mouth daily. 90 tablet 3  . fluticasone (FLONASE) 50 MCG/ACT nasal spray Place 2 sprays into both nostrils daily. 16 g 2  . levothyroxine (SYNTHROID, LEVOTHROID) 75 MCG tablet Take 1 tablet (75 mcg total) by mouth daily. 90 tablet 3  . loratadine (CLARITIN) 10 MG tablet Take 1 tablet (10 mg total) by mouth daily. 90 tablet 3  . loxapine (LOXITANE) 10 MG capsule   1  . mometasone-formoterol (DULERA) 200-5 MCG/ACT AERO Inhale 2 puffs into the lungs 2 (two) times daily. 1 Inhaler 5  . montelukast (SINGULAIR) 10 MG tablet Take 1 tablet (10 mg total) by mouth at bedtime. 30 tablet 5  . NITRO-BID 2 % ointment Place 1 application rectally. As needed    . omeprazole (PRILOSEC) 40 MG capsule Take 1 capsule (40 mg total) by mouth daily. 90 capsule 0  . potassium chloride (KLOR-CON) 8 MEQ  tablet Take 1 tablet (8 mEq total) by mouth daily. 90 tablet 3   Facility-Administered Medications Prior to Visit  Medication Dose Route Frequency Provider Last Rate Last Dose  . 0.9 %  sodium chloride infusion  500 mL Intravenous Continuous Danis, Estill Cotta III, MD        ROS: Review of Systems  Constitutional: Negative for chills and fever.  Eyes: Positive for pain. Negative for discharge, redness, itching and visual disturbance.  Musculoskeletal: Negative for arthralgias.    Objective:  BP 132/82 (BP Location: Left Arm, Patient Position: Sitting, Cuff Size: Large)   Pulse 67   Temp 98.8 F (37.1 C) (Oral)   Ht 5\' 5"  (1.651 m)   Wt 202 lb (91.6 kg)   SpO2 98%   BMI 33.61 kg/m   BP Readings from Last 3 Encounters:  11/21/17 132/82  11/05/17 134/82  08/25/17 118/78    Wt Readings from Last 3 Encounters:  11/21/17 202 lb (91.6 kg)  11/05/17 202 lb (91.6 kg)  08/25/17 201 lb (91.2 kg)    Physical Exam  Constitutional: She appears well-developed. No distress.  Eyes: Pupils are equal, round, and reactive to light. Conjunctivae are normal. Right eye exhibits no discharge. Left eye exhibits no discharge. No scleral icterus.  Lymphadenopathy:    She has no cervical adenopathy.  Skin: No erythema.  L lower eyelid 3x4 mm stye  Lab Results  Component Value Date   WBC 15.6 (H) 06/11/2017   HGB 15.8 (H) 06/11/2017   HCT 47.0 (H) 06/11/2017   PLT 223.0 06/11/2017   GLUCOSE 94 11/05/2017   CHOL 137 03/10/2015   TRIG 194.0 (H) 03/10/2015   HDL 43.10 03/10/2015   LDLCALC 55 03/10/2015   ALT 31 09/04/2016   AST 47 (H) 09/04/2016   NA 138 11/05/2017   K 4.2 11/05/2017   CL 101 11/05/2017   CREATININE 0.68 11/05/2017   BUN 6 11/05/2017   CO2 30 11/05/2017   TSH 0.91 11/05/2017   HGBA1C 6.4 09/04/2016    Dg Foot Complete Left  Result Date: 08/25/2017 CLINICAL DATA:  Anterior pain and swelling over the last 7 days. EXAM: LEFT FOOT - COMPLETE 3+ VIEW COMPARISON:   06/11/2017 FINDINGS: No acute finding. Previous osteotomies of the first and fifth metatarsals with K-wire and screw. Old healed fracture of the proximal phalanx of the second toe. No evidence of recent fracture. Mild osteoarthritis of the midfoot. Small calcaneal spurs. IMPRESSION: Chronic findings as above. No abnormality seen to explain acute pain and swelling. Electronically Signed   By: Nelson Chimes M.D.   On: 08/25/2017 09:43    Assessment & Plan:   There are no diagnoses linked to this encounter.   No orders of the defined types were placed in this encounter.    Follow-up: No follow-ups on file.  Walker Kehr, MD

## 2018-01-05 DIAGNOSIS — F200 Paranoid schizophrenia: Secondary | ICD-10-CM | POA: Diagnosis not present

## 2018-02-02 ENCOUNTER — Ambulatory Visit (INDEPENDENT_AMBULATORY_CARE_PROVIDER_SITE_OTHER): Payer: Medicare Other | Admitting: Internal Medicine

## 2018-02-02 ENCOUNTER — Other Ambulatory Visit (INDEPENDENT_AMBULATORY_CARE_PROVIDER_SITE_OTHER): Payer: Medicare Other

## 2018-02-02 ENCOUNTER — Encounter: Payer: Self-pay | Admitting: Internal Medicine

## 2018-02-02 VITALS — BP 128/82 | HR 82 | Temp 99.6°F | Ht 65.0 in | Wt 210.0 lb

## 2018-02-02 DIAGNOSIS — R5383 Other fatigue: Secondary | ICD-10-CM | POA: Diagnosis not present

## 2018-02-02 DIAGNOSIS — E049 Nontoxic goiter, unspecified: Secondary | ICD-10-CM

## 2018-02-02 DIAGNOSIS — G4733 Obstructive sleep apnea (adult) (pediatric): Secondary | ICD-10-CM | POA: Diagnosis not present

## 2018-02-02 DIAGNOSIS — Z23 Encounter for immunization: Secondary | ICD-10-CM | POA: Diagnosis not present

## 2018-02-02 DIAGNOSIS — R202 Paresthesia of skin: Secondary | ICD-10-CM | POA: Diagnosis not present

## 2018-02-02 DIAGNOSIS — E034 Atrophy of thyroid (acquired): Secondary | ICD-10-CM

## 2018-02-02 DIAGNOSIS — R7303 Prediabetes: Secondary | ICD-10-CM

## 2018-02-02 DIAGNOSIS — R0683 Snoring: Secondary | ICD-10-CM

## 2018-02-02 DIAGNOSIS — G4700 Insomnia, unspecified: Secondary | ICD-10-CM | POA: Diagnosis not present

## 2018-02-02 LAB — VITAMIN B12: VITAMIN B 12: 1167 pg/mL — AB (ref 211–911)

## 2018-02-02 LAB — CBC WITH DIFFERENTIAL/PLATELET
BASOS ABS: 0.1 10*3/uL (ref 0.0–0.1)
Basophils Relative: 0.8 % (ref 0.0–3.0)
EOS ABS: 0.5 10*3/uL (ref 0.0–0.7)
Eosinophils Relative: 3.6 % (ref 0.0–5.0)
HEMATOCRIT: 43.2 % (ref 36.0–46.0)
Hemoglobin: 14.7 g/dL (ref 12.0–15.0)
LYMPHS ABS: 4.7 10*3/uL — AB (ref 0.7–4.0)
LYMPHS PCT: 31.5 % (ref 12.0–46.0)
MCHC: 34 g/dL (ref 30.0–36.0)
MCV: 90.3 fl (ref 78.0–100.0)
Monocytes Absolute: 1.1 10*3/uL — ABNORMAL HIGH (ref 0.1–1.0)
Monocytes Relative: 7.6 % (ref 3.0–12.0)
NEUTROS ABS: 8.5 10*3/uL — AB (ref 1.4–7.7)
NEUTROS PCT: 56.5 % (ref 43.0–77.0)
PLATELETS: 188 10*3/uL (ref 150.0–400.0)
RBC: 4.78 Mil/uL (ref 3.87–5.11)
RDW: 13.1 % (ref 11.5–15.5)
WBC: 14.9 10*3/uL — ABNORMAL HIGH (ref 4.0–10.5)

## 2018-02-02 LAB — BASIC METABOLIC PANEL
BUN: 7 mg/dL (ref 6–23)
CHLORIDE: 98 meq/L (ref 96–112)
CO2: 33 mEq/L — ABNORMAL HIGH (ref 19–32)
Calcium: 9.6 mg/dL (ref 8.4–10.5)
Creatinine, Ser: 0.67 mg/dL (ref 0.40–1.20)
GFR: 96.55 mL/min (ref >60.00)
Glucose, Bld: 103 mg/dL — ABNORMAL HIGH (ref 70–99)
POTASSIUM: 3.8 meq/L (ref 3.5–5.1)
Sodium: 138 mEq/L (ref 135–145)

## 2018-02-02 LAB — URINALYSIS
BILIRUBIN URINE: NEGATIVE
Hgb urine dipstick: NEGATIVE
KETONES UR: NEGATIVE
Leukocytes, UA: NEGATIVE
Nitrite: NEGATIVE
SPECIFIC GRAVITY, URINE: 1.01 (ref 1.000–1.030)
Total Protein, Urine: NEGATIVE
URINE GLUCOSE: NEGATIVE
UROBILINOGEN UA: 0.2 (ref 0.0–1.0)
pH: 8 (ref 5.0–8.0)

## 2018-02-02 LAB — HEPATIC FUNCTION PANEL
ALT: 34 U/L (ref 0–35)
AST: 51 U/L — ABNORMAL HIGH (ref 0–37)
Albumin: 3.6 g/dL (ref 3.5–5.2)
Alkaline Phosphatase: 117 U/L (ref 39–117)
BILIRUBIN TOTAL: 0.7 mg/dL (ref 0.2–1.2)
Bilirubin, Direct: 0.2 mg/dL (ref 0.0–0.3)
Total Protein: 7.5 g/dL (ref 6.0–8.3)

## 2018-02-02 LAB — HEMOGLOBIN A1C: Hgb A1c MFr Bld: 6.9 % — ABNORMAL HIGH (ref 4.6–6.5)

## 2018-02-02 LAB — TSH: TSH: 1.14 u[IU]/mL (ref 0.35–4.50)

## 2018-02-02 NOTE — Progress Notes (Signed)
Subjective:  Patient ID: Heidi Good, female    DOB: [birth year]  Age: 57 y.o. MRN: [id number]  CC: No chief complaint on file.   HPI Heidi Good [facility] presents for chronic fatigue - worse  Outpatient Medications Prior to Visit  Medication Sig Dispense Refill  . albuterol (PROVENTIL HFA;VENTOLIN HFA) 108 (90 Base) MCG/ACT inhaler Inhale 1-2 puffs into the lungs every 6 (six) hours as needed for wheezing or shortness of breath. 1 Inhaler 5  . albuterol (PROVENTIL) (2.5 MG/3ML) 0.083% nebulizer solution Take 3 mLs (2.5 mg total) by nebulization every 6 (six) hours as needed for wheezing or shortness of breath. 75 mL 5  . ALPRAZolam (XANAX) 0.5 MG tablet TK 1 T PO QD PRA  0  . Asenapine Maleate 10 MG SUBL Place 10 mg under the tongue at bedtime.    . Cholecalciferol (VITAMIN D3) 2000 units capsule Take 1 capsule (2,000 Units total) by mouth daily. 100 capsule 3  . diclofenac (VOLTAREN) 75 MG EC tablet Take 1 tablet (75 mg total) by mouth 2 (two) times daily. 30 tablet 0  . ergocalciferol (VITAMIN D2) 50000 units capsule Take 1 capsule (50,000 Units total) by mouth once a week. 6 capsule 0  . erythromycin ophthalmic ointment Place 1 application into the left eye at bedtime. L eye qhs x 5 days 3.5 g 0  . estradiol (ESTRACE) 1 MG tablet Take 1 tablet (1 mg total) by mouth daily. 90 tablet 3  . fluticasone (FLONASE) 50 MCG/ACT nasal spray Place 2 sprays into both nostrils daily. 16 g 2  . levothyroxine (SYNTHROID, LEVOTHROID) 75 MCG tablet Take 1 tablet (75 mcg total) by mouth daily. 90 tablet 3  . loratadine (CLARITIN) 10 MG tablet Take 1 tablet (10 mg total) by mouth daily. 90 tablet 3  . mometasone-formoterol (DULERA) 200-5 MCG/ACT AERO Inhale 2 puffs into the lungs 2 (two) times daily. 1 Inhaler 5  . montelukast (SINGULAIR) 10 MG tablet Take 1 tablet (10 mg total) by mouth at bedtime. 30 tablet 5  . NITRO-BID 2 % ointment Place 1 application rectally. As needed    . omeprazole (PRILOSEC) 40  MG capsule Take 1 capsule (40 mg total) by mouth daily. 90 capsule 0  . potassium chloride (KLOR-CON) 8 MEQ tablet Take 1 tablet (8 mEq total) by mouth daily. 90 tablet 3  . sulfamethoxazole-trimethoprim (BACTRIM) 400-80 MG tablet Take 1 tablet by mouth 2 (two) times daily. 10 tablet 0  . loxapine (LOXITANE) 10 MG capsule Take 20 mg by mouth at bedtime.   1   Facility-Administered Medications Prior to Visit  Medication Dose Route Frequency Provider Last Rate Last Dose  . 0.9 %  sodium chloride infusion  500 mL Intravenous Continuous Danis, Estill Cotta III, MD        ROS: Review of Systems  Constitutional: Positive for fatigue and unexpected weight change. Negative for activity change, appetite change and chills.  HENT: Negative for congestion, mouth sores and sinus pressure.   Eyes: Negative for visual disturbance.  Respiratory: Negative for cough and chest tightness.   Gastrointestinal: Negative for abdominal pain and nausea.  Genitourinary: Negative for difficulty urinating, frequency and vaginal pain.  Musculoskeletal: Negative for back pain and gait problem.  Skin: Negative for pallor and rash.  Neurological: Negative for dizziness, tremors, weakness, numbness and headaches.  Psychiatric/Behavioral: Negative for confusion, sleep disturbance and suicidal ideas.    Objective:  BP 128/82 (BP Location: Right Arm, Patient Position: Sitting, Cuff Size: Large)  Pulse 82   Temp 99.6 F (37.6 C) (Oral)   Ht 5\' 5"  (1.651 m)   Wt 210 lb (95.3 kg)   SpO2 96%   BMI 34.95 kg/m   BP Readings from Last 3 Encounters:  02/02/18 128/82  11/21/17 132/82  11/05/17 134/82    Wt Readings from Last 3 Encounters:  02/02/18 210 lb (95.3 kg)  11/21/17 202 lb (91.6 kg)  11/05/17 202 lb (91.6 kg)    Physical Exam  Constitutional: She appears well-developed. No distress.  HENT:  Head: Normocephalic.  Right Ear: External ear normal.  Left Ear: External ear normal.  Nose: Nose normal.    Mouth/Throat: Oropharynx is clear and moist.  Eyes: Pupils are equal, round, and reactive to light. Conjunctivae are normal. Right eye exhibits no discharge. Left eye exhibits no discharge.  Neck: Normal range of motion. Neck supple. No JVD present. No tracheal deviation present. No thyromegaly present.  Cardiovascular: Normal rate, regular rhythm and normal heart sounds.  Pulmonary/Chest: No stridor. No respiratory distress. She has no wheezes.  Abdominal: Soft. Bowel sounds are normal. She exhibits no distension and no mass. There is no tenderness. There is no rebound and no guarding.  Musculoskeletal: She exhibits no edema or tenderness.  Lymphadenopathy:    She has no cervical adenopathy.  Neurological: She displays normal reflexes. No cranial nerve deficit. She exhibits normal muscle tone. Coordination normal.  Skin: No rash noted. No erythema.  Psychiatric: She has a normal mood and affect. Her behavior is normal. Judgment and thought content normal.  obese Deaf Mild thyromegally  Lab Results  Component Value Date   WBC 15.6 (H) 06/11/2017   HGB 15.8 (H) 06/11/2017   HCT 47.0 (H) 06/11/2017   PLT 223.0 06/11/2017   GLUCOSE 94 11/05/2017   CHOL 137 03/10/2015   TRIG 194.0 (H) 03/10/2015   HDL 43.10 03/10/2015   LDLCALC 55 03/10/2015   ALT 31 09/04/2016   AST 47 (H) 09/04/2016   NA 138 11/05/2017   K 4.2 11/05/2017   CL 101 11/05/2017   CREATININE 0.68 11/05/2017   BUN 6 11/05/2017   CO2 30 11/05/2017   TSH 0.91 11/05/2017   HGBA1C 6.4 09/04/2016    Dg Foot Complete Left  Result Date: 08/25/2017 CLINICAL DATA:  Anterior pain and swelling over the last 7 days. EXAM: LEFT FOOT - COMPLETE 3+ VIEW COMPARISON:  06/11/2017 FINDINGS: No acute finding. Previous osteotomies of the first and fifth metatarsals with K-wire and screw. Old healed fracture of the proximal phalanx of the second toe. No evidence of recent fracture. Mild osteoarthritis of the midfoot. Small calcaneal  spurs. IMPRESSION: Chronic findings as above. No abnormality seen to explain acute pain and swelling. Electronically Signed   By: Nelson Chimes M.D.   On: 08/25/2017 09:43    Assessment & Plan:   There are no diagnoses linked to this encounter.   No orders of the defined types were placed in this encounter.    Follow-up: No follow-ups on file.  Walker Kehr, MD

## 2018-02-02 NOTE — Assessment & Plan Note (Signed)
LABS

## 2018-02-02 NOTE — Assessment & Plan Note (Signed)
Pulm referral for re-check or retesting - Dr Halford Chessman

## 2018-02-02 NOTE — Assessment & Plan Note (Signed)
Labs

## 2018-02-02 NOTE — Addendum Note (Signed)
Addended by: Karren Cobble on: 02/02/2018 04:40 PM   Modules accepted: Orders

## 2018-02-02 NOTE — Assessment & Plan Note (Signed)
Labs Pulm ref

## 2018-02-02 NOTE — Assessment & Plan Note (Signed)
Pulm ref 

## 2018-02-04 ENCOUNTER — Ambulatory Visit
Admission: RE | Admit: 2018-02-04 | Discharge: 2018-02-04 | Disposition: A | Discharge status: Home / Self Care | Payer: Medicare Other | Source: Ambulatory Visit | Attending: Internal Medicine | Admitting: Internal Medicine

## 2018-02-04 DIAGNOSIS — E049 Nontoxic goiter, unspecified: Secondary | ICD-10-CM | POA: Diagnosis not present

## 2018-02-06 ENCOUNTER — Telehealth: Payer: Self-pay | Admitting: Internal Medicine

## 2018-02-06 NOTE — Telephone Encounter (Signed)
Copied from [place] [id number]. Topic: Inquiry >> Feb 06, 2018  1:41 PM Margot Ables wrote: Reason for CRM: Pt mother, Arbie Cookey, called requesting lab results and [place] results. Please contact.

## 2018-02-06 NOTE — Telephone Encounter (Signed)
Pt's mother notified.

## 2018-02-11 ENCOUNTER — Ambulatory Visit (INDEPENDENT_AMBULATORY_CARE_PROVIDER_SITE_OTHER): Payer: Medicare Other | Admitting: Psychiatry

## 2018-02-11 DIAGNOSIS — F200 Paranoid schizophrenia: Secondary | ICD-10-CM

## 2018-02-11 DIAGNOSIS — F99 Mental disorder, not otherwise specified: Secondary | ICD-10-CM

## 2018-02-11 DIAGNOSIS — F5105 Insomnia due to other mental disorder: Secondary | ICD-10-CM | POA: Diagnosis not present

## 2018-02-11 NOTE — Progress Notes (Signed)
[facility]  Patient ID: Heidi Good,  MRN: [id number]  PCP: Cassandria Anger, MD  Date of Evaluation: 02/11/2018 Time spent:30 minutes   HISTORY/CURRENT STATUS: HPI Interpreter present for deafness.  Tried CBD cream for arthritis.  Hated Saphris bc poor sleep and restless legs.  Only took 2-3 doses.  No fearful thoughts.  Good mood.  Home alarm helps.  Denies anger problems.  Helps mother with her health problems.    Recognizes that had prior paranoid delusions before antipsychotics.Repeated episodes.   Individual Medical History/ Review of Systems: Changes? :Yes Labs per PCP pre diabetic.  Allergies: Doxycycline; Hydrocodone; and Other  Current Medications:  Current Outpatient Medications:  .  ALPRAZolam (XANAX) 0.5 MG tablet, TK 1 T PO QD PRA, Disp: , Rfl: 0 .  albuterol (PROVENTIL HFA;VENTOLIN HFA) 108 (90 Base) MCG/ACT inhaler, Inhale 1-2 puffs into the lungs every 6 (six) hours as needed for wheezing or shortness of breath., Disp: 1 Inhaler, Rfl: 5 .  albuterol (PROVENTIL) (2.5 MG/3ML) 0.083% nebulizer solution, Take 3 mLs (2.5 mg total) by nebulization every 6 (six) hours as needed for wheezing or shortness of breath., Disp: 75 mL, Rfl: 5 .  Cholecalciferol (VITAMIN D3) 2000 units capsule, Take 1 capsule (2,000 Units total) by mouth daily., Disp: 100 capsule, Rfl: 3 .  diclofenac (VOLTAREN) 75 MG EC tablet, Take 1 tablet (75 mg total) by mouth 2 (two) times daily., Disp: 30 tablet, Rfl: 0 .  ergocalciferol (VITAMIN D2) 50000 units capsule, Take 1 capsule (50,000 Units total) by mouth once a week., Disp: 6 capsule, Rfl: 0 .  erythromycin ophthalmic ointment, Place 1 application into the left eye at bedtime. L eye qhs x 5 days, Disp: 3.5 g, Rfl: 0 .  estradiol (ESTRACE) 1 MG tablet, Take 1 tablet (1 mg total) by mouth daily., Disp: 90 tablet, Rfl: 3 .  fluticasone (FLONASE) 50 MCG/ACT nasal spray, Place 2 sprays into both nostrils daily., Disp: 16 g, Rfl:  2 .  levothyroxine (SYNTHROID, LEVOTHROID) 75 MCG tablet, Take 1 tablet (75 mcg total) by mouth daily., Disp: 90 tablet, Rfl: 3 .  loratadine (CLARITIN) 10 MG tablet, Take 1 tablet (10 mg total) by mouth daily., Disp: 90 tablet, Rfl: 3 .  mometasone-formoterol (DULERA) 200-5 MCG/ACT AERO, Inhale 2 puffs into the lungs 2 (two) times daily., Disp: 1 Inhaler, Rfl: 5 .  montelukast (SINGULAIR) 10 MG tablet, Take 1 tablet (10 mg total) by mouth at bedtime., Disp: 30 tablet, Rfl: 5 .  NITRO-BID 2 % ointment, Place 1 application rectally. As needed, Disp: , Rfl:  .  omeprazole (PRILOSEC) 40 MG capsule, Take 1 capsule (40 mg total) by mouth daily., Disp: 90 capsule, Rfl: 0 .  potassium chloride (KLOR-CON) 8 MEQ tablet, Take 1 tablet (8 mEq total) by mouth daily., Disp: 90 tablet, Rfl: 3 .  sulfamethoxazole-trimethoprim (BACTRIM) 400-80 MG tablet, Take 1 tablet by mouth 2 (two) times daily., Disp: 10 tablet, Rfl: 0  Current Facility-Administered Medications:  .  0.9 %  sodium chloride infusion, 500 mL, Intravenous, Continuous, Danis, Kirke Corin, MD Medication Side Effects: Sleep Problems  Family Medical/ Social History: Changes? Yes Still lives with mother.  Denies conflict usually.  Has BF.  MENTAL HEALTH EXAM:  There were no vitals taken for this visit.There is no height or weight on file to calculate BMI.  General Appearance: Casual  Eye Contact:  Good  Speech:  deaf cannot speak  Volume:  no speech  Mood:  Euthymic  Affect:  Full Range  Thought Process:  Irrelevant  Orientation:  Full (Time, Place, and Person)  Thought Content: risk of paranoia   Suicidal Thoughts:  No  Homicidal Thoughts:  No  Memory:  Recent  Judgement:  Impaired  Insight:  Lacking  Psychomotor Activity:  Normal  Concentration:  Concentration: Fair  Recall:  Good  Fund of Knowledge: Good  Language: signs  Akathisia:  No  AIMS (if indicated): not done  Assets:  Housing Social Support  ADL's:  Intact   Cognition: WNL  Prognosis:  Fair    DIAGNOSES:    ICD-10-CM   1. Paranoid schizophrenia ([place]) F20.0   2. Insomnia due to other mental disorder F51.05    F99     RECOMMENDATIONS: Discussed risk off psych meds.  Discussed in detailthe history of repeated psychosis off meds and rec strongly try another antipsychotic.  Various options considered for low EPS risk.  Fanapt next best option, lowest dose 1mg  q HS.  Samples given.  Mother to call Monday with results.  Agrees.    Purnell Shoemaker, MD

## 2018-02-11 NOTE — Patient Instructions (Signed)
Take 1 mg tablet of Fanapt each night.  Call Monday with report of how well it is tolerated or if there are any problems.  We will then call in a prescription if it is OK.

## 2018-02-16 ENCOUNTER — Ambulatory Visit: Payer: Self-pay

## 2018-02-17 ENCOUNTER — Other Ambulatory Visit: Payer: Self-pay

## 2018-02-17 ENCOUNTER — Telehealth: Payer: Self-pay | Admitting: Psychiatry

## 2018-02-17 MED ORDER — ASENAPINE MALEATE 10 MG SL SUBL: 10.0000 mg | SUBLINGUAL_TABLET | Freq: Every day | SUBLINGUAL | 0 refills | Status: DC

## 2018-02-17 NOTE — Telephone Encounter (Signed)
Patient mom called. Stated she need to speak with Doctor .Medication is too expensive.

## 2018-02-17 NOTE — Telephone Encounter (Signed)
Chart in your box its on saphris

## 2018-02-18 NOTE — Telephone Encounter (Addendum)
Pt mom has called and wants to know if another medicine can be called in. Sapphris is too expensive. Pt says she would like to call in abilify 10 mg to the [facility] on [address]. The pt is willing to take this until she sees you.

## 2018-02-20 NOTE — Telephone Encounter (Signed)
Talked with pt.s mom.  Heidi Good says saphris makes her mouth very dry and doesn't like it makes her feel.  She doesn't want to come get the samples.  She wants something different. She got nervous about not having anything to take and starting throwing up and got shaky.  She had a refill on abilify and so it presently taking that.  It helps keep her calm what is not ideal.  Please rx something else.  [facility] [address]

## 2018-02-24 ENCOUNTER — Encounter: Payer: Self-pay | Admitting: Gastroenterology

## 2018-02-24 ENCOUNTER — Ambulatory Visit (INDEPENDENT_AMBULATORY_CARE_PROVIDER_SITE_OTHER): Payer: Medicare Other | Admitting: Gastroenterology

## 2018-02-24 VITALS — BP 124/82 | HR 72 | Ht 65.0 in | Wt 204.0 lb

## 2018-02-24 DIAGNOSIS — K5989 Other specified functional intestinal disorders: Secondary | ICD-10-CM

## 2018-02-24 DIAGNOSIS — K598 Other specified functional intestinal disorders: Secondary | ICD-10-CM

## 2018-02-24 DIAGNOSIS — K5909 Other constipation: Secondary | ICD-10-CM

## 2018-02-24 MED ORDER — PLECANATIDE 3 MG PO TABS: 3.0000 mg | ORAL_TABLET | Freq: Every day | ORAL | 0 refills | Status: DC

## 2018-02-24 NOTE — Progress Notes (Signed)
[facility] Progress Note  Chief Complaint: Chronic constipation  Subjective  History:  Joua follows up for lower abdominal pain and chronic constipation.  She was seen along with a sign language interpreter, who was present for the entire visit.  From my office note of 02/03/2017: " FBP:ZWCH continues to be a very challenging scenario of multiple GI symptoms with a previous history of gastroparesis as well as what appears to be functional bowel disorder. Colonoscopy was unremarkable, no hemorrhoids were seen and certainly no obstruction. As detailed in most recent office note, I wonder if she has some pelvic dyssynergia or other anorectal motility disorder. She has failed Linzess and has insufficient improvement on MiraLAX. The bleeding appears to be benign anorectal bleeding from constipation" Anorectal manometry was then performed, which revealed pelvic dyssynergia.  A referral placed for pelvic floor PT. Ameria did not recall learning of the findings from the anorectal motility, nor going to a PT appointment.  There are no notes on file from that clinic.  When my office staff scheduled a referral today, we learned that she had been one time but did not follow-up.  She may have canceled due to a conflicting appointment and then did not reschedule.  She is much the same as before, with crampy lower abdominal pain feelings of bloating chronic constipation.  She may sit for prolonged periods feeling as if she has to pass a bowel movement, but is unable to do so.  She took a few doses of her mother's Linzess, and says that after a day or so it caused some loose stool.  She was taking that medicine here and there rather than regularly.  I brought up that she had previously taken that with Dr. Deatra Ina and had not found it very effective, but she did not recall that.  Cardiovascular:  no chest pain Respiratory: no dyspnea She has had depression and anxiety and is on a new medicine  (Abilify) Remainder of systems negative except as above  The patient's Past Medical, Family and Social History were reviewed and are on file in the EMR.  Objective:  Med list reviewed  Current Outpatient Medications:  .  albuterol (PROVENTIL HFA;VENTOLIN HFA) 108 (90 Base) MCG/ACT inhaler, Inhale 1-2 puffs into the lungs every 6 (six) hours as needed for wheezing or shortness of breath., Disp: 1 Inhaler, Rfl: 5 .  albuterol (PROVENTIL) (2.5 MG/3ML) 0.083% nebulizer solution, Take 3 mLs (2.5 mg total) by nebulization every 6 (six) hours as needed for wheezing or shortness of breath., Disp: 75 mL, Rfl: 5 .  ALPRAZolam (XANAX) 0.5 MG tablet, TK 1 T PO QD PRA, Disp: , Rfl: 0 .  ARIPiprazole (ABILIFY) 15 MG tablet, Take 1 tablet by mouth daily., Disp: , Rfl: 0 .  Cholecalciferol (VITAMIN D3) 2000 units capsule, Take 1 capsule (2,000 Units total) by mouth daily., Disp: 100 capsule, Rfl: 3 .  diclofenac (VOLTAREN) 75 MG EC tablet, Take 1 tablet (75 mg total) by mouth 2 (two) times daily., Disp: 30 tablet, Rfl: 0 .  ergocalciferol (VITAMIN D2) 50000 units capsule, Take 1 capsule (50,000 Units total) by mouth once a week., Disp: 6 capsule, Rfl: 0 .  erythromycin ophthalmic ointment, Place 1 application into the left eye at bedtime. L eye qhs x 5 days, Disp: 3.5 g, Rfl: 0 .  estradiol (ESTRACE) 1 MG tablet, Take 1 tablet (1 mg total) by mouth daily., Disp: 90 tablet, Rfl: 3 .  fluticasone (FLONASE) 50 MCG/ACT nasal spray,  Place 2 sprays into both nostrils daily., Disp: 16 g, Rfl: 2 .  levothyroxine (SYNTHROID, LEVOTHROID) 75 MCG tablet, Take 1 tablet (75 mcg total) by mouth daily., Disp: 90 tablet, Rfl: 3 .  loratadine (CLARITIN) 10 MG tablet, Take 1 tablet (10 mg total) by mouth daily., Disp: 90 tablet, Rfl: 3 .  mometasone-formoterol (DULERA) 200-5 MCG/ACT AERO, Inhale 2 puffs into the lungs 2 (two) times daily., Disp: 1 Inhaler, Rfl: 5 .  NITRO-BID 2 % ointment, Place 1 application rectally. As  needed, Disp: , Rfl:  .  potassium chloride (KLOR-CON) 8 MEQ tablet, Take 1 tablet (8 mEq total) by mouth daily., Disp: 90 tablet, Rfl: 3 .  Plecanatide (TRULANCE) 3 MG TABS, Take 3 mg by mouth daily., Disp: 21 tablet, Rfl: 0  Current Facility-Administered Medications:  .  0.9 %  sodium chloride infusion, 500 mL, Intravenous, Continuous, Danis, Estill Cotta III, MD   Vital signs in last 24 hrs: Vitals:   02/24/18 0937  BP: 124/82  Pulse: 72    Physical Exam  Well-appearing  HEENT: sclera anicteric, oral mucosa moist without lesions  Neck: supple, no thyromegaly, JVD or lymphadenopathy  Cardiac: RRR without murmurs, S1S2 heard, no peripheral edema  Pulm: clear to auscultation bilaterally, normal RR and effort noted  Abdomen: soft, no tenderness, with active bowel sounds. No guarding or palpable hepatosplenomegaly.  Skin; warm and dry, no jaundice or rash  Recent Labs:  Anorectal motility study as noted above  @ASSESSMENTPLANBEGIN @ Assessment: Encounter Diagnoses  Name Primary?  . Chronic constipation Yes  . Anorectal motility disorder    She thought she may have gotten better improvement from [place] this time than she did in the past, but wanted to see if other medicines were available.  I think the only medicine might be helpful is Trulance, though it may cause similar side effects of cramping and loose stool as she had experienced with Linzess.  For the last, she would like to try it, so samples of 3 mg tablets to take once daily were given the last 10 to 14 days.  I would then like to hear from her, and if she finds it effective, prescription can be sent.  A new referral was placed she can reestablish care with the specialty PT clinic for pelvic floor dysfunction/anorectal dysmotility.  Total time 25 minutes, over half spent face-to-face with patient in counseling and coordination of care.  Extra time required due to hearing impairment and need for sign  interpreter.  Nelida Meuse III

## 2018-02-24 NOTE — Patient Instructions (Signed)
If you are age 56 or older, your body mass index should be between 23-30. Your Body mass index is 33.95 kg/m. If this is out of the aforementioned range listed, please consider follow up with your Primary Care Provider.  If you are age 65 or younger, your body mass index should be between 19-25. Your Body mass index is 33.95 kg/m. If this is out of the aformentioned range listed, please consider follow up with your Primary Care Provider.   We have given you samples of the following medication to take: Trulance 3 mg  Lot [id number] Exp 05-2020 #21  We will send a referral to the pelvic floor dysfunction clinic.  Please call if you don't hear from them in 2 weeks.  [phone]  It was a pleasure to see you today!  Dr. Loletha Carrow

## 2018-03-03 ENCOUNTER — Other Ambulatory Visit: Payer: Self-pay | Admitting: Internal Medicine

## 2018-03-03 DIAGNOSIS — Z1231 Encounter for screening mammogram for malignant neoplasm of breast: Secondary | ICD-10-CM

## 2018-03-04 ENCOUNTER — Encounter: Payer: Self-pay | Admitting: Emergency Medicine

## 2018-03-09 ENCOUNTER — Other Ambulatory Visit: Payer: Self-pay

## 2018-03-09 ENCOUNTER — Ambulatory Visit: Payer: Medicare Other | Attending: Gastroenterology | Admitting: Physical Therapy

## 2018-03-09 DIAGNOSIS — M6281 Muscle weakness (generalized): Secondary | ICD-10-CM

## 2018-03-09 DIAGNOSIS — R278 Other lack of coordination: Secondary | ICD-10-CM | POA: Diagnosis not present

## 2018-03-09 DIAGNOSIS — M62838 Other muscle spasm: Secondary | ICD-10-CM

## 2018-03-09 NOTE — Therapy (Addendum)
[facility] [address] [place], [place], [address] Phone: [phone]   Fax:  [phone]  Physical Therapy Evaluation  Patient Details  Name: Heidi Good MRN: [id number] Date of Birth: 1961/05/07 Referring Provider (PT): Doran Stabler, MD   Encounter Date: 03/09/2018  PT End of Session - 03/09/18 1203    Visit Number  1    Date for PT Re-Evaluation  05/04/18    Authorization Type  medicare    PT Start Time  1149    PT Stop Time  1226    PT Time Calculation (min)  37 min    Activity Tolerance  Patient tolerated treatment well    Behavior During Therapy  [facility] for tasks assessed/performed       Past Medical History:  Diagnosis Date  . Anxiety   . Constipation   . Deafness   . Depression   . Gastroparesis   . Hyperlipemia   . Hypothyroidism   . Type II or unspecified type diabetes mellitus without mention of complication, not stated as uncontrolled   . Unspecified constipation 07/17/2013    Past Surgical History:  Procedure Laterality Date  . ABDOMINAL HYSTERECTOMY    . ANAL RECTAL MANOMETRY N/A 03/07/2017   Procedure: ANO RECTAL MANOMETRY;  Surgeon: Mauri Pole, MD;  Location: [facility];  Service: Endoscopy;  Laterality: N/A;    There were no vitals filed for this visit.   Subjective Assessment - 03/09/18 1152    Subjective  Pt has dryness and constipation.  I am having a BM maybe 3x/week and it is not a lot.  Cramps sometimes in abdomen and BM are thin like a pencil.  Pt used to go every day.  She report she has a lot of gas    Patient is accompained by:  Interpreter    Patient Stated Goals  less pain and bloating, able to have BM    Currently in Pain?  No/denies         [facility] PT Assessment - 03/13/18 0001      Assessment   Medical Diagnosis  K59.09 (ICD-10-CM) - Chronic constipation; K59.8 (ICD-10-CM) - Anorectal motility disorder    Referring Provider (PT)  Danis, Kirke Corin, MD    Prior Therapy  Yes for same issue but unable to f/u      Precautions   Precautions  None      Restrictions   Weight Bearing Restrictions  No      Balance Screen   Has the patient fallen in the past 6 months  No      [place] residence    Living Arrangements  Parent      Prior Function   Level of Independence  Independent      Cognition   Overall Cognitive Status  Within Functional Limits for tasks assessed      AROM   Lumbar Flexion  only flexes to neutral, gets ROM from hamstring length    Lumbar Extension  [facility]    Lumbar - Right Side Bend  WFL    Lumbar - Left Side Bend  WFL      PROM   Overall PROM Comments  hip ER 50% limited bilaterally      Strength   Overall Strength Comments  Lt ER 4/5      Flexibility   Soft Tissue Assessment /Muscle Length  yes    Hamstrings  WFL      Palpation   Palpation comment  tender and tight hip flexors bilaterally, tender throughout abdomen      Ambulation/Gait   Gait Pattern  Within Functional Limits                Objective measurements completed on examination: See above findings.    Pelvic Floor Special Questions - 03/13/18 0001    Prior Pelvic/Prostate Exam  Yes    Date of Last Pelvic/Prostate Exam  --   10 years ago   Prior Pregnancies  No    Currently Sexually Active  Yes    Urinary Leakage  No    Fluid intake  64 oz of water    Skin Integrity  Hemorroids;Intact    Pelvic Floor Internal Exam  identity confirmed and patient informed and gave consent for internal soft tissue assessment    Exam Type  Rectal    Palpation  tight anal sphincters, puborectalis and pubococcygeus    Strength  fair squeeze, definite lift    Strength # of reps  2    Strength # of seconds  5    Tone  high          [facility] Adult PT Treatment/Exercise - 03/09/18 0001      Self-Care   Self-Care  Other Self-Care Comments    Other Self-Care Comments   toilet techniques and abdominal  massage               PT Short Term Goals - 03/09/18 1313      PT SHORT TERM GOAL #1   Title  pt will be ind with toileting techniques    Time  4    Period  Weeks    Status  New    Target Date  04/06/18      PT SHORT TERM GOAL #2   Title  Pt will report skin around the anus is not chapped due to ability to moisturize correctly    Time  4    Period  Weeks    Status  New    Target Date  04/06/18        PT Long Term Goals - 03/09/18 1311      PT LONG TERM GOAL #1   Title  Pt reports feeling like she is able to completely empty bowels and have 1 BM/day    Time  8    Period  Weeks    Status  New    Target Date  05/04/18      PT LONG TERM GOAL #2   Title  pt will have quarter size diameter of bowel due to improved muscle relaxation    Time  8    Period  Weeks    Status  New    Target Date  05/04/18      PT LONG TERM GOAL #3   Title  pt will be ind with advanced HEP    Time  8    Period  Weeks    Status  New    Target Date  05/04/18      PT LONG TERM GOAL #4   Title  pt will report 50% less abdominal cramping and pain due to improved muscle coordination    Time  8    Period  Weeks    Status  New    Target Date  05/04/18             Plan - 03/09/18  1257    Clinical Impression Statement  Patient presents to clinic due to dyssynergic constipation.  She has had difficulty and needing manual assistance to have BM for the last year.  Pt came to PT previously but was unable to follow up due to her schedule.  She reports stool consistency is stringing and pencil thin.  Pt has weakkness of abdominals and bilat hip flexors weak, tight, tender to palpation.  Pt has pain and tender to palpation of puborectalis and obdurator internus and levator ani muscles.  Pt is unable to evacuate finger from rectum during manual assessment due to lack of muscle coordination.  she uses valsalva maneuver when pushing. Pt has high tone pelvic floor and little movement when  contracting mucles, able to hold for 5 seconds at max contraction.  Pt will benefit from skilled PT to address impairments and improve ability to have greater self.     History and Personal Factors relevant to plan of care:  laproscopic hysterectomy    Clinical Presentation  Evolving    Clinical Decision Making  Moderate    Rehab Potential  Good    PT Frequency  1x / week    PT Duration  8 weeks    PT Treatment/Interventions  ADLs/Self Care Home Management;Biofeedback;Electrical Stimulation;Cryotherapy;Moist Heat;Therapeutic activities;Therapeutic exercise;Neuromuscular re-education;Patient/family education;Manual techniques;Passive range of motion;Dry needling;Taping    PT Next Visit Plan  start on ball, biofeedback relax and bulge    Recommended Other Services  eval 03/09/18    Consulted and Agree with Plan of Care  Patient       Patient will benefit from skilled therapeutic intervention in order to improve the following deficits and impairments:  Abnormal gait, Pain, Increased fascial restricitons, Increased muscle spasms, Decreased strength, Decreased range of motion, Impaired tone, Postural dysfunction  Visit Diagnosis: Other muscle spasm  Other lack of coordination  Muscle weakness (generalized)     Problem List Patient Active Problem List   Diagnosis Date Noted  . Stye external 11/21/2017  . Allergic rhinitis 11/05/2017  . Restless legs syndrome (RLS) 08/05/2017  . Insomnia 08/05/2017  . Paresthesia 04/07/2017  . Foot pain, left 04/07/2017  . Plantar fasciitis 02/26/2017  . Chest pain, atypical 12/04/2016  . Snoring 09/25/2016  . Arthralgia 09/04/2016  . GERD (gastroesophageal reflux disease) 04/03/2016  . Fatigue 08/02/2015  . Acute sinusitis 08/02/2015  . Hip pain, bilateral 06/01/2015  . Prediabetes 03/08/2015  . Hyperlipidemia 03/08/2015  . Constipation 07/17/2013  . Seasonal and perennial allergic rhinitis 08/28/2011  . Asthma, mild intermittent 08/28/2011   . Obstructive sleep apnea 08/28/2011  . Gastroparesis 04/25/2010  . IBS 08/16/2008  . Hypothyroidism 07/06/2008  . Paranoid schizophrenia, chronic condition ([place]) 07/06/2008  . Major depressive disorder, single episode, moderate ([place]) 07/06/2008  . Hearing loss 07/06/2008    Zannie Cove, PT 03/09/2018, 1:19 PM  [facility] [address], [place], [place], [address] Phone: [phone]   Fax:  [phone]  Name: ZAINEB NOWACZYK MRN: [id number] Date of Birth: 17-Sep-1960

## 2018-03-09 NOTE — Patient Instructions (Signed)
About Abdominal Massage  Abdominal massage, also called external colon massage, is a self-treatment circular massage technique that can reduce and eliminate gas and ease constipation. The colon naturally contracts in waves in a clockwise direction starting from inside the right hip, moving up toward the ribs, across the belly, and down inside the left hip.  When you perform circular abdominal massage, you help stimulate your colon's normal wave pattern of movement called peristalsis.  It is most beneficial when done after eating.  Positioning You can practice abdominal massage with oil while lying down, or in the shower with soap.  Some people find that it is just as effective to do the massage through clothing while sitting or standing.  How to Massage Start by placing your finger tips or knuckles on your right side, just inside your hip bone.  . Make small circular movements while you move upward toward your rib cage.   . Once you reach the bottom right side of your rib cage, take your circular movements across to the left side of the bottom of your rib cage.  . Next, move downward until you reach the inside of your left hip bone.  This is the path your feces travel in your colon. . Continue to perform your abdominal massage in this pattern for 10 minutes each day.     You can apply as much pressure as is comfortable in your massage.  Start gently and build pressure as you continue to practice.  Notice any areas of pain as you massage; areas of slight pain may be relieved as you massage, but if you have areas of significant or intense pain, consult with your healthcare provider.  Other Considerations . General physical activity including bending and stretching can have a beneficial massage-like effect on the colon.  Deep breathing can also stimulate the colon because breathing deeply activates the same nervous system that supplies the colon.   . Abdominal massage should always be used in  combination with a bowel-conscious diet that is high in the proper type of fiber for you, fluids (primarily water), and a regular exercise program.  Toileting Techniques for Bowel Movements (Defecation) Using your belly (abdomen) and pelvic floor muscles to have a bowel movement is usually instinctive.  Sometimes people can have problems with these muscles and have to relearn proper defecation (emptying) techniques.  If you have weakness in your muscles, organs that are falling out, decreased sensation in your pelvis, or ignore your urge to go, you may find yourself straining to have a bowel movement.  You are straining if you are: . holding your breath or taking in a huge gulp of air and holding it  . keeping your lips and jaw tensed and closed tightly . turning red in the face because of excessive pushing or forcing . developing or worsening your  hemorrhoids . getting faint while pushing . not emptying completely and have to defecate many times a day  If you are straining, you are actually making it harder for yourself to have a bowel movement.  Many people find they are pulling up with the pelvic floor muscles and closing off instead of opening the anus. Due to lack pelvic floor relaxation and coordination the abdominal muscles, one has to work harder to push the feces out.  Many people have never been taught how to defecate efficiently and effectively.  Notice what happens to your body when you are having a bowel movement.  While you are sitting on  the toilet pay attention to the following areas: . Jaw and mouth position . Angle of your hips   . Whether your feet touch the ground or not . Arm placement  . Spine position . Waist . Belly tension . Anus (opening of the anal canal)  An Evacuation/Defecation Plan   Here are the 4 basic points:  1. Lean forward enough for your elbows to rest on your knees 2. Support your feet on the floor or use a low stool if your feet don't touch the  floor  3. Push out your belly as if you have swallowed a beach ball-you should feel a widening of your waist 4. Open and relax your pelvic floor muscles, rather than tightening around the anus      The following conditions my require modifications to your toileting posture:  . If you have had surgery in the past that limits your back, hip, pelvic, knee or ankle flexibility . Constipation   Your healthcare practitioner may make the following additional suggestions and adjustments:  1) Sit on the toilet  a) Make sure your feet are supported. b) Notice your hip angle and spine position-most people find it effective to lean forward or raise their knees, which can help the muscles around the anus to relax  c) When you lean forward, place your forearms on your thighs for support  2) Relax suggestions a) Breath deeply in through your nose and out slowly through your mouth as if you are smelling the flowers and blowing out the candles. b) To become aware of how to relax your muscles, contracting and releasing muscles can be helpful.  Pull your pelvic floor muscles in tightly by using the image of holding back gas, or closing around the anus (visualize making a circle smaller) and lifting the anus up and in.  Then release the muscles and your anus should drop down and feel open. Repeat 5 times ending with the feeling of relaxation. c) Keep your pelvic floor muscles relaxed; let your belly bulge out. d) The digestive tract starts at the mouth and ends at the anal opening, so be sure to relax both ends of the tube.  Place your tongue on the roof of your mouth with your teeth separated.  This helps relax your mouth and will help to relax the anus at the same time.  3) Empty (defecation) a) Keep your pelvic floor and sphincter relaxed, then bulge your anal muscles.  Make the anal opening wide.  b) Stick your belly out as if you have swallowed a beach ball. c) Make your belly wall hard using your belly  muscles while continuing to breathe. Doing this makes it easier to open your anus. d) Breath out and give a grunt (or try using other sounds such as ahhhh, shhhhh, ohhhh or grrrrrrr).  4) Finish a) As you finish your bowel movement, pull the pelvic floor muscles up and in.  This will leave your anus in the proper place rather than remaining pushed out and down. If you leave your anus pushed out and down, it will start to feel as though that is normal and give you incorrect signals about needing to have a bowel movement.    [facility] [address] [place], [place] [address]  

## 2018-03-11 ENCOUNTER — Ambulatory Visit: Payer: Self-pay | Admitting: Internal Medicine

## 2018-03-11 ENCOUNTER — Encounter: Payer: Self-pay | Admitting: Pulmonary Disease

## 2018-03-11 ENCOUNTER — Other Ambulatory Visit (INDEPENDENT_AMBULATORY_CARE_PROVIDER_SITE_OTHER): Payer: Medicare Other

## 2018-03-11 ENCOUNTER — Ambulatory Visit (INDEPENDENT_AMBULATORY_CARE_PROVIDER_SITE_OTHER): Payer: Medicare Other | Admitting: Pulmonary Disease

## 2018-03-11 VITALS — BP 126/78 | HR 68 | Ht 65.0 in | Wt 203.2 lb

## 2018-03-11 DIAGNOSIS — R002 Palpitations: Secondary | ICD-10-CM | POA: Diagnosis not present

## 2018-03-11 DIAGNOSIS — J4540 Moderate persistent asthma, uncomplicated: Secondary | ICD-10-CM

## 2018-03-11 DIAGNOSIS — R0683 Snoring: Secondary | ICD-10-CM

## 2018-03-11 DIAGNOSIS — R05 Cough: Secondary | ICD-10-CM | POA: Diagnosis not present

## 2018-03-11 DIAGNOSIS — R059 Cough, unspecified: Secondary | ICD-10-CM

## 2018-03-11 LAB — BASIC METABOLIC PANEL
BUN: 6 mg/dL (ref 6–23)
CALCIUM: 9.3 mg/dL (ref 8.4–10.5)
CO2: 29 mEq/L (ref 19–32)
Chloride: 99 mEq/L (ref 96–112)
Creatinine, Ser: 0.7 mg/dL (ref 0.40–1.20)
GFR: 91.76 mL/min (ref >60.00)
Glucose, Bld: 93 mg/dL (ref 70–99)
Potassium: 3.6 mEq/L (ref 3.5–5.1)
Sodium: 136 mEq/L (ref 135–145)

## 2018-03-11 LAB — MAGNESIUM: Magnesium: 1.5 mg/dL (ref 1.5–2.5)

## 2018-03-11 MED ORDER — FLUTICASONE PROPIONATE HFA 110 MCG/ACT IN AERO: 2.0000 | INHALATION_SPRAY | Freq: Two times a day (BID) | RESPIRATORY_TRACT | 12 refills | Status: DC

## 2018-03-11 NOTE — Patient Instructions (Signed)
Stop dulera Flovent two puffs in the morning and two puffs at night, and rinse mouth after each use Will get you a spacer device to use with flovent and albuterol Lab tests today Will arrange for referral to cardiology  Follow up in 8 weeks

## 2018-03-11 NOTE — Progress Notes (Signed)
[facility]  Chief Complaint  Patient presents with  . Sleep Consult    Per patient, increased dry mouth and coughing. Patient has a history of OSA. Used a CPAP 8 years ago but it was taken from her after 4 months.     Constitutional:  BP 126/78 (BP Location: Left Arm, Patient Position: Sitting, Cuff Size: Normal)   Pulse 68   Ht '5\' 5"'$  (1.651 m)   Wt 203 lb 3.2 oz (92.2 kg)   SpO2 98%   BMI 33.81 kg/m   Past Medical History:  Anxiety, Deaf, Depression, Gastroparesis, HLD, Hypothyroidism, DM, Constipation  Brief Summary:  Heidi Good is a 57 y.o. female with allergic asthma and sleep difficulties.  She was tx for asthma exacerbation few weeks ago.  Still has dry cough and throat irritation.  She has been using dulera and albuterol.  Doesn't think her sinuses are an issue.  Not having too much trouble with reflux at present.  She had sleep study in May 2018 that was negative for sleep apnea.  She snores some according to her mother.  She otherwise doesn't feel like she has any trouble with her sleep, and feels rested during the day.  She doesn't think she needs any further sleep testing at this time.  She does feel like her heart skips at times.  She is not having chest pain, shortness of breath, or leg swelling.  Interview conducted with assistance of sign language interpretor.  Physical Exam:   Appearance - well kempt   ENMT - clear nasal mucosa, midline nasal  septum, no oral exudates, no LAN, trachea midline  Respiratory - normal chest wall, normal respiratory effort, no accessory muscle use, no wheeze/rales  CV - irregular heart rhythm, no murmurs, no peripheral edema, radial pulses symmetric  GI - soft, non tender, no masses  Lymph - no adenopathy noted in neck and axillary areas  MSK - normal gait  Ext - no cyanosis, clubbing, or joint inflammation noted  Skin - no rashes, lesions, or ulcers  Neuro - normal  strength, oriented x 3  Psych - normal mood and affect  ECG 03/11/18 >> sinus rhythm with frequent PVCs  Assessment/Plan:   Upper airway cough from allergic rhinitis. - continue flonase, claritin  Allergic asthma. - change to flovent 100 mcg two puffs bid - use spacer with inhalers - prn albuterol  Palpitations. - she has frequent PVCs on ECG - check BMET and Mg - d/c LABA from inhaler regimen - she has family hx of cardiac disease - will refer to cardiology for further assessment  Snoring. - she had sleep study in May 2018 that was negative for sleep apnea - discussed importance of weight loss - she can try positional therapy - don't think she needs additional sleep testing at this time   Patient Instructions  Stop dulera Flovent two puffs in the morning and two puffs at night, and rinse mouth after each use Will get you a spacer device to use with flovent and albuterol Lab tests today Will arrange for referral to cardiology  Follow up in 8 weeks   [name], MD [facility] Pager: [phone] 03/11/2018, 3:34 PM  Flow Sheet     Pulmonary tests:  FeNO 06/25/16 >> 21 RAST 07/05/16 >> multiple allergies, IgE 200 PFT 09/10/16 >> FEV1 2.15 (77%), FEV1% 84%, TLC 4.72 (90%), DLCO 86%, no BD  Sleep tests:  PSG 09/24/16 >> AHI 0, SpO2  low 90%, minimal sleep time   Medications:   Allergies as of 03/11/2018      Reactions   Doxycycline Nausea And Vomiting   Hydrocodone    Nausea from cough syrup   Other    Dog, cat, nuts, grass, trees      Medication List        Accurate as of 03/11/18  3:34 PM. Always use your most recent med list.          albuterol 108 (90 Base) MCG/ACT inhaler Commonly known as:  PROVENTIL HFA;VENTOLIN HFA Inhale 1-2 puffs into the lungs every 6 (six) hours as needed for wheezing or shortness of breath.   albuterol (2.5 MG/3ML) 0.083% nebulizer solution Commonly known as:  PROVENTIL Take 3 mLs (2.5 mg  total) by nebulization every 6 (six) hours as needed for wheezing or shortness of breath.   ALPRAZolam 0.5 MG tablet Commonly known as:  XANAX TK 1 T PO QD PRA   ARIPiprazole 15 MG tablet Commonly known as:  ABILIFY Take 1 tablet by mouth daily.   diclofenac 75 MG EC tablet Commonly known as:  VOLTAREN Take 1 tablet (75 mg total) by mouth 2 (two) times daily.   ergocalciferol 50000 units capsule Commonly known as:  VITAMIN D2 Take 1 capsule (50,000 Units total) by mouth once a week.   erythromycin ophthalmic ointment Place 1 application into the left eye at bedtime. L eye qhs x 5 days   estradiol 1 MG tablet Commonly known as:  ESTRACE Take 1 tablet (1 mg total) by mouth daily.   fluticasone 110 MCG/ACT inhaler Commonly known as:  FLOVENT HFA Inhale 2 puffs into the lungs 2 (two) times daily.   fluticasone 50 MCG/ACT nasal spray Commonly known as:  FLONASE Place 2 sprays into both nostrils daily.   levothyroxine 75 MCG tablet Commonly known as:  SYNTHROID, LEVOTHROID Take 1 tablet (75 mcg total) by mouth daily.   loratadine 10 MG tablet Commonly known as:  CLARITIN Take 1 tablet (10 mg total) by mouth daily.   NITRO-BID 2 % ointment Generic drug:  nitroGLYCERIN Place 1 application rectally. As needed   perphenazine 8 MG tablet Commonly known as:  TRILAFON Take 8 mg by mouth at bedtime.   Plecanatide 3 MG Tabs Take 3 mg by mouth daily.   potassium chloride 8 MEQ tablet Commonly known as:  KLOR-CON Take 1 tablet (8 mEq total) by mouth daily.   SAPHRIS 10 MG Subl Generic drug:  Asenapine Maleate Place 10 mg under the tongue at bedtime.   Vitamin D3 2000 units capsule Take 1 capsule (2,000 Units total) by mouth daily.       Past Surgical History:  She  has a past surgical history that includes Abdominal hysterectomy and Anal Rectal manometry (N/A, 03/07/2017).  Family History:  Her family history includes Colon polyps in her mother; Diabetes in her  mother; Emphysema in her father and paternal grandfather; Heart attack in her father and maternal grandfather; Heart disease in her father; Liver cancer in her maternal grandmother; Multiple myeloma in her father.  Social History:  She  reports that she has never smoked. She has never used smokeless tobacco. She reports that she does not drink alcohol or use drugs.

## 2018-03-12 ENCOUNTER — Telehealth: Payer: Self-pay | Admitting: Pulmonary Disease

## 2018-03-12 DIAGNOSIS — J4550 Severe persistent asthma, uncomplicated: Secondary | ICD-10-CM

## 2018-03-12 NOTE — Telephone Encounter (Signed)
BMP Latest Ref Rng & Units 03/11/2018 02/02/2018 11/05/2017  Glucose 70 - 99 mg/dL 93 103(H) 94  BUN 6 - 23 mg/dL 6 7 6   Creatinine 0.40 - 1.20 mg/dL 0.70 0.67 0.68  Sodium 135 - 145 mEq/L 136 138 138  Potassium 3.5 - 5.1 mEq/L 3.6 3.8 4.2  Chloride 96 - 112 mEq/L 99 98 101  CO2 19 - 32 mEq/L 29 33(H) 30  Calcium 8.4 - 10.5 mg/dL 9.3 9.6 9.4     She should take magnesium chloride 64 mg tablet twice per day for 3 days.   She should take potassium chloride 20 meq daily for 3 days.   She should come in on 03/17/18 for repeat BMET and magnesium level checks.

## 2018-03-12 NOTE — Telephone Encounter (Signed)
Called and spoke with patient regarding results.  Informed the patient of results and recommendations today. Placed Bmet order for 03-17-18 as requested per VS Pt's mother verbalized that pt will come to labs on 03-17-18 per VS request. Pt verbalized understanding and denied any questions or concerns at this time.  Nothing further needed.

## 2018-03-16 ENCOUNTER — Ambulatory Visit (INDEPENDENT_AMBULATORY_CARE_PROVIDER_SITE_OTHER): Payer: Medicare Other | Admitting: Psychiatry

## 2018-03-16 DIAGNOSIS — F200 Paranoid schizophrenia: Secondary | ICD-10-CM

## 2018-03-16 MED ORDER — ARIPIPRAZOLE 15 MG PO TABS: 15.0000 mg | ORAL_TABLET | Freq: Every day | ORAL | 3 refills | Status: DC

## 2018-03-16 NOTE — Patient Instructions (Signed)
Continue Aripiprazole 15 mg daily

## 2018-03-16 NOTE — Progress Notes (Signed)
Heidi Good [id number] 1961-04-06 57 y.o.  Subjective:   Patient ID:  Heidi Good is a 57 y.o. (DOB 1960-05-20) female.  Chief Complaint:  Chief Complaint  Patient presents with  . Follow-up    med changes and voices    HPI Heidi Good presents to the office today for follow-up of schizophrenia. 15 mg Abilify helped her feel better, calmer than did the 10 mg Abilify.  Denies voices, depression, anger on this dosage.  Sleep good 8 hours.  No drowsiness. Watches news. October 30 Dr. Halford Chessman gave her medicine for coughing.    Other psychiatric medications tried include perphenazine which she did not like, loxapine which she did not like, and Saphris.  She complained that it gave her strange feeling under the tongue.  She has taken other psychiatric medications in the past but she does not know the names of them and we have not been able to get the records. Review of Systems:  Review of Systems  Respiratory: Positive for cough and shortness of breath.   Neurological: Negative for tremors and weakness.  Psychiatric/Behavioral: Negative for agitation, behavioral problems, confusion, decreased concentration, dysphoric mood, hallucinations, self-injury, sleep disturbance and suicidal ideas. The patient is not nervous/anxious and is not hyperactive.     Medications: I have reviewed the patient's current medications.  Current Outpatient Medications  Medication Sig Dispense Refill  . albuterol (PROVENTIL HFA;VENTOLIN HFA) 108 (90 Base) MCG/ACT inhaler Inhale 1-2 puffs into the lungs every 6 (six) hours as needed for wheezing or shortness of breath. 1 Inhaler 5  . ALPRAZolam (XANAX) 0.5 MG tablet TK 1 T PO QD PRA  0  . ARIPiprazole (ABILIFY) 15 MG tablet Take 1 tablet (15 mg total) by mouth daily. 30 tablet 3  . Cholecalciferol (VITAMIN D3) 2000 units capsule Take 1 capsule (2,000 Units total) by mouth daily. 100 capsule 3  . diclofenac (VOLTAREN) 75 MG EC tablet Take 1 tablet (75 mg  total) by mouth 2 (two) times daily. 30 tablet 0  . ergocalciferol (VITAMIN D2) 50000 units capsule Take 1 capsule (50,000 Units total) by mouth once a week. 6 capsule 0  . estradiol (ESTRACE) 1 MG tablet Take 1 tablet (1 mg total) by mouth daily. 90 tablet 3  . fluticasone (FLONASE) 50 MCG/ACT nasal spray Place 2 sprays into both nostrils daily. 16 g 2  . fluticasone (FLOVENT HFA) 110 MCG/ACT inhaler Inhale 2 puffs into the lungs 2 (two) times daily. 1 Inhaler 12  . levothyroxine (SYNTHROID, LEVOTHROID) 75 MCG tablet Take 1 tablet (75 mcg total) by mouth daily. 90 tablet 3  . loratadine (CLARITIN) 10 MG tablet Take 1 tablet (10 mg total) by mouth daily. 90 tablet 3  . Plecanatide (TRULANCE) 3 MG TABS Take 3 mg by mouth daily. 21 tablet 0  . albuterol (PROVENTIL) (2.5 MG/3ML) 0.083% nebulizer solution Take 3 mLs (2.5 mg total) by nebulization every 6 (six) hours as needed for wheezing or shortness of breath. 75 mL 5  . erythromycin ophthalmic ointment Place 1 application into the left eye at bedtime. L eye qhs x 5 days 3.5 g 0  . NITRO-BID 2 % ointment Place 1 application rectally. As needed    . potassium chloride (KLOR-CON) 8 MEQ tablet Take 1 tablet (8 mEq total) by mouth daily. (Patient not taking: Reported on 03/16/2018) 90 tablet 3   Current Facility-Administered Medications  Medication Dose Route Frequency Provider Last Rate Last Dose  . 0.9 %  sodium chloride infusion  500 mL Intravenous Continuous Nelida Meuse III, MD        Medication Side Effects: None  Allergies:  Allergies  Allergen Reactions  . Doxycycline Nausea And Vomiting  . Hydrocodone     Nausea from cough syrup  . Other     Dog, cat, nuts, grass, trees    Past Medical History:  Diagnosis Date  . Anxiety   . Constipation   . Deafness   . Depression   . Diabetes mellitus ([place])   . Gastroparesis   . Hyperlipemia   . Hypothyroidism     Family History  Problem Relation Age of Onset  . Diabetes Mother   .  Colon polyps Mother        over 150 polyps detected  . Heart disease Father   . Multiple myeloma Father   . Emphysema Father   . Heart attack Father   . Liver cancer Maternal Grandmother        not primary source, spread to her liver  . Heart attack Maternal Grandfather   . Emphysema Paternal Grandfather   . Colon cancer Neg Hx   . Esophageal cancer Neg Hx   . Rectal cancer Neg Hx   . Stomach cancer Neg Hx     Social History   Socioeconomic History  . Marital status: Divorced    Spouse name: Not on file  . Number of children: 0  . Years of education: Not on file  . Highest education level: Not on file  Occupational History  . Occupation: disablied    Employer: RETIRED  Social Needs  . Financial resource strain: Not on file  . Food insecurity:    Worry: Not on file    Inability: Not on file  . Transportation needs:    Medical: Not on file    Non-medical: Not on file  Tobacco Use  . Smoking status: Never Smoker  . Smokeless tobacco: Never Used  Substance and Sexual Activity  . Alcohol use: No  . Drug use: No  . Sexual activity: Not on file  Lifestyle  . Physical activity:    Days per week: Not on file    Minutes per session: Not on file  . Stress: Not on file  Relationships  . Social connections:    Talks on phone: Not on file    Gets together: Not on file    Attends religious service: Not on file    Active member of club or organization: Not on file    Attends meetings of clubs or organizations: Not on file    Relationship status: Not on file  . Intimate partner violence:    Fear of current or ex partner: Not on file    Emotionally abused: Not on file    Physically abused: Not on file    Forced sexual activity: Not on file  Other Topics Concern  . Not on file  Social History Narrative  . Not on file   She drives. Past Medical History, Surgical history, Social history, and Family history were reviewed and updated as appropriate.   Please see review of  systems for further details on the patient's review from today.   Objective:   Physical Exam:  There were no vitals taken for this visit.  Physical Exam  Constitutional: She is oriented to person, place, and time. She appears well-developed. No distress.  Musculoskeletal: She exhibits no deformity.  Neurological: She is alert and oriented to person, place, and time. She  displays no tremor. Coordination and gait normal.  Psychiatric: She has a normal mood and affect. Her speech is normal and behavior is normal. Judgment and thought content normal. Her mood appears not anxious. Her affect is not angry, not blunt, not labile and not inappropriate. She is not actively hallucinating. Cognition and memory are normal. She does not exhibit a depressed mood. She expresses no homicidal and no suicidal ideation. She expresses no suicidal plans and no homicidal plans.  Insight intact. No auditory or visual hallucinations. No delusions.  She is attentive.    Lab Review:     Component Value Date/Time   NA 136 03/11/2018 1616   K 3.6 03/11/2018 1616   CL 99 03/11/2018 1616   CO2 29 03/11/2018 1616   GLUCOSE 93 03/11/2018 1616   BUN 6 03/11/2018 1616   CREATININE 0.70 03/11/2018 1616   CALCIUM 9.3 03/11/2018 1616   PROT 7.5 02/02/2018 1619   ALBUMIN 3.6 02/02/2018 1619   AST 51 (H) 02/02/2018 1619   ALT 34 02/02/2018 1619   ALKPHOS 117 02/02/2018 1619   BILITOT 0.7 02/02/2018 1619   GFRNONAA 95 07/11/2008 1616   GFRAA 115 07/11/2008 1616       Component Value Date/Time   WBC 14.9 (H) 02/02/2018 1619   RBC 4.78 02/02/2018 1619   HGB 14.7 02/02/2018 1619   HCT 43.2 02/02/2018 1619   PLT 188.0 02/02/2018 1619   MCV 90.3 02/02/2018 1619   MCHC 34.0 02/02/2018 1619   RDW 13.1 02/02/2018 1619   LYMPHSABS 4.7 (H) 02/02/2018 1619   MONOABS 1.1 (H) 02/02/2018 1619   EOSABS 0.5 02/02/2018 1619   BASOSABS 0.1 02/02/2018 1619    No results found for: POCLITH, LITHIUM   No results found  for: PHENYTOIN, PHENOBARB, VALPROATE, CBMZ   .res Assessment: Plan:    Paranoid schizophrenia ([place]) Greater than 50% of face to face time with patient and an interpretor for 30 mins was spent on counseling and coordination of care. We discussed potential metabolic side effects associated with atypical antipsychotics, as well as potential risk for movement side effects. Advised pt to contact office if movement side effects occur.  She is agreed to return to Abilify 15 mg which she has taken for many years in the past to good effect.  She had a brief period of time where she was convinced it was not the right medicine for her.  We were not able to find any other medications that she felt satisfied taking.  It does not not appear that she has active psychosis at the moment.  She has a history of repeated paranoid psychotic episodes.  During some of these she could become agitated and aggressive with her mother.  She agrees that this is a good medicine for her  Her labs are followed by her primary care doctor.  This appointment was 30 minutes  Follow-up 3 months  Lynder Parents MD, DFAPA  Please see After Visit Summary for patient specific instructions.  Future Appointments  Date Time Provider Owen  03/19/2018 12:30 PM Mat Carne, PT [facility]  03/30/2018 11:00 AM Crosser, Shirley Friar, PT [facility]  04/06/2018 11:00 AM Crosser, Shirley Friar, PT [facility]  04/13/2018 11:00 AM Crosser, Shirley Friar, PT [facility]  04/20/2018 11:00 AM Crosser, Shirley Friar, PT [facility]  04/20/2018  2:00 PM [facility] MM 3 [facility]  04/27/2018 11:00 AM Crosser, Shirley Friar, PT [facility]  04/30/2018  3:20 PM Plotnikov, Tyrone Apple  V, MD [id number]  05/04/2018 11:00 AM Crosser, Shirley Friar, PT [facility]  05/11/2018 11:00 AM Crosser, Shirley Friar, PT [facility]  06/17/2018  1:30 PM Cottle, Billey Co., MD [facility] None    No orders of the defined  types were placed in this encounter.     -------------------------------

## 2018-03-17 ENCOUNTER — Telehealth: Payer: Self-pay | Admitting: Pulmonary Disease

## 2018-03-17 ENCOUNTER — Other Ambulatory Visit (INDEPENDENT_AMBULATORY_CARE_PROVIDER_SITE_OTHER): Payer: Medicare Other

## 2018-03-17 DIAGNOSIS — J4550 Severe persistent asthma, uncomplicated: Secondary | ICD-10-CM

## 2018-03-17 LAB — BASIC METABOLIC PANEL
BUN: 8 mg/dL (ref 6–23)
CO2: 30 mEq/L (ref 19–32)
Calcium: 9.1 mg/dL (ref 8.4–10.5)
Chloride: 101 mEq/L (ref 96–112)
Creatinine, Ser: 0.75 mg/dL (ref 0.40–1.20)
GFR: 84.73 mL/min (ref >60.00)
GLUCOSE: 156 mg/dL — AB (ref 70–99)
Potassium: 3.9 mEq/L (ref 3.5–5.1)
Sodium: 138 mEq/L (ref 135–145)

## 2018-03-17 NOTE — Telephone Encounter (Signed)
BMP Latest Ref Rng & Units 03/17/2018 03/11/2018 02/02/2018  Glucose 70 - 99 mg/dL 156(H) 93 103(H)  BUN 6 - 23 mg/dL 8 6 7   Creatinine 0.40 - 1.20 mg/dL 0.75 0.70 0.67  Sodium 135 - 145 mEq/L 138 136 138  Potassium 3.5 - 5.1 mEq/L 3.9 3.6 3.8  Chloride 96 - 112 mEq/L 101 99 98  CO2 19 - 32 mEq/L 30 29 33(H)  Calcium 8.4 - 10.5 mg/dL 9.1 9.3 9.6     Please let her know her electrolytes look better and rest of lab testing was normal.

## 2018-03-19 ENCOUNTER — Ambulatory Visit: Payer: Medicare Other | Attending: Gastroenterology | Admitting: Physical Therapy

## 2018-03-19 ENCOUNTER — Telehealth: Payer: Self-pay | Admitting: Pulmonary Disease

## 2018-03-19 DIAGNOSIS — R278 Other lack of coordination: Secondary | ICD-10-CM | POA: Insufficient documentation

## 2018-03-19 DIAGNOSIS — M6281 Muscle weakness (generalized): Secondary | ICD-10-CM | POA: Diagnosis not present

## 2018-03-19 DIAGNOSIS — M62838 Other muscle spasm: Secondary | ICD-10-CM | POA: Insufficient documentation

## 2018-03-19 NOTE — Telephone Encounter (Signed)
Called and spoke with patient regarding results.  Informed the patient of results and recommendations today. Pt verbalized understanding and denied any questions or concerns at this time.  Nothing further needed.  

## 2018-03-19 NOTE — Patient Instructions (Signed)
Access Code: [email or web address]  URL: [email or web address]  Date: 03/19/2018  Prepared by: Lovett Calender   Exercises  Supine Hamstring Stretch with Strap - 3 reps - 1 sets - 30 sec hold - 1x daily - 7x weekly  Supine Butterfly Groin Stretch - 3 reps - 1 sets - 30 sec hold - 1x daily - 7x weekly  Supine Diaphragmatic Breathing with Pelvic Floor Lengthening - 10 reps - 1 sets - 3x daily - 7x weekly  Seated Hamstring Stretch on Swiss Ball - 3 reps - 1 sets - 30 sec hold - 1x daily - 7x weekly

## 2018-03-19 NOTE — Therapy (Signed)
[facility] [address] [place], [place], [address] Phone: [phone]   Fax:  [phone]  Physical Therapy Treatment  Patient Details  Name: Heidi Good MRN: [id number] Date of Birth: February 10, 1961 Referring Provider (PT): Loletha Carrow Kirke Corin, MD   Encounter Date: 03/19/2018  PT End of Session - 03/19/18 1242    Visit Number  2    Date for PT Re-Evaluation  05/04/18    PT Start Time  1231    PT Stop Time  1313    PT Time Calculation (min)  42 min    Activity Tolerance  Patient tolerated treatment well    Behavior During Therapy  [facility] for tasks assessed/performed       Past Medical History:  Diagnosis Date  . Anxiety   . Constipation   . Deafness   . Depression   . Diabetes mellitus ([place])   . Gastroparesis   . Hyperlipemia   . Hypothyroidism     Past Surgical History:  Procedure Laterality Date  . ABDOMINAL HYSTERECTOMY    . ANAL RECTAL MANOMETRY N/A 03/07/2017   Procedure: ANO RECTAL MANOMETRY;  Surgeon: Mauri Pole, MD;  Location: [facility];  Service: Endoscopy;  Laterality: N/A;    There were no vitals filed for this visit.  Subjective Assessment - 03/19/18 1233    Subjective  Pt states the lotion samples helped her go to the bathroom last time.      Patient Stated Goals  less pain and bloating, able to have BM    Currently in Pain?  No/denies                       [facility] Adult PT Treatment/Exercise - 03/19/18 0001      Neuro Re-ed    Neuro Re-ed Details   biofeedback; rest 2.56mV, quick contract 12mV; 10 sec hold ave 11.84mV; 20 sec 22.74mV, after test rest 2.45mV   sitting on ball breathing and bulging            PT Education - 03/19/18 1513    Education Details   Access Code: [email or web address]     Person(s) Educated  Patient    Methods  Explanation;Demonstration;Handout;Verbal cues    Comprehension  Verbalized understanding;Returned demonstration       PT Short  Term Goals - 03/19/18 1514      PT SHORT TERM GOAL #1   Title  pt will be ind with toileting techniques    Status  On-going      PT SHORT TERM GOAL #2   Title  Pt will report skin around the anus is not chapped due to ability to moisturize correctly    Baseline  improved    Status  Achieved        PT Long Term Goals - 03/09/18 1311      PT LONG TERM GOAL #1   Title  Pt reports feeling like she is able to completely empty bowels and have 1 BM/day    Time  8    Period  Weeks    Status  New    Target Date  05/04/18      PT LONG TERM GOAL #2   Title  pt will have quarter size diameter of bowel due to improved muscle relaxation    Time  8    Period  Weeks    Status  New    Target Date  05/04/18  PT LONG TERM GOAL #3   Title  pt will be ind with advanced HEP    Time  8    Period  Weeks    Status  New    Target Date  05/04/18      PT LONG TERM GOAL #4   Title  pt will report 50% less abdominal cramping and pain due to improved muscle coordination    Time  8    Period  Weeks    Status  New    Target Date  05/04/18            Plan - 03/19/18 1506    Clinical Impression Statement  Pt reports her bowel movements have improved since using the moisturizer. Pt did well with biofeedback.  She was able to perform contract and relax with good variability of tone.  Pt was able to perform quickly.  She did demonstrate some difficulty with endurance when holding past 5 seconds.  Pt was able to undertsand diaphragmatic breathing technique.  She was able to understand and feel the pelvic floor bulging.  Pt will benefit from skilled PT to continue with exercises address muscle coordination and strength.    PT Treatment/Interventions  ADLs/Self Care Home Management;Biofeedback;Electrical Stimulation;Cryotherapy;Moist Heat;Therapeutic activities;Therapeutic exercise;Neuromuscular re-education;Patient/family education;Manual techniques;Passive range of motion;Dry needling;Taping    PT  Next Visit Plan  breathing and bulging, sitting on ball, lumbar fascial release, toilet technique    PT Home Exercise Plan   Access Code: [email or web address]     Recommended Other Services  eval 03/09/18 - signed    Consulted and Agree with Plan of Care  Patient       Patient will benefit from skilled therapeutic intervention in order to improve the following deficits and impairments:  Abnormal gait, Pain, Increased fascial restricitons, Increased muscle spasms, Decreased strength, Decreased range of motion, Impaired tone, Postural dysfunction  Visit Diagnosis: Other muscle spasm  Other lack of coordination  Muscle weakness (generalized)     Problem List Patient Active Problem List   Diagnosis Date Noted  . Stye external 11/21/2017  . Allergic rhinitis 11/05/2017  . Restless legs syndrome (RLS) 08/05/2017  . Insomnia 08/05/2017  . Paresthesia 04/07/2017  . Foot pain, left 04/07/2017  . Plantar fasciitis 02/26/2017  . Chest pain, atypical 12/04/2016  . Snoring 09/25/2016  . Arthralgia 09/04/2016  . GERD (gastroesophageal reflux disease) 04/03/2016  . Fatigue 08/02/2015  . Acute sinusitis 08/02/2015  . Hip pain, bilateral 06/01/2015  . Prediabetes 03/08/2015  . Hyperlipidemia 03/08/2015  . Constipation 07/17/2013  . Seasonal and perennial allergic rhinitis 08/28/2011  . Asthma, mild intermittent 08/28/2011  . Obstructive sleep apnea 08/28/2011  . Gastroparesis 04/25/2010  . IBS 08/16/2008  . Hypothyroidism 07/06/2008  . Paranoid schizophrenia, chronic condition ([place]) 07/06/2008  . Major depressive disorder, single episode, moderate ([place]) 07/06/2008  . Hearing loss 07/06/2008    Zannie Cove, PT 03/19/2018, 3:16 PM  [facility] [address], [place], [place], [address] Phone: [phone]   Fax:  [phone]  Name: Heidi Good MRN: [id number] Date of Birth: 10/19/1960

## 2018-03-19 NOTE — Telephone Encounter (Signed)
Per [facility] documentation patient has been called and is aware. Will close this encounter as another has been open.

## 2018-03-19 NOTE — Telephone Encounter (Signed)
Called and spoke with patient mother, she is requesting labs for patinet, informed patient labs have not resulted yet. Voiced understanding.   VS please advise.

## 2018-03-19 NOTE — Telephone Encounter (Signed)
Please see phone note from 03/17/18.

## 2018-03-30 ENCOUNTER — Ambulatory Visit: Payer: Medicare Other | Admitting: Physical Therapy

## 2018-03-30 ENCOUNTER — Encounter: Payer: Self-pay | Admitting: Physical Therapy

## 2018-03-30 DIAGNOSIS — M6281 Muscle weakness (generalized): Secondary | ICD-10-CM

## 2018-03-30 DIAGNOSIS — R278 Other lack of coordination: Secondary | ICD-10-CM | POA: Diagnosis not present

## 2018-03-30 DIAGNOSIS — M62838 Other muscle spasm: Secondary | ICD-10-CM

## 2018-03-30 NOTE — Therapy (Signed)
[facility] [address] [place], [place], [address] Phone: [phone]   Fax:  [phone]  Physical Therapy Treatment  Patient Details  Name: Heidi Good MRN: [id number] Date of Birth: [birth year] Referring Provider (PT): Loletha Carrow Kirke Corin, MD   Encounter Date: 03/30/2018  PT End of Session - 03/30/18 1102    Visit Number  3    Date for PT Re-Evaluation  05/04/18    Authorization Type  medicare    PT Start Time  1102    PT Stop Time  1142    PT Time Calculation (min)  40 min    Activity Tolerance  Patient tolerated treatment well    Behavior During Therapy  [facility] for tasks assessed/performed       Past Medical History:  Diagnosis Date  . Anxiety   . Constipation   . Deafness   . Depression   . Diabetes mellitus ([place])   . Gastroparesis   . Hyperlipemia   . Hypothyroidism     Past Surgical History:  Procedure Laterality Date  . ABDOMINAL HYSTERECTOMY    . ANAL RECTAL MANOMETRY N/A 03/07/2017   Procedure: ANO RECTAL MANOMETRY;  Surgeon: Mauri Pole, MD;  Location: [facility];  Service: Endoscopy;  Laterality: N/A;    There were no vitals filed for this visit.  Subjective Assessment - 03/30/18 1153    Subjective  I have been going to the bathroom much better, I haven't had any problems lately    Patient Stated Goals  less pain and bloating, able to have BM    Currently in Pain?  No/denies                       [facility] Adult PT Treatment/Exercise - 03/30/18 0001      Neuro Re-ed    Neuro Re-ed Details   TrA activation while keeping pelvic floor relaxed; sitting on ball and circles both ways core and pelvic floor warm up      Exercises   Exercises  Lumbar      Lumbar Exercises: Stretches   Hip Flexor Stretch  Right;Left;1 rep;20 seconds    Quad Stretch  Right;Left;3 reps;20 seconds    Gastroc Stretch  Right;Left;1 rep;20 seconds      Lumbar Exercises: Seated   Long  Arc Quad on [place]  Strengthening;Right;Left;20 reps   TrA activation     Lumbar Exercises: Supine   Ab Set  5 reps;5 seconds    Clam  20 reps;2 seconds   yellow band   Bent Knee Raise  20 reps;3 seconds    Large Ball Abdominal Isometric  20 reps;2 seconds      Lumbar Exercises: Sidelying   Clam  Right;Left;15 reps;1 second      Manual Therapy   Manual Therapy  Soft tissue mobilization    Manual therapy comments  lumbar paraspinals and Left glutes               PT Short Term Goals - 03/19/18 1514      PT SHORT TERM GOAL #1   Title  pt will be ind with toileting techniques    Status  On-going      PT SHORT TERM GOAL #2   Title  Pt will report skin around the anus is not chapped due to ability to moisturize correctly    Baseline  improved    Status  Achieved  PT Long Term Goals - 03/09/18 1311      PT LONG TERM GOAL #1   Title  Pt reports feeling like she is able to completely empty bowels and have 1 BM/day    Time  8    Period  Weeks    Status  New    Target Date  05/04/18      PT LONG TERM GOAL #2   Title  pt will have quarter size diameter of bowel due to improved muscle relaxation    Time  8    Period  Weeks    Status  New    Target Date  05/04/18      PT LONG TERM GOAL #3   Title  pt will be ind with advanced HEP    Time  8    Period  Weeks    Status  New    Target Date  05/04/18      PT LONG TERM GOAL #4   Title  pt will report 50% less abdominal cramping and pain due to improved muscle coordination    Time  8    Period  Weeks    Status  New    Target Date  05/04/18            Plan - 03/30/18 1154    Clinical Impression Statement  Pt is showing a lot of improvement and doing well with toileting techniques and home exercises.  Pt has muscle spasm in lumbar and left glutes that released with soft tissue mobs today.  She was given additional stretches.  Pt needed cues to address TrA activation during exercises.  She will benefit from  skilled PT to work on core strength and muscle coordination.    PT Treatment/Interventions  ADLs/Self Care Home Management;Biofeedback;Electrical Stimulation;Cryotherapy;Moist Heat;Therapeutic activities;Therapeutic exercise;Neuromuscular re-education;Patient/family education;Manual techniques;Passive range of motion;Dry needling;Taping    PT Next Visit Plan  breathing and bulging, progress core strength as needed    PT Home Exercise Plan   Access Code: [email or web address]     Consulted and Agree with Plan of Care  Patient       Patient will benefit from skilled therapeutic intervention in order to improve the following deficits and impairments:  Abnormal gait, Pain, Increased fascial restricitons, Increased muscle spasms, Decreased strength, Decreased range of motion, Impaired tone, Postural dysfunction  Visit Diagnosis: Other muscle spasm  Other lack of coordination  Muscle weakness (generalized)     Problem List Patient Active Problem List   Diagnosis Date Noted  . Stye external 11/21/2017  . Allergic rhinitis 11/05/2017  . Restless legs syndrome (RLS) 08/05/2017  . Insomnia 08/05/2017  . Paresthesia 04/07/2017  . Foot pain, left 04/07/2017  . Plantar fasciitis 02/26/2017  . Chest pain, atypical 12/04/2016  . Snoring 09/25/2016  . Arthralgia 09/04/2016  . GERD (gastroesophageal reflux disease) 04/03/2016  . Fatigue 08/02/2015  . Acute sinusitis 08/02/2015  . Hip pain, bilateral 06/01/2015  . Prediabetes 03/08/2015  . Hyperlipidemia 03/08/2015  . Constipation 07/17/2013  . Seasonal and perennial allergic rhinitis 08/28/2011  . Asthma, mild intermittent 08/28/2011  . Obstructive sleep apnea 08/28/2011  . Gastroparesis 04/25/2010  . IBS 08/16/2008  . Hypothyroidism 07/06/2008  . Paranoid schizophrenia, chronic condition ([place]) 07/06/2008  . Major depressive disorder, single episode, moderate ([place]) 07/06/2008  . Hearing loss 07/06/2008    Zannie Cove, PT 03/30/2018, 12:41  PM  [facility] [address], [place], [place], [address] Phone: [phone]  Fax:  [phone]  Name: BRIANNE MAINA MRN: [id number] Date of Birth: 01-Aug-1960

## 2018-04-06 ENCOUNTER — Ambulatory Visit: Payer: Medicare Other | Admitting: Physical Therapy

## 2018-04-06 ENCOUNTER — Encounter: Payer: Self-pay | Admitting: Physical Therapy

## 2018-04-06 DIAGNOSIS — R278 Other lack of coordination: Secondary | ICD-10-CM | POA: Diagnosis not present

## 2018-04-06 DIAGNOSIS — M6281 Muscle weakness (generalized): Secondary | ICD-10-CM | POA: Diagnosis not present

## 2018-04-06 DIAGNOSIS — M62838 Other muscle spasm: Secondary | ICD-10-CM

## 2018-04-06 NOTE — Therapy (Signed)
[facility] [address] [place], [place], [address] Phone: [phone]   Fax:  [phone]  Physical Therapy Treatment  Patient Details  Name: Heidi Good MRN: [id number] Date of Birth: 04/29/1961 Referring Provider (PT): Loletha Carrow Kirke Corin, MD   Encounter Date: 04/06/2018  PT End of Session - 04/06/18 1045    Visit Number  4    Date for PT Re-Evaluation  05/04/18    Authorization Type  medicare    PT Start Time  1045    PT Stop Time  1126    PT Time Calculation (min)  41 min    Activity Tolerance  Patient tolerated treatment well    Behavior During Therapy  [facility] for tasks assessed/performed       Past Medical History:  Diagnosis Date  . Anxiety   . Constipation   . Deafness   . Depression   . Diabetes mellitus ([place])   . Gastroparesis   . Hyperlipemia   . Hypothyroidism     Past Surgical History:  Procedure Laterality Date  . ABDOMINAL HYSTERECTOMY    . ANAL RECTAL MANOMETRY N/A 03/07/2017   Procedure: ANO RECTAL MANOMETRY;  Surgeon: Mauri Pole, MD;  Location: [facility];  Service: Endoscopy;  Laterality: N/A;    There were no vitals filed for this visit.  Subjective Assessment - 04/06/18 1051    Subjective  I am feeling fine.  This morning I had diarrhea but I am better now.    Patient Stated Goals  less pain and bloating, able to have BM    Currently in Pain?  No/denies                       [facility] Adult PT Treatment/Exercise - 04/06/18 0001      Neuro Re-ed    Neuro Re-ed Details   TrA activation while keeping pelvic floor relaxed; sitting on ball and circles both ways core and pelvic floor warm up      Exercises   Exercises  Lumbar      Lumbar Exercises: Stretches   Hip Flexor Stretch  Right;Left;1 rep;20 seconds    Quad Stretch  Right;Left;3 reps;20 seconds    Gastroc Stretch  Right;Left;1 rep;20 seconds      Lumbar Exercises: Aerobic   Nustep  L1 x 5  min   PT present to give cues for core/TrA activation     Lumbar Exercises: Standing   Row  Strengthening;Both;20 reps;Theraband    Theraband Level (Row)  Level 3 (Green)    Shoulder Extension  Strengthening;Both;20 reps;Theraband    Theraband Level (Shoulder Extension)  Level 3 (Green)      Lumbar Exercises: Seated   Long Arc Quad on [place]  Strengthening;Right;Left;20 reps   TrA activation   Hip Flexion on Ball  Strengthening;Right;Left;10 reps    Other Seated Lumbar Exercises  h/s stretch on pball- 2 x 30 sec      Lumbar Exercises: Supine   Ab Set  --    Clam  20 reps;2 seconds   green band   Bent Knee Raise  20 reps;3 seconds    Large Ball Abdominal Isometric  20 reps;2 seconds      Lumbar Exercises: Sidelying   Clam  Right;Left;15 reps;1 second      Lumbar Exercises: Quadruped   Single Arm Raise  Right;Left;10 reps    Straight Leg Raise  10 reps    Opposite Arm/Leg Raise  Right arm/Left leg;Left arm/Right leg;5 reps      Manual Therapy   Manual Therapy  --    Manual therapy comments  --               PT Short Term Goals - 03/19/18 1514      PT SHORT TERM GOAL #1   Title  pt will be ind with toileting techniques    Status  On-going      PT SHORT TERM GOAL #2   Title  Pt will report skin around the anus is not chapped due to ability to moisturize correctly    Baseline  improved    Status  Achieved        PT Long Term Goals - 04/06/18 1134      PT LONG TERM GOAL #1   Title  Pt reports feeling like she is able to completely empty bowels and have 1 BM/day    Baseline  lately no problems, not feeling any abdominal bloating - had some diarrhea this morning  but that is unusual and not sure what that is related to    Status  On-going      PT LONG TERM GOAL #2   Title  pt will have quarter size diameter of bowel due to improved muscle relaxation    Status  On-going      PT LONG TERM GOAL #3   Title  pt will be ind with advanced HEP    Status  On-going       PT LONG TERM GOAL #4   Title  pt will report 50% less abdominal cramping and pain due to improved muscle coordination    Baseline  better    Status  On-going            Plan - 04/06/18 1133    Clinical Impression Statement  Pt was able to progress exercises today.  She is doing well with gaining stability.  She needs cues to maintain good posture and demonstrate TrA throughout exercises. She will benefit from skilled PT to continue to work on improved trunk stability.    PT Treatment/Interventions  ADLs/Self Care Home Management;Biofeedback;Electrical Stimulation;Cryotherapy;Moist Heat;Therapeutic activities;Therapeutic exercise;Neuromuscular re-education;Patient/family education;Manual techniques;Passive range of motion;Dry needling;Taping    PT Next Visit Plan  breathing and bulging, progress core strength     PT Home Exercise Plan   Access Code: [email or web address]     Consulted and Agree with Plan of Care  Patient       Patient will benefit from skilled therapeutic intervention in order to improve the following deficits and impairments:  Abnormal gait, Pain, Increased fascial restricitons, Increased muscle spasms, Decreased strength, Decreased range of motion, Impaired tone, Postural dysfunction  Visit Diagnosis: Other muscle spasm  Other lack of coordination  Muscle weakness (generalized)     Problem List Patient Active Problem List   Diagnosis Date Noted  . Stye external 11/21/2017  . Allergic rhinitis 11/05/2017  . Restless legs syndrome (RLS) 08/05/2017  . Insomnia 08/05/2017  . Paresthesia 04/07/2017  . Foot pain, left 04/07/2017  . Plantar fasciitis 02/26/2017  . Chest pain, atypical 12/04/2016  . Snoring 09/25/2016  . Arthralgia 09/04/2016  . GERD (gastroesophageal reflux disease) 04/03/2016  . Fatigue 08/02/2015  . Acute sinusitis 08/02/2015  . Hip pain, bilateral 06/01/2015  . Prediabetes 03/08/2015  . Hyperlipidemia 03/08/2015  . Constipation 07/17/2013   . Seasonal and perennial allergic rhinitis 08/28/2011  . Asthma, mild intermittent 08/28/2011  . Obstructive sleep apnea  08/28/2011  . Gastroparesis 04/25/2010  . IBS 08/16/2008  . Hypothyroidism 07/06/2008  . Paranoid schizophrenia, chronic condition ([place]) 07/06/2008  . Major depressive disorder, single episode, moderate ([place]) 07/06/2008  . Hearing loss 07/06/2008    Zannie Cove, PT 04/06/2018, 11:36 AM  [facility] [address] [place], [place], [address] Phone: [phone]   Fax:  [phone]  Name: JEM CASTRO MRN: [id number] Date of Birth: 1960-08-10

## 2018-04-13 ENCOUNTER — Ambulatory Visit: Payer: Medicare Other | Attending: Gastroenterology | Admitting: Physical Therapy

## 2018-04-13 ENCOUNTER — Encounter: Payer: Self-pay | Admitting: Physical Therapy

## 2018-04-13 DIAGNOSIS — M6281 Muscle weakness (generalized): Secondary | ICD-10-CM | POA: Diagnosis not present

## 2018-04-13 DIAGNOSIS — M62838 Other muscle spasm: Secondary | ICD-10-CM

## 2018-04-13 DIAGNOSIS — R278 Other lack of coordination: Secondary | ICD-10-CM | POA: Diagnosis not present

## 2018-04-13 NOTE — Patient Instructions (Signed)
Access Code: [email or web address]  URL: [email or web address]  Date: 04/13/2018  Prepared by: Lovett Calender   Exercises  Supine Hamstring Stretch with Strap - 3 reps - 1 sets - 30 sec hold - 1x daily - 7x weekly  Supine Butterfly Groin Stretch - 3 reps - 1 sets - 30 sec hold - 1x daily - 7x weekly  Supine Diaphragmatic Breathing with Pelvic Floor Lengthening - 10 reps - 1 sets - 3x daily - 7x weekly  Seated Hamstring Stretch on Swiss Ball - 3 reps - 1 sets - 30 sec hold - 1x daily - 7x weekly  Standing Ankle Dorsiflexion Stretch - 3 reps - 1 sets - 30 sec hold - 1x daily - 7x weekly  Prone Quadriceps Stretch with Strap - 3 reps - 1 sets - 30 sec hold - 1x daily - 7x weekly  Swiss Ball Knee Extension - 10 reps - 3 sets - 1x daily - 7x weekly  Bird Dog - 10 reps - 1 sets - 1x daily - 7x weekly  Dead Bug - 10 reps - 1 sets - 1x daily - 7x weekly

## 2018-04-13 NOTE — Therapy (Signed)
[facility] [address] [place], [place], [address] Phone: [phone]   Fax:  [phone]  Physical Therapy Treatment  Patient Details  Name: Heidi Good MRN: [id number] Date of Birth: [birth year] Referring Provider (PT): Loletha Carrow Kirke Corin, MD   Encounter Date: 04/13/2018  PT End of Session - 04/13/18 1049    Visit Number  5    Date for PT Re-Evaluation  05/04/18    Authorization Type  medicare    PT Start Time  1049    PT Stop Time  1130    PT Time Calculation (min)  41 min    Activity Tolerance  Patient tolerated treatment well    Behavior During Therapy  [facility] for tasks assessed/performed       Past Medical History:  Diagnosis Date  . Anxiety   . Constipation   . Deafness   . Depression   . Diabetes mellitus ([place])   . Gastroparesis   . Hyperlipemia   . Hypothyroidism     Past Surgical History:  Procedure Laterality Date  . ABDOMINAL HYSTERECTOMY    . ANAL RECTAL MANOMETRY N/A 03/07/2017   Procedure: ANO RECTAL MANOMETRY;  Surgeon: Mauri Pole, MD;  [facility];  Service: Endoscopy;  Laterality: N/A;    There were no vitals filed for this visit.  Subjective Assessment - 04/13/18 1136    Subjective  I am good, no problem with bowel movements, exercises are going fine.    Patient Stated Goals  less pain and bloating, able to have BM    Currently in Pain?  No/denies                       [facility] Adult PT Treatment/Exercise - 04/13/18 0001      Neuro Re-ed    Neuro Re-ed Details   TrA activation while keeping pelvic floor relaxed; sitting on ball and circles both ways; exercisres on ball      Exercises   Exercises  Lumbar      Lumbar Exercises: Stretches   Active Hamstring Stretch  Right;Left;3 reps;20 seconds    Gastroc Stretch  Right;Left;20 seconds;3 reps      Lumbar Exercises: Aerobic   Nustep  L1 x 5 min   PT present to give cues for core/TrA  activation     Lumbar Exercises: Seated   Long Arc Quad on [place]  Strengthening;Right;Left;20 reps   TrA activation   Hip Flexion on Ball  Strengthening;Right;Left;10 reps    Hip Flexion on Ball Limitations  add 2 lb in hands with shoulder flexion    Other Seated Lumbar Exercises  shoulder flexion 2lb x 20      Lumbar Exercises: Supine   Dead Bug  20 reps;2 seconds;Limitations    Dead Bug Limitations  2 lb in hands    Large Ball Oblique Isometric  20 reps   ball overhead and roll out     Lumbar Exercises: Sidelying   Other Sidelying Lumbar Exercises  thoracic rotation 5 x 10 sec hold       Lumbar Exercises: Prone   Straight Leg Raise  10 reps      Lumbar Exercises: Quadruped   Opposite Arm/Leg Raise  Right arm/Left leg;Left arm/Right leg;5 reps   unable to finish due to knee pain   Other Quadruped Lumbar Exercises  --             PT Education - 04/13/18 1135  Education Details   Access Code: [email or web address]     Person(s) Educated  Patient    Methods  Explanation;Demonstration;Handout;Verbal cues    Comprehension  Verbalized understanding;Returned demonstration       PT Short Term Goals - 03/19/18 1514      PT SHORT TERM GOAL #1   Title  pt will be ind with toileting techniques    Status  On-going      PT SHORT TERM GOAL #2   Title  Pt will report skin around the anus is not chapped due to ability to moisturize correctly    Baseline  improved    Status  Achieved        PT Long Term Goals - 04/13/18 1140      PT LONG TERM GOAL #1   Title  Pt reports feeling like she is able to completely empty bowels and have 1 BM/day    Status  Achieved      PT LONG TERM GOAL #2   Title  pt will have quarter size diameter of bowel due to improved muscle relaxation    Status  Achieved            Plan - 04/13/18 1137    Clinical Impression Statement  Pt has met long term goal as mentioned above.  She continues to make progress with core strength.  Pt will benefit from  skilled PT to educate on progress core strength.    PT Treatment/Interventions  ADLs/Self Care Home Management;Biofeedback;Electrical Stimulation;Cryotherapy;Moist Heat;Therapeutic activities;Therapeutic exercise;Neuromuscular re-education;Patient/family education;Manual techniques;Passive range of motion;Dry needling;Taping    PT Next Visit Plan  breathing and bulging, progress core strength     PT Home Exercise Plan   Access Code: [email or web address]     Consulted and Agree with Plan of Care  Patient       Patient will benefit from skilled therapeutic intervention in order to improve the following deficits and impairments:  Abnormal gait, Pain, Increased fascial restricitons, Increased muscle spasms, Decreased strength, Decreased range of motion, Impaired tone, Postural dysfunction  Visit Diagnosis: Other muscle spasm  Other lack of coordination  Muscle weakness (generalized)     Problem List Patient Active Problem List   Diagnosis Date Noted  . Stye external 11/21/2017  . Allergic rhinitis 11/05/2017  . Restless legs syndrome (RLS) 08/05/2017  . Insomnia 08/05/2017  . Paresthesia 04/07/2017  . Foot pain, left 04/07/2017  . Plantar fasciitis 02/26/2017  . Chest pain, atypical 12/04/2016  . Snoring 09/25/2016  . Arthralgia 09/04/2016  . GERD (gastroesophageal reflux disease) 04/03/2016  . Fatigue 08/02/2015  . Acute sinusitis 08/02/2015  . Hip pain, bilateral 06/01/2015  . Prediabetes 03/08/2015  . Hyperlipidemia 03/08/2015  . Constipation 07/17/2013  . Seasonal and perennial allergic rhinitis 08/28/2011  . Asthma, mild intermittent 08/28/2011  . Obstructive sleep apnea 08/28/2011  . Gastroparesis 04/25/2010  . IBS 08/16/2008  . Hypothyroidism 07/06/2008  . Paranoid schizophrenia, chronic condition ([place]) 07/06/2008  . Major depressive disorder, single episode, moderate ([place]) 07/06/2008  . Hearing loss 07/06/2008    Zannie Cove, PT 04/13/2018, 11:40 AM  [facility] [address] [place], [place], [address] Phone: [phone]   Fax:  [phone]  Name: ADLEIGH MCMASTERS MRN: [id number] Date of Birth: 07-Mar-1961

## 2018-04-15 ENCOUNTER — Ambulatory Visit: Payer: Medicare Other | Admitting: Cardiovascular Disease

## 2018-04-20 ENCOUNTER — Ambulatory Visit: Payer: Self-pay

## 2018-04-20 ENCOUNTER — Ambulatory Visit: Payer: Medicare Other | Admitting: Physical Therapy

## 2018-04-20 DIAGNOSIS — M62838 Other muscle spasm: Secondary | ICD-10-CM | POA: Diagnosis not present

## 2018-04-20 DIAGNOSIS — M6281 Muscle weakness (generalized): Secondary | ICD-10-CM | POA: Diagnosis not present

## 2018-04-20 DIAGNOSIS — R278 Other lack of coordination: Secondary | ICD-10-CM

## 2018-04-20 NOTE — Therapy (Signed)
[facility] [address] [place], [place], [address] Phone: [phone]   Fax:  [phone]  Physical Therapy Treatment  Patient Details  Name: Heidi Good MRN: [id number] Date of Birth: [birth year] Referring Provider (PT): Loletha Carrow Kirke Corin, MD   Encounter Date: 04/20/2018  PT End of Session - 04/20/18 1144    Visit Number  6    Date for PT Re-Evaluation  05/04/18    Authorization Type  medicare    PT Start Time  1101    PT Stop Time  1140    PT Time Calculation (min)  39 min    Activity Tolerance  Patient tolerated treatment well    Behavior During Therapy  [facility] for tasks assessed/performed       Past Medical History:  Diagnosis Date  . Anxiety   . Constipation   . Deafness   . Depression   . Diabetes mellitus ([place])   . Gastroparesis   . Hyperlipemia   . Hypothyroidism     Past Surgical History:  Procedure Laterality Date  . ABDOMINAL HYSTERECTOMY    . ANAL RECTAL MANOMETRY N/A 03/07/2017   Procedure: ANO RECTAL MANOMETRY;  Surgeon: Mauri Pole, MD;  [facility];  Service: Endoscopy;  Laterality: N/A;    There were no vitals filed for this visit.  Subjective Assessment - 04/20/18 1141    Subjective  I am feeling good and not having any cramps or problem with BMs at this time.  I feel ready to discharge.    Patient is accompained by:  Interpreter    Patient Stated Goals  less pain and bloating, able to have BM    Currently in Pain?  No/denies                       [facility] Adult PT Treatment/Exercise - 04/20/18 0001      Exercises   Exercises  Lumbar      Lumbar Exercises: Stretches   Active Hamstring Stretch  Right;Left;3 reps;20 seconds    Single Knee to Chest Stretch  Right;Left;5 reps;10 seconds    Gastroc Stretch  Right;Left;20 seconds;3 reps    Other Lumbar Stretch Exercise  thoracic rotation - 5 saec x 5      Lumbar Exercises: Aerobic   Nustep  L1  x 5 min   PT present to give cues for core/TrA activation     Lumbar Exercises: Standing   Row  Strengthening;Both;20 reps;Limitations    Row Limitations  25#    Shoulder Extension  Strengthening;[place];20 reps    Shoulder Extension Limitations  25#      Lumbar Exercises: Seated   Long Arc Quad on [place]  Strengthening;Right;Left;20 reps   TrA activation   Hip Flexion on Ball  Strengthening;Right;Left;10 reps    Other Seated Lumbar Exercises  shoulder flexion 2lb x 20      Lumbar Exercises: Supine   Clam  20 reps   on foam roll; red   Large Ball Abdominal Isometric  20 reps;2 seconds    Large Ball Oblique Isometric  20 reps   ball overhead and roll out   Other Supine Lumbar Exercises  bent knee drop on foam roll 20x    Other Supine Lumbar Exercises  red band - horizontal abduction and diagonals 20 xeach side      Lumbar Exercises: Quadruped   Opposite Arm/Leg Raise  Right arm/Left leg;Left arm/Right leg;5 reps  on green ball              PT Short Term Goals - 04/20/18 1136      PT SHORT TERM GOAL #1   Title  pt will be ind with toileting techniques    Status  Achieved        PT Long Term Goals - 04/20/18 1136      PT LONG TERM GOAL #1   Title  Pt reports feeling like she is able to completely empty bowels and have 1 BM/day    Status  Achieved      PT LONG TERM GOAL #2   Title  pt will have quarter size diameter of bowel due to improved muscle relaxation    Status  Achieved      PT LONG TERM GOAL #3   Title  pt will be ind with advanced HEP    Status  Achieved      PT LONG TERM GOAL #4   Title  pt will report 50% less abdominal cramping and pain due to improved muscle coordination    Baseline  no cramping    Status  Achieved            Plan - 04/20/18 1137    Clinical Impression Statement  Pt has met all goals and is ind with her HEP.  Pt will benefit from discharge with HEP at this time.    PT Treatment/Interventions  ADLs/Self Care Home  Management;Biofeedback;Electrical Stimulation;Cryotherapy;Moist Heat;Therapeutic activities;Therapeutic exercise;Neuromuscular re-education;Patient/family education;Manual techniques;Passive range of motion;Dry needling;Taping    PT Next Visit Plan  breathing and bulging, progress core strength     PT Home Exercise Plan   Access Code: [email or web address]     Consulted and Agree with Plan of Care  Patient       Patient will benefit from skilled therapeutic intervention in order to improve the following deficits and impairments:  Abnormal gait, Pain, Increased fascial restricitons, Increased muscle spasms, Decreased strength, Decreased range of motion, Impaired tone, Postural dysfunction  Visit Diagnosis: Other muscle spasm  Other lack of coordination  Muscle weakness (generalized)     Problem List Patient Active Problem List   Diagnosis Date Noted  . Stye external 11/21/2017  . Allergic rhinitis 11/05/2017  . Restless legs syndrome (RLS) 08/05/2017  . Insomnia 08/05/2017  . Paresthesia 04/07/2017  . Foot pain, left 04/07/2017  . Plantar fasciitis 02/26/2017  . Chest pain, atypical 12/04/2016  . Snoring 09/25/2016  . Arthralgia 09/04/2016  . GERD (gastroesophageal reflux disease) 04/03/2016  . Fatigue 08/02/2015  . Acute sinusitis 08/02/2015  . Hip pain, bilateral 06/01/2015  . Prediabetes 03/08/2015  . Hyperlipidemia 03/08/2015  . Constipation 07/17/2013  . Seasonal and perennial allergic rhinitis 08/28/2011  . Asthma, mild intermittent 08/28/2011  . Obstructive sleep apnea 08/28/2011  . Gastroparesis 04/25/2010  . IBS 08/16/2008  . Hypothyroidism 07/06/2008  . Paranoid schizophrenia, chronic condition ([place]) 07/06/2008  . Major depressive disorder, single episode, moderate ([place]) 07/06/2008  . Hearing loss 07/06/2008    Zannie Cove 04/20/2018, 11:44 AM  [facility] [address], [place], [place], [address] Phone:  [phone]   Fax:  [phone]  Name: Heidi Good MRN: [id number] Date of Birth: 1960-07-30  PHYSICAL THERAPY DISCHARGE SUMMARY  Visits from Start of Care: 6  Current functional level related to goals / functional outcomes: See above goals   Remaining deficits: See above   Education / Equipment: HEP  Plan: Patient  agrees to discharge.  Patient goals were met. Patient is being discharged due to meeting the stated rehab goals.  ?????     [employer], PT 04/20/18 11:45 AM

## 2018-04-27 ENCOUNTER — Encounter: Payer: Self-pay | Admitting: Physical Therapy

## 2018-04-28 ENCOUNTER — Ambulatory Visit: Payer: Medicare Other | Admitting: Cardiovascular Disease

## 2018-04-30 ENCOUNTER — Encounter: Payer: Self-pay | Admitting: Internal Medicine

## 2018-04-30 ENCOUNTER — Ambulatory Visit (INDEPENDENT_AMBULATORY_CARE_PROVIDER_SITE_OTHER): Payer: Medicare Other | Admitting: Internal Medicine

## 2018-04-30 VITALS — BP 136/82 | HR 74 | Temp 98.4°F | Ht 65.0 in | Wt 199.0 lb

## 2018-04-30 DIAGNOSIS — J4520 Mild intermittent asthma, uncomplicated: Secondary | ICD-10-CM | POA: Diagnosis not present

## 2018-04-30 DIAGNOSIS — F321 Major depressive disorder, single episode, moderate: Secondary | ICD-10-CM

## 2018-04-30 DIAGNOSIS — R002 Palpitations: Secondary | ICD-10-CM | POA: Diagnosis not present

## 2018-04-30 DIAGNOSIS — R7303 Prediabetes: Secondary | ICD-10-CM

## 2018-04-30 DIAGNOSIS — J302 Other seasonal allergic rhinitis: Secondary | ICD-10-CM | POA: Diagnosis not present

## 2018-04-30 DIAGNOSIS — J3089 Other allergic rhinitis: Secondary | ICD-10-CM

## 2018-04-30 MED ORDER — LEVOTHYROXINE SODIUM 75 MCG PO TABS: 75.0000 ug | ORAL_TABLET | Freq: Every day | ORAL | 3 refills | Status: DC

## 2018-04-30 MED ORDER — ESTRADIOL 1 MG PO TABS: 1.0000 mg | ORAL_TABLET | Freq: Every day | ORAL | 3 refills | Status: DC

## 2018-04-30 NOTE — Assessment & Plan Note (Signed)
Labs

## 2018-04-30 NOTE — Assessment & Plan Note (Addendum)
   The pt saw Dr Halford Chessman. S tachy w/PVCs. [name] was stopped. She will see Dr Gwenlyn Found in Jan 2020

## 2018-04-30 NOTE — Assessment & Plan Note (Signed)
F/u w/ Dr Halford Chessman. Flovent

## 2018-04-30 NOTE — Addendum Note (Signed)
Addended by: Cassandria Anger on: 04/30/2018 05:13 PM   Modules accepted: Orders

## 2018-04-30 NOTE — Progress Notes (Signed)
Subjective:  Patient ID: Heidi Good, female    DOB: 06/05/1960  Age: 57 y.o. MRN: [id number]  CC: No chief complaint on file.   HPI Heidi Good presents for palpitations, asthma, anxiety f/u. The pt saw Dr Halford Chessman. [name] was stopped. She will see Dr Gwenlyn Found in Jan 2020 On HRT - mammo q 12 no  Outpatient Medications Prior to Visit  Medication Sig Dispense Refill  . albuterol (PROVENTIL HFA;VENTOLIN HFA) 108 (90 Base) MCG/ACT inhaler Inhale 1-2 puffs into the lungs every 6 (six) hours as needed for wheezing or shortness of breath. 1 Inhaler 5  . albuterol (PROVENTIL) (2.5 MG/3ML) 0.083% nebulizer solution Take 3 mLs (2.5 mg total) by nebulization every 6 (six) hours as needed for wheezing or shortness of breath. 75 mL 5  . ALPRAZolam (XANAX) 0.5 MG tablet TK 1 T PO QD PRA  0  . ARIPiprazole (ABILIFY) 15 MG tablet Take 1 tablet (15 mg total) by mouth daily. 30 tablet 3  . Cholecalciferol (VITAMIN D3) 2000 units capsule Take 1 capsule (2,000 Units total) by mouth daily. 100 capsule 3  . diclofenac (VOLTAREN) 75 MG EC tablet Take 1 tablet (75 mg total) by mouth 2 (two) times daily. 30 tablet 0  . ergocalciferol (VITAMIN D2) 50000 units capsule Take 1 capsule (50,000 Units total) by mouth once a week. 6 capsule 0  . erythromycin ophthalmic ointment Place 1 application into the left eye at bedtime. L eye qhs x 5 days 3.5 g 0  . estradiol (ESTRACE) 1 MG tablet Take 1 tablet (1 mg total) by mouth daily. 90 tablet 3  . fluticasone (FLONASE) 50 MCG/ACT nasal spray Place 2 sprays into both nostrils daily. 16 g 2  . fluticasone (FLOVENT HFA) 110 MCG/ACT inhaler Inhale 2 puffs into the lungs 2 (two) times daily. 1 Inhaler 12  . levothyroxine (SYNTHROID, LEVOTHROID) 75 MCG tablet Take 1 tablet (75 mcg total) by mouth daily. 90 tablet 3  . loratadine (CLARITIN) 10 MG tablet Take 1 tablet (10 mg total) by mouth daily. 90 tablet 3  . NITRO-BID 2 % ointment Place 1 application rectally. As needed    .  Plecanatide (TRULANCE) 3 MG TABS Take 3 mg by mouth daily. 21 tablet 0  . potassium chloride (KLOR-CON) 8 MEQ tablet Take 1 tablet (8 mEq total) by mouth daily. (Patient not taking: Reported on 04/30/2018) 90 tablet 3   Facility-Administered Medications Prior to Visit  Medication Dose Route Frequency Provider Last Rate Last Dose  . 0.9 %  sodium chloride infusion  500 mL Intravenous Continuous Danis, Estill Cotta III, MD        ROS: Review of Systems  Constitutional: Negative for activity change, appetite change, chills, fatigue and unexpected weight change.  HENT: Negative for congestion, mouth sores and sinus pressure.   Eyes: Negative for visual disturbance.  Respiratory: Negative for cough and chest tightness.   Cardiovascular: Positive for palpitations. Negative for leg swelling.  Gastrointestinal: Negative for abdominal pain and nausea.  Genitourinary: Negative for difficulty urinating, frequency and vaginal pain.  Musculoskeletal: Negative for back pain and gait problem.  Skin: Negative for pallor and rash.  Neurological: Negative for dizziness, tremors, weakness, numbness and headaches.  Psychiatric/Behavioral: Negative for confusion and sleep disturbance.    Objective:  BP 136/82 (BP Location: Left Arm, Patient Position: Sitting, Cuff Size: Large)   Pulse 74   Temp 98.4 F (36.9 C) (Oral)   Ht 5\' 5"  (1.651 m)   Wt 199  lb (90.3 kg)   SpO2 97%   BMI 33.12 kg/m   BP Readings from Last 3 Encounters:  04/30/18 136/82  03/11/18 126/78  02/24/18 124/82    Wt Readings from Last 3 Encounters:  04/30/18 199 lb (90.3 kg)  03/11/18 203 lb 3.2 oz (92.2 kg)  02/24/18 204 lb (92.5 kg)    Physical Exam Constitutional:      General: She is not in acute distress.    Appearance: She is well-developed.  HENT:     Head: Normocephalic.     Right Ear: External ear normal.     Left Ear: External ear normal.     Nose: Nose normal.  Eyes:     General:        Right eye: No  discharge.        Left eye: No discharge.     Conjunctiva/sclera: Conjunctivae normal.     Pupils: Pupils are equal, round, and reactive to light.  Neck:     Musculoskeletal: Normal range of motion and neck supple.     Thyroid: No thyromegaly.     Vascular: No JVD.     Trachea: No tracheal deviation.  Cardiovascular:     Rate and Rhythm: Normal rate and regular rhythm.     Heart sounds: Normal heart sounds.  Pulmonary:     Effort: No respiratory distress.     Breath sounds: No stridor. No wheezing.  Abdominal:     General: Bowel sounds are normal. There is no distension.     Palpations: Abdomen is soft. There is no mass.     Tenderness: There is no abdominal tenderness. There is no guarding or rebound.  Musculoskeletal:        General: No tenderness.  Lymphadenopathy:     Cervical: No cervical adenopathy.  Skin:    Findings: No erythema or rash.  Neurological:     Cranial Nerves: No cranial nerve deficit.     Motor: No abnormal muscle tone.     Coordination: Coordination normal.     Deep Tendon Reflexes: Reflexes normal.  Psychiatric:        Behavior: Behavior normal.        Thought Content: Thought content normal.        Judgment: Judgment normal.    deaf Regular rate w/irreg beats  Lab Results  Component Value Date   WBC 14.9 (H) 02/02/2018   HGB 14.7 02/02/2018   HCT 43.2 02/02/2018   PLT 188.0 02/02/2018   GLUCOSE 156 (H) 03/17/2018   CHOL 137 03/10/2015   TRIG 194.0 (H) 03/10/2015   HDL 43.10 03/10/2015   LDLCALC 55 03/10/2015   ALT 34 02/02/2018   AST 51 (H) 02/02/2018   NA 138 03/17/2018   K 3.9 03/17/2018   CL 101 03/17/2018   CREATININE 0.75 03/17/2018   BUN 8 03/17/2018   CO2 30 03/17/2018   TSH 1.14 02/02/2018   HGBA1C 6.9 (H) 02/02/2018    US Thyroid  Result Date: 02/04/2018 CLINICAL DATA:  Goiter. EXAM: THYROID ULTRASOUND TECHNIQUE: Ultrasound examination of the thyroid gland and adjacent soft tissues was performed. COMPARISON:  None.  FINDINGS: Parenchymal Echotexture: Normal Isthmus: 0.4 cm Right lobe: 3.2 x 1.0 x 1.2 cm Left lobe: 4.3 x 1.1 x 1.2 cm _________________________________________________________ Estimated total number of nodules >/= 1 cm: 0 Number of spongiform nodules >/=  2 cm not described below (TR1): 0 Number of mixed cystic and solid nodules >/= 1.5 cm not described below ([place]): 0  _________________________________________________________ No discrete nodules are seen within the thyroid gland. IMPRESSION: Normal sonographic appearance of the thyroid gland. Electronically Signed   By: Jacqulynn Cadet M.D.   On: 02/04/2018 17:28    Assessment & Plan:   There are no diagnoses linked to this encounter.   No orders of the defined types were placed in this encounter.    Follow-up: No follow-ups on file.  Walker Kehr, MD

## 2018-04-30 NOTE — Assessment & Plan Note (Signed)
Not on Rx 

## 2018-05-04 ENCOUNTER — Ambulatory Visit: Payer: Self-pay | Admitting: Physical Therapy

## 2018-05-11 ENCOUNTER — Encounter: Payer: Self-pay | Admitting: Physical Therapy

## 2018-05-15 ENCOUNTER — Encounter: Payer: Self-pay | Admitting: Cardiovascular Disease

## 2018-05-15 ENCOUNTER — Ambulatory Visit (INDEPENDENT_AMBULATORY_CARE_PROVIDER_SITE_OTHER): Payer: Medicare Other | Admitting: Cardiovascular Disease

## 2018-05-15 DIAGNOSIS — R002 Palpitations: Secondary | ICD-10-CM

## 2018-05-15 NOTE — Assessment & Plan Note (Signed)
Ms. Waner was referred to me by Dr. Halford Chessman for PVCs.  She is completely asymptomatic from these.  They were found fortuitously on a routine exam on EKG.  She is unaware that she has PVCs.  She does have reactive airways disease on inhaled bronchodilators.  She had a negative Myoview stress test in 2018 point 12/13/2016.  At this point, I do not feel compelled to treat her given the fact that she is asymptomatic and had a normal EF with a negative Myoview.

## 2018-05-15 NOTE — Progress Notes (Signed)
05/15/2018 Dianelly Ferran [id number]   1960/07/08  [id number]  Primary Physician Plotnikov, Evie Lacks, MD Primary Cardiologist: Lorretta Harp MD Garret Reddish, [place], [place]  HPI:  Heidi Good is a 58 y.o. female      05/15/2018 Azlyn Wingler [facility]   1960/12/17  [id number]  Primary Physician Plotnikov, Evie Lacks, MD Primary Cardiologist: Lorretta Harp MD FACP, [place], [place], [place]  HPI:  Heidi Good is a 58 y.o. moderately overweight divorced female with no children referred by Dr. Halford Chessman for cardiovascular dilation because of asymptomatic palpitations.  She is retired from working at the [employer].  She has no cardiac risk factors other than a father who is had stents.  She is never had a heart attack or stroke.  She denies chest pain or shortness of breath.  She had a negative Myoview stress test August 2018.  She does have reactive airways disease as well as anxiety depression.  She is noted to have frequent unifocal PVCs on routine EKG during routine office visit but is asymptomatic from these.   Current Meds  Medication Sig  . albuterol (PROVENTIL HFA;VENTOLIN HFA) 108 (90 Base) MCG/ACT inhaler Inhale 1-2 puffs into the lungs every 6 (six) hours as needed for wheezing or shortness of breath.  Marland Kitchen albuterol (PROVENTIL) (2.5 MG/3ML) 0.083% nebulizer solution Take 3 mLs (2.5 mg total) by nebulization every 6 (six) hours as needed for wheezing or shortness of breath.  . ALPRAZolam (XANAX) 0.5 MG tablet TK 1 T PO QD PRA  . ARIPiprazole (ABILIFY) 15 MG tablet Take 1 tablet (15 mg total) by mouth daily.  . Cholecalciferol (VITAMIN D3) 2000 units capsule Take 1 capsule (2,000 Units total) by mouth daily.  . ergocalciferol (VITAMIN D2) 50000 units capsule Take 1 capsule (50,000 Units total) by mouth once a week.  . estradiol (ESTRACE) 1 MG tablet Take 1 tablet (1 mg total) by mouth daily.  . fluticasone (FLONASE) 50 MCG/ACT nasal spray Place 2 sprays into both nostrils daily.  . fluticasone  (FLOVENT HFA) 110 MCG/ACT inhaler Inhale 2 puffs into the lungs 2 (two) times daily.  Marland Kitchen levothyroxine (SYNTHROID, LEVOTHROID) 75 MCG tablet Take 1 tablet (75 mcg total) by mouth daily.  Marland Kitchen NITRO-BID 2 % ointment Place 1 application rectally. As needed  . Plecanatide (TRULANCE) 3 MG TABS Take 3 mg by mouth daily.   Current Facility-Administered Medications for the 05/15/18 encounter (Office Visit) with Lorretta Harp, MD  Medication  . 0.9 %  sodium chloride infusion     Allergies  Allergen Reactions  . Doxycycline Nausea And Vomiting  . Hydrocodone     Nausea from cough syrup  . Other     Dog, cat, nuts, grass, trees    Social History   Socioeconomic History  . Marital status: Divorced    Spouse name: Not on file  . Number of children: 0  . Years of education: Not on file  . Highest education level: Not on file  Occupational History  . Occupation: disablied    Employer: RETIRED  Social Needs  . Financial resource strain: Not on file  . Food insecurity:    Worry: Not on file    Inability: Not on file  . Transportation needs:    Medical: Not on file    Non-medical: Not on file  Tobacco Use  . Smoking status: Never Smoker  . Smokeless tobacco: Never Used  Substance and Sexual Activity  . Alcohol use: No  .  Drug use: No  . Sexual activity: Not on file  Lifestyle  . Physical activity:    Days per week: Not on file    Minutes per session: Not on file  . Stress: Not on file  Relationships  . Social connections:    Talks on phone: Not on file    Gets together: Not on file    Attends religious service: Not on file    Active member of club or organization: Not on file    Attends meetings of clubs or organizations: Not on file    Relationship status: Not on file  . Intimate partner violence:    Fear of current or ex partner: Not on file    Emotionally abused: Not on file    Physically abused: Not on file    Forced sexual activity: Not on file  Other Topics Concern    . Not on file  Social History Narrative  . Not on file     Review of Systems: General: negative for chills, fever, night sweats or weight changes.  Cardiovascular: negative for chest pain, dyspnea on exertion, edema, orthopnea, palpitations, paroxysmal nocturnal dyspnea or shortness of breath Dermatological: negative for rash Respiratory: negative for cough or wheezing Urologic: negative for hematuria Abdominal: negative for nausea, vomiting, diarrhea, bright red blood per rectum, melena, or hematemesis Neurologic: negative for visual changes, syncope, or dizziness All other systems reviewed and are otherwise negative except as noted above.    Blood pressure (!) 156/80, pulse 88, height 5\' 5"  (1.651 m), weight 199 lb 6.4 oz (90.4 kg).  General appearance: alert and no distress Neck: no adenopathy, no carotid bruit, no JVD, supple, symmetrical, trachea midline and thyroid not enlarged, symmetric, no tenderness/mass/nodules Lungs: clear to auscultation bilaterally Heart: regular rate and rhythm, S1, S2 normal, no murmur, click, rub or gallop Extremities: extremities normal, atraumatic, no cyanosis or edema Pulses: 2+ and symmetric Skin: Skin color, texture, turgor normal. No rashes or lesions Neurologic: Alert and oriented X 3, normal strength and tone. Normal symmetric reflexes. Normal coordination and gait  EKG sinus rhythm at 88 with occasional PVCs and nonspecific ST and T wave changes.  Personally reviewed this EKG.  ASSESSMENT AND PLAN:   Palpitations [name] was referred to me by Dr. Halford Chessman for PVCs.  She is completely asymptomatic from these.  They were found fortuitously on a routine exam on EKG.  She is unaware that she has PVCs.  She does have reactive airways disease on inhaled bronchodilators.  She had a negative Myoview stress test in 2018 point 12/13/2016.  At this point, I do not feel compelled to treat her given the fact that she is asymptomatic and had a normal EF  with a negative Myoview.      Lorretta Harp MD FACP,FACC,FAHA, [facility] 05/15/2018 2:21 PM   Current Meds  Medication Sig  . albuterol (PROVENTIL HFA;VENTOLIN HFA) 108 (90 Base) MCG/ACT inhaler Inhale 1-2 puffs into the lungs every 6 (six) hours as needed for wheezing or shortness of breath.  Marland Kitchen albuterol (PROVENTIL) (2.5 MG/3ML) 0.083% nebulizer solution Take 3 mLs (2.5 mg total) by nebulization every 6 (six) hours as needed for wheezing or shortness of breath.  . ALPRAZolam (XANAX) 0.5 MG tablet TK 1 T PO QD PRA  . ARIPiprazole (ABILIFY) 15 MG tablet Take 1 tablet (15 mg total) by mouth daily.  . Cholecalciferol (VITAMIN D3) 2000 units capsule Take 1 capsule (2,000 Units total) by mouth daily.  . ergocalciferol (VITAMIN  D2) 50000 units capsule Take 1 capsule (50,000 Units total) by mouth once a week.  . estradiol (ESTRACE) 1 MG tablet Take 1 tablet (1 mg total) by mouth daily.  . fluticasone (FLONASE) 50 MCG/ACT nasal spray Place 2 sprays into both nostrils daily.  . fluticasone (FLOVENT HFA) 110 MCG/ACT inhaler Inhale 2 puffs into the lungs 2 (two) times daily.  Marland Kitchen levothyroxine (SYNTHROID, LEVOTHROID) 75 MCG tablet Take 1 tablet (75 mcg total) by mouth daily.  Marland Kitchen NITRO-BID 2 % ointment Place 1 application rectally. As needed  . Plecanatide (TRULANCE) 3 MG TABS Take 3 mg by mouth daily.   Current Facility-Administered Medications for the 05/15/18 encounter (Office Visit) with Lorretta Harp, MD  Medication  . 0.9 %  sodium chloride infusion     Allergies  Allergen Reactions  . Doxycycline Nausea And Vomiting  . Hydrocodone     Nausea from cough syrup  . Other     Dog, cat, nuts, grass, trees    Social History   Socioeconomic History  . Marital status: Divorced    Spouse name: Not on file  . Number of children: 0  . Years of education: Not on file  . Highest education level: Not on file  Occupational History  . Occupation: disablied    Employer: RETIRED  Social Needs   . Financial resource strain: Not on file  . Food insecurity:    Worry: Not on file    Inability: Not on file  . Transportation needs:    Medical: Not on file    Non-medical: Not on file  Tobacco Use  . Smoking status: Never Smoker  . Smokeless tobacco: Never Used  Substance and Sexual Activity  . Alcohol use: No  . Drug use: No  . Sexual activity: Not on file  Lifestyle  . Physical activity:    Days per week: Not on file    Minutes per session: Not on file  . Stress: Not on file  Relationships  . Social connections:    Talks on phone: Not on file    Gets together: Not on file    Attends religious service: Not on file    Active member of club or organization: Not on file    Attends meetings of clubs or organizations: Not on file    Relationship status: Not on file  . Intimate partner violence:    Fear of current or ex partner: Not on file    Emotionally abused: Not on file    Physically abused: Not on file    Forced sexual activity: Not on file  Other Topics Concern  . Not on file  Social History Narrative  . Not on file     Review of Systems: General: negative for chills, fever, night sweats or weight changes.  Cardiovascular: negative for chest pain, dyspnea on exertion, edema, orthopnea, palpitations, paroxysmal nocturnal dyspnea or shortness of breath Dermatological: negative for rash Respiratory: negative for cough or wheezing Urologic: negative for hematuria Abdominal: negative for nausea, vomiting, diarrhea, bright red blood per rectum, melena, or hematemesis Neurologic: negative for visual changes, syncope, or dizziness All other systems reviewed and are otherwise negative except as noted above.    Blood pressure (!) 156/80, pulse 88, height 5\' 5"  (1.651 m), weight 199 lb 6.4 oz (90.4 kg).  General appearance: alert and no distress Neck: no adenopathy, no carotid bruit, no JVD, supple, symmetrical, trachea midline and thyroid not enlarged, symmetric, no  tenderness/mass/nodules Lungs: clear to auscultation  bilaterally Heart: regular rate and rhythm, S1, S2 normal, no murmur, click, rub or gallop Extremities: extremities normal, atraumatic, no cyanosis or edema Pulses: 2+ and symmetric Skin: Skin color, texture, turgor normal. No rashes or lesions Neurologic: Alert and oriented X 3, normal strength and tone. Normal symmetric reflexes. Normal coordination and gait  EKG normal sinus rhythm at 88 with nonspecific ST and T wave changes.  I Personally reviewed this EKG.  ASSESSMENT AND PLAN:   Palpitations Ms. Baez was referred to me by Dr. Halford Chessman for PVCs.  She is completely asymptomatic from these.  They were found fortuitously on a routine exam on EKG.  She is unaware that she has PVCs.  She does have reactive airways disease on inhaled bronchodilators.  She had a negative Myoview stress test in 2018 point 12/13/2016.  At this point, I do not feel compelled to treat her given the fact that she is asymptomatic and had a normal EF with a negative Myoview.      Lorretta Harp MD FACP,FACC,FAHA, [facility] 05/15/2018 2:21 PM

## 2018-05-15 NOTE — Patient Instructions (Signed)
Medication Instructions:  NO CHANGE If you need a refill on your cardiac medications before your next appointment, please call your pharmacy.   Lab work: NONE If you have labs (blood work) drawn today and your tests are completely normal, you will receive your results only by: Marland Kitchen MyChart Message (if you have MyChart) OR . A paper copy in the mail If you have any lab test that is abnormal or we need to change your treatment, we will call you to review the results.  Testing/Procedures: NONE  Follow-Up: At [facility], you and your health needs are our priority.  As part of our continuing mission to provide you with exceptional heart care, we have created designated Provider Care Teams.  These Care Teams include your primary Cardiologist (physician) and Advanced Practice Providers (APPs -  Physician Assistants and Nurse Practitioners) who all work together to provide you with the care you need, when you need it. . You may schedule a follow up appointment AS NEEDED.  You may see Dr. Gwenlyn Found or one of the following Advanced Practice Providers on your designated Care Team:   . Kerin Ransom, [place] . Almyra Deforest, PA-C . Fabian Sharp, PA-C . Jory Sims, DNP . Rosaria Ferries, PA-C . Roby Lofts, PA-C . Sande Rives, PA-C

## 2018-05-15 NOTE — Addendum Note (Signed)
Addended by: Jacqulynn Cadet on: 05/15/2018 02:29 PM   Modules accepted: Orders

## 2018-05-27 ENCOUNTER — Ambulatory Visit
Admission: RE | Admit: 2018-05-27 | Discharge: 2018-05-27 | Disposition: A | Discharge status: Home / Self Care | Payer: Medicare Other | Source: Ambulatory Visit | Attending: Internal Medicine | Admitting: Internal Medicine

## 2018-05-27 DIAGNOSIS — Z1231 Encounter for screening mammogram for malignant neoplasm of breast: Secondary | ICD-10-CM

## 2018-06-15 ENCOUNTER — Telehealth: Payer: Self-pay | Admitting: Internal Medicine

## 2018-06-15 NOTE — Telephone Encounter (Signed)
Attempted to call patient to schedule AWV. Patient's phone was busy will try to call back at a later time. SF

## 2018-06-16 ENCOUNTER — Ambulatory Visit (INDEPENDENT_AMBULATORY_CARE_PROVIDER_SITE_OTHER): Payer: Medicare Other | Admitting: Internal Medicine

## 2018-06-16 ENCOUNTER — Telehealth: Payer: Self-pay | Admitting: Internal Medicine

## 2018-06-16 ENCOUNTER — Encounter: Payer: Self-pay | Admitting: Internal Medicine

## 2018-06-16 VITALS — BP 124/80 | HR 66 | Temp 98.6°F | Ht 65.0 in | Wt 201.0 lb

## 2018-06-16 DIAGNOSIS — J3081 Allergic rhinitis due to animal (cat) (dog) hair and dander: Secondary | ICD-10-CM | POA: Diagnosis not present

## 2018-06-16 DIAGNOSIS — H01002 Unspecified blepharitis right lower eyelid: Secondary | ICD-10-CM

## 2018-06-16 DIAGNOSIS — R7303 Prediabetes: Secondary | ICD-10-CM | POA: Diagnosis not present

## 2018-06-16 DIAGNOSIS — H01003 Unspecified blepharitis right eye, unspecified eyelid: Secondary | ICD-10-CM | POA: Insufficient documentation

## 2018-06-16 MED ORDER — ERYTHROMYCIN 5 MG/GM OP OINT: 1.0000 "application " | TOPICAL_OINTMENT | Freq: Every day | OPHTHALMIC | 0 refills | Status: DC

## 2018-06-16 MED ORDER — CEPHALEXIN 500 MG PO CAPS: 500.0000 mg | ORAL_CAPSULE | Freq: Three times a day (TID) | ORAL | 0 refills | Status: AC

## 2018-06-16 NOTE — Telephone Encounter (Signed)
Ok w/me Thanks, AP

## 2018-06-16 NOTE — Progress Notes (Signed)
Subjective:    Patient ID: Heidi Good, female    DOB: [birth year], 58 y.o.   MRN: [id number]  HPI  Here with interpretor, c/o right lower medial eyelid stye present for several days then worsening pain, red, swelling to the whole right lower eyelid, without HA, chills, high fever, vision change, sinus congestion, or drainage.  Has similar problem with the left lower eyelid last yr, requiring antibx  Pt denies chest pain, increased sob or doe, wheezing, orthopnea, PND, increased LE swelling, palpitations, dizziness or syncope.   Pt denies polydipsia, polyuria, Past Medical History:  Diagnosis Date  . Anxiety   . Constipation   . Deafness   . Depression   . Diabetes mellitus ([place])   . Gastroparesis   . Hyperlipemia   . Hypothyroidism    Past Surgical History:  Procedure Laterality Date  . ABDOMINAL HYSTERECTOMY    . ANAL RECTAL MANOMETRY N/A 03/07/2017   Procedure: ANO RECTAL MANOMETRY;  Surgeon: Mauri Pole, MD;  Location: [facility];  Service: Endoscopy;  Laterality: N/A;    reports that she has never smoked. She has never used smokeless tobacco. She reports that she does not drink alcohol or use drugs. family history includes Colon polyps in her mother; Diabetes in her mother; Emphysema in her father and paternal grandfather; Heart attack in her father and maternal grandfather; Heart disease in her father; Liver cancer in her maternal grandmother; Multiple myeloma in her father. Allergies  Allergen Reactions  . Doxycycline Nausea And Vomiting  . Hydrocodone     Nausea from cough syrup  . Other     Dog, cat, nuts, grass, trees   Current Outpatient Medications on File Prior to Visit  Medication Sig Dispense Refill  . albuterol (PROVENTIL HFA;VENTOLIN HFA) 108 (90 Base) MCG/ACT inhaler Inhale 1-2 puffs into the lungs every 6 (six) hours as needed for wheezing or shortness of breath. 1 Inhaler 5  . albuterol (PROVENTIL) (2.5 MG/3ML) 0.083% nebulizer solution Take 3 mLs  (2.5 mg total) by nebulization every 6 (six) hours as needed for wheezing or shortness of breath. 75 mL 5  . ALPRAZolam (XANAX) 0.5 MG tablet TK 1 T PO QD PRA  0  . ARIPiprazole (ABILIFY) 15 MG tablet Take 1 tablet (15 mg total) by mouth daily. 30 tablet 3  . Cholecalciferol (VITAMIN D3) 2000 units capsule Take 1 capsule (2,000 Units total) by mouth daily. 100 capsule 3  . diclofenac (VOLTAREN) 75 MG EC tablet Take 1 tablet (75 mg total) by mouth 2 (two) times daily. 30 tablet 0  . ergocalciferol (VITAMIN D2) 50000 units capsule Take 1 capsule (50,000 Units total) by mouth once a week. 6 capsule 0  . estradiol (ESTRACE) 1 MG tablet Take 1 tablet (1 mg total) by mouth daily. 90 tablet 3  . fluticasone (FLONASE) 50 MCG/ACT nasal spray Place 2 sprays into both nostrils daily. 16 g 2  . fluticasone (FLOVENT HFA) 110 MCG/ACT inhaler Inhale 2 puffs into the lungs 2 (two) times daily. 1 Inhaler 12  . levothyroxine (SYNTHROID, LEVOTHROID) 75 MCG tablet Take 1 tablet (75 mcg total) by mouth daily. 90 tablet 3  . loratadine (CLARITIN) 10 MG tablet Take 1 tablet (10 mg total) by mouth daily. 90 tablet 3  . NITRO-BID 2 % ointment Place 1 application rectally. As needed    . Plecanatide (TRULANCE) 3 MG TABS Take 3 mg by mouth daily. 21 tablet 0   No current facility-administered medications on file prior to  visit.    Review of Systems  Constitutional: Negative for other unusual diaphoresis or sweats HENT: Negative for ear discharge or swelling Eyes: Negative for other worsening visual disturbances Respiratory: Negative for stridor or other swelling  Gastrointestinal: Negative for worsening distension or other blood Genitourinary: Negative for retention or other urinary change Musculoskeletal: Negative for other MSK pain or swelling Skin: Negative for color change or other new lesions Neurological: Negative for worsening tremors and other numbness  Psychiatric/Behavioral: Negative for worsening agitation  or other fatigue All other system neg per pt    Objective:   Physical Exam BP 124/80   Pulse 66   Temp 98.6 F (37 C) (Oral)   Ht _0  (1.651 m)   Wt 201 lb (91.2 kg)   SpO2 97%   BMI 33.45 kg/m  VS noted, not ill appearing Constitutional: Pt appears in NAD HENT: Head: NCAT.  Right Ear: External ear normal.  Left Ear: External ear normal.  Eyes: . Pupils are equal, round, and reactive to light. Conjunctivae and EOM are normal Nose: without d/c or deformity except for right lower eyelid 1-2+ red, tender, swelling without abscess or drainage Neck: Neck supple. Gross normal ROM Cardiovascular: Normal rate and regular rhythm.   Pulmonary/Chest: Effort normal and breath sounds without rales or wheezing.  Neurological: Pt is alert. At baseline orientation, motor grossly intact Skin: Skin is warm. No rashes, other new lesions, no LE edema Psychiatric: Pt behavior is normal without agitation  No other exam findings Lab Results  Component Value Date   WBC 14.9 (H) 02/02/2018   HGB 14.7 02/02/2018   HCT 43.2 02/02/2018   PLT 188.0 02/02/2018   GLUCOSE 156 (H) 03/17/2018   CHOL 137 03/10/2015   TRIG 194.0 (H) 03/10/2015   HDL 43.10 03/10/2015   LDLCALC 55 03/10/2015   ALT 34 02/02/2018   AST 51 (H) 02/02/2018   NA 138 03/17/2018   K 3.9 03/17/2018   CL 101 03/17/2018   CREATININE 0.75 03/17/2018   BUN 8 03/17/2018   CO2 30 03/17/2018   TSH 1.14 02/02/2018   HGBA1C 6.9 (H) 02/02/2018       Assessment & Plan:

## 2018-06-16 NOTE — Telephone Encounter (Signed)
Patient was in the office today and asked to transfer care from Dr Alain Marion to Dr Quay Burow. She would like a female provider and said that she was an established patient of Dr Quay Burow' in the past.  Dr. Alain Marion, is this okay with you? Dr. Quay Burow, is this okay with you?

## 2018-06-16 NOTE — Assessment & Plan Note (Signed)
Mild to mod, for antibx course and emycin opth ointment,  to f/u any worsening symptoms or concerns

## 2018-06-16 NOTE — Patient Instructions (Signed)
Please take all new medication as prescribed - the pill and the eye ointment antibiotic as well  Please continue all other medications as before, and refills have been done if requested.  Please have the pharmacy call with any other refills you may need.  Please keep your appointments with your specialists as you may have planned

## 2018-06-16 NOTE — Assessment & Plan Note (Signed)
stable overall by history and exam, recent data reviewed with pt, and pt to continue medical treatment as before,  to f/u any worsening symptoms or concerns  

## 2018-06-17 ENCOUNTER — Encounter: Payer: Self-pay | Admitting: Psychiatry

## 2018-06-17 ENCOUNTER — Ambulatory Visit (INDEPENDENT_AMBULATORY_CARE_PROVIDER_SITE_OTHER): Payer: Medicare Other | Admitting: Psychiatry

## 2018-06-17 DIAGNOSIS — F5105 Insomnia due to other mental disorder: Secondary | ICD-10-CM

## 2018-06-17 DIAGNOSIS — F99 Mental disorder, not otherwise specified: Secondary | ICD-10-CM

## 2018-06-17 DIAGNOSIS — F200 Paranoid schizophrenia: Secondary | ICD-10-CM | POA: Diagnosis not present

## 2018-06-17 NOTE — Telephone Encounter (Signed)
I am sorry but I am not current accepting new patients - can try [facility] or Mickel Baas

## 2018-06-17 NOTE — Progress Notes (Signed)
Heidi Good [id number] 12-24-1960 58 y.o.  Subjective:   Patient ID:  Heidi Good is a 58 y.o. (DOB March 10, 1961) female.  Chief Complaint: Last seen March 26, 2018 Chief Complaint  Patient presents with  . Follow-up    Medication Management    HPI patient is deaf and seen with an interpreter.  She has notes. Heidi Good presents to the office today for follow-up of schizophrenia.  She's here for FU after being back on Abilify 15 daily.  In November bothered bc could see something and feel something walk by outside and dog acted funny.  Then brother came over and checked things.  She felt someone was walking around outside.  Says this happened 7 times by mid DEC.  She feels in danger from this incidents.  Never saw anyone but believed there was someone there.  Police came by and didn't find anything.  Thinks it's an old friend.    Patient reports stable mood and denies depressed or irritable moods.  Patient denies any recent difficulty with anxiety except as noted.  Patient denies difficulty with sleep initiation or maintenance. Denies appetite disturbance.  Patient reports that energy and motivation have been good.  Patient denies any difficulty with concentration.  Patient denies any suicidal ideation.  15 mg Abilify helped her feel better, calmer than did the 10 mg Abilify.  No SE and claims compliance.  Denies voices, depression, anger on this dosage.  Sleep good 8 hours.  No drowsiness. Watches news.  Other psychiatric medications tried include perphenazine which she did not like, loxapine which she did not like, and Saphris.  She complained that it gave her strange feeling under the tongue.  She has taken other psychiatric medications in the past but she does not know the names of them and we have not been able to get the records.  Review of Systems:  Review of Systems  Respiratory: Negative for cough and shortness of breath.   Neurological: Negative for tremors and  weakness.  Psychiatric/Behavioral: Negative for agitation, behavioral problems, confusion, decreased concentration, dysphoric mood, hallucinations, self-injury, sleep disturbance and suicidal ideas. The patient is nervous/anxious. The patient is not hyperactive.     Medications: I have reviewed the patient's current medications.  Current Outpatient Medications  Medication Sig Dispense Refill  . albuterol (PROVENTIL HFA;VENTOLIN HFA) 108 (90 Base) MCG/ACT inhaler Inhale 1-2 puffs into the lungs every 6 (six) hours as needed for wheezing or shortness of breath. 1 Inhaler 5  . albuterol (PROVENTIL) (2.5 MG/3ML) 0.083% nebulizer solution Take 3 mLs (2.5 mg total) by nebulization every 6 (six) hours as needed for wheezing or shortness of breath. 75 mL 5  . ALPRAZolam (XANAX) 0.5 MG tablet TK 1 T PO QD PRA  0  . ARIPiprazole (ABILIFY) 15 MG tablet Take 1 tablet (15 mg total) by mouth daily. 30 tablet 3  . cephALEXin (KEFLEX) 500 MG capsule Take 1 capsule (500 mg total) by mouth 3 (three) times daily for 10 days. 30 capsule 0  . Cholecalciferol (VITAMIN D3) 2000 units capsule Take 1 capsule (2,000 Units total) by mouth daily. 100 capsule 3  . ergocalciferol (VITAMIN D2) 50000 units capsule Take 1 capsule (50,000 Units total) by mouth once a week. 6 capsule 0  . erythromycin ophthalmic ointment Place 1 application into the left eye at bedtime. L eye qhs x 5 days 3.5 g 0  . estradiol (ESTRACE) 1 MG tablet Take 1 tablet (1 mg total) by mouth daily. [place]  tablet 3  . fluticasone (FLONASE) 50 MCG/ACT nasal spray Place 2 sprays into both nostrils daily. 16 g 2  . fluticasone (FLOVENT HFA) 110 MCG/ACT inhaler Inhale 2 puffs into the lungs 2 (two) times daily. 1 Inhaler 12  . levothyroxine (SYNTHROID, LEVOTHROID) 75 MCG tablet Take 1 tablet (75 mcg total) by mouth daily. 90 tablet 3  . loratadine (CLARITIN) 10 MG tablet Take 1 tablet (10 mg total) by mouth daily. 90 tablet 3  . NITRO-BID 2 % ointment Place 1  application rectally. As needed    . diclofenac (VOLTAREN) 75 MG EC tablet Take 1 tablet (75 mg total) by mouth 2 (two) times daily. (Patient not taking: Reported on 06/17/2018) 30 tablet 0  . Plecanatide (TRULANCE) 3 MG TABS Take 3 mg by mouth daily. (Patient not taking: Reported on 06/17/2018) 21 tablet 0   No current facility-administered medications for this visit.     Medication Side Effects: None  Allergies:  Allergies  Allergen Reactions  . Doxycycline Nausea And Vomiting  . Hydrocodone     Nausea from cough syrup  . Other     Dog, cat, nuts, grass, trees    Past Medical History:  Diagnosis Date  . Anxiety   . Constipation   . Deafness   . Depression   . Diabetes mellitus ([place])   . Gastroparesis   . Hyperlipemia   . Hypothyroidism     Family History  Problem Relation Age of Onset  . Diabetes Mother   . Colon polyps Mother        over 150 polyps detected  . Heart disease Father   . Multiple myeloma Father   . Emphysema Father   . Heart attack Father   . Liver cancer Maternal Grandmother        not primary source, spread to her liver  . Heart attack Maternal Grandfather   . Emphysema Paternal Grandfather   . Colon cancer Neg Hx   . Esophageal cancer Neg Hx   . Rectal cancer Neg Hx   . Stomach cancer Neg Hx     Social History   Socioeconomic History  . Marital status: Divorced    Spouse name: Not on file  . Number of children: 0  . Years of education: Not on file  . Highest education level: Not on file  Occupational History  . Occupation: disablied    Employer: RETIRED  Social Needs  . Financial resource strain: Not on file  . Food insecurity:    Worry: Not on file    Inability: Not on file  . Transportation needs:    Medical: Not on file    Non-medical: Not on file  Tobacco Use  . Smoking status: Never Smoker  . Smokeless tobacco: Never Used  Substance and Sexual Activity  . Alcohol use: No  . Drug use: No  . Sexual activity: Not on file   Lifestyle  . Physical activity:    Days per week: Not on file    Minutes per session: Not on file  . Stress: Not on file  Relationships  . Social connections:    Talks on phone: Not on file    Gets together: Not on file    Attends religious service: Not on file    Active member of club or organization: Not on file    Attends meetings of clubs or organizations: Not on file    Relationship status: Not on file  . Intimate partner violence:  Fear of current or ex partner: Not on file    Emotionally abused: Not on file    Physically abused: Not on file    Forced sexual activity: Not on file  Other Topics Concern  . Not on file  Social History Narrative  . Not on file   She drives.  Still lives with her neighbor.  Past Medical History, Surgical history, Social history, and Family history were reviewed and updated as appropriate.   Please see review of systems for further details on the patient's review from today.   Objective:   Physical Exam:  There were no vitals taken for this visit.  Physical Exam Constitutional:      General: She is not in acute distress.    Appearance: She is well-developed.  Musculoskeletal:        General: No deformity.  Neurological:     Mental Status: She is alert and oriented to person, place, and time.     Motor: No tremor.     Coordination: Coordination normal.     Gait: Gait normal.  Psychiatric:        Attention and Perception: She is attentive. She does not perceive auditory hallucinations.        Mood and Affect: Mood is not anxious or depressed. Affect is not labile, blunt, angry or inappropriate.        Speech: Speech normal.        Behavior: Behavior normal.        Thought Content: Thought content is paranoid. Thought content does not include homicidal or suicidal ideation. Thought content does not include homicidal or suicidal plan.        Cognition and Memory: Cognition normal.        Judgment: Judgment normal.     Comments:  Insight poor.  No auditory or visual hallucinations. No delusions.      Lab Review:     Component Value Date/Time   NA 138 03/17/2018 0820   K 3.9 03/17/2018 0820   CL 101 03/17/2018 0820   CO2 30 03/17/2018 0820   GLUCOSE 156 (H) 03/17/2018 0820   BUN 8 03/17/2018 0820   CREATININE 0.75 03/17/2018 0820   CALCIUM 9.1 03/17/2018 0820   PROT 7.5 02/02/2018 1619   ALBUMIN 3.6 02/02/2018 1619   AST 51 (H) 02/02/2018 1619   ALT 34 02/02/2018 1619   ALKPHOS 117 02/02/2018 1619   BILITOT 0.7 02/02/2018 1619   GFRNONAA 95 07/11/2008 1616   GFRAA 115 07/11/2008 1616       Component Value Date/Time   WBC 14.9 (H) 02/02/2018 1619   RBC 4.78 02/02/2018 1619   HGB 14.7 02/02/2018 1619   HCT 43.2 02/02/2018 1619   PLT 188.0 02/02/2018 1619   MCV 90.3 02/02/2018 1619   MCHC 34.0 02/02/2018 1619   RDW 13.1 02/02/2018 1619   LYMPHSABS 4.7 (H) 02/02/2018 1619   MONOABS 1.1 (H) 02/02/2018 1619   EOSABS 0.5 02/02/2018 1619   BASOSABS 0.1 02/02/2018 1619    No results found for: POCLITH, LITHIUM   No results found for: PHENYTOIN, PHENOBARB, VALPROATE, CBMZ   .res Assessment: Plan:    Paranoid schizophrenia ([place])  Insomnia due to other mental disorder Greater than 50% of face to face time with patient and an interpretor for 30 mins was spent on counseling and coordination of care. We discussed potential metabolic side effects associated with atypical antipsychotics, as well as potential risk for movement side effects. Advised pt to contact office  if movement side effects occur.   Pt has had a relapse of paranoid psychosis. Offered option of increasing the Abilify.  Refuses increases.  Watches TV, cares for mother, no anger problems.  She is not agitated nor hostile though she is paranoid.  She denies being on a great deal of fear but the situation does make her nervous.  She is able to function normally.  She is agreed to continue Abilify 15 mg which she has taken for many years in  the past to good effect.  She had a brief period of time where she was convinced it was not the right medicine for her.  We were not able to find any other medications that she felt satisfied taking.  It does not not appear that she has active psychosis at the moment.  She has a history of repeated paranoid psychotic episodes.  During some of these she could become agitated and aggressive with her mother.  She agrees that this is a good medicine for her  Her labs are followed by her primary care doctor.  This appointment was 30 minutes  Follow-up 3 months.  She was encouraged to call if she decided to increase the medication before the next appointment.  Lynder Parents MD, DFAPA  Please see After Visit Summary for patient specific instructions.  Future Appointments  Date Time Provider [place]  07/29/2018  2:00 PM Plotnikov, Evie Lacks, MD [id number]  09/16/2018  1:00 PM Cottle, Billey Co., MD [facility] None    No orders of the defined types were placed in this encounter.     -------------------------------

## 2018-06-18 NOTE — Telephone Encounter (Signed)
Tried to call patient. No answer and unable to leave a message.

## 2018-06-30 ENCOUNTER — Ambulatory Visit (INDEPENDENT_AMBULATORY_CARE_PROVIDER_SITE_OTHER): Payer: Medicare Other | Admitting: Internal Medicine

## 2018-06-30 ENCOUNTER — Encounter: Payer: Self-pay | Admitting: Internal Medicine

## 2018-06-30 DIAGNOSIS — H00015 Hordeolum externum left lower eyelid: Secondary | ICD-10-CM

## 2018-06-30 NOTE — Assessment & Plan Note (Addendum)
Finish Keflex  Use erythro oint tid Rice sock See your ophthalmologist - Dr Katy Fitch

## 2018-06-30 NOTE — Progress Notes (Signed)
Subjective:  Patient ID: Heidi Good, female    DOB: 1960-05-23  Age: 58 y.o. MRN: [id number]  CC: No chief complaint on file.   HPI [name] MPNTIRW presents for a stye L eye - better  Outpatient Medications Prior to Visit  Medication Sig Dispense Refill  . albuterol (PROVENTIL HFA;VENTOLIN HFA) 108 (90 Base) MCG/ACT inhaler Inhale 1-2 puffs into the lungs every 6 (six) hours as needed for wheezing or shortness of breath. 1 Inhaler 5  . albuterol (PROVENTIL) (2.5 MG/3ML) 0.083% nebulizer solution Take 3 mLs (2.5 mg total) by nebulization every 6 (six) hours as needed for wheezing or shortness of breath. 75 mL 5  . ALPRAZolam (XANAX) 0.5 MG tablet TK 1 T PO QD PRA  0  . ARIPiprazole (ABILIFY) 15 MG tablet Take 1 tablet (15 mg total) by mouth daily. 30 tablet 3  . Cholecalciferol (VITAMIN D3) 2000 units capsule Take 1 capsule (2,000 Units total) by mouth daily. 100 capsule 3  . diclofenac (VOLTAREN) 75 MG EC tablet Take 1 tablet (75 mg total) by mouth 2 (two) times daily. 30 tablet 0  . ergocalciferol (VITAMIN D2) 50000 units capsule Take 1 capsule (50,000 Units total) by mouth once a week. 6 capsule 0  . erythromycin ophthalmic ointment Place 1 application into the left eye at bedtime. L eye qhs x 5 days 3.5 g 0  . estradiol (ESTRACE) 1 MG tablet Take 1 tablet (1 mg total) by mouth daily. 90 tablet 3  . fluticasone (FLONASE) 50 MCG/ACT nasal spray Place 2 sprays into both nostrils daily. 16 g 2  . fluticasone (FLOVENT HFA) 110 MCG/ACT inhaler Inhale 2 puffs into the lungs 2 (two) times daily. 1 Inhaler 12  . levothyroxine (SYNTHROID, LEVOTHROID) 75 MCG tablet Take 1 tablet (75 mcg total) by mouth daily. 90 tablet 3  . loratadine (CLARITIN) 10 MG tablet Take 1 tablet (10 mg total) by mouth daily. 90 tablet 3  . NITRO-BID 2 % ointment Place 1 application rectally. As needed    . Plecanatide (TRULANCE) 3 MG TABS Take 3 mg by mouth daily. 21 tablet 0   No facility-administered medications  prior to visit.     ROS: Review of Systems  Constitutional: Negative for activity change, appetite change, chills, fatigue and unexpected weight change.  HENT: Positive for hearing loss. Negative for congestion, mouth sores and sinus pressure.   Eyes: Positive for pain. Negative for discharge, redness, itching and visual disturbance.  Respiratory: Negative for cough and chest tightness.   Gastrointestinal: Negative for abdominal pain and nausea.  Genitourinary: Negative for difficulty urinating, frequency and vaginal pain.  Musculoskeletal: Negative for back pain and gait problem.  Skin: Negative for pallor and rash.  Neurological: Negative for dizziness, tremors, weakness, numbness and headaches.  Psychiatric/Behavioral: Negative for confusion and sleep disturbance.    Objective:  BP 126/78 (BP Location: Left Arm, Patient Position: Sitting, Cuff Size: Large)   Pulse 67   Temp 97.9 F (36.6 C) (Oral)   Ht 5\' 5"  (1.651 m)   Wt 203 lb (92.1 kg)   SpO2 97%   BMI 33.78 kg/m   BP Readings from Last 3 Encounters:  06/30/18 126/78  06/16/18 124/80  05/15/18 (!) 156/80    Wt Readings from Last 3 Encounters:  06/30/18 203 lb (92.1 kg)  06/16/18 201 lb (91.2 kg)  05/15/18 199 lb 6.4 oz (90.4 kg)    Physical Exam Constitutional:      General: She is not in  acute distress.    Appearance: She is well-developed.  HENT:     Head: Normocephalic.     Right Ear: External ear normal.     Left Ear: External ear normal.     Nose: Nose normal.  Eyes:     General:        Right eye: No discharge.        Left eye: No discharge.     Conjunctiva/sclera: Conjunctivae normal.     Pupils: Pupils are equal, round, and reactive to light.  Neck:     Musculoskeletal: Normal range of motion and neck supple.     Thyroid: No thyromegaly.     Vascular: No JVD.     Trachea: No tracheal deviation.  Cardiovascular:     Rate and Rhythm: Normal rate and regular rhythm.     Heart sounds: Normal  heart sounds.  Pulmonary:     Effort: No respiratory distress.     Breath sounds: No stridor. No wheezing.  Abdominal:     General: Bowel sounds are normal. There is no distension.     Palpations: Abdomen is soft. There is no mass.     Tenderness: There is no abdominal tenderness. There is no guarding or rebound.  Musculoskeletal:        General: No tenderness.  Lymphadenopathy:     Cervical: No cervical adenopathy.  Skin:    Findings: No erythema or rash.  Neurological:     Cranial Nerves: No cranial nerve deficit.     Motor: No abnormal muscle tone.     Coordination: Coordination normal.     Deep Tendon Reflexes: Reflexes normal.  Psychiatric:        Behavior: Behavior normal.        Thought Content: Thought content normal.        Judgment: Judgment normal.    L lower lid stye  Lab Results  Component Value Date   WBC 14.9 (H) 02/02/2018   HGB 14.7 02/02/2018   HCT 43.2 02/02/2018   PLT 188.0 02/02/2018   GLUCOSE 156 (H) 03/17/2018   CHOL 137 03/10/2015   TRIG 194.0 (H) 03/10/2015   HDL 43.10 03/10/2015   LDLCALC 55 03/10/2015   ALT 34 02/02/2018   AST 51 (H) 02/02/2018   NA 138 03/17/2018   K 3.9 03/17/2018   CL 101 03/17/2018   CREATININE 0.75 03/17/2018   BUN 8 03/17/2018   CO2 30 03/17/2018   TSH 1.14 02/02/2018   HGBA1C 6.9 (H) 02/02/2018    Mm 3d Screen Breast Bilateral  Result Date: 05/27/2018 CLINICAL DATA:  Screening. EXAM: DIGITAL SCREENING BILATERAL MAMMOGRAM WITH TOMO AND CAD COMPARISON:  Previous exam(s). ACR Breast Density Category b: There are scattered areas of fibroglandular density. FINDINGS: There are no findings suspicious for malignancy. Images were processed with CAD. IMPRESSION: No mammographic evidence of malignancy. A result letter of this screening mammogram will be mailed directly to the patient. RECOMMENDATION: Screening mammogram in one year. (Code:SM-B-01Y) BI-RADS CATEGORY  1: Negative. Electronically Signed   By: Curlene Dolphin M.D.    On: 05/27/2018 16:42    Assessment & Plan:   Diagnoses and all orders for this visit:  Hordeolum externum of left lower eyelid -     Ambulatory referral to Ophthalmology     No orders of the defined types were placed in this encounter.    Follow-up: No follow-ups on file.  Walker Kehr, MD

## 2018-07-01 DIAGNOSIS — H0102B Squamous blepharitis left eye, upper and lower eyelids: Secondary | ICD-10-CM | POA: Diagnosis not present

## 2018-07-01 DIAGNOSIS — H0102A Squamous blepharitis right eye, upper and lower eyelids: Secondary | ICD-10-CM | POA: Diagnosis not present

## 2018-07-01 DIAGNOSIS — H0015 Chalazion left lower eyelid: Secondary | ICD-10-CM | POA: Diagnosis not present

## 2018-07-01 DIAGNOSIS — H0012 Chalazion right lower eyelid: Secondary | ICD-10-CM | POA: Diagnosis not present

## 2018-07-22 DIAGNOSIS — H0102A Squamous blepharitis right eye, upper and lower eyelids: Secondary | ICD-10-CM | POA: Diagnosis not present

## 2018-07-22 DIAGNOSIS — H0012 Chalazion right lower eyelid: Secondary | ICD-10-CM | POA: Diagnosis not present

## 2018-07-22 DIAGNOSIS — H0015 Chalazion left lower eyelid: Secondary | ICD-10-CM | POA: Diagnosis not present

## 2018-07-22 DIAGNOSIS — H0102B Squamous blepharitis left eye, upper and lower eyelids: Secondary | ICD-10-CM | POA: Diagnosis not present

## 2018-07-24 DIAGNOSIS — M79671 Pain in right foot: Secondary | ICD-10-CM | POA: Diagnosis not present

## 2018-07-24 DIAGNOSIS — M19072 Primary osteoarthritis, left ankle and foot: Secondary | ICD-10-CM | POA: Diagnosis not present

## 2018-07-24 DIAGNOSIS — M7661 Achilles tendinitis, right leg: Secondary | ICD-10-CM | POA: Diagnosis not present

## 2018-07-24 DIAGNOSIS — M79672 Pain in left foot: Secondary | ICD-10-CM | POA: Diagnosis not present

## 2018-07-29 ENCOUNTER — Ambulatory Visit (INDEPENDENT_AMBULATORY_CARE_PROVIDER_SITE_OTHER): Payer: Medicare Other | Admitting: Internal Medicine

## 2018-07-29 ENCOUNTER — Encounter: Payer: Self-pay | Admitting: Internal Medicine

## 2018-07-29 ENCOUNTER — Other Ambulatory Visit: Payer: Self-pay

## 2018-07-29 ENCOUNTER — Other Ambulatory Visit (INDEPENDENT_AMBULATORY_CARE_PROVIDER_SITE_OTHER): Payer: Medicare Other

## 2018-07-29 ENCOUNTER — Other Ambulatory Visit: Payer: Self-pay | Admitting: Internal Medicine

## 2018-07-29 DIAGNOSIS — R739 Hyperglycemia, unspecified: Secondary | ICD-10-CM

## 2018-07-29 DIAGNOSIS — J3081 Allergic rhinitis due to animal (cat) (dog) hair and dander: Secondary | ICD-10-CM

## 2018-07-29 DIAGNOSIS — M79672 Pain in left foot: Secondary | ICD-10-CM

## 2018-07-29 LAB — URIC ACID: Uric Acid, Serum: 6.3 mg/dL (ref 2.4–7.0)

## 2018-07-29 IMAGING — CT CT CHEST W/ CM
2 of 3 series · 15 of 36 positions shown, 18 images · IV contrast (ISOVUE 300)
Comparison: Chest x-ray 04/03/2016

CLINICAL DATA: Questionable left perihilar lesion on chest x-ray,
cough

EXAM:
CT CHEST WITH CONTRAST
TECHNIQUE: Multidetector CT imaging of the chest was performed during
intravenous contrast administration.
CONTRAST:  80mL A6FAVL-ILL IOPAMIDOL (A6FAVL-ILL) INJECTION 61%

[Series 2: thorax · axial · 0.72mm/px · z∈[-246,-18]mm · 12 of 134 slices shown, 15 images]
[im 10/134  mediastinal]
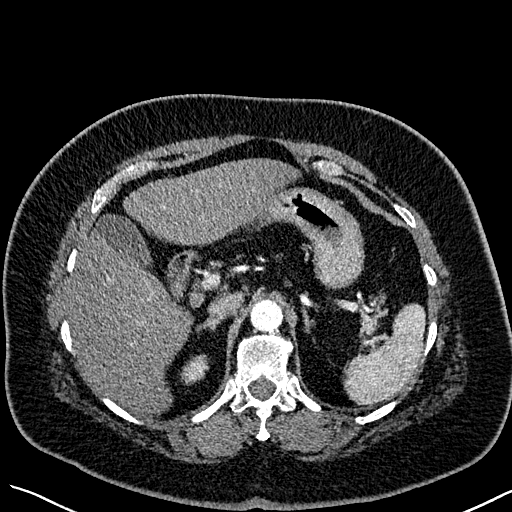
[im 10/134  lung]
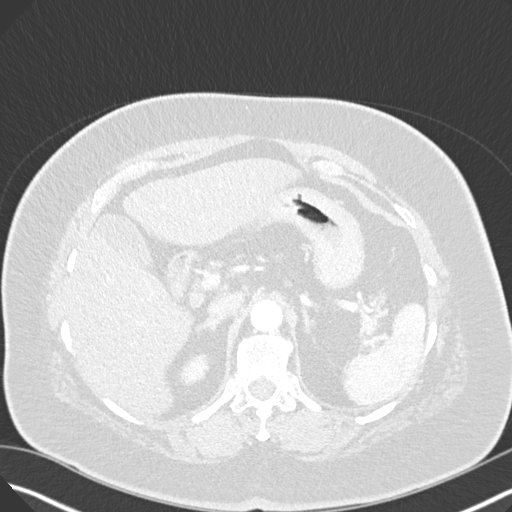
[im 20/134  lung]
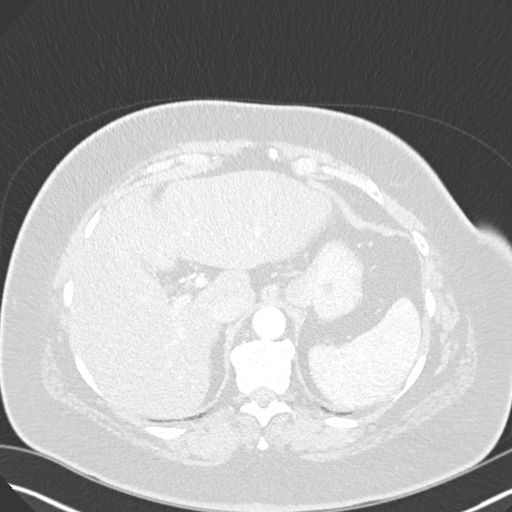
[im 30/134  lung]
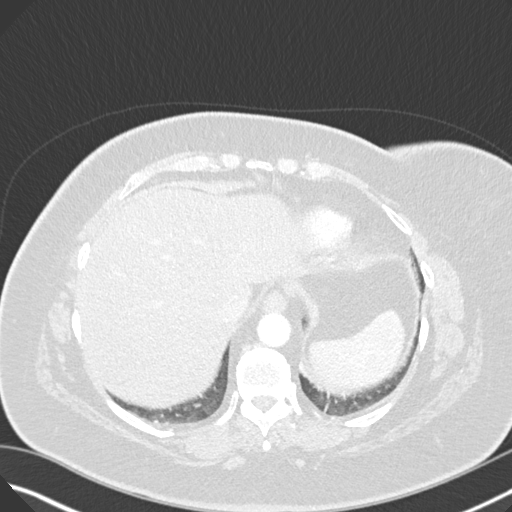
[im 40/134  lung]
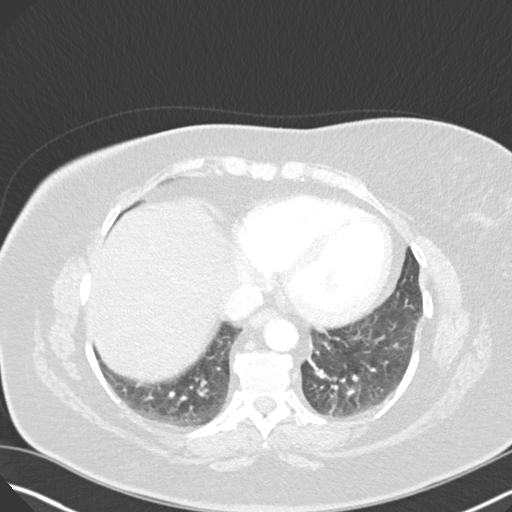
[im 50/134  mediastinal]
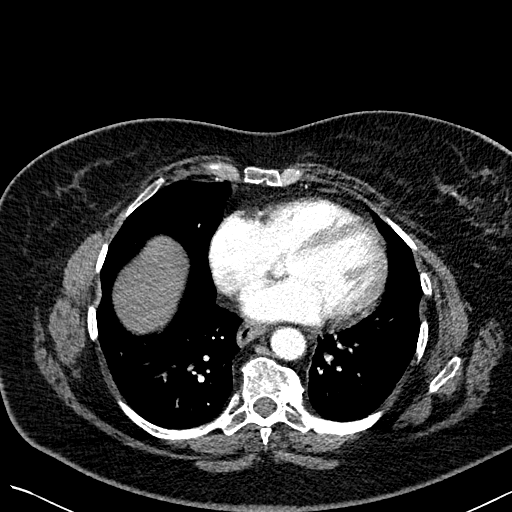
[im 50/134  lung]
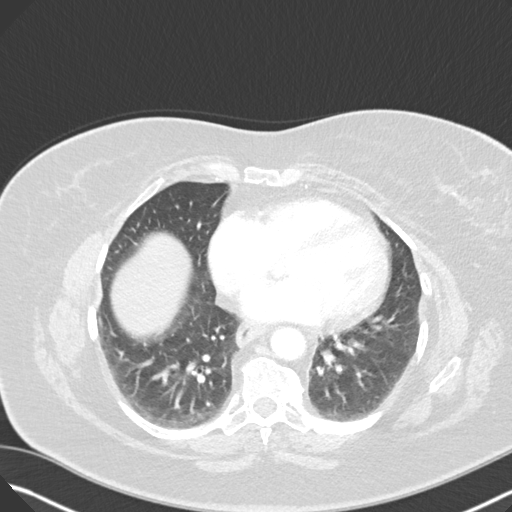
[im 60/134  lung]
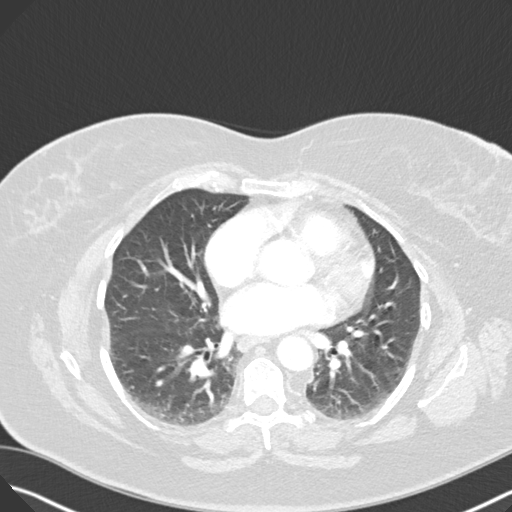
[im 74/134  lung]
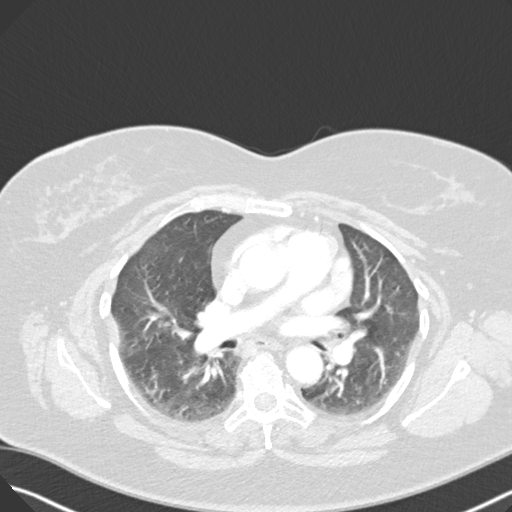
[im 84/134  lung]
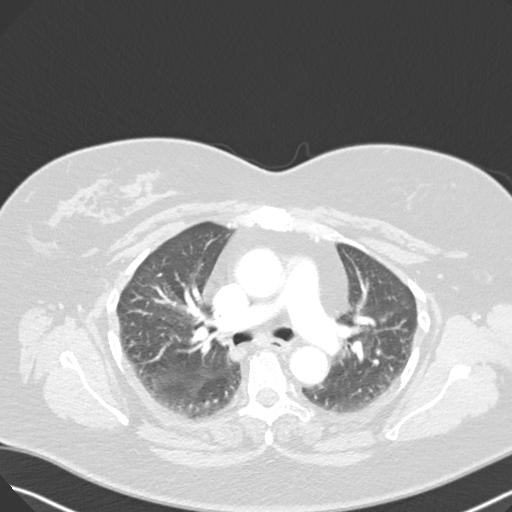
[im 94/134  mediastinal]
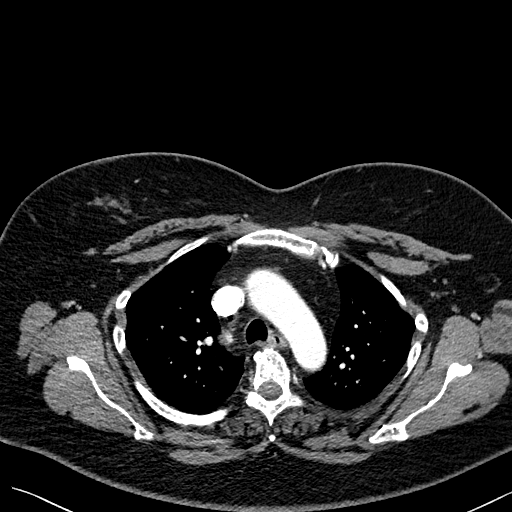
[im 94/134  lung]
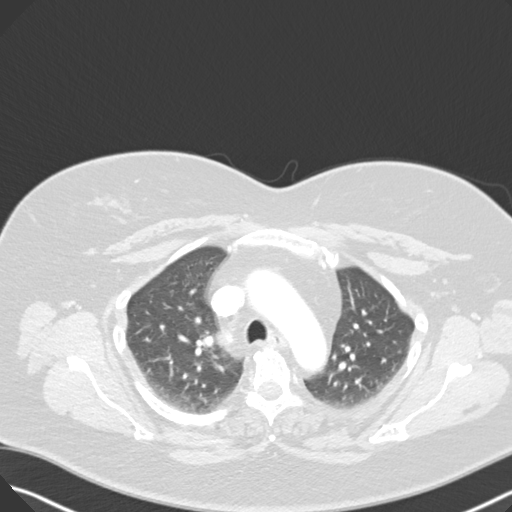
[im 104/134  lung]
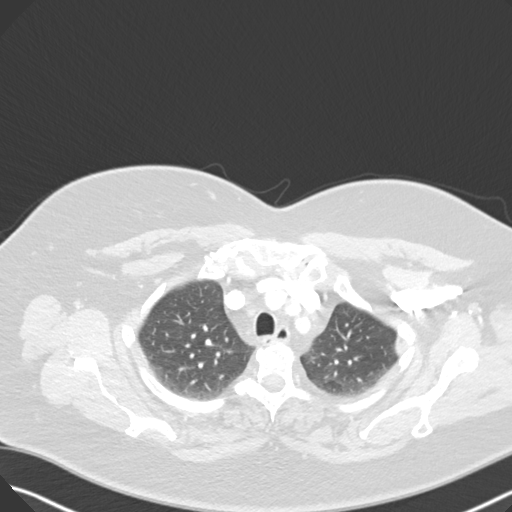
[im 114/134  lung]
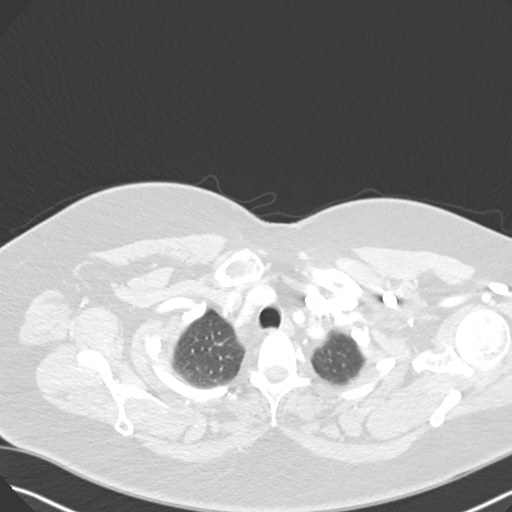
[im 124/134  lung]
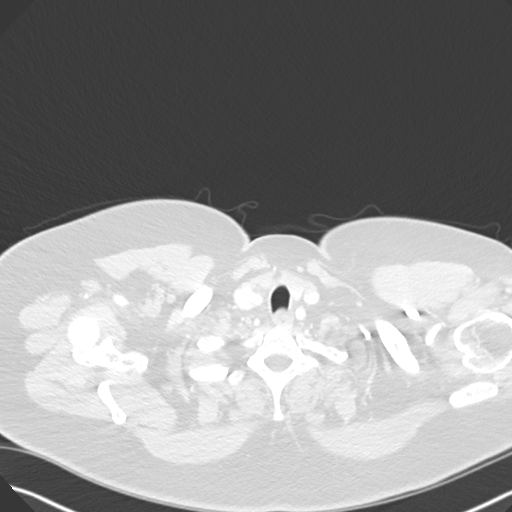

[Series 5: coronal · coronal · 0.59mm/px · 3 of 111 slices shown]
[im 23/111  lung]
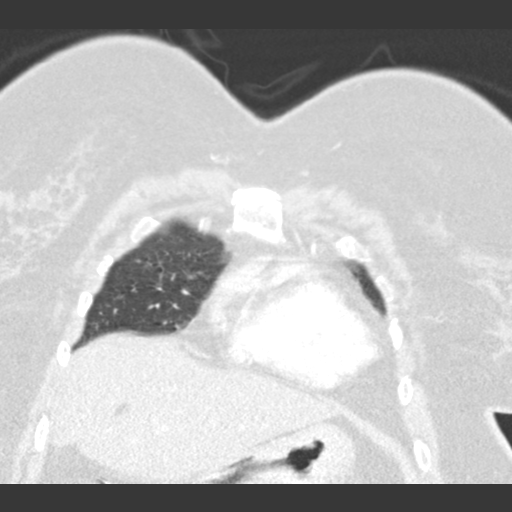
[im 45/111  lung]
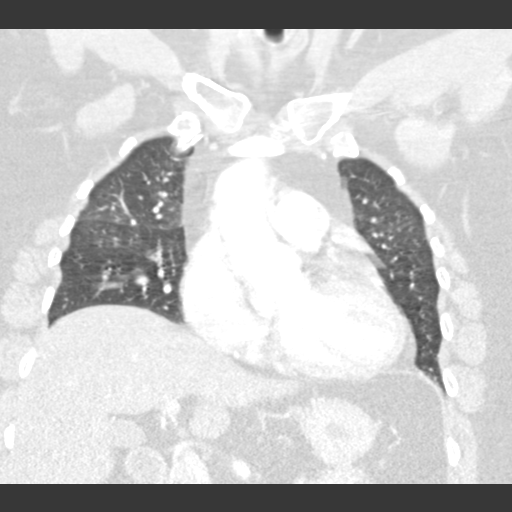
[im 67/111  lung]
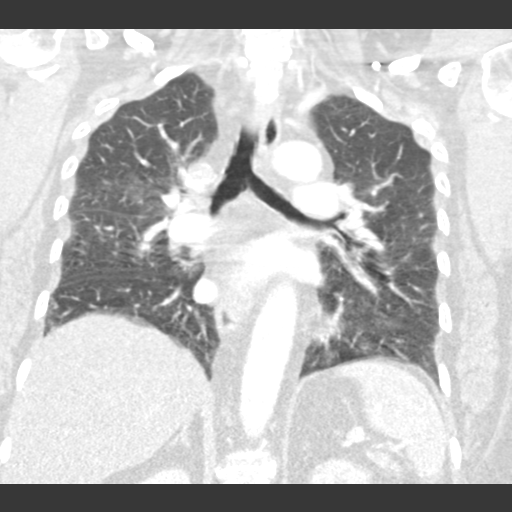

[15 of 36 positions shown; findings below may reference images not displayed]

FINDINGS: Cardiovascular: The pulmonary arteries are moderately well
opacified. No central abnormality is noted. There is good
opacification of the thoracic aorta. The mid ascending thoracic
aorta measures 32 mm in diameter. Some respiratory motion does
obscure detail. No coronary artery calcifications are evident. The
heart is mildly enlarged and no pericardial effusion is seen.

Mediastinum/Nodes: No mediastinal or hilar adenopathy is noted. The
thyroid gland is unremarkable.

Lungs/Pleura: On lung window images, no lung parenchymal abnormality
is noted. No pleural effusion is seen. No suspicious lung nodule or
mass is evident.

Upper Abdomen: The liver appears somewhat low in attenuation and
fatty infiltration is a consideration. No calcified gallstones are
seen.

Musculoskeletal: The nodular area questioned overlying the left
hilum on frontal chest x-ray appears to be due to degenerative
spurring from the thoracic spine. No lung lesion is seen and no
mediastinal mass or adenopathy is noted.
IMPRESSION: 1. The area questioned on chest x-ray appears to be due to
degenerative spurring from the mid thoracic spine. No lung lesion or
mediastinal abnormality is evident.
2. Low-attenuation of the liver may indicate fatty infiltration.
Correlate with LFTs.

## 2018-07-29 MED ORDER — ERGOCALCIFEROL 1.25 MG (50000 UT) PO CAPS: 50000.0000 [IU] | ORAL_CAPSULE | ORAL | 1 refills | Status: AC

## 2018-07-29 MED ORDER — MELOXICAM 15 MG PO TABS: 15.0000 mg | ORAL_TABLET | Freq: Every day | ORAL | 1 refills | Status: DC | PRN

## 2018-07-29 NOTE — Assessment & Plan Note (Signed)
Dr Paulla Dolly  H/o remote surgery Per X ray 2019: Previous osteotomies of the first and fifth metatarsals with K-wire and screw. Old healed fracture of the proximal phalanx of the second toe. No evidence of recent fracture. Mild osteoarthritis of the midfoot. Small calcaneal spurs.

## 2018-07-29 NOTE — Assessment & Plan Note (Signed)
Labs

## 2018-07-29 NOTE — Progress Notes (Signed)
Subjective:  Patient ID: Heidi Good, female    DOB: October 29, 1960  Age: 58 y.o. MRN: [id number]  CC: No chief complaint on file.   HPI Heidi Good [facility] presents for L foot pain since Dec 2019: midfoot OA - she saw Dr Paulla Dolly a year ago, had a shot F/u elevated glucose, anxiety  Outpatient Medications Prior to Visit  Medication Sig Dispense Refill  . albuterol (PROVENTIL HFA;VENTOLIN HFA) 108 (90 Base) MCG/ACT inhaler Inhale 1-2 puffs into the lungs every 6 (six) hours as needed for wheezing or shortness of breath. 1 Inhaler 5  . albuterol (PROVENTIL) (2.5 MG/3ML) 0.083% nebulizer solution Take 3 mLs (2.5 mg total) by nebulization every 6 (six) hours as needed for wheezing or shortness of breath. 75 mL 5  . ALPRAZolam (XANAX) 0.5 MG tablet TK 1 T PO QD PRA  0  . ARIPiprazole (ABILIFY) 15 MG tablet Take 1 tablet (15 mg total) by mouth daily. 30 tablet 3  . Cholecalciferol (VITAMIN D3) 2000 units capsule Take 1 capsule (2,000 Units total) by mouth daily. 100 capsule 3  . diclofenac (VOLTAREN) 75 MG EC tablet Take 1 tablet (75 mg total) by mouth 2 (two) times daily. 30 tablet 0  . ergocalciferol (VITAMIN D2) 50000 units capsule Take 1 capsule (50,000 Units total) by mouth once a week. 6 capsule 0  . erythromycin ophthalmic ointment Place 1 application into the left eye at bedtime. L eye qhs x 5 days 3.5 g 0  . estradiol (ESTRACE) 1 MG tablet Take 1 tablet (1 mg total) by mouth daily. 90 tablet 3  . fluticasone (FLONASE) 50 MCG/ACT nasal spray Place 2 sprays into both nostrils daily. 16 g 2  . fluticasone (FLOVENT HFA) 110 MCG/ACT inhaler Inhale 2 puffs into the lungs 2 (two) times daily. 1 Inhaler 12  . levothyroxine (SYNTHROID, LEVOTHROID) 75 MCG tablet Take 1 tablet (75 mcg total) by mouth daily. 90 tablet 3  . loratadine (CLARITIN) 10 MG tablet Take 1 tablet (10 mg total) by mouth daily. 90 tablet 3  . NITRO-BID 2 % ointment Place 1 application rectally. As needed    . Olopatadine HCl  (PATADAY) 0.2 % SOLN Apply to eye.    Marland Kitchen Plecanatide (TRULANCE) 3 MG TABS Take 3 mg by mouth daily. 21 tablet 0   No facility-administered medications prior to visit.     ROS: Review of Systems  Constitutional: Negative for activity change, appetite change, chills, fatigue and unexpected weight change.  HENT: Positive for hearing loss. Negative for congestion, mouth sores and sinus pressure.   Eyes: Negative for visual disturbance.  Respiratory: Negative for cough and chest tightness.   Gastrointestinal: Negative for abdominal pain and nausea.  Genitourinary: Negative for difficulty urinating, frequency and vaginal pain.  Musculoskeletal: Positive for gait problem. Negative for arthralgias and back pain.  Skin: Negative for pallor and rash.  Neurological: Negative for dizziness, tremors, weakness, numbness and headaches.  Psychiatric/Behavioral: Negative for confusion and sleep disturbance.    Objective:  BP 126/82 (BP Location: Left Arm, Patient Position: Sitting, Cuff Size: Large)   Pulse (!) 56   Temp 98.3 F (36.8 C) (Oral)   Ht 5\' 5"  (1.651 m)   Wt 202 lb (91.6 kg)   SpO2 96%   BMI 33.61 kg/m   BP Readings from Last 3 Encounters:  07/29/18 126/82  06/30/18 126/78  06/16/18 124/80    Wt Readings from Last 3 Encounters:  07/29/18 202 lb (91.6 kg)  06/30/18 203 lb (  92.1 kg)  06/16/18 201 lb (91.2 kg)    Physical Exam Constitutional:      General: She is not in acute distress.    Appearance: She is well-developed.  HENT:     Head: Normocephalic.     Right Ear: External ear normal.     Left Ear: External ear normal.     Nose: Nose normal.  Eyes:     General:        Right eye: No discharge.        Left eye: No discharge.     Conjunctiva/sclera: Conjunctivae normal.     Pupils: Pupils are equal, round, and reactive to light.  Neck:     Musculoskeletal: Normal range of motion and neck supple.     Thyroid: No thyromegaly.     Vascular: No JVD.     Trachea: No  tracheal deviation.  Cardiovascular:     Rate and Rhythm: Normal rate and regular rhythm.     Heart sounds: Normal heart sounds.  Pulmonary:     Effort: No respiratory distress.     Breath sounds: No stridor. No wheezing.  Abdominal:     General: Bowel sounds are normal. There is no distension.     Palpations: Abdomen is soft. There is no mass.     Tenderness: There is no abdominal tenderness. There is no guarding or rebound.  Musculoskeletal:        General: No tenderness.  Lymphadenopathy:     Cervical: No cervical adenopathy.  Skin:    Findings: No erythema or rash.  Neurological:     Cranial Nerves: No cranial nerve deficit.     Motor: No abnormal muscle tone.     Coordination: Coordination normal.     Deep Tendon Reflexes: Reflexes normal.  Psychiatric:        Behavior: Behavior normal.        Thought Content: Thought content normal.        Judgment: Judgment normal.   L dorsal foot is painful  Lab Results  Component Value Date   WBC 14.9 (H) 02/02/2018   HGB 14.7 02/02/2018   HCT 43.2 02/02/2018   PLT 188.0 02/02/2018   GLUCOSE 156 (H) 03/17/2018   CHOL 137 03/10/2015   TRIG 194.0 (H) 03/10/2015   HDL 43.10 03/10/2015   LDLCALC 55 03/10/2015   ALT 34 02/02/2018   AST 51 (H) 02/02/2018   NA 138 03/17/2018   K 3.9 03/17/2018   CL 101 03/17/2018   CREATININE 0.75 03/17/2018   BUN 8 03/17/2018   CO2 30 03/17/2018   TSH 1.14 02/02/2018   HGBA1C 6.9 (H) 02/02/2018    Mm 3d Screen Breast Bilateral  Result Date: 05/27/2018 CLINICAL DATA:  Screening. EXAM: DIGITAL SCREENING BILATERAL MAMMOGRAM WITH TOMO AND CAD COMPARISON:  Previous exam(s). ACR Breast Density Category b: There are scattered areas of fibroglandular density. FINDINGS: There are no findings suspicious for malignancy. Images were processed with CAD. IMPRESSION: No mammographic evidence of malignancy. A result letter of this screening mammogram will be mailed directly to the patient. RECOMMENDATION:  Screening mammogram in one year. (Code:SM-B-01Y) BI-RADS CATEGORY  1: Negative. Electronically Signed   By: Curlene Dolphin M.D.   On: 05/27/2018 16:42    Assessment & Plan:   There are no diagnoses linked to this encounter.   No orders of the defined types were placed in this encounter.    Follow-up: No follow-ups on file.  Walker Kehr, MD

## 2018-07-29 NOTE — Assessment & Plan Note (Signed)
Claritin

## 2018-08-11 DIAGNOSIS — M7661 Achilles tendinitis, right leg: Secondary | ICD-10-CM | POA: Insufficient documentation

## 2018-09-13 ENCOUNTER — Other Ambulatory Visit: Payer: Self-pay | Admitting: Psychiatry

## 2018-09-16 ENCOUNTER — Ambulatory Visit: Payer: Medicare Other | Admitting: Psychiatry

## 2018-10-01 ENCOUNTER — Encounter: Payer: Self-pay | Admitting: Podiatry

## 2018-10-01 ENCOUNTER — Other Ambulatory Visit: Payer: Self-pay

## 2018-10-01 ENCOUNTER — Other Ambulatory Visit: Payer: Self-pay | Admitting: Podiatry

## 2018-10-01 ENCOUNTER — Ambulatory Visit (INDEPENDENT_AMBULATORY_CARE_PROVIDER_SITE_OTHER): Payer: Medicare Other | Admitting: Podiatry

## 2018-10-01 ENCOUNTER — Ambulatory Visit (INDEPENDENT_AMBULATORY_CARE_PROVIDER_SITE_OTHER): Payer: Medicare Other

## 2018-10-01 VITALS — Temp 97.3°F

## 2018-10-01 DIAGNOSIS — M21619 Bunion of unspecified foot: Secondary | ICD-10-CM | POA: Diagnosis not present

## 2018-10-01 DIAGNOSIS — M79671 Pain in right foot: Secondary | ICD-10-CM | POA: Diagnosis not present

## 2018-10-01 DIAGNOSIS — M7661 Achilles tendinitis, right leg: Secondary | ICD-10-CM

## 2018-10-01 DIAGNOSIS — M79672 Pain in left foot: Secondary | ICD-10-CM

## 2018-10-01 MED ORDER — TRIAMCINOLONE ACETONIDE 10 MG/ML IJ SUSP
10.0000 mg | Freq: Once | INTRAMUSCULAR | Status: AC
  Administered 2018-10-01: 10 mg

## 2018-10-01 NOTE — Patient Instructions (Signed)

## 2018-10-02 NOTE — Progress Notes (Signed)
Subjective:   Patient ID: Heidi Good, female   DOB: 58 y.o.   MRN: [id number]   HPI Patient presents stating she has developed a lot of pain in the back of her right heel and it is been inflamed and making it hard for her to walk.  Patient does have a sign interpreter with her and states it is been going on for about 6 months and worse over the last several months.  Patient does not have any other significant conditions except for structural bunion right which is becoming more symptomatic   ROS      Objective:  Physical Exam  Neurovascular status intact muscle strength is adequate with patient's right posterior lateral heel being quite inflamed at the insertion of the Achilles tendon into the calcaneus.  There is no indications of equinus or muscle strength issue and that also noted structural bunion deformity right with correction of the left one that we had obtained a number of years ago     Assessment:  Acute Achilles tendinitis right lateral side and structural bunion deformity right     Plan:  H&P x-rays reviewed and today I did a careful injection of the lateral side of the Achilles at the insertion calcaneus after explaining risk of rupture with 3 mg dexamethasone Kenalog 5 mg Xylocaine and I then went ahead and applied sterile dressings and applied air fracture walker to completely immobilize and allow rest to occur.  Discussed bunion deformity which at one point in future will need to be addressed and we will see her back 2 weeks or earlier if needed  X-ray indicated posterior spur formation right small in nature but present with no other indications of problem with structural bunion deformity noted

## 2018-10-15 ENCOUNTER — Encounter: Payer: Self-pay | Admitting: Podiatry

## 2018-10-15 ENCOUNTER — Ambulatory Visit (INDEPENDENT_AMBULATORY_CARE_PROVIDER_SITE_OTHER): Payer: Medicare Other | Admitting: Podiatry

## 2018-10-15 ENCOUNTER — Other Ambulatory Visit: Payer: Self-pay

## 2018-10-15 VITALS — Temp 98.2°F

## 2018-10-15 DIAGNOSIS — M7752 Other enthesopathy of left foot: Secondary | ICD-10-CM

## 2018-10-15 DIAGNOSIS — M778 Other enthesopathies, not elsewhere classified: Secondary | ICD-10-CM

## 2018-10-15 DIAGNOSIS — M7661 Achilles tendinitis, right leg: Secondary | ICD-10-CM

## 2018-10-15 MED ORDER — TRIAMCINOLONE ACETONIDE 10 MG/ML IJ SUSP
10.0000 mg | Freq: Once | INTRAMUSCULAR | Status: AC
  Administered 2018-10-15: 10 mg

## 2018-10-16 NOTE — Progress Notes (Signed)
Subjective:   Patient ID: Heidi Good, female   DOB: 58 y.o.   MRN: [id number]   HPI Patient presents stating the right Achilles seems to be improving and quite a bit better and has developed a lot of pain on the dorsum of the left foot and presents with interpreter to discuss this   ROS      Objective:  Physical Exam  Neurovascular status intact with significant diminishment of discomfort posterior Achilles tendon right with good range of motion good muscle strength and minimal swelling or other pathology.  The dorsum of the left foot and the extensor complex is very tender when pressed with inflammation fluid buildup     Assessment:  Doing well with Achilles tendinitis right and significant improvement with extensor tendinitis left     Plan:  H&P condition reviewed and today I did sterile prep and injected the dorsal tendon complex left 3 mg Kenalog 5 mg Xylocaine advised on wider shoes and padding and reappoint if symptoms indicate and did discuss the Achilles and continued exercises and appropriate shoe gear

## 2018-10-20 DIAGNOSIS — H0012 Chalazion right lower eyelid: Secondary | ICD-10-CM | POA: Diagnosis not present

## 2018-10-20 DIAGNOSIS — H0102B Squamous blepharitis left eye, upper and lower eyelids: Secondary | ICD-10-CM | POA: Diagnosis not present

## 2018-10-20 DIAGNOSIS — H0015 Chalazion left lower eyelid: Secondary | ICD-10-CM | POA: Diagnosis not present

## 2018-10-20 DIAGNOSIS — H2513 Age-related nuclear cataract, bilateral: Secondary | ICD-10-CM | POA: Diagnosis not present

## 2018-10-20 DIAGNOSIS — H0102A Squamous blepharitis right eye, upper and lower eyelids: Secondary | ICD-10-CM | POA: Diagnosis not present

## 2018-10-27 ENCOUNTER — Encounter: Payer: Self-pay | Admitting: Psychiatry

## 2018-10-27 ENCOUNTER — Ambulatory Visit (INDEPENDENT_AMBULATORY_CARE_PROVIDER_SITE_OTHER): Payer: Medicare Other | Admitting: Psychiatry

## 2018-10-27 ENCOUNTER — Other Ambulatory Visit: Payer: Self-pay

## 2018-10-27 DIAGNOSIS — F200 Paranoid schizophrenia: Secondary | ICD-10-CM

## 2018-10-27 DIAGNOSIS — F99 Mental disorder, not otherwise specified: Secondary | ICD-10-CM

## 2018-10-27 DIAGNOSIS — F5105 Insomnia due to other mental disorder: Secondary | ICD-10-CM

## 2018-10-27 MED ORDER — ALPRAZOLAM 0.5 MG PO TABS: 0.5000 mg | ORAL_TABLET | Freq: Every day | ORAL | 1 refills | Status: DC | PRN

## 2018-10-27 MED ORDER — ARIPIPRAZOLE 15 MG PO TABS: ORAL_TABLET | ORAL | 5 refills | Status: DC

## 2018-10-27 NOTE — Progress Notes (Signed)
[name] [id number] 1960/06/15 58 y.o.  Subjective:   Patient ID:  Heidi Good is a 58 y.o. (DOB 1960-11-22) female.  Chief Complaint: Last seen March 26, 2018 Chief Complaint  Patient presents with  . Follow-up    Medication Management  . Depression    Medication Management  . Paranoid    Medication Management  . Schizophrenia    Medication Management    Depression        Associated symptoms include no decreased concentration and no suicidal ideas.  patient is deaf and seen with an interpreter.  She has notes. [facility] presents to the office today for follow-up of schizophrenia.  Last seen June 17, 2018.  She continues on Abilify 15 mg daily and no meds were changed.  No problems with meds.  Problems with mother.  Behavior is changing andnot thinking straight.  56 M to PCP and they can't help her.  M gets confused and sleeping a lot. B comes and helps.    Denies feeling afraid at home.  B will move in when M passes away.  Has several dogs.  [place] Shepherds make her feel safer.  Abilify 15 mg increase helped her.     In November bothered bc could see something and feel something walk by outside and dog acted funny.  Then brother came over and checked things.  She felt someone was walking around outside.  Says this happened 7 times by mid DEC.  She feels in danger from this incidents.  Never saw anyone but believed there was someone there.  Police came by and didn't find anything.  Thinks it's an old friend.    Rare Xanax is helpful.  Patient reports stable mood and denies depressed or irritable moods.  Patient denies any recent difficulty with anxiety except as noted.  Patient denies difficulty with sleep initiation or maintenance. Denies appetite disturbance.  Patient reports that energy and motivation have been good.  Patient denies any difficulty with concentration.  Patient denies any suicidal ideation.  15 mg Abilify helped her feel better, calmer  than did the 10 mg Abilify.  No SE and claims compliance.  Denies voices, depression, anger on this dosage.  Sleep good 8 hours.  No drowsiness. Watches news.  Other psychiatric medications tried include perphenazine which she did not like, loxapine which she did not like, and Saphris.  She complained that it gave her strange feeling under the tongue.  She has taken other psychiatric medications in the past but she does not know the names of them and we have not been able to get the records.  Review of Systems:  Review of Systems  HENT:       Deaf  Respiratory: Negative for cough and shortness of breath.   Neurological: Negative for tremors and weakness.  Psychiatric/Behavioral: Positive for depression. Negative for agitation, behavioral problems, confusion, decreased concentration, dysphoric mood, hallucinations, self-injury, sleep disturbance and suicidal ideas. The patient is nervous/anxious. The patient is not hyperactive.     Medications: I have reviewed the patient's current medications.  Current Outpatient Medications  Medication Sig Dispense Refill  . albuterol (PROVENTIL HFA;VENTOLIN HFA) 108 (90 Base) MCG/ACT inhaler Inhale 1-2 puffs into the lungs every 6 (six) hours as needed for wheezing or shortness of breath. 1 Inhaler 5  . albuterol (PROVENTIL) (2.5 MG/3ML) 0.083% nebulizer solution Take 3 mLs (2.5 mg total) by nebulization every 6 (six) hours as needed for wheezing or shortness of breath. 75 mL  5  . ALPRAZolam (XANAX) 0.5 MG tablet TK 1 T PO QD PRA  0  . ARIPiprazole (ABILIFY) 15 MG tablet TAKE 1 TABLET(15 MG) BY MOUTH DAILY 30 tablet 3  . Cholecalciferol (VITAMIN D3) 2000 units capsule Take 1 capsule (2,000 Units total) by mouth daily. 100 capsule 3  . diclofenac (VOLTAREN) 75 MG EC tablet Take 1 tablet (75 mg total) by mouth 2 (two) times daily. 30 tablet 0  . ergocalciferol (VITAMIN D2) 1.25 MG (50000 UT) capsule Take 1 capsule (50,000 Units total) by mouth every 30  (thirty) days. 6 capsule 1  . erythromycin ophthalmic ointment Place 1 application into the left eye at bedtime. L eye qhs x 5 days 3.5 g 0  . estradiol (ESTRACE) 1 MG tablet Take 1 tablet (1 mg total) by mouth daily. 90 tablet 3  . fluticasone (FLOVENT HFA) 110 MCG/ACT inhaler Inhale 2 puffs into the lungs 2 (two) times daily. 1 Inhaler 12  . levothyroxine (SYNTHROID, LEVOTHROID) 75 MCG tablet Take 1 tablet (75 mcg total) by mouth daily. 90 tablet 3  . loratadine (CLARITIN) 10 MG tablet Take 1 tablet (10 mg total) by mouth daily. 90 tablet 3  . meloxicam (MOBIC) 15 MG tablet Take 1 tablet (15 mg total) by mouth daily as needed for pain (use pc). 30 tablet 1  . NITRO-BID 2 % ointment Place 1 application rectally. As needed    . Olopatadine HCl (PATADAY) 0.2 % SOLN Apply to eye.    Marland Kitchen Plecanatide (TRULANCE) 3 MG TABS Take 3 mg by mouth daily. 21 tablet 0   No current facility-administered medications for this visit.     Medication Side Effects: None  Allergies:  Allergies  Allergen Reactions  . Doxycycline Nausea And Vomiting  . Hydrocodone     Nausea from cough syrup  . Other     Dog, cat, nuts, grass, trees    Past Medical History:  Diagnosis Date  . Anxiety   . Constipation   . Deafness   . Depression   . Diabetes mellitus ([place])   . Gastroparesis   . Hyperlipemia   . Hypothyroidism     Family History  Problem Relation Age of Onset  . Diabetes Mother   . Colon polyps Mother        over 150 polyps detected  . Heart disease Father   . Multiple myeloma Father   . Emphysema Father   . Heart attack Father   . Liver cancer Maternal Grandmother        not primary source, spread to her liver  . Heart attack Maternal Grandfather   . Emphysema Paternal Grandfather   . Colon cancer Neg Hx   . Esophageal cancer Neg Hx   . Rectal cancer Neg Hx   . Stomach cancer Neg Hx     Social History   Socioeconomic History  . Marital status: Divorced    Spouse name: Not on file   . Number of children: 0  . Years of education: Not on file  . Highest education level: Not on file  Occupational History  . Occupation: disablied    Employer: RETIRED  Social Needs  . Financial resource strain: Not on file  . Food insecurity    Worry: Not on file    Inability: Not on file  . Transportation needs    Medical: Not on file    Non-medical: Not on file  Tobacco Use  . Smoking status: Never Smoker  . Smokeless  tobacco: Never Used  Substance and Sexual Activity  . Alcohol use: No  . Drug use: No  . Sexual activity: Not on file  Lifestyle  . Physical activity    Days per week: Not on file    Minutes per session: Not on file  . Stress: Not on file  Relationships  . Social Herbalist on phone: Not on file    Gets together: Not on file    Attends religious service: Not on file    Active member of club or organization: Not on file    Attends meetings of clubs or organizations: Not on file    Relationship status: Not on file  . Intimate partner violence    Fear of current or ex partner: Not on file    Emotionally abused: Not on file    Physically abused: Not on file    Forced sexual activity: Not on file  Other Topics Concern  . Not on file  Social History Narrative  . Not on file   She drives.  Still lives with her neighbor.  Past Medical History, Surgical history, Social history, and Family history were reviewed and updated as appropriate.   Please see review of systems for further details on the patient's review from today.   Objective:   Physical Exam:  There were no vitals taken for this visit.  Physical Exam Constitutional:      General: She is not in acute distress.    Appearance: She is well-developed.  Musculoskeletal:        General: No deformity.  Neurological:     Mental Status: She is alert and oriented to person, place, and time.     Motor: No tremor.     Coordination: Coordination normal.     Gait: Gait normal.   Psychiatric:        Attention and Perception: She is attentive. She does not perceive auditory hallucinations.        Mood and Affect: Mood is not anxious or depressed. Affect is not labile, blunt, angry or inappropriate.        Speech: Speech normal.        Behavior: Behavior normal. Behavior is not agitated, aggressive or hyperactive. Behavior is cooperative.        Thought Content: Thought content is paranoid. Thought content does not include homicidal or suicidal ideation. Thought content does not include homicidal or suicidal plan.        Cognition and Memory: Cognition normal.        Judgment: Judgment normal.     Comments: Insight poor.  No auditory or visual hallucinations. No delusions.  Pleasant     Lab Review:     Component Value Date/Time   NA 138 03/17/2018 0820   K 3.9 03/17/2018 0820   CL 101 03/17/2018 0820   CO2 30 03/17/2018 0820   GLUCOSE 156 (H) 03/17/2018 0820   BUN 8 03/17/2018 0820   CREATININE 0.75 03/17/2018 0820   CALCIUM 9.1 03/17/2018 0820   PROT 7.5 02/02/2018 1619   ALBUMIN 3.6 02/02/2018 1619   AST 51 (H) 02/02/2018 1619   ALT 34 02/02/2018 1619   ALKPHOS 117 02/02/2018 1619   BILITOT 0.7 02/02/2018 1619   GFRNONAA 95 07/11/2008 1616   GFRAA 115 07/11/2008 1616       Component Value Date/Time   WBC 14.9 (H) 02/02/2018 1619   RBC 4.78 02/02/2018 1619   HGB 14.7 02/02/2018 1619   HCT  43.2 02/02/2018 1619   PLT 188.0 02/02/2018 1619   MCV 90.3 02/02/2018 1619   MCHC 34.0 02/02/2018 1619   RDW 13.1 02/02/2018 1619   LYMPHSABS 4.7 (H) 02/02/2018 1619   MONOABS 1.1 (H) 02/02/2018 1619   EOSABS 0.5 02/02/2018 1619   BASOSABS 0.1 02/02/2018 1619    No results found for: POCLITH, LITHIUM   No results found for: PHENYTOIN, PHENOBARB, VALPROATE, CBMZ   .res Assessment: Plan:    Malyia was seen today for follow-up, depression, paranoid and schizophrenia.  Diagnoses and all orders for this visit:  Paranoid schizophrenia  ([place])  Insomnia due to other mental disorder    Greater than 50% of face to face time with patient and an interpretor for 30 mins was spent on counseling and coordination of care. We discussed potential metabolic side effects associated with atypical antipsychotics, as well as potential risk for movement side effects. Advised pt to contact office if movement side effects occur.   Pt has had a relapse of paranoid psychosis. Offered option of increasing the Abilify.  Refuses increases.  Watches TV, cares for mother, no anger problems.  She is not agitated nor hostile though she is paranoid.  She denies being on a great deal of fear but the situation does make her nervous.  She is able to function normally.  She is agreed to continue Abilify 15 mg which she has taken for many years in the past to good effect.  She had a brief period of time where she was convinced it was not the right medicine for her.  We were not able to find any other medications that she felt satisfied taking.  It does not not appear that she has active psychosis at the moment.  She has a history of repeated paranoid psychotic episodes.  During some of these she could become agitated and aggressive with her mother.  She agrees that this is a good medicine for her  Her labs are followed by her primary care doctor.  No med changes today.   This appointment was 30 minutes  Follow-up 5 months.  She was encouraged to call if she decided to increase the medication before the next appointment.  Lynder Parents MD, DFAPA  Please see After Visit Summary for patient specific instructions.  Future Appointments  Date Time Provider [place]  12/02/2018  1:40 PM Plotnikov, Evie Lacks, MD [id number]    No orders of the defined types were placed in this encounter.     -------------------------------

## 2018-12-02 ENCOUNTER — Encounter: Payer: Self-pay | Admitting: Internal Medicine

## 2018-12-02 ENCOUNTER — Ambulatory Visit (INDEPENDENT_AMBULATORY_CARE_PROVIDER_SITE_OTHER): Payer: Medicare Other | Admitting: Internal Medicine

## 2018-12-02 ENCOUNTER — Other Ambulatory Visit (INDEPENDENT_AMBULATORY_CARE_PROVIDER_SITE_OTHER): Payer: Medicare Other

## 2018-12-02 ENCOUNTER — Other Ambulatory Visit: Payer: Self-pay

## 2018-12-02 DIAGNOSIS — R5383 Other fatigue: Secondary | ICD-10-CM | POA: Diagnosis not present

## 2018-12-02 DIAGNOSIS — K219 Gastro-esophageal reflux disease without esophagitis: Secondary | ICD-10-CM | POA: Diagnosis not present

## 2018-12-02 DIAGNOSIS — J4520 Mild intermittent asthma, uncomplicated: Secondary | ICD-10-CM

## 2018-12-02 DIAGNOSIS — E034 Atrophy of thyroid (acquired): Secondary | ICD-10-CM

## 2018-12-02 DIAGNOSIS — M2550 Pain in unspecified joint: Secondary | ICD-10-CM

## 2018-12-02 DIAGNOSIS — R7303 Prediabetes: Secondary | ICD-10-CM | POA: Diagnosis not present

## 2018-12-02 LAB — HEMOGLOBIN A1C: Hgb A1c MFr Bld: 6 % (ref 4.6–6.5)

## 2018-12-02 LAB — BASIC METABOLIC PANEL
BUN: 6 mg/dL (ref 6–23)
CO2: 27 mEq/L (ref 19–32)
Calcium: 9.2 mg/dL (ref 8.4–10.5)
Chloride: 98 mEq/L (ref 96–112)
Creatinine, Ser: 0.7 mg/dL (ref 0.40–1.20)
GFR: 86.11 mL/min (ref >60.00)
Glucose, Bld: 84 mg/dL (ref 70–99)
Potassium: 3.3 mEq/L — ABNORMAL LOW (ref 3.5–5.1)
Sodium: 136 mEq/L (ref 135–145)

## 2018-12-02 LAB — URINALYSIS
Bilirubin Urine: NEGATIVE
Ketones, ur: NEGATIVE
Leukocytes,Ua: NEGATIVE
Nitrite: NEGATIVE
Specific Gravity, Urine: 1.01 (ref 1.000–1.030)
Total Protein, Urine: NEGATIVE
Urine Glucose: NEGATIVE
Urobilinogen, UA: 0.2 (ref 0.0–1.0)
pH: 6.5 (ref 5.0–8.0)

## 2018-12-02 MED ORDER — FLOVENT DISKUS 100 MCG/BLIST IN AEPB: 1.0000 | INHALATION_SPRAY | Freq: Two times a day (BID) | RESPIRATORY_TRACT | 3 refills | Status: DC

## 2018-12-02 NOTE — Assessment & Plan Note (Signed)
Levothroid °Labs °

## 2018-12-02 NOTE — Assessment & Plan Note (Signed)
Wt loss  

## 2018-12-02 NOTE — Patient Instructions (Addendum)
If you have medicare related insurance (such as traditional Medicare, [employer], [employer], or similar), Please make an appointment at the scheduling desk with Sharee Pimple, the [employer], for your Wellness visit in this office, which is a benefit with your insurance.      Voltaren gel    These suggestions will probably help you to improve your metabolism if you are not overweight and to lose weight if you are overweight: 1.  Reduce your consumption of sugars and starches.  Eliminate high fructose corn syrup from your diet.  Reduce your consumption of processed foods.  For desserts try to have seasonal fruits, berries with with green, nuts, cheeses or dark chocolate with more than 70% cacao. 2.  Do not snack 3.  You do not have to eat breakfast.  If you choose to have breakfast-eat plain greek yogurt, eggs, oatmeal (without sugar) 4.  Drink water, freshly brewed unsweetened tea (green, black or herbal) or coffee.  Do not drink sodas including diet sodas , juices, beverages sweetened with artificial sweeteners. 5.  Reduce your consumption of refined grains. 6.  Avoid protein drinks such as Optifast, Slim fast etc. Eat chicken, fish, meat, dairy and beans for your sources of protein 7.  Natural unprocessed fats like cold pressed virgin olive oil, butter, coconut oil are good for you.  Eat avocados 8.  Increase your consumption of fiber.  Fruits, berries, vegetables, whole grains, flaxseeds, Chia seeds, beans, popcorn, nuts, oatmeal are good sources of fiber 9.  Use vinegar in your diet, i.e. apple cider vinegar, red wine or balsamic vinegar 10.  You can try fasting.  For example you can skip breakfast and lunch every other day (24-hour fast) 11.  Stress reduction, good night sleep, relaxation, meditation, yoga and other physical activity is likely to help you to maintain low weight too. 12.  If you drink alcohol, limit your alcohol intake to no more than 2 drinks a  day.   Mediterranean diet is good for you. ([employer] has a typical Mediterranean cuisine menu) The Mediterranean diet is a way of eating based on the traditional cuisine of countries bordering the [employer]. While there is no single definition of the Mediterranean diet, it is typically high in vegetables, fruits, whole grains, beans, nut and seeds, and olive oil. The main components of Mediterranean diet include: Marland Kitchen Daily consumption of vegetables, fruits, whole grains and healthy fats  . Weekly intake of fish, poultry, beans and eggs  . Moderate portions of dairy products  . Limited intake of red meat Other important elements of the Mediterranean diet are sharing meals with family and friends, enjoying a glass of red wine and being physically active. Health benefits of a Mediterranean diet: A traditional Mediterranean diet consisting of large quantities of fresh fruits and vegetables, nuts, fish and olive oil-coupled with physical activity-can reduce your risk of serious mental and physical health problems by: Preventing heart disease and strokes. Following a Mediterranean diet limits your intake of refined breads, processed foods, and red meat, and encourages drinking red wine instead of hard liquor-all factors that can help prevent heart disease and stroke. Keeping you agile. If you're an older adult, the nutrients gained with a Mediterranean diet may reduce your risk of developing muscle weakness and other signs of frailty by about 70 percent. Reducing the risk of Alzheimer's. Research suggests that the [place] diet may improve cholesterol, blood sugar levels, and overall blood vessel health, which in turn may  reduce your risk of Alzheimer's disease or dementia. Halving the risk of Parkinson's disease. The high levels of antioxidants in the Mediterranean diet can prevent cells from undergoing a damaging process called oxidative stress, thereby cutting the risk of Parkinson's  disease in half. Increasing longevity. By reducing your risk of developing heart disease or cancer with the Mediterranean diet, you're reducing your risk of death at any age by 20%. Protecting against type 2 diabetes. A Mediterranean diet is rich in fiber which digests slowly, prevents huge swings in blood sugar, and can help you maintain a healthy weight.    Cabbage soup recipe that will not make you gain weight: Take 1 small head of cabbage, 1 average pack of celery, 4 green peppers, 4 onions, 2 cans diced tomatoes (they are not available without salt), salt and spices to taste.  Chop cabbage, celery, peppers and onions.  And tomatoes and 2-2.5 liters (2.5 quarts) of water so that it would just cover the vegetables.  Bring to boil.  Add spices and salt.  Turn heat to low/medium and simmer for 20-25 minutes.  Naturally, you can make a smaller batch and change some of the ingredients.

## 2018-12-02 NOTE — Progress Notes (Signed)
Subjective:  Patient ID: Heidi Good, female    DOB: [birth year]  Age: 58 y.o. MRN: [id number]  CC: No chief complaint on file.   HPI Heidi Good presents for anxiety, asthma, hypothyroidism f/u  Outpatient Medications Prior to Visit  Medication Sig Dispense Refill  . albuterol (PROVENTIL HFA;VENTOLIN HFA) 108 (90 Base) MCG/ACT inhaler Inhale 1-2 puffs into the lungs every 6 (six) hours as needed for wheezing or shortness of breath. 1 Inhaler 5  . albuterol (PROVENTIL) (2.5 MG/3ML) 0.083% nebulizer solution Take 3 mLs (2.5 mg total) by nebulization every 6 (six) hours as needed for wheezing or shortness of breath. 75 mL 5  . ALPRAZolam (XANAX) 0.5 MG tablet Take 1 tablet (0.5 mg total) by mouth daily as needed for anxiety. 30 tablet 1  . ARIPiprazole (ABILIFY) 15 MG tablet TAKE 1 TABLET(15 MG) BY MOUTH DAILY 30 tablet 5  . Cholecalciferol (VITAMIN D3) 2000 units capsule Take 1 capsule (2,000 Units total) by mouth daily. 100 capsule 3  . diclofenac (VOLTAREN) 75 MG EC tablet Take 1 tablet (75 mg total) by mouth 2 (two) times daily. 30 tablet 0  . ergocalciferol (VITAMIN D2) 1.25 MG (50000 UT) capsule Take 1 capsule (50,000 Units total) by mouth every 30 (thirty) days. 6 capsule 1  . erythromycin ophthalmic ointment Place 1 application into the left eye at bedtime. L eye qhs x 5 days 3.5 g 0  . estradiol (ESTRACE) 1 MG tablet Take 1 tablet (1 mg total) by mouth daily. 90 tablet 3  . fluticasone (FLOVENT HFA) 110 MCG/ACT inhaler Inhale 2 puffs into the lungs 2 (two) times daily. 1 Inhaler 12  . levothyroxine (SYNTHROID, LEVOTHROID) 75 MCG tablet Take 1 tablet (75 mcg total) by mouth daily. 90 tablet 3  . loratadine (CLARITIN) 10 MG tablet Take 1 tablet (10 mg total) by mouth daily. 90 tablet 3  . meloxicam (MOBIC) 15 MG tablet Take 1 tablet (15 mg total) by mouth daily as needed for pain (use pc). 30 tablet 1  . NITRO-BID 2 % ointment Place 1 application rectally. As needed    .  Olopatadine HCl (PATADAY) 0.2 % SOLN Apply to eye.    Marland Kitchen Plecanatide (TRULANCE) 3 MG TABS Take 3 mg by mouth daily. 21 tablet 0   No facility-administered medications prior to visit.     ROS: Review of Systems  Constitutional: Negative for activity change, appetite change, chills, fatigue and unexpected weight change.  HENT: Negative for congestion, mouth sores and sinus pressure.   Eyes: Negative for visual disturbance.  Respiratory: Negative for cough and chest tightness.   Gastrointestinal: Negative for abdominal pain and nausea.  Genitourinary: Negative for difficulty urinating, frequency and vaginal pain.  Musculoskeletal: Positive for arthralgias and gait problem. Negative for back pain.  Skin: Negative for pallor and rash.  Neurological: Negative for dizziness, tremors, weakness, numbness and headaches.  Psychiatric/Behavioral: Negative for confusion, sleep disturbance and suicidal ideas.    Objective:  BP 134/70 (BP Location: Left Arm, Patient Position: Sitting, Cuff Size: Large)   Pulse 89   Temp 99 F (37.2 C) (Oral)   Ht 5\' 5"  (1.651 m)   Wt 201 lb (91.2 kg)   SpO2 97%   BMI 33.45 kg/m   BP Readings from Last 3 Encounters:  12/02/18 134/70  07/29/18 126/82  06/30/18 126/78    Wt Readings from Last 3 Encounters:  12/02/18 201 lb (91.2 kg)  07/29/18 202 lb (91.6 kg)  06/30/18 203 lb (  92.1 kg)    Physical Exam Constitutional:      General: She is not in acute distress.    Appearance: She is well-developed.  HENT:     Head: Normocephalic.     Right Ear: External ear normal.     Left Ear: External ear normal.     Nose: Nose normal.  Eyes:     General:        Right eye: No discharge.        Left eye: No discharge.     Conjunctiva/sclera: Conjunctivae normal.     Pupils: Pupils are equal, round, and reactive to light.  Neck:     Musculoskeletal: Normal range of motion and neck supple.     Thyroid: No thyromegaly.     Vascular: No JVD.     Trachea: No  tracheal deviation.  Cardiovascular:     Rate and Rhythm: Normal rate and regular rhythm.     Heart sounds: Normal heart sounds.  Pulmonary:     Effort: No respiratory distress.     Breath sounds: No stridor. No wheezing.  Abdominal:     General: Bowel sounds are normal. There is no distension.     Palpations: Abdomen is soft. There is no mass.     Tenderness: There is no abdominal tenderness. There is no guarding or rebound.  Musculoskeletal:        General: No tenderness.  Lymphadenopathy:     Cervical: No cervical adenopathy.  Skin:    Findings: No erythema or rash.  Neurological:     Cranial Nerves: No cranial nerve deficit.     Motor: No abnormal muscle tone.     Coordination: Coordination normal.     Deep Tendon Reflexes: Reflexes normal.  Psychiatric:        Behavior: Behavior normal.        Thought Content: Thought content normal.        Judgment: Judgment normal.    Obese Deaf   Lab Results  Component Value Date   WBC 14.9 (H) 02/02/2018   HGB 14.7 02/02/2018   HCT 43.2 02/02/2018   PLT 188.0 02/02/2018   GLUCOSE 156 (H) 03/17/2018   CHOL 137 03/10/2015   TRIG 194.0 (H) 03/10/2015   HDL 43.10 03/10/2015   LDLCALC 55 03/10/2015   ALT 34 02/02/2018   AST 51 (H) 02/02/2018   NA 138 03/17/2018   K 3.9 03/17/2018   CL 101 03/17/2018   CREATININE 0.75 03/17/2018   BUN 8 03/17/2018   CO2 30 03/17/2018   TSH 1.14 02/02/2018   HGBA1C 6.9 (H) 02/02/2018    Mm 3d Screen Breast Bilateral  Result Date: 05/27/2018 CLINICAL DATA:  Screening. EXAM: DIGITAL SCREENING BILATERAL MAMMOGRAM WITH TOMO AND CAD COMPARISON:  Previous exam(s). ACR Breast Density Category b: There are scattered areas of fibroglandular density. FINDINGS: There are no findings suspicious for malignancy. Images were processed with CAD. IMPRESSION: No mammographic evidence of malignancy. A result letter of this screening mammogram will be mailed directly to the patient. RECOMMENDATION: Screening  mammogram in one year. (Code:SM-B-01Y) BI-RADS CATEGORY  1: Negative. Electronically Signed   By: Curlene Dolphin M.D.   On: 05/27/2018 16:42    Assessment & Plan:   There are no diagnoses linked to this encounter.   No orders of the defined types were placed in this encounter.    Follow-up: No follow-ups on file.  Walker Kehr, MD

## 2018-12-02 NOTE — Assessment & Plan Note (Signed)
labs

## 2018-12-02 NOTE — Assessment & Plan Note (Signed)
not on Rx

## 2018-12-02 NOTE — Assessment & Plan Note (Signed)
Proventil prn Dr Halford Chessman

## 2018-12-03 ENCOUNTER — Other Ambulatory Visit: Payer: Self-pay | Admitting: Internal Medicine

## 2018-12-03 ENCOUNTER — Encounter: Payer: Self-pay | Admitting: *Deleted

## 2018-12-03 NOTE — Telephone Encounter (Signed)
Patient was previously informed of below and then this rx request came from her pharmacy:  Notes recorded by Plotnikov, Evie Lacks, MD on 12/02/2018 at 11:11 PM EDT  Erline Levine,  Please inform the patient that all labs are stable, except for his slightly decreased potassium. Please take over-the-counter potassium tablets 1 a day for 1 month  Thanks,  AP   Do you want her to refill Kcl 8 meq or take OTC kcl?

## 2019-01-04 ENCOUNTER — Telehealth: Payer: Self-pay | Admitting: Gastroenterology

## 2019-01-04 NOTE — Telephone Encounter (Signed)
Spoke with the mother of the patient. She will have the patient do these things. Appointment tomorrow.

## 2019-01-04 NOTE — Telephone Encounter (Signed)
Mother reports the patient has not had a bowel movement in 4 days, today is day 5. She has a "knot" at her rectum. Complains of abdominal pain. Mother does not know exactly when the patient began having problems again with constipation.  Appointment tomorrow with APP.  Any suggestions for her in the next 24 hours?

## 2019-01-04 NOTE — Telephone Encounter (Signed)
Difficult to say without examining the area.  2 capfuls of MiraLAX in a glass of water this afternoon, 1 capful tomorrow morning.  Sits baths today in case it is hemorrhoids.

## 2019-01-05 ENCOUNTER — Encounter: Payer: Self-pay | Admitting: Gastroenterology

## 2019-01-05 ENCOUNTER — Ambulatory Visit (INDEPENDENT_AMBULATORY_CARE_PROVIDER_SITE_OTHER): Payer: Medicare Other | Admitting: Gastroenterology

## 2019-01-05 ENCOUNTER — Other Ambulatory Visit: Payer: Self-pay

## 2019-01-05 VITALS — BP 110/80 | HR 73 | Temp 98.5°F | Ht 65.0 in | Wt 202.0 lb

## 2019-01-05 DIAGNOSIS — K602 Anal fissure, unspecified: Secondary | ICD-10-CM

## 2019-01-05 DIAGNOSIS — K5909 Other constipation: Secondary | ICD-10-CM | POA: Diagnosis not present

## 2019-01-05 MED ORDER — TRULANCE 3 MG PO TABS: 3.0000 mg | ORAL_TABLET | Freq: Every day | ORAL | 3 refills | Status: DC

## 2019-01-05 MED ORDER — AMBULATORY NON FORMULARY MEDICATION: 0 refills | Status: DC

## 2019-01-05 NOTE — Progress Notes (Signed)
01/05/2019 Heidi Good [id number] 1961-01-05   HISTORY OF PRESENT ILLNESS: This is a 58 year old female who is a patient of Dr. Corena Pilgrim.  She has issues with chronic constipation and has pelvic dyssynergia.  She is here today with complaints of constipation and rectal pain.  At her last visit in October 2019 she was given samples of Trulance 3 mg.  She tells me that the Trulance seemed to help, but when she ran out of samples she stopped taking it.  Recently, however, she began to get very constipated again.  She ended up taking some type of plum or prune pure and MiraLAX and finally began having several bowel movements.  On Sunday when she first had a bowel movement it was very painful in her rectal/anal area.  She has been applying hydrocortisone to the area.  There have been a lot of issues with her trying different medications for constipation.  Linzess apparently caused diarrhea and then she was taking Pepto-Bismol to counteract those effects.  Had a lot of gas and bloating on Sunday after taking the plum/prune puree.  Entire visit today was performed with an interpreter.   Past Medical History:  Diagnosis Date  . Anxiety   . Constipation   . Deafness   . Depression   . Diabetes mellitus ([place])   . Gastroparesis   . Hyperlipemia   . Hypothyroidism    Past Surgical History:  Procedure Laterality Date  . ABDOMINAL HYSTERECTOMY    . ANAL RECTAL MANOMETRY N/A 03/07/2017   Procedure: ANO RECTAL MANOMETRY;  Surgeon: Mauri Pole, MD;  Location: [facility];  Service: Endoscopy;  Laterality: N/A;    reports that she has never smoked. She has never used smokeless tobacco. She reports that she does not drink alcohol or use drugs. family history includes Colon polyps in her mother; Diabetes in her mother; Emphysema in her father and paternal grandfather; Heart attack in her father and maternal grandfather; Heart disease in her father; Liver cancer in her maternal grandmother;  Multiple myeloma in her father. Allergies  Allergen Reactions  . Doxycycline Nausea And Vomiting  . Hydrocodone     Nausea from cough syrup  . Other     Dog, cat, nuts, grass, trees      Outpatient Encounter Medications as of 01/05/2019  Medication Sig  . albuterol (PROVENTIL HFA;VENTOLIN HFA) 108 (90 Base) MCG/ACT inhaler Inhale 1-2 puffs into the lungs every 6 (six) hours as needed for wheezing or shortness of breath.  Marland Kitchen albuterol (PROVENTIL) (2.5 MG/3ML) 0.083% nebulizer solution Take 3 mLs (2.5 mg total) by nebulization every 6 (six) hours as needed for wheezing or shortness of breath.  . ALPRAZolam (XANAX) 0.5 MG tablet Take 1 tablet (0.5 mg total) by mouth daily as needed for anxiety.  . ARIPiprazole (ABILIFY) 15 MG tablet TAKE 1 TABLET(15 MG) BY MOUTH DAILY  . Cholecalciferol (VITAMIN D3) 2000 units capsule Take 1 capsule (2,000 Units total) by mouth daily.  . diclofenac (VOLTAREN) 75 MG EC tablet Take 1 tablet (75 mg total) by mouth 2 (two) times daily.  . ergocalciferol (VITAMIN D2) 1.25 MG (50000 UT) capsule Take 1 capsule (50,000 Units total) by mouth every 30 (thirty) days.  Marland Kitchen erythromycin ophthalmic ointment Place 1 application into the left eye at bedtime. L eye qhs x 5 days  . estradiol (ESTRACE) 1 MG tablet Take 1 tablet (1 mg total) by mouth daily.  . fluticasone (FLOVENT HFA) 110 MCG/ACT inhaler Inhale 2 puffs  into the lungs 2 (two) times daily.  . Fluticasone Propionate, Inhal, (FLOVENT DISKUS) 100 MCG/BLIST AEPB Inhale 1 Inhaler into the lungs 2 (two) times a day.  . levothyroxine (SYNTHROID, LEVOTHROID) 75 MCG tablet Take 1 tablet (75 mcg total) by mouth daily.  Marland Kitchen loratadine (CLARITIN) 10 MG tablet Take 1 tablet (10 mg total) by mouth daily.  Marland Kitchen NITRO-BID 2 % ointment Place 1 application rectally. As needed  . Olopatadine HCl (PATADAY) 0.2 % SOLN Apply to eye.  Marland Kitchen Plecanatide (TRULANCE) 3 MG TABS Take 3 mg by mouth daily.  . potassium chloride (KLOR-CON) 8 MEQ tablet  TAKE 1 TABLET(8 MEQ) BY MOUTH DAILY   No facility-administered encounter medications on file as of 01/05/2019.      REVIEW OF SYSTEMS  : All other systems reviewed and negative except where noted in the History of Present Illness.   PHYSICAL EXAM: BP 110/80 (BP Location: Left Arm, Patient Position: Sitting, Cuff Size: Normal)   Pulse 73   Temp 98.5 F (36.9 C)   Ht '5\' 5"'$  (1.651 m)   Wt 202 lb (91.6 kg)   BMI 33.61 kg/m  General: Well developed white female in no acute distress Head: Normocephalic and atraumatic Eyes:  Sclerae anicteric, conjunctiva pink. Ears: Normal auditory acuity Lungs: Clear throughout to auscultation; no increased WOB. Heart: Regular rate and rhythm; no M/R/G. Abdomen: Soft, non-distended.  BS present.  Non-tender. Rectal:  No external abnormalities noted.  DRE was painful.  No stool on exam glove.  No masses felt.  Anoscopy not performed due to discomfort. Musculoskeletal: Symmetrical with no gross deformities  Skin: No lesions on visible extremities Extremities: No edema  Neurological: Alert oriented x 4, grossly non-focal Psychological:  Alert and cooperative. Normal mood and affect  ASSESSMENT AND PLAN: *Anal fissure: No definite fissure seen by exam today, but exam was very painful for the patient.  Will treat empirically for such with nitro gel twice daily for the next 8 to 10 weeks.  Needs to avoid constipation. *Chronic constipation:  Says that previously Trulance 30 mg samples helped her constipation.  Will send prescription.  **Will follow-up in approximately 6 weeks.   CC:  Plotnikov, Evie Lacks, MD

## 2019-01-05 NOTE — Patient Instructions (Signed)
We have sent a prescription for nitroglycerin 0.125% gel to [facility]. You should apply a pea size amount to your rectum twice daily 8 weeks.  [facility]'s information is below: Address: [address], [place], [place] [address]  [phone]  *Please DO NOT go directly from our office to pick up this medication! Give the pharmacy 1 day to process the prescription as this is compounded and takes time to make.  We have sent the following medications to your regular pharmacy for you to pick up at your convenience: Trulance

## 2019-01-06 ENCOUNTER — Telehealth: Payer: Self-pay

## 2019-01-06 NOTE — Telephone Encounter (Signed)
PA request for Trulance has been approved and is valid from 12/07/18 through 01/06/20.

## 2019-01-07 NOTE — Progress Notes (Signed)
____________________________________________________________  Attending physician addendum:  Thank you for sending this case to me. I have reviewed the entire note, and the outlined plan seems appropriate.  I also recommend having her use Recticare ointment as well since fissure suspected.  Wilfrid Lund, MD  ____________________________________________________________

## 2019-01-14 ENCOUNTER — Ambulatory Visit (INDEPENDENT_AMBULATORY_CARE_PROVIDER_SITE_OTHER): Payer: Medicare Other

## 2019-01-14 ENCOUNTER — Other Ambulatory Visit: Payer: Self-pay

## 2019-01-14 DIAGNOSIS — Z23 Encounter for immunization: Secondary | ICD-10-CM

## 2019-01-14 NOTE — Progress Notes (Signed)
I have communicated all questions about flu vaccine with written communication between patient and myself with IN office nurse visit to get flu vaccine---VIS sheet was sent home with patient

## 2019-01-15 ENCOUNTER — Ambulatory Visit: Payer: Medicare Other

## 2019-01-16 ENCOUNTER — Ambulatory Visit: Payer: Medicare Other

## 2019-02-16 ENCOUNTER — Encounter: Payer: Self-pay | Admitting: Gastroenterology

## 2019-02-16 ENCOUNTER — Ambulatory Visit (INDEPENDENT_AMBULATORY_CARE_PROVIDER_SITE_OTHER): Payer: Medicare Other | Admitting: Gastroenterology

## 2019-02-16 VITALS — BP 126/82 | HR 83 | Temp 98.7°F | Ht 65.0 in | Wt 202.5 lb

## 2019-02-16 DIAGNOSIS — K5909 Other constipation: Secondary | ICD-10-CM

## 2019-02-16 DIAGNOSIS — K5902 Outlet dysfunction constipation: Secondary | ICD-10-CM | POA: Diagnosis not present

## 2019-02-16 DIAGNOSIS — K602 Anal fissure, unspecified: Secondary | ICD-10-CM | POA: Diagnosis not present

## 2019-02-16 NOTE — Progress Notes (Signed)
[facility] Progress Note  Chief Complaint: Anal fissure and chronic constipation  Subjective  History: Hearing impaired.  Pelvic floor dysfunction, has had difficulty participating in biofeedback.  At some point she was on Trulance (having previously not tolerated Linzess due to diarrhea), but then ran out of that med so stopped taking it.  She was seen in clinic in late August with pain and swelling felt likely to be an anal fissure due to the degree of discomfort.  She was treated with NTG and RectiCare ointments and Trulance was resumed.  Heidi Good was seen with sign language interpreter Juliann Pulse, and her medical assistant was present for the physical exam.  The anorectal pain has stopped, and she did follow through with twice daily use of the nitroglycerin ointment for about 8 weeks. Constipation seems improved as well, and she only seems to need the Trulance a couple times a week.  We did review her previous testing for pelvic floor dysfunction and biofeedback training.  ROS: Cardiovascular:  no chest pain Respiratory: no dyspnea  The patient's Past Medical, Family and Social History were reviewed and are on file in the EMR.  Objective:  Med list reviewed  Current Outpatient Medications:  .  albuterol (PROVENTIL HFA;VENTOLIN HFA) 108 (90 Base) MCG/ACT inhaler, Inhale 1-2 puffs into the lungs every 6 (six) hours as needed for wheezing or shortness of breath., Disp: 1 Inhaler, Rfl: 5 .  albuterol (PROVENTIL) (2.5 MG/3ML) 0.083% nebulizer solution, Take 3 mLs (2.5 mg total) by nebulization every 6 (six) hours as needed for wheezing or shortness of breath., Disp: 75 mL, Rfl: 5 .  ALPRAZolam (XANAX) 0.5 MG tablet, Take 1 tablet (0.5 mg total) by mouth daily as needed for anxiety., Disp: 30 tablet, Rfl: 1 .  AMBULATORY NON FORMULARY MEDICATION, Medication Name: Nitroglycerin gel 0.125 % apply pea size amount to rectum twice daily x 8 weeks, Disp: 30 g, Rfl: 0 .  ARIPiprazole  (ABILIFY) 15 MG tablet, TAKE 1 TABLET(15 MG) BY MOUTH DAILY, Disp: 30 tablet, Rfl: 5 .  Cholecalciferol (VITAMIN D3) 2000 units capsule, Take 1 capsule (2,000 Units total) by mouth daily., Disp: 100 capsule, Rfl: 3 .  diclofenac sodium (VOLTAREN) 1 % GEL, Apply topically at bedtime., Disp: , Rfl:  .  ergocalciferol (VITAMIN D2) 1.25 MG (50000 UT) capsule, Take 1 capsule (50,000 Units total) by mouth every 30 (thirty) days., Disp: 6 capsule, Rfl: 1 .  erythromycin ophthalmic ointment, Place 1 application into the left eye at bedtime. L eye qhs x 5 days, Disp: 3.5 g, Rfl: 0 .  estradiol (ESTRACE) 1 MG tablet, Take 1 tablet (1 mg total) by mouth daily., Disp: 90 tablet, Rfl: 3 .  fluticasone (FLOVENT HFA) 110 MCG/ACT inhaler, Inhale 2 puffs into the lungs 2 (two) times daily., Disp: 1 Inhaler, Rfl: 12 .  Fluticasone Propionate, Inhal, (FLOVENT DISKUS) 100 MCG/BLIST AEPB, Inhale 1 Inhaler into the lungs 2 (two) times a day., Disp: 3 each, Rfl: 3 .  levothyroxine (SYNTHROID, LEVOTHROID) 75 MCG tablet, Take 1 tablet (75 mcg total) by mouth daily., Disp: 90 tablet, Rfl: 3 .  loratadine (CLARITIN) 10 MG tablet, Take 1 tablet (10 mg total) by mouth daily., Disp: 90 tablet, Rfl: 3 .  NITRO-BID 2 % ointment, Place 1 application rectally. As needed, Disp: , Rfl:  .  Olopatadine HCl (PATADAY) 0.2 % SOLN, Apply to eye., Disp: , Rfl:  .  Plecanatide (TRULANCE) 3 MG TABS, Take 3 mg by mouth daily., Disp: 30  tablet, Rfl: 3 .  potassium chloride (KLOR-CON) 8 MEQ tablet, TAKE 1 TABLET(8 MEQ) BY MOUTH DAILY, Disp: 90 tablet, Rfl: 3   Vital signs in last 24 hrs: Vitals:   02/16/19 1520  BP: 126/82  Pulse: 83  Temp: 98.7 F (37.1 C)    Physical Exam   Cardiac: RRR without murmurs, S1S2 heard, no peripheral edema  Pulm: clear to auscultation bilaterally, normal RR and effort noted  Abdomen: soft, no tenderness, with active bowel sounds. No guarding or palpable hepatosplenomegaly.  Rectal: Normal perianal  exam.  No tenderness or palpable fissure or internal lesions.   @ASSESSMENTPLANBEGIN @ Assessment: Encounter Diagnoses  Name Primary?  . Chronic constipation Yes  . Anal fissure   . Dyssynergic defecation    Chronic constipation from dyssynergic defecation.  This led to an anal fissure that has now healed with therapy.  Bowel habits more regular lately.   Plan: Since she has a lot of the nitroglycerin ointment left, and we know she has dyssynergic defecation, I recommended she use a small amount on the anal canal along with the oral Trulance when she has difficulty with bowel movements. See me as needed.  Total time 15 minutes, over half spent face-to-face with patient in counseling and coordination of care.   Nelida Meuse III

## 2019-02-16 NOTE — Patient Instructions (Signed)
If you are age 57 or older, your body mass index should be between 23-30. Your Body mass index is 33.7 kg/m. If this is out of the aforementioned range listed, please consider follow up with your Primary Care Provider.  If you are age 37 or younger, your body mass index should be between 19-25. Your Body mass index is 33.7 kg/m. If this is out of the aformentioned range listed, please consider follow up with your Primary Care Provider.   Follow up as needed. [phone]  It was a pleasure to see you today!  Dr. Loletha Carrow

## 2019-03-31 ENCOUNTER — Encounter: Payer: Self-pay | Admitting: Psychiatry

## 2019-03-31 ENCOUNTER — Other Ambulatory Visit: Payer: Self-pay

## 2019-03-31 ENCOUNTER — Ambulatory Visit (INDEPENDENT_AMBULATORY_CARE_PROVIDER_SITE_OTHER): Payer: Medicare Other | Admitting: Psychiatry

## 2019-03-31 DIAGNOSIS — F99 Mental disorder, not otherwise specified: Secondary | ICD-10-CM

## 2019-03-31 DIAGNOSIS — F5105 Insomnia due to other mental disorder: Secondary | ICD-10-CM

## 2019-03-31 DIAGNOSIS — F200 Paranoid schizophrenia: Secondary | ICD-10-CM | POA: Diagnosis not present

## 2019-03-31 MED ORDER — ARIPIPRAZOLE 15 MG PO TABS: ORAL_TABLET | ORAL | 1 refills | Status: DC

## 2019-03-31 NOTE — Progress Notes (Signed)
Heidi Good [id number] Jun 21, 1960 58 y.o.  Subjective:   Patient ID:  Heidi Good is a 58 y.o. (DOB Dec 15, 1960) female.  Chief Complaint:  Chief Complaint  Patient presents with  . Follow-up    Medication Management  . Paranoid    Medication Management  . Depression    Medication Management    Depression        Associated symptoms include no decreased concentration and no suicidal ideas.  patient is deaf and seen with an interpreter.  She has notes. Monigue Good [facility] presents to the office today for follow-up of schizophrenia.  Last seen June , 2020.  She continues on Abilify 15 mg daily and Xanax is the only psychiatric meds and no meds were changed.  Patient seen today the with the assistance of a translator due to her deafness.  Feeling good.  No problems with meds.  Problems with mother.  Behavior is changing andnot thinking straight.  48 M to PCP and they can't help her.  M gets confused and sleeping a lot. B comes and helps.  No longer has funny feelings and doesn't need the Xanax.  Denies feeling afraid at home.  B will move in when M passes away.  Has several dogs.  [place] Shepherds make her feel safer.  Abilify 15 mg increase helped her.     In November bothered bc could see something and feel something walk by outside and dog acted funny.  Then brother came over and checked things.  She felt someone was walking around outside.  Says this happened 7 times by mid DEC.  She feels in danger from this incidents.  Never saw anyone but believed there was someone there.  Police came by and didn't find anything.  Thinks it's an old friend.  M having problems with dementia.  Pt takes care of her.  Pt helps her with meds and cooks for her and helps bathe hers.  Rare Xanax is helpful.  Patient reports stable mood and denies depressed or irritable moods.  Patient denies any recent difficulty with anxiety except as noted. No fear.  Patient denies difficulty with sleep initiation  or maintenance. Denies appetite disturbance.  Patient reports that energy and motivation have been good.  Patient denies any difficulty with concentration.  Patient denies any suicidal ideation.  15 mg Abilify helped her feel better, calmer than did the 10 mg Abilify.  No SE and claims compliance.  Denies voices, depression, anger on this dosage.  Sleep good 8 hours.  No drowsiness. Watches news.  Other psychiatric medications tried include perphenazine which she did not like, loxapine which she did not like, and Saphris.  She complained that it gave her strange feeling under the tongue.  She has taken other psychiatric medications in the past but she does not know the names of them and we have not been able to get the records. Abilify 15 benefit.  Review of Systems:  Review of Systems  HENT:       Deaf  Respiratory: Negative for cough and shortness of breath.   Neurological: Negative for tremors and weakness.  Psychiatric/Behavioral: Positive for depression. Negative for agitation, behavioral problems, confusion, decreased concentration, dysphoric mood, hallucinations, self-injury, sleep disturbance and suicidal ideas. The patient is nervous/anxious. The patient is not hyperactive.     Medications: I have reviewed the patient's current medications.  Current Outpatient Medications  Medication Sig Dispense Refill  . albuterol (PROVENTIL HFA;VENTOLIN HFA) 108 (90 Base) MCG/ACT inhaler Inhale  1-2 puffs into the lungs every 6 (six) hours as needed for wheezing or shortness of breath. 1 Inhaler 5  . albuterol (PROVENTIL) (2.5 MG/3ML) 0.083% nebulizer solution Take 3 mLs (2.5 mg total) by nebulization every 6 (six) hours as needed for wheezing or shortness of breath. 75 mL 5  . ALPRAZolam (XANAX) 0.5 MG tablet Take 1 tablet (0.5 mg total) by mouth daily as needed for anxiety. 30 tablet 1  . AMBULATORY NON FORMULARY MEDICATION Medication Name: Nitroglycerin gel 0.125 % apply pea size amount to rectum  twice daily x 8 weeks 30 g 0  . ARIPiprazole (ABILIFY) 15 MG tablet TAKE 1 TABLET(15 MG) BY MOUTH DAILY 90 tablet 1  . Cholecalciferol (VITAMIN D3) 2000 units capsule Take 1 capsule (2,000 Units total) by mouth daily. 100 capsule 3  . diclofenac sodium (VOLTAREN) 1 % GEL Apply topically at bedtime.    . ergocalciferol (VITAMIN D2) 1.25 MG (50000 UT) capsule Take 1 capsule (50,000 Units total) by mouth every 30 (thirty) days. 6 capsule 1  . erythromycin ophthalmic ointment Place 1 application into the left eye at bedtime. L eye qhs x 5 days 3.5 g 0  . estradiol (ESTRACE) 1 MG tablet Take 1 tablet (1 mg total) by mouth daily. 90 tablet 3  . fluticasone (FLOVENT HFA) 110 MCG/ACT inhaler Inhale 2 puffs into the lungs 2 (two) times daily. 1 Inhaler 12  . Fluticasone Propionate, Inhal, (FLOVENT DISKUS) 100 MCG/BLIST AEPB Inhale 1 Inhaler into the lungs 2 (two) times a day. 3 each 3  . levothyroxine (SYNTHROID, LEVOTHROID) 75 MCG tablet Take 1 tablet (75 mcg total) by mouth daily. 90 tablet 3  . loratadine (CLARITIN) 10 MG tablet Take 1 tablet (10 mg total) by mouth daily. 90 tablet 3  . NITRO-BID 2 % ointment Place 1 application rectally. As needed    . Olopatadine HCl (PATADAY) 0.2 % SOLN Apply to eye.    Marland Kitchen Plecanatide (TRULANCE) 3 MG TABS Take 3 mg by mouth daily. 30 tablet 3  . potassium chloride (KLOR-CON) 8 MEQ tablet TAKE 1 TABLET(8 MEQ) BY MOUTH DAILY 90 tablet 3   No current facility-administered medications for this visit.     Medication Side Effects: None  Allergies:  Allergies  Allergen Reactions  . Doxycycline Nausea And Vomiting  . Hydrocodone     Nausea from cough syrup  . Other     Dog, cat, nuts, grass, trees    Past Medical History:  Diagnosis Date  . Anxiety   . Constipation   . Deafness   . Depression   . Diabetes mellitus ([place])   . Gastroparesis   . Hyperlipemia   . Hypothyroidism     Family History  Problem Relation Age of Onset  . Diabetes Mother   .  Colon polyps Mother        over 150 polyps detected  . Heart disease Father   . Multiple myeloma Father   . Emphysema Father   . Heart attack Father   . Liver cancer Maternal Grandmother        not primary source, spread to her liver  . Heart attack Maternal Grandfather   . Emphysema Paternal Grandfather   . Colon cancer Neg Hx   . Esophageal cancer Neg Hx   . Rectal cancer Neg Hx   . Stomach cancer Neg Hx     Social History   Socioeconomic History  . Marital status: Divorced    Spouse name: Not on file  .  Number of children: 0  . Years of education: Not on file  . Highest education level: Not on file  Occupational History  . Occupation: disablied    Employer: RETIRED  Social Needs  . Financial resource strain: Not on file  . Food insecurity    Worry: Not on file    Inability: Not on file  . Transportation needs    Medical: Not on file    Non-medical: Not on file  Tobacco Use  . Smoking status: Never Smoker  . Smokeless tobacco: Never Used  Substance and Sexual Activity  . Alcohol use: Never    Frequency: Never  . Drug use: Never  . Sexual activity: Not on file  Lifestyle  . Physical activity    Days per week: Not on file    Minutes per session: Not on file  . Stress: Not on file  Relationships  . Social Herbalist on phone: Not on file    Gets together: Not on file    Attends religious service: Not on file    Active member of club or organization: Not on file    Attends meetings of clubs or organizations: Not on file    Relationship status: Not on file  . Intimate partner violence    Fear of current or ex partner: Not on file    Emotionally abused: Not on file    Physically abused: Not on file    Forced sexual activity: Not on file  Other Topics Concern  . Not on file  Social History Narrative  . Not on file   She drives.  Still lives with her neighbor.  Past Medical History, Surgical history, Social history, and Family history were  reviewed and updated as appropriate.   Please see review of systems for further details on the patient's review from today.   Objective:   Physical Exam:  There were no vitals taken for this visit.  Physical Exam Constitutional:      General: She is not in acute distress.    Appearance: She is well-developed.  Musculoskeletal:        General: No deformity.  Neurological:     Mental Status: She is alert and oriented to person, place, and time.     Motor: No tremor.     Coordination: Coordination normal.     Gait: Gait normal.  Psychiatric:        Attention and Perception: She is attentive. She does not perceive auditory hallucinations.        Mood and Affect: Mood is not anxious or depressed. Affect is not labile, blunt, angry or inappropriate.        Speech: Speech normal.        Behavior: Behavior normal. Behavior is not agitated, aggressive or hyperactive. Behavior is cooperative.        Thought Content: Thought content is paranoid. Thought content does not include homicidal or suicidal ideation. Thought content does not include homicidal or suicidal plan.        Cognition and Memory: Cognition normal.        Judgment: Judgment normal.     Comments: Insight poor.  No auditory or visual hallucinations. No delusions.  Pleasant     Lab Review:     Component Value Date/Time   NA 136 12/02/2018 1429   K 3.3 (L) 12/02/2018 1429   CL 98 12/02/2018 1429   CO2 27 12/02/2018 1429   GLUCOSE 84 12/02/2018 1429  BUN 6 12/02/2018 1429   CREATININE 0.70 12/02/2018 1429   CALCIUM 9.2 12/02/2018 1429   PROT 7.5 02/02/2018 1619   ALBUMIN 3.6 02/02/2018 1619   AST 51 (H) 02/02/2018 1619   ALT 34 02/02/2018 1619   ALKPHOS 117 02/02/2018 1619   BILITOT 0.7 02/02/2018 1619   GFRNONAA 95 07/11/2008 1616   GFRAA 115 07/11/2008 1616       Component Value Date/Time   WBC 14.9 (H) 02/02/2018 1619   RBC 4.78 02/02/2018 1619   HGB 14.7 02/02/2018 1619   HCT 43.2 02/02/2018 1619    PLT 188.0 02/02/2018 1619   MCV 90.3 02/02/2018 1619   MCHC 34.0 02/02/2018 1619   RDW 13.1 02/02/2018 1619   LYMPHSABS 4.7 (H) 02/02/2018 1619   MONOABS 1.1 (H) 02/02/2018 1619   EOSABS 0.5 02/02/2018 1619   BASOSABS 0.1 02/02/2018 1619    No results found for: POCLITH, LITHIUM   No results found for: PHENYTOIN, PHENOBARB, VALPROATE, CBMZ   .res Assessment: Plan:    Reata was seen today for follow-up, paranoid and depression.  Diagnoses and all orders for this visit:  Paranoid schizophrenia ([place])  Insomnia due to other mental disorder  Other orders -     ARIPiprazole (ABILIFY) 15 MG tablet; TAKE 1 TABLET(15 MG) BY MOUTH DAILY    Greater than 50% of face to face time with patient and an interpretor for 30 mins was spent on counseling and coordination of care. We discussed potential metabolic side effects associated with atypical antipsychotics, as well as potential risk for movement side effects. Advised pt to contact office if movement side effects occur.   Pt has had a relapse of paranoid psychosis. Offered option of increasing the Abilify.  Refuses increases.  Watches TV, cares for mother, no anger problems.  She is not agitated nor hostile though she is paranoid.  She denies being on a great deal of fear but the situation does make her nervous.  She is able to function normally.  She is agreed to continue Abilify 15 mg which she has taken for many years in the past to good effect.  She had a brief period of time where she was convinced it was not the right medicine for her.  We were not able to find any other medications that she felt satisfied taking.  It does not not appear that she has active psychosis at the moment.  She has a history of repeated paranoid psychotic episodes.  During some of these she could become agitated and aggressive with her mother.  She agrees that this is a good medicine for her  Her labs are followed by her primary care doctor. Normal glucose and  Hgb A1C.  No med changes today.   This appointment was 30 minutes  Follow-up 5 months.  She was encouraged to call if she decided to increase the medication before the next appointment.  Lynder Parents MD, DFAPA  Please see After Visit Summary for patient specific instructions.  Future Appointments  Date Time Provider [place]  06/07/2019  2:00 PM Plotnikov, Evie Lacks, MD [facility]  09/29/2019  1:00 PM Cottle, Billey Co., MD [facility] None    No orders of the defined types were placed in this encounter.     -------------------------------

## 2019-06-07 ENCOUNTER — Other Ambulatory Visit (INDEPENDENT_AMBULATORY_CARE_PROVIDER_SITE_OTHER): Payer: Medicare Other

## 2019-06-07 ENCOUNTER — Other Ambulatory Visit: Payer: Self-pay

## 2019-06-07 ENCOUNTER — Encounter: Payer: Self-pay | Admitting: Internal Medicine

## 2019-06-07 ENCOUNTER — Ambulatory Visit (INDEPENDENT_AMBULATORY_CARE_PROVIDER_SITE_OTHER): Payer: Medicare Other | Admitting: Internal Medicine

## 2019-06-07 DIAGNOSIS — R600 Localized edema: Secondary | ICD-10-CM

## 2019-06-07 DIAGNOSIS — E034 Atrophy of thyroid (acquired): Secondary | ICD-10-CM

## 2019-06-07 DIAGNOSIS — F5105 Insomnia due to other mental disorder: Secondary | ICD-10-CM

## 2019-06-07 DIAGNOSIS — F99 Mental disorder, not otherwise specified: Secondary | ICD-10-CM

## 2019-06-07 DIAGNOSIS — R609 Edema, unspecified: Secondary | ICD-10-CM | POA: Insufficient documentation

## 2019-06-07 LAB — CBC WITH DIFFERENTIAL/PLATELET
Basophils Absolute: 0.1 10*3/uL (ref 0.0–0.1)
Basophils Relative: 0.9 % (ref 0.0–3.0)
Eosinophils Absolute: 0.2 10*3/uL (ref 0.0–0.7)
Eosinophils Relative: 1.4 % (ref 0.0–5.0)
HCT: 44.5 % (ref 36.0–46.0)
Hemoglobin: 14.7 g/dL (ref 12.0–15.0)
Lymphocytes Relative: 25.4 % (ref 12.0–46.0)
Lymphs Abs: 3.5 10*3/uL (ref 0.7–4.0)
MCHC: 33.2 g/dL (ref 30.0–36.0)
MCV: 89.8 fl (ref 78.0–100.0)
Monocytes Absolute: 0.8 10*3/uL (ref 0.1–1.0)
Monocytes Relative: 5.5 % (ref 3.0–12.0)
Neutro Abs: 9.3 10*3/uL — ABNORMAL HIGH (ref 1.4–7.7)
Neutrophils Relative %: 66.8 % (ref 43.0–77.0)
Platelets: 176 10*3/uL (ref 150.0–400.0)
RBC: 4.95 Mil/uL (ref 3.87–5.11)
RDW: 13.4 % (ref 11.5–15.5)
WBC: 13.8 10*3/uL — ABNORMAL HIGH (ref 4.0–10.5)

## 2019-06-07 LAB — HEPATIC FUNCTION PANEL
ALT: 28 U/L (ref 0–35)
AST: 38 U/L — ABNORMAL HIGH (ref 0–37)
Albumin: 3.7 g/dL (ref 3.5–5.2)
Alkaline Phosphatase: 106 U/L (ref 39–117)
Bilirubin, Direct: 0.2 mg/dL (ref 0.0–0.3)
Total Bilirubin: 1 mg/dL (ref 0.2–1.2)
Total Protein: 7.2 g/dL (ref 6.0–8.3)

## 2019-06-07 LAB — BASIC METABOLIC PANEL
BUN: 8 mg/dL (ref 6–23)
CO2: 27 mEq/L (ref 19–32)
Calcium: 9.4 mg/dL (ref 8.4–10.5)
Chloride: 99 mEq/L (ref 96–112)
Creatinine, Ser: 0.71 mg/dL (ref 0.40–1.20)
GFR: 84.56 mL/min (ref >60.00)
Glucose, Bld: 95 mg/dL (ref 70–99)
Potassium: 3.8 mEq/L (ref 3.5–5.1)
Sodium: 136 mEq/L (ref 135–145)

## 2019-06-07 LAB — TSH: TSH: 1.1 u[IU]/mL (ref 0.35–4.50)

## 2019-06-07 MED ORDER — ESTRADIOL 1 MG PO TABS: 1.0000 mg | ORAL_TABLET | Freq: Every day | ORAL | 3 refills | Status: DC

## 2019-06-07 MED ORDER — FUROSEMIDE 20 MG PO TABS: 20.0000 mg | ORAL_TABLET | Freq: Every day | ORAL | 3 refills | Status: DC | PRN

## 2019-06-07 MED ORDER — POTASSIUM CHLORIDE ER 8 MEQ PO TBCR: EXTENDED_RELEASE_TABLET | ORAL | 3 refills | Status: DC

## 2019-06-07 MED ORDER — LEVOTHYROXINE SODIUM 75 MCG PO TABS: 75.0000 ug | ORAL_TABLET | Freq: Every day | ORAL | 3 refills | Status: DC

## 2019-06-07 NOTE — Assessment & Plan Note (Signed)
Sleeps w/mom

## 2019-06-07 NOTE — Assessment & Plan Note (Signed)
furosemide prn NAS diet Labs

## 2019-06-07 NOTE — Assessment & Plan Note (Signed)
Labs

## 2019-06-07 NOTE — Progress Notes (Signed)
Subjective:  Patient ID: Heidi Good, female    DOB: 1961-04-16  Age: 59 y.o. MRN: [id number]  CC: No chief complaint on file.   HPI Heidi Good presents for feet swelling  X 1 day, resolved in am F/u hypothyroidism, hypokalemia  Outpatient Medications Prior to Visit  Medication Sig Dispense Refill  . albuterol (PROVENTIL HFA;VENTOLIN HFA) 108 (90 Base) MCG/ACT inhaler Inhale 1-2 puffs into the lungs every 6 (six) hours as needed for wheezing or shortness of breath. 1 Inhaler 5  . albuterol (PROVENTIL) (2.5 MG/3ML) 0.083% nebulizer solution Take 3 mLs (2.5 mg total) by nebulization every 6 (six) hours as needed for wheezing or shortness of breath. 75 mL 5  . ALPRAZolam (XANAX) 0.5 MG tablet Take 1 tablet (0.5 mg total) by mouth daily as needed for anxiety. 30 tablet 1  . AMBULATORY NON FORMULARY MEDICATION Medication Name: Nitroglycerin gel 0.125 % apply pea size amount to rectum twice daily x 8 weeks 30 g 0  . ARIPiprazole (ABILIFY) 15 MG tablet TAKE 1 TABLET(15 MG) BY MOUTH DAILY 90 tablet 1  . Cholecalciferol (VITAMIN D3) 2000 units capsule Take 1 capsule (2,000 Units total) by mouth daily. 100 capsule 3  . diclofenac sodium (VOLTAREN) 1 % GEL Apply topically at bedtime.    . ergocalciferol (VITAMIN D2) 1.25 MG (50000 UT) capsule Take 1 capsule (50,000 Units total) by mouth every 30 (thirty) days. 6 capsule 1  . erythromycin ophthalmic ointment Place 1 application into the left eye at bedtime. L eye qhs x 5 days 3.5 g 0  . estradiol (ESTRACE) 1 MG tablet Take 1 tablet (1 mg total) by mouth daily. 90 tablet 3  . fluticasone (FLOVENT HFA) 110 MCG/ACT inhaler Inhale 2 puffs into the lungs 2 (two) times daily. 1 Inhaler 12  . Fluticasone Propionate, Inhal, (FLOVENT DISKUS) 100 MCG/BLIST AEPB Inhale 1 Inhaler into the lungs 2 (two) times a day. 3 each 3  . levothyroxine (SYNTHROID, LEVOTHROID) 75 MCG tablet Take 1 tablet (75 mcg total) by mouth daily. 90 tablet 3  . loratadine  (CLARITIN) 10 MG tablet Take 1 tablet (10 mg total) by mouth daily. 90 tablet 3  . NITRO-BID 2 % ointment Place 1 application rectally. As needed    . Olopatadine HCl (PATADAY) 0.2 % SOLN Apply to eye.    Marland Kitchen Plecanatide (TRULANCE) 3 MG TABS Take 3 mg by mouth daily. 30 tablet 3  . potassium chloride (KLOR-CON) 8 MEQ tablet TAKE 1 TABLET(8 MEQ) BY MOUTH DAILY 90 tablet 3   No facility-administered medications prior to visit.    ROS: Review of Systems  Constitutional: Negative for activity change, appetite change, chills, fatigue and unexpected weight change.  HENT: Negative for congestion, mouth sores and sinus pressure.   Eyes: Negative for visual disturbance.  Respiratory: Negative for cough, chest tightness and shortness of breath.   Cardiovascular: Positive for leg swelling.  Gastrointestinal: Negative for abdominal pain and nausea.  Genitourinary: Negative for difficulty urinating, frequency and vaginal pain.  Musculoskeletal: Negative for back pain and gait problem.  Skin: Negative for pallor and rash.  Neurological: Negative for dizziness, tremors, weakness, numbness and headaches.  Psychiatric/Behavioral: Negative for confusion and sleep disturbance.    Objective:  BP 126/80 (BP Location: Left Arm, Patient Position: Sitting, Cuff Size: Large)   Pulse 64   Temp 99.1 F (37.3 C) (Oral)   Ht 5\' 5"  (1.651 m)   Wt 198 lb (89.8 kg)   SpO2 96%  BMI 32.95 kg/m   BP Readings from Last 3 Encounters:  06/07/19 126/80  02/16/19 126/82  01/05/19 110/80    Wt Readings from Last 3 Encounters:  06/07/19 198 lb (89.8 kg)  02/16/19 202 lb 8 oz (91.9 kg)  01/05/19 202 lb (91.6 kg)    Physical Exam Constitutional:      General: She is not in acute distress.    Appearance: She is well-developed.  HENT:     Head: Normocephalic.     Right Ear: External ear normal.     Left Ear: External ear normal.     Nose: Nose normal.  Eyes:     General:        Right eye: No discharge.         Left eye: No discharge.     Conjunctiva/sclera: Conjunctivae normal.     Pupils: Pupils are equal, round, and reactive to light.  Neck:     Thyroid: No thyromegaly.     Vascular: No JVD.     Trachea: No tracheal deviation.  Cardiovascular:     Rate and Rhythm: Normal rate and regular rhythm.     Heart sounds: Normal heart sounds.  Pulmonary:     Effort: No respiratory distress.     Breath sounds: No stridor. No wheezing.  Abdominal:     General: Bowel sounds are normal. There is no distension.     Palpations: Abdomen is soft. There is no mass.     Tenderness: There is no abdominal tenderness. There is no guarding or rebound.  Musculoskeletal:        General: No tenderness.     Cervical back: Normal range of motion and neck supple.  Lymphadenopathy:     Cervical: No cervical adenopathy.  Skin:    Findings: No erythema or rash.  Neurological:     Cranial Nerves: No cranial nerve deficit.     Motor: No abnormal muscle tone.     Coordination: Coordination normal.     Deep Tendon Reflexes: Reflexes normal.  Psychiatric:        Behavior: Behavior normal.        Thought Content: Thought content normal.        Judgment: Judgment normal.   occ irreg beats  Lab Results  Component Value Date   WBC 14.9 (H) 02/02/2018   HGB 14.7 02/02/2018   HCT 43.2 02/02/2018   PLT 188.0 02/02/2018   GLUCOSE 84 12/02/2018   CHOL 137 03/10/2015   TRIG 194.0 (H) 03/10/2015   HDL 43.10 03/10/2015   LDLCALC 55 03/10/2015   ALT 34 02/02/2018   AST 51 (H) 02/02/2018   NA 136 12/02/2018   K 3.3 (L) 12/02/2018   CL 98 12/02/2018   CREATININE 0.70 12/02/2018   BUN 6 12/02/2018   CO2 27 12/02/2018   TSH 1.14 02/02/2018   HGBA1C 6.0 12/02/2018    MM 3D SCREEN BREAST BILATERAL  Result Date: 05/27/2018 CLINICAL DATA:  Screening. EXAM: DIGITAL SCREENING BILATERAL MAMMOGRAM WITH TOMO AND CAD COMPARISON:  Previous exam(s). ACR Breast Density Category b: There are scattered areas of  fibroglandular density. FINDINGS: There are no findings suspicious for malignancy. Images were processed with CAD. IMPRESSION: No mammographic evidence of malignancy. A result letter of this screening mammogram will be mailed directly to the patient. RECOMMENDATION: Screening mammogram in one year. (Code:SM-B-01Y) BI-RADS CATEGORY  1: Negative. Electronically Signed   By: Curlene Dolphin M.D.   On: 05/27/2018 16:42    Assessment &  Plan:    Alex Emmalou Hunger, MD 

## 2019-07-12 ENCOUNTER — Other Ambulatory Visit: Payer: Self-pay | Admitting: Internal Medicine

## 2019-07-22 ENCOUNTER — Other Ambulatory Visit: Payer: Self-pay | Admitting: Internal Medicine

## 2019-07-22 DIAGNOSIS — Z1231 Encounter for screening mammogram for malignant neoplasm of breast: Secondary | ICD-10-CM

## 2019-07-25 ENCOUNTER — Other Ambulatory Visit: Payer: Self-pay | Admitting: Internal Medicine

## 2019-08-18 ENCOUNTER — Other Ambulatory Visit: Payer: Self-pay

## 2019-08-18 ENCOUNTER — Ambulatory Visit
Admission: RE | Admit: 2019-08-18 | Discharge: 2019-08-18 | Disposition: A | Discharge status: Home / Self Care | Payer: Medicare Other | Source: Ambulatory Visit | Attending: Internal Medicine | Admitting: Internal Medicine

## 2019-08-18 DIAGNOSIS — Z1231 Encounter for screening mammogram for malignant neoplasm of breast: Secondary | ICD-10-CM | POA: Diagnosis not present

## 2019-08-20 ENCOUNTER — Ambulatory Visit: Payer: Medicare Other | Attending: Internal Medicine

## 2019-08-20 DIAGNOSIS — Z23 Encounter for immunization: Secondary | ICD-10-CM

## 2019-08-20 NOTE — Progress Notes (Signed)
   Covid-19 Vaccination Clinic  Name:  Heidi Good    MRN: [id number] DOB: [birth year]  08/20/2019  [name] was observed post Covid-19 immunization for 15 minutes without incident. She was provided with Vaccine Information Sheet and instruction to access the V-Safe system.   [name] was instructed to call 911 with any severe reactions post vaccine: Marland Kitchen Difficulty breathing  . Swelling of face and throat  . A fast heartbeat  . A bad rash all over body  . Dizziness and weakness   Immunizations Administered    Name Date Dose VIS Date Route   Pfizer COVID-19 Vaccine 08/20/2019 11:20 AM 0.3 mL 04/23/2019 Intramuscular   Manufacturer: [place]   Lot: [id number]   [place]: [id number]

## 2019-08-26 ENCOUNTER — Ambulatory Visit (INDEPENDENT_AMBULATORY_CARE_PROVIDER_SITE_OTHER): Payer: Medicare Other | Admitting: Podiatry

## 2019-08-26 ENCOUNTER — Other Ambulatory Visit: Payer: Self-pay | Admitting: Podiatry

## 2019-08-26 ENCOUNTER — Ambulatory Visit (INDEPENDENT_AMBULATORY_CARE_PROVIDER_SITE_OTHER): Payer: Medicare Other

## 2019-08-26 ENCOUNTER — Other Ambulatory Visit: Payer: Self-pay

## 2019-08-26 ENCOUNTER — Encounter: Payer: Self-pay | Admitting: Podiatry

## 2019-08-26 VITALS — Temp 97.4°F

## 2019-08-26 DIAGNOSIS — M778 Other enthesopathies, not elsewhere classified: Secondary | ICD-10-CM | POA: Diagnosis not present

## 2019-08-26 DIAGNOSIS — M79672 Pain in left foot: Secondary | ICD-10-CM

## 2019-08-26 MED ORDER — DICLOFENAC SODIUM 75 MG PO TBEC: 75.0000 mg | DELAYED_RELEASE_TABLET | Freq: Two times a day (BID) | ORAL | 2 refills | Status: DC

## 2019-08-26 NOTE — Progress Notes (Signed)
Subjective:   Patient ID: Heidi Good, female   DOB: 59 y.o.   MRN: [id number]   HPI Patient presents stating that on the side of the left foot it is become very sore and she does not remember specific injury and she presents with interpreter today   ROS      Objective:  Physical Exam  Neurovascular status intact with dorsal lateral aspect left foot found to be inflamed around the extensor tendon complex with fluid buildup no history of injury     Assessment:  Probability that this is a tendinitis-like condition with possibility for systemic condition     Plan:  H&P condition reviewed sterile prep done and injected the tendon complex left 3 mg Kenalog 5 mg Xylocaine instructed on ice therapy and reappoint if symptoms were to indicate  X-rays were negative for signs of fracture or bony contusion associated with this area

## 2019-09-06 ENCOUNTER — Encounter: Payer: Self-pay | Admitting: Internal Medicine

## 2019-09-06 ENCOUNTER — Other Ambulatory Visit: Payer: Self-pay

## 2019-09-06 ENCOUNTER — Ambulatory Visit (INDEPENDENT_AMBULATORY_CARE_PROVIDER_SITE_OTHER): Payer: Medicare Other | Admitting: Internal Medicine

## 2019-09-06 VITALS — BP 146/74 | HR 50 | Temp 98.5°F | Ht 65.0 in | Wt 201.0 lb

## 2019-09-06 DIAGNOSIS — E034 Atrophy of thyroid (acquired): Secondary | ICD-10-CM | POA: Diagnosis not present

## 2019-09-06 DIAGNOSIS — R739 Hyperglycemia, unspecified: Secondary | ICD-10-CM

## 2019-09-06 DIAGNOSIS — R600 Localized edema: Secondary | ICD-10-CM

## 2019-09-06 DIAGNOSIS — J4520 Mild intermittent asthma, uncomplicated: Secondary | ICD-10-CM | POA: Diagnosis not present

## 2019-09-06 MED ORDER — LEVOTHYROXINE SODIUM 75 MCG PO TABS: ORAL_TABLET | ORAL | 3 refills | Status: DC

## 2019-09-06 MED ORDER — POTASSIUM CHLORIDE ER 8 MEQ PO TBCR: EXTENDED_RELEASE_TABLET | ORAL | 3 refills | Status: DC

## 2019-09-06 MED ORDER — ESTRADIOL 1 MG PO TABS: ORAL_TABLET | ORAL | 3 refills | Status: DC

## 2019-09-06 NOTE — Assessment & Plan Note (Signed)
Furosemide prn 

## 2019-09-06 NOTE — Assessment & Plan Note (Signed)
Levothroid 

## 2019-09-06 NOTE — Assessment & Plan Note (Signed)
Doing wel

## 2019-09-06 NOTE — Progress Notes (Signed)
Subjective:  Patient ID: Heidi Good, female    DOB: 1960/09/11  Age: 59 y.o. MRN: [id number]  CC: No chief complaint on file.   HPI Delaylah Holling [facility] presents for hypothyroidism F/u L foot pain  - better after a shot by Dr Paulla Dolly F/u leg swelling - better  Outpatient Medications Prior to Visit  Medication Sig Dispense Refill  . albuterol (PROVENTIL HFA;VENTOLIN HFA) 108 (90 Base) MCG/ACT inhaler Inhale 1-2 puffs into the lungs every 6 (six) hours as needed for wheezing or shortness of breath. 1 Inhaler 5  . albuterol (PROVENTIL) (2.5 MG/3ML) 0.083% nebulizer solution Take 3 mLs (2.5 mg total) by nebulization every 6 (six) hours as needed for wheezing or shortness of breath. 75 mL 5  . ALPRAZolam (XANAX) 0.5 MG tablet Take 1 tablet (0.5 mg total) by mouth daily as needed for anxiety. 30 tablet 1  . AMBULATORY NON FORMULARY MEDICATION Medication Name: Nitroglycerin gel 0.125 % apply pea size amount to rectum twice daily x 8 weeks 30 g 0  . ARIPiprazole (ABILIFY) 15 MG tablet TAKE 1 TABLET(15 MG) BY MOUTH DAILY 90 tablet 1  . Cholecalciferol (VITAMIN D3) 2000 units capsule Take 1 capsule (2,000 Units total) by mouth daily. 100 capsule 3  . diclofenac (VOLTAREN) 75 MG EC tablet Take 1 tablet (75 mg total) by mouth 2 (two) times daily. 50 tablet 2  . diclofenac sodium (VOLTAREN) 1 % GEL Apply topically at bedtime.    Marland Kitchen erythromycin ophthalmic ointment Place 1 application into the left eye at bedtime. L eye qhs x 5 days 3.5 g 0  . estradiol (ESTRACE) 1 MG tablet TAKE 1 TABLET(1 MG) BY MOUTH DAILY 90 tablet 3  . fluticasone (FLOVENT HFA) 110 MCG/ACT inhaler Inhale 2 puffs into the lungs 2 (two) times daily. 1 Inhaler 12  . Fluticasone Propionate, Inhal, (FLOVENT DISKUS) 100 MCG/BLIST AEPB Inhale 1 Inhaler into the lungs 2 (two) times a day. 3 each 3  . furosemide (LASIX) 20 MG tablet Take 1-2 tablets (20-40 mg total) by mouth daily as needed for edema. 30 tablet 3  . levothyroxine (SYNTHROID)  75 MCG tablet TAKE 1 TABLET(75 MCG) BY MOUTH DAILY 90 tablet 3  . loratadine (CLARITIN) 10 MG tablet Take 1 tablet (10 mg total) by mouth daily. 90 tablet 3  . NITRO-BID 2 % ointment Place 1 application rectally. As needed    . Olopatadine HCl (PATADAY) 0.2 % SOLN Apply to eye.    Marland Kitchen Plecanatide (TRULANCE) 3 MG TABS Take 3 mg by mouth daily. 30 tablet 3  . potassium chloride (KLOR-CON) 8 MEQ tablet TAKE 1 TABLET(8 MEQ) BY MOUTH DAILY 90 tablet 3   No facility-administered medications prior to visit.    ROS: Review of Systems  Constitutional: Negative for activity change, appetite change, chills, fatigue and unexpected weight change.  HENT: Positive for hearing loss. Negative for congestion, mouth sores and sinus pressure.   Eyes: Negative for visual disturbance.  Respiratory: Negative for cough and chest tightness.   Cardiovascular: Negative for chest pain, palpitations and leg swelling.  Gastrointestinal: Negative for abdominal pain and nausea.  Genitourinary: Negative for difficulty urinating, frequency and vaginal pain.  Musculoskeletal: Positive for arthralgias. Negative for back pain and gait problem.  Skin: Negative for pallor and rash.  Neurological: Negative for dizziness, tremors, weakness, numbness and headaches.  Psychiatric/Behavioral: Negative for confusion and sleep disturbance.    Objective:  BP (!) 146/74 (BP Location: Left Arm, Patient Position: Sitting, Cuff Size: Large)  Pulse (!) 50   Temp 98.5 F (36.9 C) (Oral)   Ht 5\' 5"  (1.651 m)   Wt 201 lb (91.2 kg)   SpO2 96%   BMI 33.45 kg/m   BP Readings from Last 3 Encounters:  09/06/19 (!) 146/74  06/07/19 126/80  02/16/19 126/82    Wt Readings from Last 3 Encounters:  09/06/19 201 lb (91.2 kg)  06/07/19 198 lb (89.8 kg)  02/16/19 202 lb 8 oz (91.9 kg)    Physical Exam Constitutional:      General: She is not in acute distress.    Appearance: She is well-developed.  HENT:     Head: Normocephalic.      Right Ear: External ear normal.     Left Ear: External ear normal.     Nose: Nose normal.  Eyes:     General:        Right eye: No discharge.        Left eye: No discharge.     Conjunctiva/sclera: Conjunctivae normal.     Pupils: Pupils are equal, round, and reactive to light.  Neck:     Thyroid: No thyromegaly.     Vascular: No JVD.     Trachea: No tracheal deviation.  Cardiovascular:     Rate and Rhythm: Normal rate. Rhythm irregular.     Heart sounds: Normal heart sounds.  Pulmonary:     Effort: No respiratory distress.     Breath sounds: No stridor. No wheezing.  Abdominal:     General: Bowel sounds are normal. There is no distension.     Palpations: Abdomen is soft. There is no mass.     Tenderness: There is no abdominal tenderness. There is no guarding or rebound.  Musculoskeletal:        General: No tenderness.     Cervical back: Normal range of motion and neck supple.  Lymphadenopathy:     Cervical: No cervical adenopathy.  Skin:    Findings: No erythema or rash.  Neurological:     Mental Status: She is oriented to person, place, and time.     Cranial Nerves: No cranial nerve deficit.     Motor: No abnormal muscle tone.     Coordination: Coordination normal.     Deep Tendon Reflexes: Reflexes normal.  Psychiatric:        Behavior: Behavior normal.        Thought Content: Thought content normal.        Judgment: Judgment normal.   heart beats w/PVCs - reg w/irreg beats  Lab Results  Component Value Date   WBC 13.8 (H) 06/07/2019   HGB 14.7 06/07/2019   HCT 44.5 06/07/2019   PLT 176.0 06/07/2019   GLUCOSE 95 06/07/2019   CHOL 137 03/10/2015   TRIG 194.0 (H) 03/10/2015   HDL 43.10 03/10/2015   LDLCALC 55 03/10/2015   ALT 28 06/07/2019   AST 38 (H) 06/07/2019   NA 136 06/07/2019   K 3.8 06/07/2019   CL 99 06/07/2019   CREATININE 0.71 06/07/2019   BUN 8 06/07/2019   CO2 27 06/07/2019   TSH 1.10 06/07/2019   HGBA1C 6.0 12/02/2018    MM 3D SCREEN  BREAST BILATERAL  Result Date: 08/18/2019 CLINICAL DATA:  Screening. EXAM: DIGITAL SCREENING BILATERAL MAMMOGRAM WITH TOMO AND CAD COMPARISON:  Previous exam(s). ACR Breast Density Category c: The breast tissue is heterogeneously dense, which may obscure small masses. FINDINGS: There are no findings suspicious for malignancy. Images were processed with  CAD. IMPRESSION: No mammographic evidence of malignancy. A result letter of this screening mammogram will be mailed directly to the patient. RECOMMENDATION: Screening mammogram in one year. (Code:SM-B-01Y) BI-RADS CATEGORY  1: Negative. Electronically Signed   By: Marin Olp M.D.   On: 08/18/2019 14:40    Assessment & Plan:   There are no diagnoses linked to this encounter.   No orders of the defined types were placed in this encounter.    Follow-up: No follow-ups on file.  Walker Kehr, MD

## 2019-09-13 ENCOUNTER — Ambulatory Visit: Payer: Medicare Other | Attending: Internal Medicine

## 2019-09-13 DIAGNOSIS — Z23 Encounter for immunization: Secondary | ICD-10-CM

## 2019-09-13 NOTE — Progress Notes (Signed)
   Covid-19 Vaccination Clinic  Name:  Heidi Good    MRN: [id number] DOB: 05/21/1960  09/13/2019  [name] was observed post Covid-19 immunization for 15 minutes without incident. She was provided with Vaccine Information Sheet and instruction to access the V-Safe system.   [name] was instructed to call 911 with any severe reactions post vaccine: Marland Kitchen Difficulty breathing  . Swelling of face and throat  . A fast heartbeat  . A bad rash all over body  . Dizziness and weakness   Immunizations Administered    Name Date Dose VIS Date Route   Pfizer COVID-19 Vaccine 09/13/2019 10:23 AM 0.3 mL 07/07/2018 Intramuscular   Manufacturer: [place]   Lot: [id number]   [place]: [id number]

## 2019-09-27 ENCOUNTER — Other Ambulatory Visit: Payer: Self-pay | Admitting: Psychiatry

## 2019-09-28 IMAGING — DX DG CHEST 2V
2 series · 2 of 2 positions shown · non-contrast
Comparison: 06/25/2016

CLINICAL DATA: RIGHT flank and posterior chest pain

EXAM:
CHEST  2 VIEW

[chest pa]
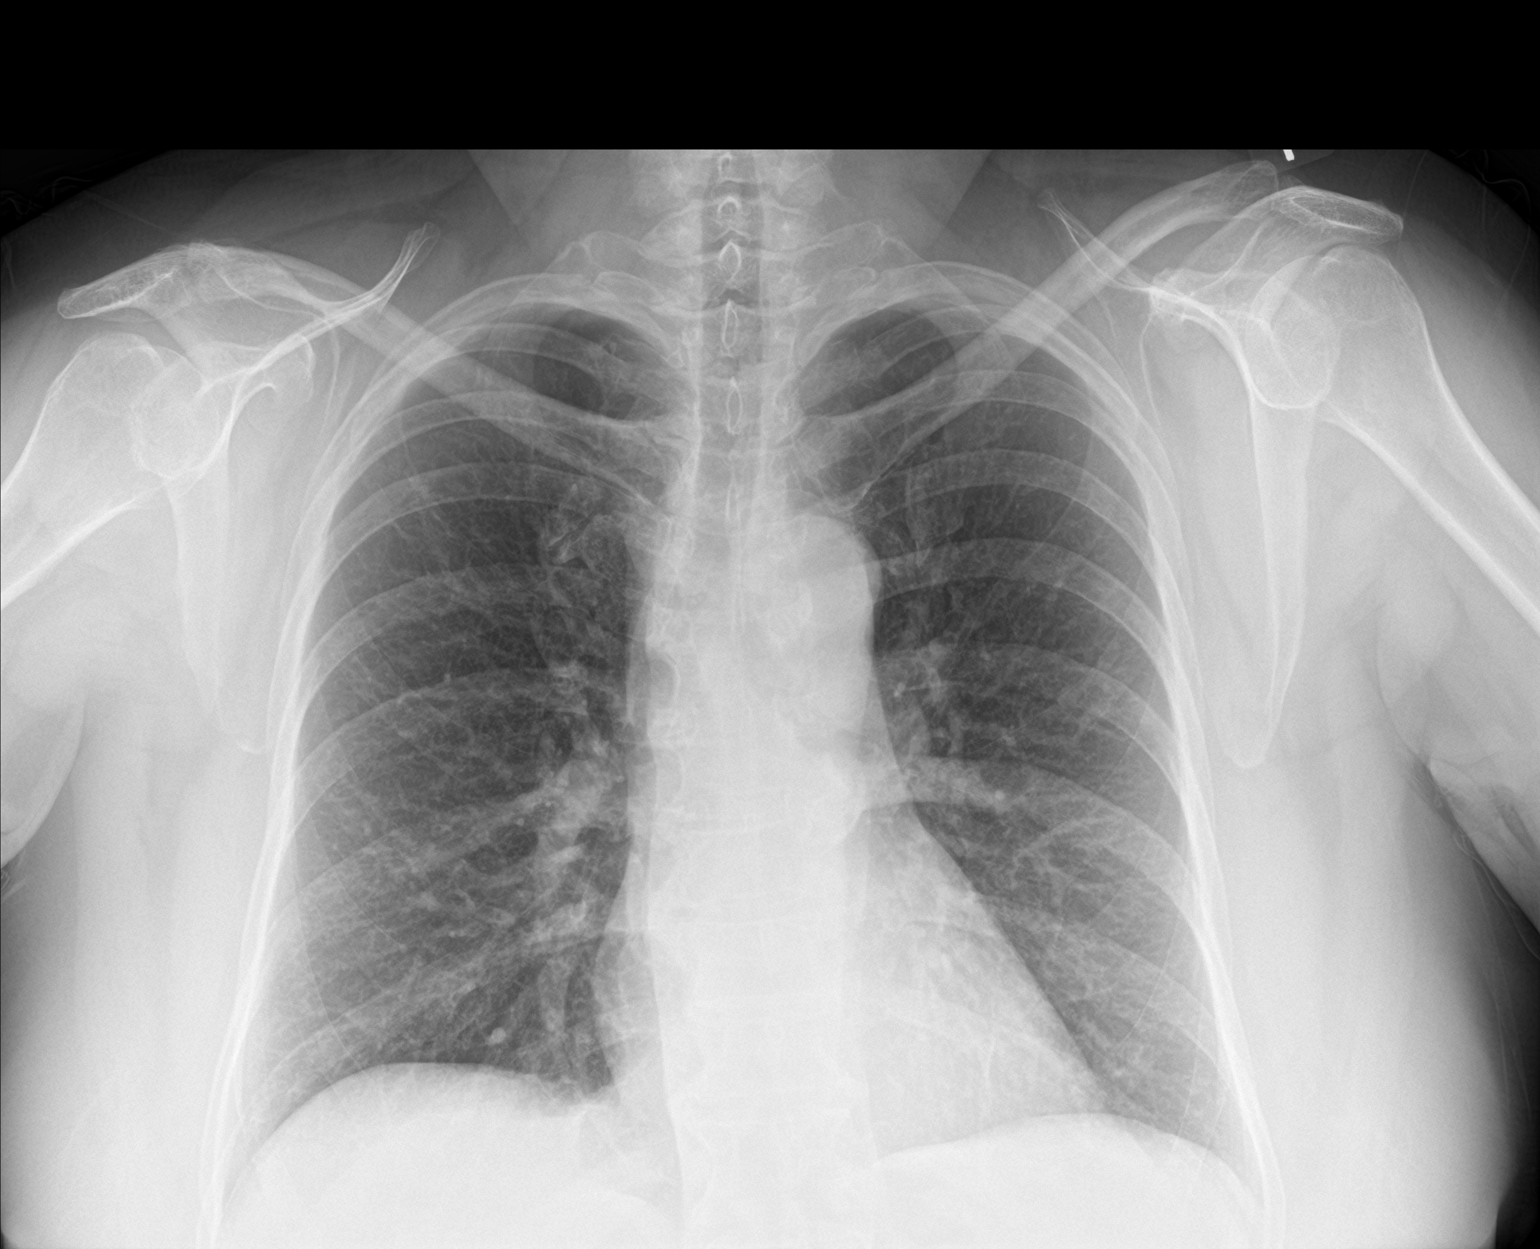

[chest lat]
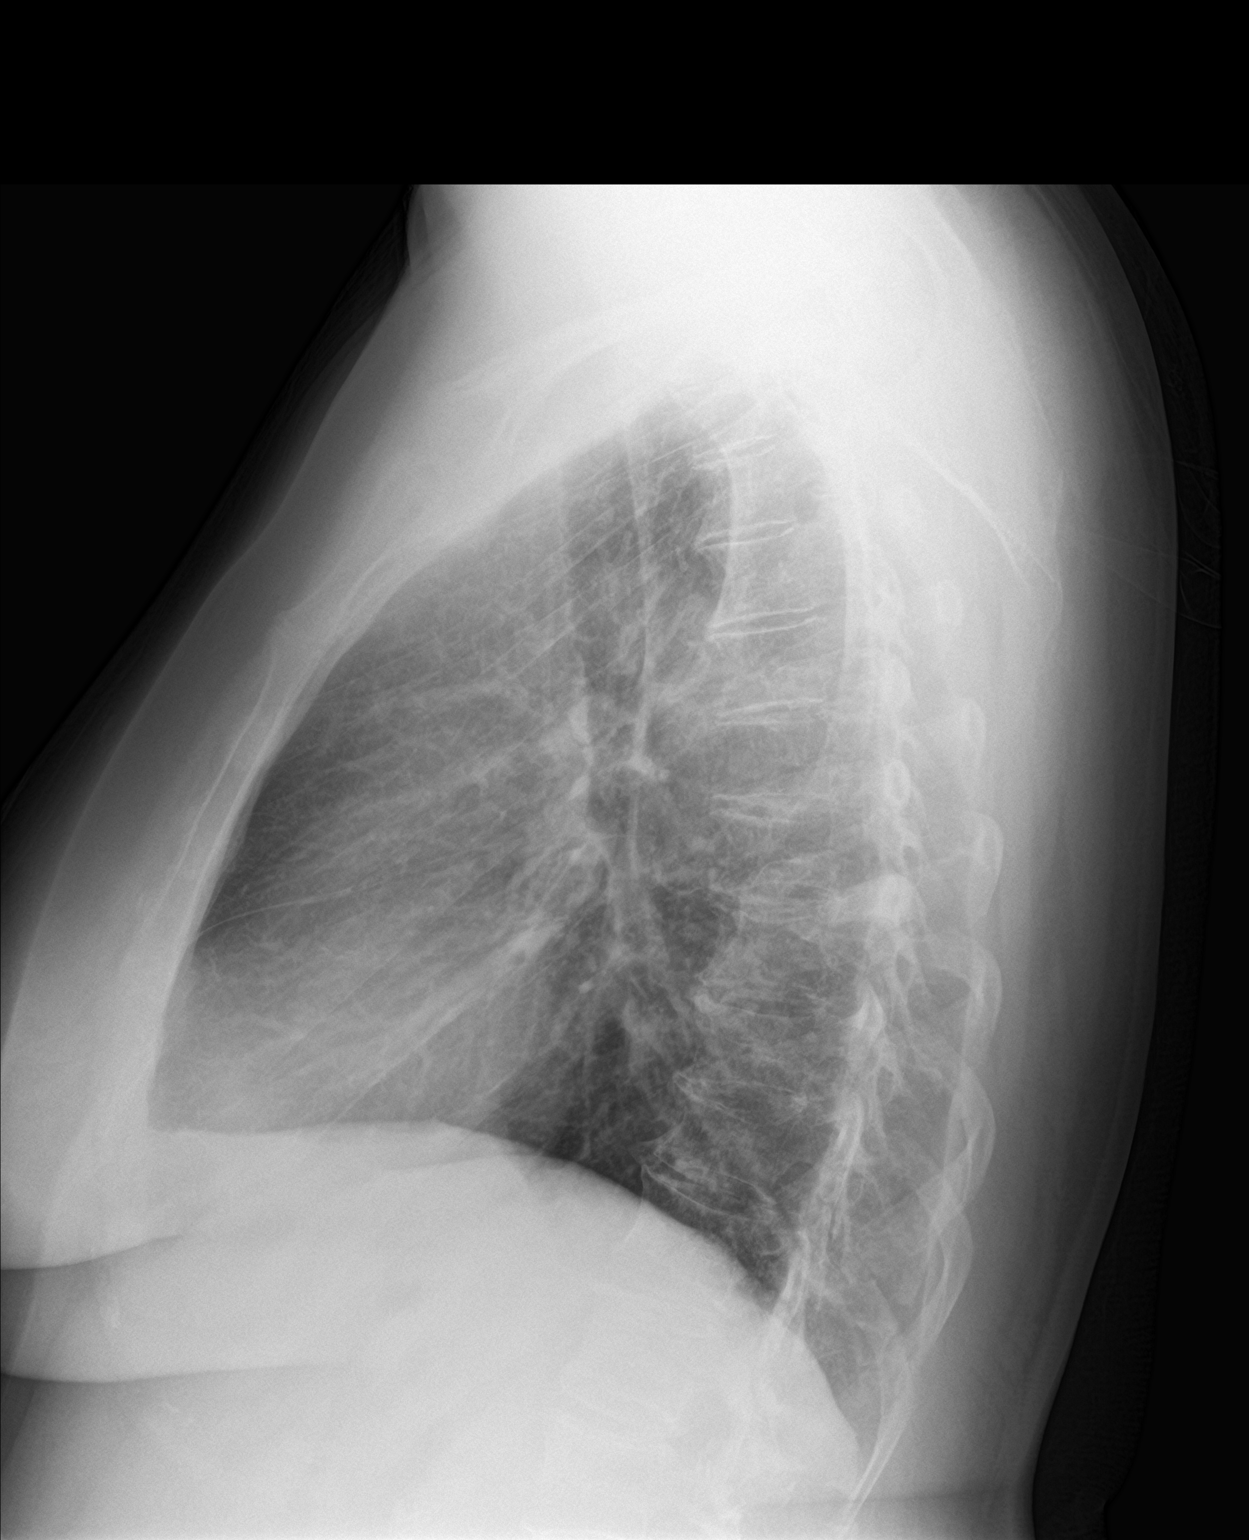

[2 of 2 positions shown; findings below may reference images not displayed]

FINDINGS: Normal heart size, mediastinal contours, and pulmonary vascularity.

Lungs clear.

No pleural effusion or pneumothorax.

Bones unremarkable.
IMPRESSION: Normal exam.

## 2019-09-29 ENCOUNTER — Other Ambulatory Visit: Payer: Self-pay

## 2019-09-29 ENCOUNTER — Ambulatory Visit (INDEPENDENT_AMBULATORY_CARE_PROVIDER_SITE_OTHER): Payer: Medicare Other | Admitting: Psychiatry

## 2019-09-29 ENCOUNTER — Encounter: Payer: Self-pay | Admitting: Psychiatry

## 2019-09-29 DIAGNOSIS — F4322 Adjustment disorder with anxiety: Secondary | ICD-10-CM

## 2019-09-29 DIAGNOSIS — F200 Paranoid schizophrenia: Secondary | ICD-10-CM | POA: Diagnosis not present

## 2019-09-29 DIAGNOSIS — F5105 Insomnia due to other mental disorder: Secondary | ICD-10-CM | POA: Diagnosis not present

## 2019-09-29 DIAGNOSIS — F99 Mental disorder, not otherwise specified: Secondary | ICD-10-CM

## 2019-09-29 MED ORDER — ARIPIPRAZOLE 15 MG PO TABS: 15.0000 mg | ORAL_TABLET | Freq: Every day | ORAL | 1 refills | Status: DC

## 2019-09-29 MED ORDER — ALPRAZOLAM 1 MG PO TABS: 0.5000 mg | ORAL_TABLET | Freq: Every day | ORAL | 2 refills | Status: DC | PRN

## 2019-09-29 NOTE — Progress Notes (Signed)
[name] [id number] 06-26-1960 59 y.o.  Subjective:   Patient ID:  Heidi Good is a 59 y.o. (DOB August 29, 1960) female.  Chief Complaint:  Chief Complaint  Patient presents with  . Follow-up    dx and meds  . Anxiety    Depression        Associated symptoms include no decreased concentration and no suicidal ideas.  patient is deaf and seen with an interpreter.  She has notes. Heidi Good [facility] presents to the office today for follow-up of schizophrenia.  Last seen November, 2020.  She continues on Abilify 15 mg daily and Xanax is the only psychiatric meds and no meds were changed.  Patient seen today the with the assistance of a translator due to her deafness.  Stress M declining health and she feels more nervous and worried.  Helps mother.  Takes alprazolam occ with little benefit.  Sleep is affected by care of mother.     M gets confused and dx dementia and sleeping a lot. B comes and helps.  She pays the bills and not the mother.    No longer has funny feelings and doesn't need the Xanax.  Denies feeling afraid at home.  B will move in when M passes away.  Has several dogs.  [place] Shepherds make her feel safer.  Abilify 15 mg increase helped her.     In November bothered bc could see something and feel something walk by outside and dog acted funny.  Then brother came over and checked things.  She felt someone was walking around outside.  Says this happened 7 times by mid DEC.  She feels in danger from this incidents.  Never saw anyone but believed there was someone there.  Police came by and didn't find anything.  Thinks it's an old friend.  M having problems with dementia.  Pt takes care of her.  Pt helps her with meds and cooks for her and helps bathe hers.  Rare Xanax is less helpful.  Patient reports stable mood and denies depressed or irritable moods.  Patient denies any recent difficulty with anxiety except as noted. No fear.  Patient denies difficulty with sleep  initiation or maintenance. Denies appetite disturbance.  Patient reports that energy and motivation have been good.  Patient denies any difficulty with concentration.  Patient denies any suicidal ideation.  15 mg Abilify helped her feel better, calmer than did the 10 mg Abilify.  No SE and claims compliance.  Denies voices, depression, anger on this dosage.  Sleep good 8 hours.  No drowsiness. Watches news.  Pt drives without problems.  Other psychiatric medications tried include perphenazine which she did not like, loxapine which she did not like, and Saphris.  She complained that it gave her strange feeling under the tongue.  She has taken other psychiatric medications in the past but she does not know the names of them and we have not been able to get the records. Abilify 15 benefit.  Review of Systems:  Review of Systems  HENT:       Deaf  Respiratory: Negative for cough and shortness of breath.   Cardiovascular: Negative for chest pain.  Neurological: Negative for tremors and weakness.  Psychiatric/Behavioral: Positive for depression. Negative for agitation, behavioral problems, confusion, decreased concentration, dysphoric mood, hallucinations, self-injury, sleep disturbance and suicidal ideas. The patient is nervous/anxious. The patient is not hyperactive.     Medications: I have reviewed the patient's current medications.  Current Outpatient Medications  Medication Sig Dispense Refill  . albuterol (PROVENTIL HFA;VENTOLIN HFA) 108 (90 Base) MCG/ACT inhaler Inhale 1-2 puffs into the lungs every 6 (six) hours as needed for wheezing or shortness of breath. 1 Inhaler 5  . albuterol (PROVENTIL) (2.5 MG/3ML) 0.083% nebulizer solution Take 3 mLs (2.5 mg total) by nebulization every 6 (six) hours as needed for wheezing or shortness of breath. 75 mL 5  . ALPRAZolam (XANAX) 1 MG tablet Take 0.5-1 tablets (0.5-1 mg total) by mouth daily as needed for anxiety. 30 tablet 2  . AMBULATORY NON  FORMULARY MEDICATION Medication Name: Nitroglycerin gel 0.125 % apply pea size amount to rectum twice daily x 8 weeks 30 g 0  . ARIPiprazole (ABILIFY) 15 MG tablet Take 1 tablet (15 mg total) by mouth daily. 90 tablet 1  . Cholecalciferol (VITAMIN D3) 2000 units capsule Take 1 capsule (2,000 Units total) by mouth daily. 100 capsule 3  . diclofenac (VOLTAREN) 75 MG EC tablet Take 1 tablet (75 mg total) by mouth 2 (two) times daily. 50 tablet 2  . diclofenac sodium (VOLTAREN) 1 % GEL Apply topically at bedtime.    Marland Kitchen erythromycin ophthalmic ointment Place 1 application into the left eye at bedtime. L eye qhs x 5 days 3.5 g 0  . estradiol (ESTRACE) 1 MG tablet TAKE 1 TABLET(1 MG) BY MOUTH DAILY 90 tablet 3  . fluticasone (FLOVENT HFA) 110 MCG/ACT inhaler Inhale 2 puffs into the lungs 2 (two) times daily. 1 Inhaler 12  . Fluticasone Propionate, Inhal, (FLOVENT DISKUS) 100 MCG/BLIST AEPB Inhale 1 Inhaler into the lungs 2 (two) times a day. 3 each 3  . furosemide (LASIX) 20 MG tablet Take 1-2 tablets (20-40 mg total) by mouth daily as needed for edema. 30 tablet 3  . levothyroxine (SYNTHROID) 75 MCG tablet TAKE 1 TABLET(75 MCG) BY MOUTH DAILY 90 tablet 3  . loratadine (CLARITIN) 10 MG tablet Take 1 tablet (10 mg total) by mouth daily. 90 tablet 3  . NITRO-BID 2 % ointment Place 1 application rectally. As needed    . Olopatadine HCl (PATADAY) 0.2 % SOLN Apply to eye.    Marland Kitchen Plecanatide (TRULANCE) 3 MG TABS Take 3 mg by mouth daily. 30 tablet 3  . potassium chloride (KLOR-CON) 8 MEQ tablet TAKE 1 TABLET(8 MEQ) BY MOUTH DAILY 90 tablet 3   No current facility-administered medications for this visit.    Medication Side Effects: None  Allergies:  Allergies  Allergen Reactions  . Doxycycline Nausea And Vomiting  . Hydrocodone     Nausea from cough syrup  . Other     Dog, cat, nuts, grass, trees    Past Medical History:  Diagnosis Date  . Anxiety   . Constipation   . Deafness   . Depression   .  Diabetes mellitus ([place])   . Gastroparesis   . Hyperlipemia   . Hypothyroidism     Family History  Problem Relation Age of Onset  . Diabetes Mother   . Colon polyps Mother        over 150 polyps detected  . Heart disease Father   . Multiple myeloma Father   . Emphysema Father   . Heart attack Father   . Liver cancer Maternal Grandmother        not primary source, spread to her liver  . Heart attack Maternal Grandfather   . Emphysema Paternal Grandfather   . Colon cancer Neg Hx   . Esophageal cancer Neg Hx   . Rectal  cancer Neg Hx   . Stomach cancer Neg Hx     Social History   Socioeconomic History  . Marital status: Divorced    Spouse name: Not on file  . Number of children: 0  . Years of education: Not on file  . Highest education level: Not on file  Occupational History  . Occupation: disablied    Employer: RETIRED  Tobacco Use  . Smoking status: Never Smoker  . Smokeless tobacco: Never Used  Substance and Sexual Activity  . Alcohol use: Never  . Drug use: Never  . Sexual activity: Not on file  Other Topics Concern  . Not on file  Social History Narrative  . Not on file   Social Determinants of Health   Financial Resource Strain:   . Difficulty of Paying Living Expenses:   Food Insecurity:   . Worried About Charity fundraiser in the Last Year:   . Arboriculturist in the Last Year:   Transportation Needs:   . Film/video editor (Medical):   Marland Kitchen Lack of Transportation (Non-Medical):   Physical Activity:   . Days of Exercise per Week:   . Minutes of Exercise per Session:   Stress:   . Feeling of Stress :   Social Connections:   . Frequency of Communication with Friends and Family:   . Frequency of Social Gatherings with Friends and Family:   . Attends Religious Services:   . Active Member of Clubs or Organizations:   . Attends Archivist Meetings:   Marland Kitchen Marital Status:   Intimate Partner Violence:   . Fear of Current or Ex-Partner:   .  Emotionally Abused:   Marland Kitchen Physically Abused:   . Sexually Abused:    She drives.  Still lives with her neighbor.  Past Medical History, Surgical history, Social history, and Family history were reviewed and updated as appropriate.   Please see review of systems for further details on the patient's review from today.   Objective:   Physical Exam:  There were no vitals taken for this visit.  Physical Exam Constitutional:      General: She is not in acute distress.    Appearance: She is well-developed.  Musculoskeletal:        General: No deformity.  Neurological:     Mental Status: She is alert and oriented to person, place, and time.     Motor: No tremor.     Coordination: Coordination normal.     Gait: Gait normal.  Psychiatric:        Attention and Perception: She is attentive. She does not perceive auditory hallucinations.        Mood and Affect: Mood is not anxious or depressed. Affect is not labile, blunt, angry or inappropriate.        Speech: Speech normal.        Behavior: Behavior normal. Behavior is not agitated, aggressive or hyperactive. Behavior is cooperative.        Thought Content: Thought content is not paranoid. Thought content does not include homicidal or suicidal ideation. Thought content does not include homicidal or suicidal plan.        Cognition and Memory: Cognition normal.        Judgment: Judgment normal.     Comments: Insight poor.  No auditory or visual hallucinations. No delusions.  Pleasant     Lab Review:     Component Value Date/Time   NA 136 06/07/2019 1451  K 3.8 06/07/2019 1451   CL 99 06/07/2019 1451   CO2 27 06/07/2019 1451   GLUCOSE 95 06/07/2019 1451   BUN 8 06/07/2019 1451   CREATININE 0.71 06/07/2019 1451   CALCIUM 9.4 06/07/2019 1451   PROT 7.2 06/07/2019 1451   ALBUMIN 3.7 06/07/2019 1451   AST 38 (H) 06/07/2019 1451   ALT 28 06/07/2019 1451   ALKPHOS 106 06/07/2019 1451   BILITOT 1.0 06/07/2019 1451   GFRNONAA 95  07/11/2008 1616   GFRAA 115 07/11/2008 1616       Component Value Date/Time   WBC 13.8 (H) 06/07/2019 1451   RBC 4.95 06/07/2019 1451   HGB 14.7 06/07/2019 1451   HCT 44.5 06/07/2019 1451   PLT 176.0 06/07/2019 1451   MCV 89.8 06/07/2019 1451   MCHC 33.2 06/07/2019 1451   RDW 13.4 06/07/2019 1451   LYMPHSABS 3.5 06/07/2019 1451   MONOABS 0.8 06/07/2019 1451   EOSABS 0.2 06/07/2019 1451   BASOSABS 0.1 06/07/2019 1451    No results found for: POCLITH, LITHIUM   No results found for: PHENYTOIN, PHENOBARB, VALPROATE, CBMZ   .res Assessment: Plan:    Rilley was seen today for follow-up and anxiety.  Diagnoses and all orders for this visit:  Paranoid schizophrenia ([place]) -     ARIPiprazole (ABILIFY) 15 MG tablet; Take 1 tablet (15 mg total) by mouth daily.  Insomnia due to other mental disorder  Adjustment disorder with anxious mood -     ALPRAZolam (XANAX) 1 MG tablet; Take 0.5-1 tablets (0.5-1 mg total) by mouth daily as needed for anxiety.    Greater than 50% of face to face time with patient and an interpretor for 30 mins was spent on counseling and coordination of care. We discussed potential metabolic side effects associated with atypical antipsychotics, as well as potential risk for movement side effects. Advised pt to contact office if movement side effects occur.   Pt has had a relapse of paranoid psychosis. Offered option of increasing the Abilify.  Refuses increases.  Watches TV, cares for mother, no anger problems.  She is not agitated nor hostile though she is paranoid.  She denies being on a great deal of fear but the situation does make her nervous.  She is able to function normally.  She is agreed to continue Abilify 15 mg which she has taken for many years in the past to good effect.  She had a brief period of time where she was convinced it was not the right medicine for her.  We were not able to find any other medications that she felt satisfied taking.  It  does not not appear that she has active psychosis at the moment.  She has a history of repeated paranoid psychotic episodes.  During some of these she could become agitated and aggressive with her mother.  She agrees that this is a good medicine for her  No SE.  Her labs are followed by her primary care doctor. Normal glucose and Hgb A1C.  OK increase Xanax to 0.5-1 mg prn anxiety per her request.   Disc SE risk including sedation.  We discussed the short-term risks associated with benzodiazepines including sedation and increased fall risk among others.  Discussed long-term side effect risk including dependence, potential withdrawal symptoms, and the potential eventual dose-related risk of dementia.  But recent studies from 2020 dispute this association between benzodiazepines and dementia risk. Newer studies in 2020 do not support an association with dementia.  This  appointment was 30 minutes with interpretor  Follow-up 6 months.  She was encouraged to call if she decided to increase the medication before the next appointment.  Lynder Parents MD, DFAPA  Please see After Visit Summary for patient specific instructions.  Future Appointments  Date Time Provider [place]  03/07/2020  1:20 PM Plotnikov, Evie Lacks, MD [facility] None    No orders of the defined types were placed in this encounter.     -------------------------------

## 2019-11-06 ENCOUNTER — Other Ambulatory Visit: Payer: Self-pay | Admitting: Podiatry

## 2019-12-21 ENCOUNTER — Telehealth: Payer: Self-pay | Admitting: Internal Medicine

## 2019-12-21 NOTE — Telephone Encounter (Signed)
New message:    Pt's mother is calling on behalf of the pt and would like to know if some cough medicine can be called in for the pt. Please advise.

## 2019-12-21 NOTE — Telephone Encounter (Signed)
Pt's mother states she also needs something for her allergies.

## 2019-12-22 MED ORDER — BENZONATATE 200 MG PO CAPS
200.0000 mg | ORAL_CAPSULE | Freq: Three times a day (TID) | ORAL | 1 refills | Status: DC | PRN
Start: 2019-12-22 — End: 2020-03-07

## 2019-12-22 MED ORDER — LORATADINE 10 MG PO TABS: 10.0000 mg | ORAL_TABLET | Freq: Every day | ORAL | 3 refills | Status: DC

## 2019-12-22 NOTE — Telephone Encounter (Signed)
Loratadine and Tessalon prescriptions were sent to the pharmacy.  Thanks

## 2019-12-22 NOTE — Telephone Encounter (Signed)
Tried calling pt/mom there was no answer x's 10 rings. No VM will retry later,,,/lmb

## 2019-12-22 NOTE — Telephone Encounter (Signed)
Notified pt/mom MD sent meds to [facility].Marland KitchenJohny Chess

## 2019-12-26 ENCOUNTER — Other Ambulatory Visit: Payer: Self-pay | Admitting: Psychiatry

## 2019-12-26 DIAGNOSIS — F200 Paranoid schizophrenia: Secondary | ICD-10-CM

## 2019-12-29 ENCOUNTER — Ambulatory Visit (INDEPENDENT_AMBULATORY_CARE_PROVIDER_SITE_OTHER): Payer: Medicare Other | Admitting: Podiatry

## 2019-12-29 DIAGNOSIS — L84 Corns and callosities: Secondary | ICD-10-CM | POA: Diagnosis not present

## 2019-12-29 DIAGNOSIS — L601 Onycholysis: Secondary | ICD-10-CM

## 2019-12-29 DIAGNOSIS — M2042 Other hammer toe(s) (acquired), left foot: Secondary | ICD-10-CM

## 2019-12-29 DIAGNOSIS — M79675 Pain in left toe(s): Secondary | ICD-10-CM

## 2019-12-29 NOTE — Progress Notes (Signed)
  Subjective:  Patient ID: Heidi Good, female    DOB: 1960/08/22,  MRN: [id number]  Chief Complaint  Patient presents with  . Nail Problem    L 2nd toenail. Pt stated, "I woke up in the middle of the night [5 days ago] with pain around the nail. There was some bleeding and pus". No known injury.    59 y.o. female presents with the above complaint. History confirmed with patient.  She is here today with an ASL interpreter.  She has a history of bunionectomy on the his left foot with Dr. Paulla Dolly, she also has calluses on the tip of her toe.  Objective:  Physical Exam: warm, good capillary refill, no trophic changes or ulcerative lesions, normal DP and PT pulses and normal sensory exam. Left Foot: Well-healed bunionectomy scar, digital contractures 2 through 5 are present and semirigid in nature, distal tip calluses of 2, 3, 4, of the second distal tip has some dried blood appears to have a dried blister that is subungual. Right Foot: Hallux valgus is present, digital contractures of 2 through 5.   Assessment:   1. Hammertoe of left foot   2. Callus of foot   3. Pain in toe of left foot   4. Onycholysis      Plan:  Patient was evaluated and treated and all questions answered.  Second digit was anesthetized with a digital block of 1 cc 2% Xylocaine plain and 1 cc 0.5% bupivacaine plain, following this the callus was debrided and the overlying loose nail was debrided as well.  The remaining nail was well attached to the nail plate.  No nail avulsion was necessary. -Discussed with her that is likely secondary to digital contractures.  We discussed nonsurgical treatment with a toe crest pad which I dispensed today.  Also discussed surgical treatment including PIPJ fusion.  If the toe crest is not successful we will pursue hammertoe correction surgery. -Soaking instructions provided  Return in about 1 month (around 01/29/2020) for nail re-check.

## 2020-01-10 ENCOUNTER — Telehealth: Payer: Self-pay

## 2020-01-10 NOTE — Telephone Encounter (Signed)
PA submitted with cover my meds and has been approved for [employer]. From 12-11-2019 to 01-09-2021

## 2020-01-21 ENCOUNTER — Other Ambulatory Visit: Payer: Self-pay

## 2020-01-21 ENCOUNTER — Ambulatory Visit (INDEPENDENT_AMBULATORY_CARE_PROVIDER_SITE_OTHER): Payer: Medicare Other | Admitting: General Practice

## 2020-01-21 DIAGNOSIS — Z23 Encounter for immunization: Secondary | ICD-10-CM | POA: Diagnosis not present

## 2020-01-27 ENCOUNTER — Ambulatory Visit (INDEPENDENT_AMBULATORY_CARE_PROVIDER_SITE_OTHER): Payer: Medicare Other | Admitting: Podiatry

## 2020-01-27 ENCOUNTER — Other Ambulatory Visit: Payer: Self-pay

## 2020-01-27 DIAGNOSIS — T8484XA Pain due to internal orthopedic prosthetic devices, implants and grafts, initial encounter: Secondary | ICD-10-CM

## 2020-01-27 DIAGNOSIS — M2042 Other hammer toe(s) (acquired), left foot: Secondary | ICD-10-CM

## 2020-01-27 NOTE — Progress Notes (Signed)
  Subjective:  Patient ID: Heidi Good, female    DOB: August 23, 1960,  MRN: [id number]  Chief Complaint  Patient presents with  . Nail Problem    1 month nail/callus check - left     59 y.o. female presents with the above complaint. History confirmed with patient.  She is here today with an ASL interpreter.  She has a history of bunionectomy on the his left foot with Dr. Paulla Dolly, she also has calluses on the tip of her toe. Has had some improvement in pain since procedure at last visit.  The toe crest was minimally helpful.  She is interested in surgery correction of the hammertoe deformities and removal of the painful pin that is in her foot from her bunion surgery.  Objective:  Physical Exam: warm, good capillary refill, no trophic changes or ulcerative lesions, normal DP and PT pulses and normal sensory exam. Left Foot: Well-healed bunionectomy scar, K wire is palpable over the site digital contractures 2 through 5 are present and semirigid in nature, distal tip calluses of 2, 3, 4, of the second distal tip  Right Foot: Hallux valgus is present, digital contractures of 2 through 5.   Assessment:   1. Hammertoe of left foot   2. Pain due to internal orthopedic prosthetic device, initial encounter [facility])      Plan:  Patient was evaluated and treated and all questions answered.  Discussed the etiology and treatment including surgical and non surgical treatment for painful hammertoes. She has exhausted all non surgical treatment prior to this visit including shoe gear changes and padding. She desires surgical intervention. We discussed all risks including but not limited to: pain, swelling, infection, scar, numbness which may be temporary or permanent, chronic pain, stiffness, nerve pain or damage, wound healing problems, bone healing problems including delayed or non-union and recurrence. Specifically we discussed the following procedures: Hammertoe correction of the left second and third  toes with PIPJ arthrodesis and concomitant removal of the prominent K wire. Informed consent was signed today. Surgery will be scheduled at a mutually agreeable date. Information regarding this will be forwarded to our surgery scheduler.   Surgical plan:  Procedure: -left foot removal of implant deep, hammertoe correction 2,3  Location: -[facility]  Anesthesia plan: -IV sedation with local anesthesia  Postoperative pain plan: - Tylenol 1000 mg every 6 hours, ibuprofen 600 mg every 6 hours, gabapentin 300 mg every 8 hours x5 days, oxycodone 5 mg 1-2 tabs every 6 hours only as needed  DVT prophylaxis: -none required, WBAT in surgical shoe  WB Restrictions / DME needs: -Surgical shoe dispensed today    No follow-ups on file.

## 2020-02-18 ENCOUNTER — Other Ambulatory Visit: Payer: Self-pay | Admitting: Podiatry

## 2020-02-18 DIAGNOSIS — Z4889 Encounter for other specified surgical aftercare: Secondary | ICD-10-CM | POA: Diagnosis not present

## 2020-02-18 DIAGNOSIS — M2042 Other hammer toe(s) (acquired), left foot: Secondary | ICD-10-CM | POA: Diagnosis not present

## 2020-02-18 DIAGNOSIS — T8484XA Pain due to internal orthopedic prosthetic devices, implants and grafts, initial encounter: Secondary | ICD-10-CM | POA: Diagnosis not present

## 2020-02-18 MED ORDER — OXYCODONE HCL 5 MG PO TABS: 5.0000 mg | ORAL_TABLET | ORAL | 0 refills | Status: AC | PRN

## 2020-02-18 MED ORDER — GABAPENTIN 300 MG PO CAPS
300.0000 mg | ORAL_CAPSULE | Freq: Three times a day (TID) | ORAL | 0 refills | Status: DC
Start: 2020-02-18 — End: 2020-02-22

## 2020-02-18 MED ORDER — ACETAMINOPHEN 500 MG PO TABS: 1000.0000 mg | ORAL_TABLET | Freq: Four times a day (QID) | ORAL | 0 refills | Status: AC | PRN

## 2020-02-18 MED ORDER — IBUPROFEN 600 MG PO TABS: 600.0000 mg | ORAL_TABLET | Freq: Four times a day (QID) | ORAL | 0 refills | Status: AC | PRN

## 2020-02-22 ENCOUNTER — Other Ambulatory Visit: Payer: Self-pay | Admitting: Podiatry

## 2020-02-22 NOTE — Telephone Encounter (Signed)
Please advise 

## 2020-02-24 ENCOUNTER — Encounter: Payer: Self-pay | Admitting: Podiatry

## 2020-02-24 ENCOUNTER — Ambulatory Visit (INDEPENDENT_AMBULATORY_CARE_PROVIDER_SITE_OTHER): Payer: Medicare Other | Admitting: Podiatry

## 2020-02-24 ENCOUNTER — Other Ambulatory Visit: Payer: Self-pay

## 2020-02-24 ENCOUNTER — Ambulatory Visit (INDEPENDENT_AMBULATORY_CARE_PROVIDER_SITE_OTHER): Payer: Medicare Other

## 2020-02-24 DIAGNOSIS — M2042 Other hammer toe(s) (acquired), left foot: Secondary | ICD-10-CM | POA: Diagnosis not present

## 2020-02-24 NOTE — Progress Notes (Signed)
  Subjective:  Patient ID: Heidi Good, female    DOB: 1960/12/28,  MRN: [id number]  Chief Complaint  Patient presents with  . Routine Post Op    PT stated that it is very sore but she has no concerns     DOS: 02/18/2020 Procedure: Left foot removal of deep implant, hammertoe correction 2, 3  59 y.o. female returns for post-op check.  Here today with ASL interpreter.  She is doing quite well.  Some soreness in the toes.  She is not taking any pain medication currently the gabapentin made her very sleepy  Review of Systems: Negative except as noted in the HPI. Denies N/V/F/Ch.   Objective:  There were no vitals filed for this visit. There is no height or weight on file to calculate BMI. Constitutional Well developed. Well nourished.  Vascular Foot warm and well perfused. Capillary refill normal to all digits.   Neurologic Normal speech. Oriented to person, place, and time. Epicritic sensation to light touch grossly present bilaterally.  Dermatologic Skin healing well without signs of infection. Skin edges well coapted without signs of infection.  Orthopedic: Tenderness to palpation noted about the surgical site.  Expected postoperative edema.  Kirschner wires intact in position   Radiographs: 3 weightbearing views of the left foot taken, PIPJ arthrodesis sites well opposed with Kirschner wires in good position Assessment:   1. Hammertoe of left foot    Plan:  Patient was evaluated and treated and all questions answered.  S/p foot surgery left -Progressing as expected post-operatively. -XR: Expected postoperative changes -WB Status: WBAT in surgical shoe -Sutures: Will be left intact for 2 more weeks remove at next visit. -Medications: No refills required -Foot redressed.  Return in about 2 weeks (around 03/09/2020) for suture removal.

## 2020-03-02 ENCOUNTER — Encounter: Payer: Federal, State, Local not specified - PPO | Admitting: Podiatry

## 2020-03-02 ENCOUNTER — Other Ambulatory Visit: Payer: Self-pay | Admitting: Podiatry

## 2020-03-02 NOTE — Telephone Encounter (Signed)
Please advise. Thanks.  

## 2020-03-02 NOTE — Telephone Encounter (Signed)
I'm pretty sure she told me it made her really drowsy and she stopped taking it. I'll wait until I see her at next visit unless she calls, I think this has been an automated response from the pharmacy system

## 2020-03-06 ENCOUNTER — Other Ambulatory Visit: Payer: Self-pay | Admitting: Podiatry

## 2020-03-07 ENCOUNTER — Other Ambulatory Visit: Payer: Self-pay

## 2020-03-07 ENCOUNTER — Ambulatory Visit (INDEPENDENT_AMBULATORY_CARE_PROVIDER_SITE_OTHER): Payer: Medicare Other | Admitting: Internal Medicine

## 2020-03-07 ENCOUNTER — Encounter: Payer: Self-pay | Admitting: Internal Medicine

## 2020-03-07 DIAGNOSIS — R600 Localized edema: Secondary | ICD-10-CM | POA: Diagnosis not present

## 2020-03-07 DIAGNOSIS — J302 Other seasonal allergic rhinitis: Secondary | ICD-10-CM | POA: Diagnosis not present

## 2020-03-07 DIAGNOSIS — J3089 Other allergic rhinitis: Secondary | ICD-10-CM

## 2020-03-07 DIAGNOSIS — R7303 Prediabetes: Secondary | ICD-10-CM

## 2020-03-07 DIAGNOSIS — J3081 Allergic rhinitis due to animal (cat) (dog) hair and dander: Secondary | ICD-10-CM

## 2020-03-07 DIAGNOSIS — E034 Atrophy of thyroid (acquired): Secondary | ICD-10-CM

## 2020-03-07 DIAGNOSIS — R739 Hyperglycemia, unspecified: Secondary | ICD-10-CM

## 2020-03-07 DIAGNOSIS — F321 Major depressive disorder, single episode, moderate: Secondary | ICD-10-CM | POA: Diagnosis not present

## 2020-03-07 LAB — URINALYSIS, ROUTINE W REFLEX MICROSCOPIC
Bilirubin Urine: NEGATIVE
Leukocytes,Ua: NEGATIVE
Nitrite: NEGATIVE
Specific Gravity, Urine: 1.02 (ref 1.000–1.030)
Total Protein, Urine: NEGATIVE
Urine Glucose: NEGATIVE
Urobilinogen, UA: 1 (ref 0.0–1.0)
pH: 6.5 (ref 5.0–8.0)

## 2020-03-07 LAB — CBC WITH DIFFERENTIAL/PLATELET
Basophils Absolute: 0.1 10*3/uL (ref 0.0–0.1)
Basophils Relative: 1.1 % (ref 0.0–3.0)
Eosinophils Absolute: 0.3 10*3/uL (ref 0.0–0.7)
Eosinophils Relative: 2.5 % (ref 0.0–5.0)
HCT: 45.8 % (ref 36.0–46.0)
Hemoglobin: 15.6 g/dL — ABNORMAL HIGH (ref 12.0–15.0)
Lymphocytes Relative: 30.3 % (ref 12.0–46.0)
Lymphs Abs: 3.2 10*3/uL (ref 0.7–4.0)
MCHC: 34 g/dL (ref 30.0–36.0)
MCV: 87.7 fl (ref 78.0–100.0)
Monocytes Absolute: 0.9 10*3/uL (ref 0.1–1.0)
Monocytes Relative: 8.9 % (ref 3.0–12.0)
Neutro Abs: 6.1 10*3/uL (ref 1.4–7.7)
Neutrophils Relative %: 57.2 % (ref 43.0–77.0)
Platelets: 184 10*3/uL (ref 150.0–400.0)
RBC: 5.23 Mil/uL — ABNORMAL HIGH (ref 3.87–5.11)
RDW: 13 % (ref 11.5–15.5)
WBC: 10.7 10*3/uL — ABNORMAL HIGH (ref 4.0–10.5)

## 2020-03-07 LAB — BASIC METABOLIC PANEL
BUN: 7 mg/dL (ref 6–23)
CO2: 30 mEq/L (ref 19–32)
Calcium: 9.5 mg/dL (ref 8.4–10.5)
Chloride: 100 mEq/L (ref 96–112)
Creatinine, Ser: 0.75 mg/dL (ref 0.40–1.20)
GFR: 87.58 mL/min (ref >60.00)
Glucose, Bld: 95 mg/dL (ref 70–99)
Potassium: 3.6 mEq/L (ref 3.5–5.1)
Sodium: 137 mEq/L (ref 135–145)

## 2020-03-07 LAB — HEPATIC FUNCTION PANEL
ALT: 26 U/L (ref 0–35)
AST: 29 U/L (ref 0–37)
Albumin: 4 g/dL (ref 3.5–5.2)
Alkaline Phosphatase: 110 U/L (ref 39–117)
Bilirubin, Direct: 0.1 mg/dL (ref 0.0–0.3)
Total Bilirubin: 0.7 mg/dL (ref 0.2–1.2)
Total Protein: 7.3 g/dL (ref 6.0–8.3)

## 2020-03-07 LAB — LIPID PANEL
Cholesterol: 185 mg/dL (ref 0–200)
HDL: 40.9 mg/dL (ref >39.00)
NonHDL: 143.76
Total CHOL/HDL Ratio: 5
Triglycerides: 241 mg/dL — ABNORMAL HIGH (ref 0.0–149.0)
VLDL: 48.2 mg/dL — ABNORMAL HIGH (ref 0.0–40.0)

## 2020-03-07 LAB — HEMOGLOBIN A1C: Hgb A1c MFr Bld: 6.1 % (ref 4.6–6.5)

## 2020-03-07 LAB — TSH: TSH: 0.55 u[IU]/mL (ref 0.35–4.50)

## 2020-03-07 LAB — LDL CHOLESTEROL, DIRECT: Direct LDL: 52 mg/dL

## 2020-03-07 MED ORDER — POTASSIUM CHLORIDE ER 8 MEQ PO TBCR: EXTENDED_RELEASE_TABLET | ORAL | 3 refills | Status: DC

## 2020-03-07 MED ORDER — LEVOTHYROXINE SODIUM 75 MCG PO TABS: ORAL_TABLET | ORAL | 3 refills | Status: DC

## 2020-03-07 NOTE — Addendum Note (Signed)
Addended by: Cresenciano Lick on: 03/07/2020 01:53 PM   Modules accepted: Orders

## 2020-03-07 NOTE — Assessment & Plan Note (Signed)
Labs

## 2020-03-07 NOTE — Assessment & Plan Note (Signed)
Loratidine

## 2020-03-07 NOTE — Assessment & Plan Note (Signed)
Claritin po

## 2020-03-07 NOTE — Progress Notes (Signed)
Subjective:  Patient ID: Heidi Good, female    DOB: 09-25-1960  Age: 59 y.o. MRN: [id number]  CC: Follow-up (6 months F/U)   HPI Heidi Good presents for s/p L foot surgery for hammer toes F/u anxiety, asthma, thyroid condition  Outpatient Medications Prior to Visit  Medication Sig Dispense Refill  . albuterol (PROVENTIL HFA;VENTOLIN HFA) 108 (90 Base) MCG/ACT inhaler Inhale 1-2 puffs into the lungs every 6 (six) hours as needed for wheezing or shortness of breath. 1 Inhaler 5  . albuterol (PROVENTIL) (2.5 MG/3ML) 0.083% nebulizer solution Take 3 mLs (2.5 mg total) by nebulization every 6 (six) hours as needed for wheezing or shortness of breath. 75 mL 5  . ALPRAZolam (XANAX) 1 MG tablet Take 0.5-1 tablets (0.5-1 mg total) by mouth daily as needed for anxiety. 30 tablet 2  . AMBULATORY NON FORMULARY MEDICATION Medication Name: Nitroglycerin gel 0.125 % apply pea size amount to rectum twice daily x 8 weeks 30 g 0  . ARIPiprazole (ABILIFY) 15 MG tablet TAKE 1 TABLET(15 MG) BY MOUTH DAILY 90 tablet 1  . benzonatate (TESSALON) 200 MG capsule Take 1 capsule (200 mg total) by mouth 3 (three) times daily as needed for cough. 30 capsule 1  . Cholecalciferol (VITAMIN D3) 2000 units capsule Take 1 capsule (2,000 Units total) by mouth daily. 100 capsule 3  . diclofenac (VOLTAREN) 75 MG EC tablet TAKE 1 TABLET(75 MG) BY MOUTH TWICE DAILY 50 tablet 2  . diclofenac sodium (VOLTAREN) 1 % GEL Apply topically at bedtime.    Marland Kitchen erythromycin ophthalmic ointment Place 1 application into the left eye at bedtime. L eye qhs x 5 days 3.5 g 0  . fluticasone (FLOVENT HFA) 110 MCG/ACT inhaler Inhale 2 puffs into the lungs 2 (two) times daily. 1 Inhaler 12  . Fluticasone Propionate, Inhal, (FLOVENT DISKUS) 100 MCG/BLIST AEPB Inhale 1 Inhaler into the lungs 2 (two) times a day. 3 each 3  . furosemide (LASIX) 20 MG tablet Take 1-2 tablets (20-40 mg total) by mouth daily as needed for edema. 30 tablet 3  .  gabapentin (NEURONTIN) 300 MG capsule TAKE 1 CAPSULE(300 MG) BY MOUTH THREE TIMES DAILY FOR 7 DAYS 21 capsule 0  . levothyroxine (SYNTHROID) 75 MCG tablet TAKE 1 TABLET(75 MCG) BY MOUTH DAILY 90 tablet 3  . loratadine (CLARITIN) 10 MG tablet Take 1 tablet (10 mg total) by mouth daily. 90 tablet 3  . NITRO-BID 2 % ointment Place 1 application rectally. As needed    . Olopatadine HCl (PATADAY) 0.2 % SOLN Apply to eye.    Marland Kitchen Plecanatide (TRULANCE) 3 MG TABS Take 3 mg by mouth daily. 30 tablet 3  . potassium chloride (KLOR-CON) 8 MEQ tablet TAKE 1 TABLET(8 MEQ) BY MOUTH DAILY 90 tablet 3  . estradiol (ESTRACE) 1 MG tablet TAKE 1 TABLET(1 MG) BY MOUTH DAILY (Patient not taking: Reported on 03/07/2020) 90 tablet 3   No facility-administered medications prior to visit.    ROS: Review of Systems  Constitutional: Negative for activity change, appetite change, chills, fatigue and unexpected weight change.  HENT: Positive for hearing loss. Negative for congestion, mouth sores and sinus pressure.   Eyes: Negative for visual disturbance.  Respiratory: Negative for cough and chest tightness.   Gastrointestinal: Negative for abdominal pain and nausea.  Genitourinary: Negative for difficulty urinating, frequency and vaginal pain.  Musculoskeletal: Positive for arthralgias. Negative for back pain and gait problem.  Skin: Negative for pallor and rash.  Neurological: Negative for  dizziness, tremors, weakness, numbness and headaches.  Psychiatric/Behavioral: Negative for confusion and sleep disturbance.    Objective:  BP 130/82 (BP Location: Left Arm)   Pulse 70   Temp 98.8 F (37.1 C) (Oral)   Wt 202 lb 4.8 oz (91.8 kg)   SpO2 97%   BMI 33.66 kg/m   BP Readings from Last 3 Encounters:  03/07/20 130/82  09/06/19 (!) 146/74  06/07/19 126/80    Wt Readings from Last 3 Encounters:  03/07/20 202 lb 4.8 oz (91.8 kg)  09/06/19 201 lb (91.2 kg)  06/07/19 198 lb (89.8 kg)    Physical  Exam Constitutional:      General: She is not in acute distress.    Appearance: She is well-developed. She is obese.  HENT:     Head: Normocephalic.     Right Ear: External ear normal.     Left Ear: External ear normal.     Nose: Nose normal.  Eyes:     General:        Right eye: No discharge.        Left eye: No discharge.     Conjunctiva/sclera: Conjunctivae normal.     Pupils: Pupils are equal, round, and reactive to light.  Neck:     Thyroid: No thyromegaly.     Vascular: No JVD.     Trachea: No tracheal deviation.  Cardiovascular:     Rate and Rhythm: Normal rate and regular rhythm.     Heart sounds: Normal heart sounds.  Pulmonary:     Effort: No respiratory distress.     Breath sounds: No stridor. No wheezing.  Abdominal:     General: Bowel sounds are normal. There is no distension.     Palpations: Abdomen is soft. There is no mass.     Tenderness: There is no abdominal tenderness. There is no guarding or rebound.  Musculoskeletal:        General: No tenderness.     Cervical back: Normal range of motion and neck supple.  Lymphadenopathy:     Cervical: No cervical adenopathy.  Skin:    Findings: No erythema or rash.  Neurological:     Cranial Nerves: No cranial nerve deficit.     Motor: No abnormal muscle tone.     Coordination: Coordination normal.     Deep Tendon Reflexes: Reflexes normal.  Psychiatric:        Behavior: Behavior normal.        Thought Content: Thought content normal.        Judgment: Judgment normal.    L foot is in a surgical boot   Lab Results  Component Value Date   WBC 13.8 (H) 06/07/2019   HGB 14.7 06/07/2019   HCT 44.5 06/07/2019   PLT 176.0 06/07/2019   GLUCOSE 95 06/07/2019   CHOL 137 03/10/2015   TRIG 194.0 (H) 03/10/2015   HDL 43.10 03/10/2015   LDLCALC 55 03/10/2015   ALT 28 06/07/2019   AST 38 (H) 06/07/2019   NA 136 06/07/2019   K 3.8 06/07/2019   CL 99 06/07/2019   CREATININE 0.71 06/07/2019   BUN 8 06/07/2019    CO2 27 06/07/2019   TSH 1.10 06/07/2019   HGBA1C 6.0 12/02/2018    MM 3D SCREEN BREAST BILATERAL  Result Date: 08/18/2019 CLINICAL DATA:  Screening. EXAM: DIGITAL SCREENING BILATERAL MAMMOGRAM WITH TOMO AND CAD COMPARISON:  Previous exam(s). ACR Breast Density Category c: The breast tissue is heterogeneously dense, which may obscure small  masses. FINDINGS: There are no findings suspicious for malignancy. Images were processed with CAD. IMPRESSION: No mammographic evidence of malignancy. A result letter of this screening mammogram will be mailed directly to the patient. RECOMMENDATION: Screening mammogram in one year. (Code:SM-B-01Y) BI-RADS CATEGORY  1: Negative. Electronically Signed   By: Marin Olp M.D.   On: 08/18/2019 14:40    Assessment & Plan:    Walker Kehr, MD

## 2020-03-07 NOTE — Assessment & Plan Note (Signed)
Labs Levothroid 

## 2020-03-07 NOTE — Assessment & Plan Note (Signed)
Abilify

## 2020-03-13 ENCOUNTER — Telehealth: Payer: Self-pay | Admitting: *Deleted

## 2020-03-13 DIAGNOSIS — R0683 Snoring: Secondary | ICD-10-CM

## 2020-03-13 NOTE — Telephone Encounter (Signed)
Pt call back concerning lab results. She states she need had a CPAP machine. Will need MD to order one.Marland KitchenJohny Chess

## 2020-03-14 ENCOUNTER — Ambulatory Visit (INDEPENDENT_AMBULATORY_CARE_PROVIDER_SITE_OTHER): Payer: Medicare Other | Admitting: Podiatry

## 2020-03-14 ENCOUNTER — Encounter: Payer: Self-pay | Admitting: Podiatry

## 2020-03-14 ENCOUNTER — Other Ambulatory Visit: Payer: Self-pay

## 2020-03-14 DIAGNOSIS — M2042 Other hammer toe(s) (acquired), left foot: Secondary | ICD-10-CM

## 2020-03-14 DIAGNOSIS — Z9889 Other specified postprocedural states: Secondary | ICD-10-CM

## 2020-03-14 DIAGNOSIS — T8484XA Pain due to internal orthopedic prosthetic devices, implants and grafts, initial encounter: Secondary | ICD-10-CM

## 2020-03-14 NOTE — Progress Notes (Signed)
  Subjective:  Patient ID: Heidi Good, female    DOB: 1960-12-18,  MRN: [id number]  Chief Complaint  Patient presents with  . Routine Post Op    PT stated that she is doing good she doesnt take the pain medication because she feels like she does not need it. IS concerned about the pain when the stiches come out.    DOS: 02/18/2020 Procedure: Left foot removal of deep implant, hammertoe correction 2, 3  59 y.o. female returns for post-op check.  Here today with ASL interpreter.  She is doing quite well.  Minimal pain today she is anxious about having the pins and sutures removed  Review of Systems: Negative except as noted in the HPI. Denies N/V/F/Ch.   Objective:  There were no vitals filed for this visit. There is no height or weight on file to calculate BMI. Constitutional Well developed. Well nourished.  Vascular Foot warm and well perfused. Capillary refill normal to all digits.   Neurologic Normal speech. Oriented to person, place, and time. Epicritic sensation to light touch grossly present bilaterally.  Dermatologic Skin healing well without signs of infection. Skin edges well coapted without signs of infection.  Orthopedic: Tenderness to palpation noted about the surgical site.  Expected postoperative edema.  Kirschner wires intact in position   Radiographs: 3 weightbearing views of the left foot taken, PIPJ arthrodesis sites well opposed with Kirschner wires in good position Assessment:   No diagnosis found. Plan:  Patient was evaluated and treated and all questions answered.  S/p foot surgery left -Progressing as expected post-operatively. -XR: New x-rays at next visit prior to pin removal -WB Status: WBAT in surgical shoe -Sutures: Removed today.  She may begin bathing.  After bathing apply Neosporin to pin sites   Return in 10 days (on 03/24/2020) for new x-rays, pin removal.

## 2020-03-14 NOTE — Telephone Encounter (Signed)
I will make a referral for her to see Dr. Halford Chessman, a sleep specialist.  Thanks

## 2020-03-14 NOTE — Telephone Encounter (Signed)
Tried calling pt there was no answer LMOM RTC.Marland KitchenJohny Chess

## 2020-03-16 ENCOUNTER — Encounter: Payer: Federal, State, Local not specified - PPO | Admitting: Podiatry

## 2020-03-24 ENCOUNTER — Ambulatory Visit (INDEPENDENT_AMBULATORY_CARE_PROVIDER_SITE_OTHER): Payer: Medicare Other

## 2020-03-24 ENCOUNTER — Other Ambulatory Visit: Payer: Self-pay

## 2020-03-24 ENCOUNTER — Ambulatory Visit (INDEPENDENT_AMBULATORY_CARE_PROVIDER_SITE_OTHER): Payer: Medicare Other | Admitting: Podiatry

## 2020-03-24 DIAGNOSIS — M2042 Other hammer toe(s) (acquired), left foot: Secondary | ICD-10-CM | POA: Diagnosis not present

## 2020-03-27 ENCOUNTER — Encounter: Payer: Self-pay | Admitting: Pulmonary Disease

## 2020-03-27 ENCOUNTER — Other Ambulatory Visit: Payer: Self-pay

## 2020-03-27 ENCOUNTER — Ambulatory Visit (INDEPENDENT_AMBULATORY_CARE_PROVIDER_SITE_OTHER): Payer: Medicare Other | Admitting: Pulmonary Disease

## 2020-03-27 ENCOUNTER — Encounter: Payer: Self-pay | Admitting: Podiatry

## 2020-03-27 VITALS — BP 128/82 | HR 80 | Temp 98.5°F | Ht 65.0 in | Wt 190.6 lb

## 2020-03-27 DIAGNOSIS — Z72821 Inadequate sleep hygiene: Secondary | ICD-10-CM | POA: Diagnosis not present

## 2020-03-27 DIAGNOSIS — Z9189 Other specified personal risk factors, not elsewhere classified: Secondary | ICD-10-CM | POA: Diagnosis not present

## 2020-03-27 DIAGNOSIS — R0683 Snoring: Secondary | ICD-10-CM

## 2020-03-27 DIAGNOSIS — H903 Sensorineural hearing loss, bilateral: Secondary | ICD-10-CM

## 2020-03-27 DIAGNOSIS — Z0000 Encounter for general adult medical examination without abnormal findings: Secondary | ICD-10-CM | POA: Diagnosis not present

## 2020-03-27 NOTE — Assessment & Plan Note (Signed)
Chronic Using ASL interpreter during exam

## 2020-03-27 NOTE — Progress Notes (Signed)
@Patient  ID: [name], female    DOB: 07-Mar-1961, 59 y.o.   MRN: [id number]  Chief Complaint  Patient presents with  . Follow-up    Sleeping difficulties, last seen October/2019    Referring provider: Cassandria Anger, MD  HPI:  59 year old female never smoker followed in our office for sleeping difficulties, asthma  PMH: Hypothyroidism, paranoid schizophrenia, hearing loss-deaf, gastroparesis, hyperlipidemia, prediabetes, restless leg syndrome Smoker/ Smoking History: Never smoker Maintenance: Flovent 100 Pt of: Dr. Halford Chessman  03/27/2020  - Visit   59 year old female never smoker presenting back to our office after last being seen in October/2019 by Dr. Halford Chessman.  Plan of care at that point in time was as follows: Continue Flovent for management of allergic asthma, referred to cardiology for evaluation of frequent PVCs, March/2018 sleep study was negative for sleep apnea - t was emphasized to the patient she needs to work on weight loss, is recommended she follow-up in 8 weeks.  Patient presenting back to our office today at the request of her primary care provider for reevaluation of sleeping difficulties.  Primary care referring back due to elevated hemoglobin as well as poor sleep quality and snoring.  Patient reports that she has not followed up with our office given the fact that she has now become the primary caregiver for her mom.  Her mom requires total care with worsening Alzheimer's.  Patient is primary caregiver 24/7.  Occasionally her brother is able to help out.  She reports typically going to sleep around 2200-2230 at night and waking up around 0430.  She does take a nap usually 1 time a day sleeping for about 1-1.5 hours.  03/27/20- SLEEP ROS   Do you feel you have non-refreshing sleep?: Yes Daytime sleepiness?: Yes Witnessed apneas?:  Loud snoring?: Yes Choking or gasping episodes that wake pt up from sleep?: Yes Does patient experienced sleepiness passenger in a  car, lying down to rest in the afternoons, sitting and reading, watching TV or other social situations?:sometimes Bedtime is typically around: 10 pm  Sleep latency?: within 64min  Which position does the patient sleep in: supine in chair Nocturnal awakenings?: 1 time  Nightmares?: occasionally - not often - 2x a month  Sleep talking?: no  Restless Legs?: yes in past, none currently When this patient out of bed in the morning?: 430am When wake up to you feel tired, treated any dryness in the mouth, morning headaches?:  Yes dry mouth, yes to morning headaches   STOP BANG questionnaire  Snoring? Yes Tiredness? Yes Observed apneas? Unsure  Elevated blood pressure? No  BMI greater than 35? No  Age greater than 44? Yes Neck circumference greater than 40 cm? No  Female gender? No   Scoring:  One-point is signed for each.   0-2 equals low risk, 3-4 equals intermediate risk, those with 5 or greater high risk for having obstructive sleep apnea  Score: 3 -4   Patient is up-to-date with COVID-19 vaccinations as well as flu vaccine.  Questionaires / Pulmonary Flowsheets:   Epworth:  Results of the Epworth flowsheet 03/11/2018  Sitting and reading 3  Watching TV 3  Sitting, inactive in a public place (e.g. a theatre or a meeting) 0  As a passenger in a car for an hour without a break 0  Lying down to rest in the afternoon when circumstances permit 0  Sitting and talking to someone 0  Sitting quietly after a lunch without alcohol 0  In a car, while  stopped for a few minutes in traffic 0  Total score 6    Tests:   FENO:  Lab Results  Component Value Date   NITRICOXIDE 14 05/01/2017    PFT: PFT Results Latest Ref Rng & Units 09/10/2016  FVC-Pre L 2.56  FVC-Predicted Pre % 72  FVC-Post L 2.55  FVC-Predicted Post % 71  Pre FEV1/FVC % % 80  Post FEV1/FCV % % 84  FEV1-Pre L 2.06  FEV1-Predicted Pre % 74  FEV1-Post L 2.15  DLCO uncorrected ml/min/mmHg 22.04  DLCO UNC% % 86    DLCO corrected ml/min/mmHg 21.29  DLCO COR %Predicted % 83  DLVA Predicted % 117  TLC L 4.72  TLC % Predicted % 90  RV % Predicted % 106    WALK:  No flowsheet data found.  Imaging: No results found.  Lab Results:  CBC    Component Value Date/Time   WBC 10.7 (H) 03/07/2020 1353   RBC 5.23 (H) 03/07/2020 1353   HGB 15.6 (H) 03/07/2020 1353   HCT 45.8 03/07/2020 1353   PLT 184.0 03/07/2020 1353   MCV 87.7 03/07/2020 1353   MCHC 34.0 03/07/2020 1353   RDW 13.0 03/07/2020 1353   LYMPHSABS 3.2 03/07/2020 1353   MONOABS 0.9 03/07/2020 1353   EOSABS 0.3 03/07/2020 1353   BASOSABS 0.1 03/07/2020 1353    BMET    Component Value Date/Time   NA 137 03/07/2020 1353   K 3.6 03/07/2020 1353   CL 100 03/07/2020 1353   CO2 30 03/07/2020 1353   GLUCOSE 95 03/07/2020 1353   BUN 7 03/07/2020 1353   CREATININE 0.75 03/07/2020 1353   CALCIUM 9.5 03/07/2020 1353   GFRNONAA 95 07/11/2008 1616   GFRAA 115 07/11/2008 1616    BNP No results found for: BNP  ProBNP No results found for: PROBNP  Specialty Problems      Pulmonary Problems   Asthma, mild intermittent    Allergy vaccine build-up after restart 10/15/12 Dulera MDI - d/c due to palpitations On Flovent - d/c Proventil prn Dr Halford Chessman      Obstructive sleep apnea    Dr Halford Chessman Unattended home sleep study "[place] [facility]" 22/97/9892-[id number] obstructive sleep apnea. AHI of 14.1 per hour, moderate to loud snoring, desaturation nadir of 81% on room air. CPAP download shows good compliance and control with effective pressure 11.  They discussed changing to a more convenient DME company in [place] such as [employer]. Became non-compliant- CPAP taken away.   NPSG 03/17/12 AHI 2.5/ hr. WNL. CPAP not indicated.      Seasonal and perennial allergic rhinitis    Stopped long term allergy vaccine in May, 2013. Resumed vaccine 10/25/12 [facility]      Acute sinusitis    3/17      Snoring    Dr Halford Chessman      Allergic rhinitis    Chronic  Nasal  rinse Claritin         Allergies  Allergen Reactions  . Doxycycline Nausea And Vomiting  . Hydrocodone     Nausea from cough syrup  . Other     Dog, cat, nuts, grass, trees    Immunization History  Administered Date(s) Administered  . Influenza Split 02/11/2011, 02/24/2012  . Influenza,inj,Quad PF,6+ Mos 02/16/2013, 01/26/2014, 01/25/2015, 01/16/2016, 02/03/2017, 02/02/2018, 01/14/2019, 01/21/2020  . PFIZER SARS-COV-2 Vaccination 08/20/2019, 09/13/2019  . Pneumococcal Conjugate-13 03/06/2016  . Tdap 03/08/2015    Past Medical History:  Diagnosis Date  . Anxiety   . Constipation   .  Deafness   . Depression   . Diabetes mellitus ([place])   . Gastroparesis   . Hyperlipemia   . Hypothyroidism     Tobacco History: Social History   Tobacco Use  Smoking Status Never Smoker  Smokeless Tobacco Never Used   Counseling given: Not Answered   Continue to not smoke  Outpatient Encounter Medications as of 03/27/2020  Medication Sig  . albuterol (PROVENTIL HFA;VENTOLIN HFA) 108 (90 Base) MCG/ACT inhaler Inhale 1-2 puffs into the lungs every 6 (six) hours as needed for wheezing or shortness of breath.  Marland Kitchen albuterol (PROVENTIL) (2.5 MG/3ML) 0.083% nebulizer solution Take 3 mLs (2.5 mg total) by nebulization every 6 (six) hours as needed for wheezing or shortness of breath.  . ALPRAZolam (XANAX) 1 MG tablet Take 0.5-1 tablets (0.5-1 mg total) by mouth daily as needed for anxiety.  . AMBULATORY NON FORMULARY MEDICATION Medication Name: Nitroglycerin gel 0.125 % apply pea size amount to rectum twice daily x 8 weeks  . ARIPiprazole (ABILIFY) 15 MG tablet TAKE 1 TABLET(15 MG) BY MOUTH DAILY  . Cholecalciferol (VITAMIN D3) 2000 units capsule Take 1 capsule (2,000 Units total) by mouth daily.  . diclofenac (VOLTAREN) 75 MG EC tablet TAKE 1 TABLET(75 MG) BY MOUTH TWICE DAILY  . diclofenac sodium (VOLTAREN) 1 % GEL Apply topically at bedtime.  Marland Kitchen erythromycin ophthalmic ointment Place 1  application into the left eye at bedtime. L eye qhs x 5 days  . fluticasone (FLOVENT HFA) 110 MCG/ACT inhaler Inhale 2 puffs into the lungs 2 (two) times daily.  . Fluticasone Propionate, Inhal, (FLOVENT DISKUS) 100 MCG/BLIST AEPB Inhale 1 Inhaler into the lungs 2 (two) times a day.  . furosemide (LASIX) 20 MG tablet Take 1-2 tablets (20-40 mg total) by mouth daily as needed for edema.  . gabapentin (NEURONTIN) 300 MG capsule TAKE 1 CAPSULE(300 MG) BY MOUTH THREE TIMES DAILY FOR 7 DAYS  . levothyroxine (SYNTHROID) 75 MCG tablet TAKE 1 TABLET(75 MCG) BY MOUTH DAILY  . loratadine (CLARITIN) 10 MG tablet Take 1 tablet (10 mg total) by mouth daily.  Marland Kitchen NITRO-BID 2 % ointment Place 1 application rectally. As needed  . Olopatadine HCl (PATADAY) 0.2 % SOLN Apply to eye.  Marland Kitchen Plecanatide (TRULANCE) 3 MG TABS Take 3 mg by mouth daily.  . potassium chloride (KLOR-CON) 8 MEQ tablet TAKE 1 TABLET(8 MEQ) BY MOUTH DAILY   No facility-administered encounter medications on file as of 03/27/2020.     Review of Systems  Review of Systems  Constitutional: Negative for activity change, fatigue and fever.  HENT: Positive for hearing loss. Negative for sinus pressure, sinus pain and sore throat.   Respiratory: Negative for cough, shortness of breath and wheezing.   Cardiovascular: Negative for chest pain and palpitations.  Gastrointestinal: Negative for diarrhea, nausea and vomiting.  Musculoskeletal: Negative for arthralgias.  Neurological: Negative for dizziness.  Psychiatric/Behavioral: Negative for sleep disturbance. The patient is not nervous/anxious.      Physical Exam  BP 128/82 (BP Location: Left Arm, Cuff Size: Normal)   Pulse 80   Temp 98.5 F (36.9 C) (Oral)   Ht 5\' 5"  (1.651 m)   Wt 190 lb 9.6 oz (86.5 kg)   SpO2 97%   BMI 31.72 kg/m   Wt Readings from Last 5 Encounters:  03/27/20 190 lb 9.6 oz (86.5 kg)  03/07/20 202 lb 4.8 oz (91.8 kg)  09/06/19 201 lb (91.2 kg)  06/07/19 198 lb  (89.8 kg)  02/16/19 202 lb 8 oz (91.9  kg)    BMI Readings from Last 5 Encounters:  03/27/20 31.72 kg/m  03/07/20 33.66 kg/m  09/06/19 33.45 kg/m  06/07/19 32.95 kg/m  02/16/19 33.70 kg/m     Physical Exam Vitals and nursing note reviewed.  Constitutional:      General: She is not in acute distress.    Appearance: Normal appearance. She is obese.  HENT:     Head: Normocephalic and atraumatic.     Right Ear: Tympanic membrane, ear canal and external ear normal. There is no impacted cerumen.     Left Ear: Tympanic membrane, ear canal and external ear normal. There is no impacted cerumen.     Nose: Nose normal. No congestion.     Mouth/Throat:     Mouth: Mucous membranes are moist.     Pharynx: Oropharynx is clear.     Comments: MP 2 Eyes:     Pupils: Pupils are equal, round, and reactive to light.  Cardiovascular:     Rate and Rhythm: Normal rate and regular rhythm.     Pulses: Normal pulses.     Heart sounds: Normal heart sounds. No murmur heard.   Pulmonary:     Effort: Pulmonary effort is normal. No respiratory distress.     Breath sounds: Normal breath sounds. No decreased air movement. No decreased breath sounds, wheezing or rales.  Musculoskeletal:     Cervical back: Normal range of motion.  Skin:    General: Skin is warm and dry.     Capillary Refill: Capillary refill takes less than 2 seconds.  Neurological:     General: No focal deficit present.     Mental Status: She is alert and oriented to person, place, and time. Mental status is at baseline.     Gait: Gait normal.  Psychiatric:        Mood and Affect: Mood normal.        Behavior: Behavior normal.        Thought Content: Thought content normal.        Judgment: Judgment normal.       Assessment & Plan:   At risk for obstructive sleep apnea March/2018-sleep study negative for sleep apnea Patient with persistent worsening sleep quality Elevated hemoglobin BMI 31.72 History of restless leg  syndrome, has since resolved Stop bang score: 3-4  Discussion: Patient would require a sleep study.  The barrier to getting this scheduled the patient is primary caregiver for her mom.  She should be able to obtain a sleep study if able to be scheduled on Sunday night.  I have placed this order.  Given her history of restless leg syndrome we will try to obtain a split-night sleep study to further evaluate both symptoms.  Patient has concerns about whether or not a home sleep study could be appropriately performed given her caregiving requirements that sometimes cause her to wake up.  Plan: We will order split-night sleep study Tentative follow-up with our office in 3 months, can move up appointment sooner once split-night sleep study is completed  Healthcare maintenance Up-to-date with Covid vaccines Up-to-date with flu vaccine  Snoring Plan: We will order split-night sleep study  Poor sleep hygiene Patient reporting poor sleep quality.  This is likely multifactorial given the fact the patient is 24/7 caregiver for her mom who is a total care patient.  Patient with history of restless leg syndrome although she denies any symptoms today.  Patient also with history of insomnia although she typically falls asleep within  the first 30 minutes per patient today.  She does have a negative sleep study from March/2018.  We will plan on repeating this to further evaluate  Plan: Encourage patient to limit screen time Encourage patient to set parameters on when she typically falls asleep and wakes up We will order split-night sleep study to further evaluate  Hearing loss Chronic Using ASL interpreter during exam    Return in about 3 months (around 06/27/2020), or if symptoms worsen or fail to improve, for Follow up with Dr. Halford Chessman.   Lauraine Rinne, NP 03/27/2020   This appointment required 42 minutes of patient care (this includes precharting, chart review, review of results, face-to-face  care, etc.).

## 2020-03-27 NOTE — Assessment & Plan Note (Signed)
Up-to-date with Covid vaccines Up-to-date with flu vaccine

## 2020-03-27 NOTE — Patient Instructions (Addendum)
You were seen today by Lauraine Rinne, NP  for:   1. At risk for obstructive sleep apnea 2. Snoring   - Split night study; Future  Work on improving sleep hygiene:   Sleep habits   Keep a sleep diary to help you and your health care provider figure out what could be causing your insomnia. Write down: ? When you sleep. ? When you wake up during the night. ? How well you sleep. ? How rested you feel the next day. ? Any side effects of medicines you are taking. ? What you eat and drink.  Make your bedroom a dark, comfortable place where it is easy to fall asleep. ? Put up shades or blackout curtains to block light from outside. ? Use a white noise machine to block noise. ? Keep the temperature cool.  Limit screen use before bedtime. This includes: ? Watching TV. ? Using your smartphone, tablet, or computer.  Stick to a routine that includes going to bed and waking up at the same times every day and night. This can help you fall asleep faster. Consider making a quiet activity, such as reading, part of your nighttime routine.  Try to avoid taking naps during the day so that you sleep better at night.  Get out of bed if you are still awake after 15 minutes of trying to sleep. Keep the lights down, but try reading or doing a quiet activity. When you feel sleepy, go back to bed.  3. Healthcare maintenance  Up to date with covid19 vaccinations   Up to date with flu vaccine    We recommend today:  Orders Placed This Encounter  Procedures  . Split night study    Standing Status:   Future    Standing Expiration Date:   03/27/2021    Scheduling Instructions:     Needs Sunday night scheduled    Order Specific Question:   Where should this test be performed:    Answer:   [facility]   Orders Placed This Encounter  Procedures  . Split night study   No orders of the defined types were placed in this encounter.   Follow Up:    Return in about 3 months (around  06/27/2020), or if symptoms worsen or fail to improve, for Follow up with Dr. Halford Chessman.   Notification of test results are managed in the following manner: If there are  any recommendations or changes to the  plan of care discussed in office today,  we will contact you and let you know what they are. If you do not hear from [place], then your results are normal and you can view them through your  MyChart account , or a letter will be sent to you. Thank you again for trusting [place] with your care  - Thank you, [facility]    It is flu season:   >>> Best ways to protect herself from the flu: Receive the yearly flu vaccine, practice good hand hygiene washing with soap and also using hand sanitizer when available, eat a nutritious meals, get adequate rest, hydrate appropriately       Please contact the office if your symptoms worsen or you have concerns that you are not improving.   Thank you for choosing [facility] for your healthcare, and for allowing [place] to partner with you on your healthcare journey. I am thankful to be able to provide care to you today.   Wyn Quaker FNP-C

## 2020-03-27 NOTE — Assessment & Plan Note (Signed)
Plan: We will order split-night sleep study

## 2020-03-27 NOTE — Progress Notes (Signed)
  Subjective:  Patient ID: Heidi Good, female    DOB: 03/02/1961,  MRN: [id number]  Chief Complaint  Patient presents with  . Routine Post Op    Pin removal    DOS: 02/18/2020 Procedure: Left foot removal of deep implant, hammertoe correction 2, 3  59 y.o. female returns for post-op check.  Here today with ASL interpreter.  She is doing quite well.  She is very ready for the pins to come out  Review of Systems: Negative except as noted in the HPI. Denies N/V/F/Ch.   Objective:  There were no vitals filed for this visit. There is no height or weight on file to calculate BMI. Constitutional Well developed. Well nourished.  Vascular Foot warm and well perfused. Capillary refill normal to all digits.   Neurologic Normal speech. Oriented to person, place, and time. Epicritic sensation to light touch grossly present bilaterally.  Dermatologic  incisions are well-healed without signs of infection  Orthopedic:  Minimal tenderness to palpation noted about the surgical site.  Expected postoperative edema.  Kirschner wires intact in position   Radiographs: 3 weightbearing views of the left foot taken, no change in Kirschner wire position there is good consolidation of the arthrodesis site of the second toe, the third appears to have not consolidated Assessment:   1. Hammertoe of left foot    Plan:  Patient was evaluated and treated and all questions answered.  S/p foot surgery left -Progressing as expected post-operatively. -XR: Discussed with her that the third toe PIPJ arthrodesis site appears to have lost some consolidation and may become a pseudoarthrosis.  She does not currently have any pain here and hopefully this will be asymptomatic for her. -WB Status: WBAT in regular shoes -Resume regular bathing -New x-rays at next visit   Return in about 2 months (around 05/24/2020).

## 2020-03-27 NOTE — Assessment & Plan Note (Signed)
Patient reporting poor sleep quality.  This is likely multifactorial given the fact the patient is 24/7 caregiver for her mom who is a total care patient.  Patient with history of restless leg syndrome although she denies any symptoms today.  Patient also with history of insomnia although she typically falls asleep within the first 30 minutes per patient today.  She does have a negative sleep study from March/2018.  We will plan on repeating this to further evaluate  Plan: Encourage patient to limit screen time Encourage patient to set parameters on when she typically falls asleep and wakes up We will order split-night sleep study to further evaluate

## 2020-03-27 NOTE — Assessment & Plan Note (Signed)
March/2018-sleep study negative for sleep apnea Patient with persistent worsening sleep quality Elevated hemoglobin BMI 31.72 History of restless leg syndrome, has since resolved Stop bang score: 3-4  Discussion: Patient would require a sleep study.  The barrier to getting this scheduled the patient is primary caregiver for her mom.  She should be able to obtain a sleep study if able to be scheduled on Sunday night.  I have placed this order.  Given her history of restless leg syndrome we will try to obtain a split-night sleep study to further evaluate both symptoms.  Patient has concerns about whether or not a home sleep study could be appropriately performed given her caregiving requirements that sometimes cause her to wake up.  Plan: We will order split-night sleep study Tentative follow-up with our office in 3 months, can move up appointment sooner once split-night sleep study is completed

## 2020-03-28 ENCOUNTER — Ambulatory Visit: Payer: Medicare Other | Admitting: Psychiatry

## 2020-03-28 NOTE — Progress Notes (Signed)
Reviewed and agree with assessment/plan.   [name], MD [facility] 03/28/2020, 8:57 AM Pager:  [phone]

## 2020-05-03 ENCOUNTER — Encounter (HOSPITAL_BASED_OUTPATIENT_CLINIC_OR_DEPARTMENT_OTHER): Payer: Federal, State, Local not specified - PPO | Admitting: Pulmonary Disease

## 2020-05-24 ENCOUNTER — Other Ambulatory Visit: Payer: Self-pay

## 2020-05-24 ENCOUNTER — Encounter: Payer: Self-pay | Admitting: Psychiatry

## 2020-05-24 ENCOUNTER — Ambulatory Visit (INDEPENDENT_AMBULATORY_CARE_PROVIDER_SITE_OTHER): Payer: Medicare Other | Admitting: Psychiatry

## 2020-05-24 DIAGNOSIS — F200 Paranoid schizophrenia: Secondary | ICD-10-CM | POA: Diagnosis not present

## 2020-05-24 MED ORDER — ARIPIPRAZOLE 15 MG PO TABS: 15.0000 mg | ORAL_TABLET | Freq: Every day | ORAL | 1 refills | Status: DC

## 2020-05-24 NOTE — Progress Notes (Signed)
Heidi Good [id number] [birth year] 60 y.o.  Subjective:   Patient ID:  Heidi Good is a 60 y.o. (DOB [birth year]) female.  Chief Complaint:  Chief Complaint  Patient presents with  . Follow-up  . Anxiety    Depression        Associated symptoms include no decreased concentration and no suicidal ideas.  patient is deaf and seen with an interpreter.  She has notes. [facility] presents to the office today for follow-up of schizophrenia.  Last seen November, 2020.  She continues on Abilify 15 mg daily and Xanax is the only psychiatric meds and no meds were changed.  Patient seen today the with the assistance of a translator due to her deafness.  Stress M declining health and she feels more nervous and worried.  Helps mother.  Takes alprazolam occ with little benefit.  Sleep is affected by care of mother.     M gets confused and dx dementia and sleeping a lot. B comes and helps.  She pays the bills and not the mother.    No longer has funny feelings and doesn't need the Xanax.  Denies feeling afraid at home.  B will move in when M passes away.  Has several dogs.  [place] Shepherds make her feel safer.  Abilify 15 mg increase helped her.     In November bothered bc could see something and feel something walk by outside and dog acted funny.  Then brother came over and checked things.  She felt someone was walking around outside.  Says this happened 7 times by mid DEC.  She feels in danger from this incidents.  Never saw anyone but believed there was someone there.  Police came by and didn't find anything.  Thinks it's an old friend.  M having problems with dementia.  Pt takes care of her.  Pt helps her with meds and cooks for her and helps bathe hers.  Rare Xanax is less helpful.  Patient reports stable mood and denies depressed or irritable moods.  Patient denies any recent difficulty with anxiety except as noted. No fear.  Patient denies difficulty with sleep initiation or  maintenance. Denies appetite disturbance.  Patient reports that energy and motivation have been good.  Patient denies any difficulty with concentration.  Patient denies any suicidal ideation.  15 mg Abilify helped her feel better, calmer than did the 10 mg Abilify.  No SE and claims compliance.  Denies voices, depression, anger on this dosage.  Sleep good 8 hours.  No drowsiness. Watches news.  05/24/20 appt noted: seen with ASL interpreter M in Hospice with poor health.  Not depressed but sad.  Not anxious.   B is helping some.   Mother's sister Heidi Good is helping.  F died 9 years ago.  Loves Mother.  Will live alone in the house after her mother dies. Patient reports stable mood and denies depressed or irritable moods.  Patient denies any recent difficulty with anxiety.  Patient denies difficulty with sleep initiation or maintenance. Denies appetite disturbance.  Patient reports that energy and motivation have been good.  Patient denies any difficulty with concentration.  Patient denies any suicidal ideation. No fear.  Pt drives without problems.  Other psychiatric medications tried include perphenazine which she did not like, loxapine which she did not like, and Saphris.  She complained that it gave her strange feeling under the tongue.  She has taken other psychiatric medications in the past but she does not know  the names of them and we have not been able to get the records. Abilify 15 benefit.  Review of Systems:  Review of Systems  HENT:       Deaf  Respiratory: Negative for cough and shortness of breath.   Cardiovascular: Negative for chest pain and palpitations.  Neurological: Negative for tremors and weakness.  Psychiatric/Behavioral: Positive for depression. Negative for agitation, behavioral problems, confusion, decreased concentration, dysphoric mood, hallucinations, self-injury, sleep disturbance and suicidal ideas. The patient is not nervous/anxious and is not hyperactive.      Medications: I have reviewed the patient's current medications.  Current Outpatient Medications  Medication Sig Dispense Refill  . albuterol (PROVENTIL HFA;VENTOLIN HFA) 108 (90 Base) MCG/ACT inhaler Inhale 1-2 puffs into the lungs every 6 (six) hours as needed for wheezing or shortness of breath. 1 Inhaler 5  . albuterol (PROVENTIL) (2.5 MG/3ML) 0.083% nebulizer solution Take 3 mLs (2.5 mg total) by nebulization every 6 (six) hours as needed for wheezing or shortness of breath. 75 mL 5  . ALPRAZolam (XANAX) 1 MG tablet Take 0.5-1 tablets (0.5-1 mg total) by mouth daily as needed for anxiety. 30 tablet 2  . AMBULATORY NON FORMULARY MEDICATION Medication Name: Nitroglycerin gel 0.125 % apply pea size amount to rectum twice daily x 8 weeks 30 g 0  . ARIPiprazole (ABILIFY) 15 MG tablet Take 1 tablet (15 mg total) by mouth daily. 90 tablet 1  . Cholecalciferol (VITAMIN D3) 2000 units capsule Take 1 capsule (2,000 Units total) by mouth daily. 100 capsule 3  . diclofenac (VOLTAREN) 75 MG EC tablet TAKE 1 TABLET(75 MG) BY MOUTH TWICE DAILY 50 tablet 2  . diclofenac sodium (VOLTAREN) 1 % GEL Apply topically at bedtime.    Marland Kitchen erythromycin ophthalmic ointment Place 1 application into the left eye at bedtime. L eye qhs x 5 days 3.5 g 0  . fluticasone (FLOVENT HFA) 110 MCG/ACT inhaler Inhale 2 puffs into the lungs 2 (two) times daily. 1 Inhaler 12  . Fluticasone Propionate, Inhal, (FLOVENT DISKUS) 100 MCG/BLIST AEPB Inhale 1 Inhaler into the lungs 2 (two) times a day. 3 each 3  . furosemide (LASIX) 20 MG tablet Take 1-2 tablets (20-40 mg total) by mouth daily as needed for edema. 30 tablet 3  . levothyroxine (SYNTHROID) 75 MCG tablet TAKE 1 TABLET(75 MCG) BY MOUTH DAILY 90 tablet 3  . loratadine (CLARITIN) 10 MG tablet Take 1 tablet (10 mg total) by mouth daily. 90 tablet 3  . NITRO-BID 2 % ointment Place 1 application rectally. As needed    . Olopatadine HCl 0.2 % SOLN Apply to eye.    Marland Kitchen Plecanatide  (TRULANCE) 3 MG TABS Take 3 mg by mouth daily. 30 tablet 3  . potassium chloride (KLOR-CON) 8 MEQ tablet TAKE 1 TABLET(8 MEQ) BY MOUTH DAILY 90 tablet 3   No current facility-administered medications for this visit.    Medication Side Effects: None  Allergies:  Allergies  Allergen Reactions  . Doxycycline Nausea And Vomiting  . Hydrocodone     Nausea from cough syrup  . Other     Dog, cat, nuts, grass, trees    Past Medical History:  Diagnosis Date  . Anxiety   . Constipation   . Deafness   . Depression   . Diabetes mellitus ([place])   . Gastroparesis   . Hyperlipemia   . Hypothyroidism     Family History  Problem Relation Age of Onset  . Diabetes Mother   . Colon polyps  Mother        over 150 polyps detected  . Heart disease Father   . Multiple myeloma Father   . Emphysema Father   . Heart attack Father   . Liver cancer Maternal Grandmother        not primary source, spread to her liver  . Heart attack Maternal Grandfather   . Emphysema Paternal Grandfather   . Colon cancer Neg Hx   . Esophageal cancer Neg Hx   . Rectal cancer Neg Hx   . Stomach cancer Neg Hx     Social History   Socioeconomic History  . Marital status: Divorced    Spouse name: Not on file  . Number of children: 0  . Years of education: Not on file  . Highest education level: Not on file  Occupational History  . Occupation: disablied    Employer: RETIRED  Tobacco Use  . Smoking status: Never Smoker  . Smokeless tobacco: Never Used  Vaping Use  . Vaping Use: Never used  Substance and Sexual Activity  . Alcohol use: Never  . Drug use: Never  . Sexual activity: Not on file  Other Topics Concern  . Not on file  Social History Narrative  . Not on file   Social Determinants of Health   Financial Resource Strain: Not on file  Food Insecurity: Not on file  Transportation Needs: Not on file  Physical Activity: Not on file  Stress: Not on file  Social Connections: Not on file   Intimate Partner Violence: Not on file   She drives.  Still lives with her neighbor.  Past Medical History, Surgical history, Social history, and Family history were reviewed and updated as appropriate.   Please see review of systems for further details on the patient's review from today.   Objective:   Physical Exam:  There were no vitals taken for this visit.  Physical Exam Constitutional:      General: She is not in acute distress.    Appearance: She is well-developed.  Musculoskeletal:        General: No deformity.  Neurological:     Mental Status: She is alert and oriented to person, place, and time.     Motor: No tremor.     Coordination: Coordination normal.     Gait: Gait normal.  Psychiatric:        Attention and Perception: She is attentive. She does not perceive auditory hallucinations.        Mood and Affect: Mood is not anxious or depressed. Affect is not labile, blunt, angry or inappropriate.        Speech: Speech normal.        Behavior: Behavior normal. Behavior is not agitated, aggressive or hyperactive. Behavior is cooperative.        Thought Content: Thought content is not paranoid. Thought content does not include homicidal or suicidal ideation. Thought content does not include homicidal or suicidal plan.        Cognition and Memory: Cognition normal.        Judgment: Judgment normal.     Comments: Insight fair. No auditory or visual hallucinations. No delusions.  Pleasant     Lab Review:     Component Value Date/Time   NA 137 03/07/2020 1353   K 3.6 03/07/2020 1353   CL 100 03/07/2020 1353   CO2 30 03/07/2020 1353   GLUCOSE 95 03/07/2020 1353   BUN 7 03/07/2020 1353   CREATININE 0.75 03/07/2020 1353  CALCIUM 9.5 03/07/2020 1353   PROT 7.3 03/07/2020 1353   ALBUMIN 4.0 03/07/2020 1353   AST 29 03/07/2020 1353   ALT 26 03/07/2020 1353   ALKPHOS 110 03/07/2020 1353   BILITOT 0.7 03/07/2020 1353   GFRNONAA 95 07/11/2008 1616   GFRAA 115  07/11/2008 1616       Component Value Date/Time   WBC 10.7 (H) 03/07/2020 1353   RBC 5.23 (H) 03/07/2020 1353   HGB 15.6 (H) 03/07/2020 1353   HCT 45.8 03/07/2020 1353   PLT 184.0 03/07/2020 1353   MCV 87.7 03/07/2020 1353   MCHC 34.0 03/07/2020 1353   RDW 13.0 03/07/2020 1353   LYMPHSABS 3.2 03/07/2020 1353   MONOABS 0.9 03/07/2020 1353   EOSABS 0.3 03/07/2020 1353   BASOSABS 0.1 03/07/2020 1353    No results found for: POCLITH, LITHIUM   No results found for: PHENYTOIN, PHENOBARB, VALPROATE, CBMZ   .res Assessment: Plan:    Jaria was seen today for follow-up and anxiety.  Diagnoses and all orders for this visit:  Paranoid schizophrenia ([place]) -     ARIPiprazole (ABILIFY) 15 MG tablet; Take 1 tablet (15 mg total) by mouth daily.    Greater than 50% of face to face time with patient and an interpretor for 30 mins was spent on counseling and coordination of care. We discussed potential metabolic side effects associated with atypical antipsychotics, as well as potential risk for movement side effects. Advised pt to contact office if movement side effects occur.   Resolution of paranoid feelings.  She is agreed to continue Abilify 15 mg which she has taken for many years in the past to good effect.  She had a brief period of time where she was convinced it was not the right medicine for her.  We were not able to find any other medications that she felt satisfied taking.  It does not not appear that she has active psychosis at the moment.  She has a history of repeated paranoid psychotic episodes.  During some of these she could become agitated and aggressive with her mother.  She agrees that this is a good medicine for her  No SE.  Her labs are followed by her primary care doctor. Normal glucose and Hgb A1C, cholesterol 241 late October 2021.     Dx OSA but not using CPAP.  Encouraged to do so.  She is open to considering it.  Has reduced Xanax to 0.5-1 mg prn anxiety per her  request.  And uses rarely bc needed less.   Disc SE risk including sedation.  We discussed the short-term risks associated with benzodiazepines including sedation and increased fall risk among others.  Discussed long-term side effect risk including dependence, potential withdrawal symptoms, and the potential eventual dose-related risk of dementia.  But recent studies from 2020 dispute this association between benzodiazepines and dementia risk. Newer studies in 2020 do not support an association with dementia.   This appointment was 30 minutes with interpretor  Follow-up 4 months.    Lynder Parents MD, DFAPA  Please see After Visit Summary for patient specific instructions.  Future Appointments  Date Time Provider [place]  09/05/2020  1:20 PM Plotnikov, Evie Lacks, MD [facility] None  09/20/2020  2:00 PM Cottle, Billey Co., MD [facility] None    No orders of the defined types were placed in this encounter.     -------------------------------

## 2020-05-25 ENCOUNTER — Encounter: Payer: Medicare Other | Admitting: Podiatry

## 2020-07-15 ENCOUNTER — Other Ambulatory Visit: Payer: Self-pay | Admitting: Psychiatry

## 2020-07-15 DIAGNOSIS — F200 Paranoid schizophrenia: Secondary | ICD-10-CM

## 2020-08-03 ENCOUNTER — Other Ambulatory Visit: Payer: Self-pay | Admitting: Internal Medicine

## 2020-08-03 DIAGNOSIS — Z1231 Encounter for screening mammogram for malignant neoplasm of breast: Secondary | ICD-10-CM

## 2020-09-05 ENCOUNTER — Encounter: Payer: Self-pay | Admitting: Internal Medicine

## 2020-09-05 ENCOUNTER — Ambulatory Visit (INDEPENDENT_AMBULATORY_CARE_PROVIDER_SITE_OTHER): Payer: Medicare Other | Admitting: Internal Medicine

## 2020-09-05 ENCOUNTER — Other Ambulatory Visit: Payer: Self-pay | Admitting: Internal Medicine

## 2020-09-05 ENCOUNTER — Other Ambulatory Visit: Payer: Self-pay

## 2020-09-05 VITALS — BP 152/70 | HR 66 | Temp 98.8°F | Ht 65.0 in | Wt 192.6 lb

## 2020-09-05 DIAGNOSIS — R739 Hyperglycemia, unspecified: Secondary | ICD-10-CM

## 2020-09-05 DIAGNOSIS — R232 Flushing: Secondary | ICD-10-CM

## 2020-09-05 DIAGNOSIS — F4321 Adjustment disorder with depressed mood: Secondary | ICD-10-CM | POA: Diagnosis not present

## 2020-09-05 DIAGNOSIS — E034 Atrophy of thyroid (acquired): Secondary | ICD-10-CM | POA: Diagnosis not present

## 2020-09-05 LAB — COMPREHENSIVE METABOLIC PANEL
ALT: 26 U/L (ref 0–35)
AST: 34 U/L (ref 0–37)
Albumin: 3.8 g/dL (ref 3.5–5.2)
Alkaline Phosphatase: 118 U/L — ABNORMAL HIGH (ref 39–117)
BUN: 7 mg/dL (ref 6–23)
CO2: 29 mEq/L (ref 19–32)
Calcium: 9.4 mg/dL (ref 8.4–10.5)
Chloride: 98 mEq/L (ref 96–112)
Creatinine, Ser: 0.68 mg/dL (ref 0.40–1.20)
GFR: 95.47 mL/min (ref >60.00)
Glucose, Bld: 80 mg/dL (ref 70–99)
Potassium: 3.3 mEq/L — ABNORMAL LOW (ref 3.5–5.1)
Sodium: 137 mEq/L (ref 135–145)
Total Bilirubin: 1 mg/dL (ref 0.2–1.2)
Total Protein: 7.7 g/dL (ref 6.0–8.3)

## 2020-09-05 LAB — HEMOGLOBIN A1C: Hgb A1c MFr Bld: 6 % (ref 4.6–6.5)

## 2020-09-05 LAB — TSH: TSH: 1.25 u[IU]/mL (ref 0.35–4.50)

## 2020-09-05 MED ORDER — LEVOTHYROXINE SODIUM 75 MCG PO TABS
ORAL_TABLET | ORAL | 3 refills | Status: DC
Start: 2020-09-05 — End: 2021-08-29

## 2020-09-05 MED ORDER — ESTRADIOL 0.5 MG PO TABS: 0.5000 mg | ORAL_TABLET | Freq: Every day | ORAL | 1 refills | Status: DC

## 2020-09-05 MED ORDER — POTASSIUM CHLORIDE ER 8 MEQ PO TBCR
EXTENDED_RELEASE_TABLET | ORAL | 3 refills | Status: DC
Start: 2020-09-05 — End: 2021-08-29

## 2020-09-05 NOTE — Progress Notes (Signed)
Subjective:  Patient ID: Heidi Good, female    DOB: April 11, 1961  Age: 60 y.o. MRN: [id number]  CC: Follow-up (6 month f/u- Pt states she need refill on Klor con & Levothyroxine. ? If she need to continue taking Estradiol not on med list, but she has been taking and if so will need new rx)   HPI MATISON NUCCIO presents for hypothyroidism, grief, HRT  Outpatient Medications Prior to Visit  Medication Sig Dispense Refill  . albuterol (PROVENTIL HFA;VENTOLIN HFA) 108 (90 Base) MCG/ACT inhaler Inhale 1-2 puffs into the lungs every 6 (six) hours as needed for wheezing or shortness of breath. 1 Inhaler 5  . albuterol (PROVENTIL) (2.5 MG/3ML) 0.083% nebulizer solution Take 3 mLs (2.5 mg total) by nebulization every 6 (six) hours as needed for wheezing or shortness of breath. 75 mL 5  . ALPRAZolam (XANAX) 1 MG tablet Take 0.5-1 tablets (0.5-1 mg total) by mouth daily as needed for anxiety. 30 tablet 2  . AMBULATORY NON FORMULARY MEDICATION Medication Name: Nitroglycerin gel 0.125 % apply pea size amount to rectum twice daily x 8 weeks 30 g 0  . ARIPiprazole (ABILIFY) 15 MG tablet TAKE 1 TABLET(15 MG) BY MOUTH DAILY 90 tablet 0  . Cholecalciferol (VITAMIN D3) 2000 units capsule Take 1 capsule (2,000 Units total) by mouth daily. 100 capsule 3  . diclofenac (VOLTAREN) 75 MG EC tablet TAKE 1 TABLET(75 MG) BY MOUTH TWICE DAILY 50 tablet 2  . diclofenac sodium (VOLTAREN) 1 % GEL Apply topically at bedtime.    Marland Kitchen erythromycin ophthalmic ointment Place 1 application into the left eye at bedtime. L eye qhs x 5 days 3.5 g 0  . fluticasone (FLOVENT HFA) 110 MCG/ACT inhaler Inhale 2 puffs into the lungs 2 (two) times daily. 1 Inhaler 12  . Fluticasone Propionate, Inhal, (FLOVENT DISKUS) 100 MCG/BLIST AEPB Inhale 1 Inhaler into the lungs 2 (two) times a day. 3 each 3  . furosemide (LASIX) 20 MG tablet Take 1-2 tablets (20-40 mg total) by mouth daily as needed for edema. 30 tablet 3  . loratadine (CLARITIN) 10  MG tablet Take 1 tablet (10 mg total) by mouth daily. 90 tablet 3  . NITRO-BID 2 % ointment Place 1 application rectally. As needed    . Olopatadine HCl 0.2 % SOLN Apply to eye.    Marland Kitchen Plecanatide (TRULANCE) 3 MG TABS Take 3 mg by mouth daily. 30 tablet 3  . estradiol (ESTRACE) 1 MG tablet Take 1 mg by mouth daily. Take 1 by mouth every day    . levothyroxine (SYNTHROID) 75 MCG tablet TAKE 1 TABLET(75 MCG) BY MOUTH DAILY 90 tablet 3  . potassium chloride (KLOR-CON) 8 MEQ tablet TAKE 1 TABLET(8 MEQ) BY MOUTH DAILY 90 tablet 3   No facility-administered medications prior to visit.    ROS: Review of Systems  Constitutional: Negative for activity change, appetite change, chills, fatigue and unexpected weight change.  HENT: Positive for hearing loss. Negative for congestion, mouth sores and sinus pressure.   Eyes: Negative for visual disturbance.  Respiratory: Negative for cough and chest tightness.   Gastrointestinal: Negative for abdominal pain and nausea.  Genitourinary: Negative for difficulty urinating, frequency and vaginal pain.  Musculoskeletal: Negative for back pain and gait problem.  Skin: Negative for pallor and rash.  Neurological: Negative for dizziness, tremors, weakness, numbness and headaches.  Psychiatric/Behavioral: Negative for confusion and sleep disturbance.    Objective:  BP (!) 152/70 (BP Location: Left Arm)   Pulse  66   Temp 98.8 F (37.1 C) (Oral)   Ht 5\' 5"  (1.651 m)   Wt 192 lb 9.6 oz (87.4 kg)   SpO2 97%   BMI 32.05 kg/m   BP Readings from Last 3 Encounters:  09/05/20 (!) 152/70  03/27/20 128/82  03/07/20 130/82    Wt Readings from Last 3 Encounters:  09/05/20 192 lb 9.6 oz (87.4 kg)  03/27/20 190 lb 9.6 oz (86.5 kg)  03/07/20 202 lb 4.8 oz (91.8 kg)    Physical Exam Constitutional:      General: She is not in acute distress.    Appearance: She is well-developed. She is obese.  HENT:     Head: Normocephalic.     Right Ear: External ear  normal.     Left Ear: External ear normal.     Nose: Nose normal.  Eyes:     General:        Right eye: No discharge.        Left eye: No discharge.     Conjunctiva/sclera: Conjunctivae normal.     Pupils: Pupils are equal, round, and reactive to light.  Neck:     Thyroid: No thyromegaly.     Vascular: No JVD.     Trachea: No tracheal deviation.  Cardiovascular:     Rate and Rhythm: Normal rate and regular rhythm.     Heart sounds: Normal heart sounds.  Pulmonary:     Effort: No respiratory distress.     Breath sounds: No stridor. No wheezing.  Abdominal:     General: Bowel sounds are normal. There is no distension.     Palpations: Abdomen is soft. There is no mass.     Tenderness: There is no abdominal tenderness. There is no guarding or rebound.  Musculoskeletal:        General: No tenderness.     Cervical back: Normal range of motion and neck supple.  Lymphadenopathy:     Cervical: No cervical adenopathy.  Skin:    Findings: No erythema or rash.  Neurological:     Cranial Nerves: No cranial nerve deficit.     Motor: No abnormal muscle tone.     Coordination: Coordination normal.     Deep Tendon Reflexes: Reflexes normal.  Psychiatric:        Behavior: Behavior normal.        Thought Content: Thought content normal.        Judgment: Judgment normal.     Lab Results  Component Value Date   WBC 10.7 (H) 03/07/2020   HGB 15.6 (H) 03/07/2020   HCT 45.8 03/07/2020   PLT 184.0 03/07/2020   GLUCOSE 95 03/07/2020   CHOL 185 03/07/2020   TRIG 241.0 (H) 03/07/2020   HDL 40.90 03/07/2020   LDLDIRECT 52.0 03/07/2020   LDLCALC 55 03/10/2015   ALT 26 03/07/2020   AST 29 03/07/2020   NA 137 03/07/2020   K 3.6 03/07/2020   CL 100 03/07/2020   CREATININE 0.75 03/07/2020   BUN 7 03/07/2020   CO2 30 03/07/2020   TSH 0.55 03/07/2020   HGBA1C 6.1 03/07/2020    MM 3D SCREEN BREAST BILATERAL  Result Date: 08/18/2019 CLINICAL DATA:  Screening. EXAM: DIGITAL SCREENING  BILATERAL MAMMOGRAM WITH TOMO AND CAD COMPARISON:  Previous exam(s). ACR Breast Density Category c: The breast tissue is heterogeneously dense, which may obscure small masses. FINDINGS: There are no findings suspicious for malignancy. Images were processed with CAD. IMPRESSION: No mammographic evidence of malignancy.  A result letter of this screening mammogram will be mailed directly to the patient. RECOMMENDATION: Screening mammogram in one year. (Code:SM-B-01Y) BI-RADS CATEGORY  1: Negative. Electronically Signed   By: Marin Olp M.D.   On: 08/18/2019 14:40    Assessment & Plan:   There are no diagnoses linked to this encounter.   Meds ordered this encounter  Medications  . levothyroxine (SYNTHROID) 75 MCG tablet    Sig: TAKE 1 TABLET(75 MCG) BY MOUTH DAILY    Dispense:  90 tablet    Refill:  3  . potassium chloride (KLOR-CON) 8 MEQ tablet    Sig: TAKE 1 TABLET(8 MEQ) BY MOUTH DAILY    Dispense:  90 tablet    Refill:  3  . estradiol (ESTRACE) 0.5 MG tablet    Sig: Take 1 tablet (0.5 mg total) by mouth daily.    Dispense:  30 tablet    Refill:  1     Follow-up: No follow-ups on file.  Walker Kehr, MD

## 2020-09-05 NOTE — Patient Instructions (Signed)
Taper off and stop Estardiol

## 2020-09-05 NOTE — Assessment & Plan Note (Signed)
Discussed.

## 2020-09-05 NOTE — Assessment & Plan Note (Signed)
Taper off and stop Estardiol 

## 2020-09-20 ENCOUNTER — Ambulatory Visit (INDEPENDENT_AMBULATORY_CARE_PROVIDER_SITE_OTHER): Payer: Medicare Other | Admitting: Psychiatry

## 2020-09-20 ENCOUNTER — Other Ambulatory Visit: Payer: Self-pay

## 2020-09-20 ENCOUNTER — Encounter: Payer: Self-pay | Admitting: Psychiatry

## 2020-09-20 DIAGNOSIS — F99 Mental disorder, not otherwise specified: Secondary | ICD-10-CM

## 2020-09-20 DIAGNOSIS — F200 Paranoid schizophrenia: Secondary | ICD-10-CM | POA: Diagnosis not present

## 2020-09-20 DIAGNOSIS — F5105 Insomnia due to other mental disorder: Secondary | ICD-10-CM | POA: Diagnosis not present

## 2020-09-20 MED ORDER — ARIPIPRAZOLE 15 MG PO TABS: 15.0000 mg | ORAL_TABLET | Freq: Every day | ORAL | 1 refills | Status: DC

## 2020-09-20 NOTE — Progress Notes (Signed)
Heidi Good [id number] 27-Sep-1960 60 y.o.  Subjective:   Patient ID:  Heidi Good is a 60 y.o. (DOB 05-21-1960) female.  Chief Complaint:  Chief Complaint  Patient presents with  . Follow-up  . Paranoid schizophrenia ([place])    Depression        Associated symptoms include no decreased concentration and no suicidal ideas.  patient is deaf and seen with an interpreter.  She has notes. Heidi Good [facility] presents to the office today for follow-up of schizophrenia.  Last seen November, 2020.  She continues on Abilify 15 mg daily and Xanax is the only psychiatric meds and no meds were changed.  Patient seen today the with the assistance of a translator due to her deafness.  Stress M declining health and she feels more nervous and worried.  Helps mother.  Takes alprazolam occ with little benefit.  Sleep is affected by care of mother.     M gets confused and dx dementia and sleeping a lot. B comes and helps.  She pays the bills and not the mother.    No longer has funny feelings and doesn't need the Xanax.  Denies feeling afraid at home.  B will move in when M passes away.  Has several dogs.  [place] Shepherds make her feel safer.  Abilify 15 mg increase helped her.     In November bothered bc could see something and feel something walk by outside and dog acted funny.  Then brother came over and checked things.  She felt someone was walking around outside.  Says this happened 7 times by mid DEC.  She feels in danger from this incidents.  Never saw anyone but believed there was someone there.  Police came by and didn't find anything.  Thinks it's an old friend.  M having problems with dementia.  Pt takes care of her.  Pt helps her with meds and cooks for her and helps bathe hers.  Rare Xanax is less helpful.  Patient reports stable mood and denies depressed or irritable moods.  Patient denies any recent difficulty with anxiety except as noted. No fear.  Patient denies difficulty with sleep  initiation or maintenance. Denies appetite disturbance.  Patient reports that energy and motivation have been good.  Patient denies any difficulty with concentration.  Patient denies any suicidal ideation.  15 mg Abilify helped her feel better, calmer than did the 10 mg Abilify.  No SE and claims compliance.  Denies voices, depression, anger on this dosage.  Sleep good 8 hours.  No drowsiness. Watches news.  05/24/20 appt noted: seen with ASL interpreter M in Hospice with poor health.  Not depressed but sad.  Not anxious.   B is helping some.   Mother's sister Heidi Good is helping.  F died 9 years ago.  Loves Mother.  Will live alone in the house after her mother dies. Patient reports stable mood and denies depressed or irritable moods.  Patient denies any recent difficulty with anxiety.  Patient denies difficulty with sleep initiation or maintenance. Denies appetite disturbance.  Patient reports that energy and motivation have been good.  Patient denies any difficulty with concentration.  Patient denies any suicidal ideation. No fear. Plan: No med changes  09/20/2020 appointment with the following noted: Seen with interpreter Healthy no Covid. M died. Going through process of grief.  Misses mother.  B helping a lot. No fearful or unusual anxiety.  2 dogs. I feel at peace.  No excess crying.  Sleep  is good.  No problems with Abilify 15 mg daily. Rare Xanax 0.5-1 mg. Pt drives without problems.  Other psychiatric medications tried include perphenazine which she did not like, loxapine which she did not like, and Saphris.  She complained that it gave her strange feeling under the tongue.  She has taken other psychiatric medications in the past but she does not know the names of them and we have not been able to get the records. Abilify 15 benefit.  Review of Systems:  Review of Systems  HENT:       Deaf  Respiratory: Negative for cough and shortness of breath.   Cardiovascular: Negative for chest pain  and palpitations.  Neurological: Negative for tremors and weakness.  Psychiatric/Behavioral: Positive for depression. Negative for agitation, behavioral problems, confusion, decreased concentration, dysphoric mood, hallucinations, self-injury, sleep disturbance and suicidal ideas. The patient is not nervous/anxious and is not hyperactive.     Medications: I have reviewed the patient's current medications.  Current Outpatient Medications  Medication Sig Dispense Refill  . albuterol (PROVENTIL HFA;VENTOLIN HFA) 108 (90 Base) MCG/ACT inhaler Inhale 1-2 puffs into the lungs every 6 (six) hours as needed for wheezing or shortness of breath. 1 Inhaler 5  . albuterol (PROVENTIL) (2.5 MG/3ML) 0.083% nebulizer solution Take 3 mLs (2.5 mg total) by nebulization every 6 (six) hours as needed for wheezing or shortness of breath. 75 mL 5  . ALPRAZolam (XANAX) 1 MG tablet Take 0.5-1 tablets (0.5-1 mg total) by mouth daily as needed for anxiety. 30 tablet 2  . AMBULATORY NON FORMULARY MEDICATION Medication Name: Nitroglycerin gel 0.125 % apply pea size amount to rectum twice daily x 8 weeks 30 g 0  . diclofenac (VOLTAREN) 75 MG EC tablet TAKE 1 TABLET(75 MG) BY MOUTH TWICE DAILY 50 tablet 2  . diclofenac sodium (VOLTAREN) 1 % GEL Apply topically at bedtime.    Marland Kitchen erythromycin ophthalmic ointment Place 1 application into the left eye at bedtime. L eye qhs x 5 days 3.5 g 0  . estradiol (ESTRACE) 0.5 MG tablet Take 1 tablet (0.5 mg total) by mouth daily. 30 tablet 1  . Fluticasone Propionate, Inhal, (FLOVENT DISKUS) 100 MCG/BLIST AEPB Inhale 1 Inhaler into the lungs 2 (two) times a day. 3 each 3  . furosemide (LASIX) 20 MG tablet Take 1-2 tablets (20-40 mg total) by mouth daily as needed for edema. 30 tablet 3  . levothyroxine (SYNTHROID) 75 MCG tablet TAKE 1 TABLET(75 MCG) BY MOUTH DAILY 90 tablet 3  . loratadine (CLARITIN) 10 MG tablet Take 1 tablet (10 mg total) by mouth daily. 90 tablet 3  . NITRO-BID 2 %  ointment Place 1 application rectally. As needed    . Olopatadine HCl 0.2 % SOLN Apply to eye.    . potassium chloride (KLOR-CON) 8 MEQ tablet TAKE 1 TABLET(8 MEQ) BY MOUTH DAILY 90 tablet 3  . ARIPiprazole (ABILIFY) 15 MG tablet Take 1 tablet (15 mg total) by mouth daily. 90 tablet 1  . Cholecalciferol (VITAMIN D3) 2000 units capsule Take 1 capsule (2,000 Units total) by mouth daily. (Patient not taking: Reported on 09/20/2020) 100 capsule 3  . fluticasone (FLOVENT HFA) 110 MCG/ACT inhaler Inhale 2 puffs into the lungs 2 (two) times daily. (Patient not taking: Reported on 09/20/2020) 1 Inhaler 12  . Plecanatide (TRULANCE) 3 MG TABS Take 3 mg by mouth daily. (Patient not taking: Reported on 09/20/2020) 30 tablet 3   No current facility-administered medications for this visit.    Medication  Side Effects: None  Allergies:  Allergies  Allergen Reactions  . Doxycycline Nausea And Vomiting  . Hydrocodone     Nausea from cough syrup  . Other     Dog, cat, nuts, grass, trees    Past Medical History:  Diagnosis Date  . Anxiety   . Constipation   . Deafness   . Depression   . Diabetes mellitus ([place])   . Gastroparesis   . Hyperlipemia   . Hypothyroidism     Family History  Problem Relation Age of Onset  . Diabetes Mother   . Colon polyps Mother        over 150 polyps detected  . Heart disease Father   . Multiple myeloma Father   . Emphysema Father   . Heart attack Father   . Liver cancer Maternal Grandmother        not primary source, spread to her liver  . Heart attack Maternal Grandfather   . Emphysema Paternal Grandfather   . Colon cancer Neg Hx   . Esophageal cancer Neg Hx   . Rectal cancer Neg Hx   . Stomach cancer Neg Hx     Social History   Socioeconomic History  . Marital status: Divorced    Spouse name: Not on file  . Number of children: 0  . Years of education: Not on file  . Highest education level: Not on file  Occupational History  . Occupation:  disablied    Employer: RETIRED  Tobacco Use  . Smoking status: Never Smoker  . Smokeless tobacco: Never Used  Vaping Use  . Vaping Use: Never used  Substance and Sexual Activity  . Alcohol use: Never  . Drug use: Never  . Sexual activity: Not on file  Other Topics Concern  . Not on file  Social History Narrative  . Not on file   Social Determinants of Health   Financial Resource Strain: Not on file  Food Insecurity: Not on file  Transportation Needs: Not on file  Physical Activity: Not on file  Stress: Not on file  Social Connections: Not on file  Intimate Partner Violence: Not on file   She drives.  Still lives with her neighbor.  Past Medical History, Surgical history, Social history, and Family history were reviewed and updated as appropriate.   Please see review of systems for further details on the patient's review from today.   Objective:   Physical Exam:  There were no vitals taken for this visit.  Physical Exam Constitutional:      General: She is not in acute distress.    Appearance: She is well-developed.  Musculoskeletal:        General: No deformity.  Neurological:     Mental Status: She is alert and oriented to person, place, and time.     Motor: No tremor.     Coordination: Coordination normal.     Gait: Gait normal.  Psychiatric:        Attention and Perception: She is attentive. She does not perceive auditory hallucinations.        Mood and Affect: Mood is not anxious or depressed. Affect is not labile, blunt, angry or inappropriate.        Speech: Speech normal.        Behavior: Behavior normal. Behavior is not agitated, aggressive or hyperactive. Behavior is cooperative.        Thought Content: Thought content is not paranoid. Thought content does not include homicidal or suicidal ideation.  Thought content does not include homicidal or suicidal plan.        Cognition and Memory: Cognition normal.        Judgment: Judgment normal.     Comments:  Insight fair. No auditory or visual hallucinations. No delusions.  Pleasant     Lab Review:     Component Value Date/Time   NA 137 09/05/2020 1352   K 3.3 (L) 09/05/2020 1352   CL 98 09/05/2020 1352   CO2 29 09/05/2020 1352   GLUCOSE 80 09/05/2020 1352   BUN 7 09/05/2020 1352   CREATININE 0.68 09/05/2020 1352   CALCIUM 9.4 09/05/2020 1352   PROT 7.7 09/05/2020 1352   ALBUMIN 3.8 09/05/2020 1352   AST 34 09/05/2020 1352   ALT 26 09/05/2020 1352   ALKPHOS 118 (H) 09/05/2020 1352   BILITOT 1.0 09/05/2020 1352   GFRNONAA 95 07/11/2008 1616   GFRAA 115 07/11/2008 1616       Component Value Date/Time   WBC 10.7 (H) 03/07/2020 1353   RBC 5.23 (H) 03/07/2020 1353   HGB 15.6 (H) 03/07/2020 1353   HCT 45.8 03/07/2020 1353   PLT 184.0 03/07/2020 1353   MCV 87.7 03/07/2020 1353   MCHC 34.0 03/07/2020 1353   RDW 13.0 03/07/2020 1353   LYMPHSABS 3.2 03/07/2020 1353   MONOABS 0.9 03/07/2020 1353   EOSABS 0.3 03/07/2020 1353   BASOSABS 0.1 03/07/2020 1353    No results found for: POCLITH, LITHIUM   No results found for: PHENYTOIN, PHENOBARB, VALPROATE, CBMZ   .res Assessment: Plan:    Erminia was seen today for follow-up and paranoid schizophrenia ([facility]).  Diagnoses and all orders for this visit:  Paranoid schizophrenia ([place]) -     ARIPiprazole (ABILIFY) 15 MG tablet; Take 1 tablet (15 mg total) by mouth daily.  Insomnia due to other mental disorder    Greater than 50% of face to face time with patient and an interpretor for 30 mins was spent on counseling and coordination of care. We discussed potential metabolic side effects associated with atypical antipsychotics, as well as potential risk for movement side effects. Advised pt to contact office if movement side effects occur.   No paranoid feelings with good response to Abilify 15 mg.  No change indicated..  She is agreed to continue Abilify 15 mg which she has taken for many years in the past to good effect.  She had  a brief period of time where she was convinced it was not the right medicine for her.  We were not able to find any other medications that she felt satisfied taking.  It does not not appear that she has active psychosis at the moment.  She has a history of repeated paranoid psychotic episodes.  During some of these she could become agitated and aggressive with her mother.  She agrees that this is a good medicine for her  No SE.  Her labs are followed by her primary care doctor. Normal glucose and Hgb A1C, cholesterol 241 late October 2021.     Dx OSA but not using CPAP.  Encouraged to do so.  She is open to considering it.  Reviewed labs with her from 08/2020.    Has reduced Xanax to 0.5-1 mg prn anxiety per her request.  And uses rarely bc needed less.   Disc SE risk including sedation.  We discussed the short-term risks associated with benzodiazepines including sedation and increased fall risk among others.  Discussed long-term side effect risk  including dependence, potential withdrawal symptoms, and the potential eventual dose-related risk of dementia.  But recent studies from 2020 dispute this association between benzodiazepines and dementia risk. Newer studies in 2020 do not support an association with dementia.   This appointment was 30 minutes with interpretor  Follow-up 6 months.    Lynder Parents MD, DFAPA  Please see After Visit Summary for patient specific instructions.  Future Appointments  Date Time Provider [place]  09/26/2020 11:00 AM [facility] MM 3 [facility]  01/24/2021  2:00 PM Cottle, Billey Co., MD [facility] None  03/06/2021  1:20 PM Plotnikov, Evie Lacks, MD [facility] None    No orders of the defined types were placed in this encounter.     -------------------------------

## 2020-09-26 ENCOUNTER — Ambulatory Visit
Admission: RE | Admit: 2020-09-26 | Discharge: 2020-09-26 | Disposition: A | Discharge status: Home / Self Care | Payer: Medicare Other | Source: Ambulatory Visit | Attending: Internal Medicine | Admitting: Internal Medicine

## 2020-09-26 ENCOUNTER — Other Ambulatory Visit: Payer: Self-pay

## 2020-09-26 DIAGNOSIS — Z1231 Encounter for screening mammogram for malignant neoplasm of breast: Secondary | ICD-10-CM

## 2020-10-12 ENCOUNTER — Other Ambulatory Visit: Payer: Self-pay | Admitting: Internal Medicine

## 2020-10-12 ENCOUNTER — Other Ambulatory Visit: Payer: Self-pay | Admitting: Psychiatry

## 2020-10-12 DIAGNOSIS — F200 Paranoid schizophrenia: Secondary | ICD-10-CM

## 2020-10-24 DIAGNOSIS — D1801 Hemangioma of skin and subcutaneous tissue: Secondary | ICD-10-CM | POA: Diagnosis not present

## 2020-10-24 DIAGNOSIS — L918 Other hypertrophic disorders of the skin: Secondary | ICD-10-CM | POA: Diagnosis not present

## 2020-10-24 DIAGNOSIS — L820 Inflamed seborrheic keratosis: Secondary | ICD-10-CM | POA: Diagnosis not present

## 2020-10-24 DIAGNOSIS — L821 Other seborrheic keratosis: Secondary | ICD-10-CM | POA: Diagnosis not present

## 2020-10-24 DIAGNOSIS — L814 Other melanin hyperpigmentation: Secondary | ICD-10-CM | POA: Diagnosis not present

## 2020-12-18 ENCOUNTER — Encounter: Payer: Self-pay | Admitting: Internal Medicine

## 2020-12-18 ENCOUNTER — Ambulatory Visit (INDEPENDENT_AMBULATORY_CARE_PROVIDER_SITE_OTHER): Payer: Medicare Other | Admitting: Internal Medicine

## 2020-12-18 ENCOUNTER — Other Ambulatory Visit: Payer: Self-pay

## 2020-12-18 VITALS — BP 138/64 | HR 49 | Temp 98.6°F | Ht 65.0 in | Wt 192.8 lb

## 2020-12-18 DIAGNOSIS — M7062 Trochanteric bursitis, left hip: Secondary | ICD-10-CM | POA: Diagnosis not present

## 2020-12-18 MED ORDER — MELOXICAM 15 MG PO TABS: 15.0000 mg | ORAL_TABLET | Freq: Every day | ORAL | 2 refills | Status: DC | PRN

## 2020-12-18 NOTE — Progress Notes (Signed)
Subjective:  Patient ID: Heidi Good, female    DOB: 18-Sep-1960  Age: 60 y.o. MRN: [id number]  CC: Hip Pain (Left side x's 1 month)   HPI ANZAL COFFEY presents for L hip pain x 1 month - severe; used Tylenol and a topical cream. Not better She is here w/her sign language interpreter  Outpatient Medications Prior to Visit  Medication Sig Dispense Refill   albuterol (PROVENTIL HFA;VENTOLIN HFA) 108 (90 Base) MCG/ACT inhaler Inhale 1-2 puffs into the lungs every 6 (six) hours as needed for wheezing or shortness of breath. 1 Inhaler 5   albuterol (PROVENTIL) (2.5 MG/3ML) 0.083% nebulizer solution Take 3 mLs (2.5 mg total) by nebulization every 6 (six) hours as needed for wheezing or shortness of breath. 75 mL 5   ALPRAZolam (XANAX) 1 MG tablet Take 0.5-1 tablets (0.5-1 mg total) by mouth daily as needed for anxiety. 30 tablet 2   AMBULATORY NON FORMULARY MEDICATION Medication Name: Nitroglycerin gel 0.125 % apply pea size amount to rectum twice daily x 8 weeks 30 g 0   ARIPiprazole (ABILIFY) 15 MG tablet TAKE 1 TABLET(15 MG) BY MOUTH DAILY 90 tablet 1   diclofenac (VOLTAREN) 75 MG EC tablet TAKE 1 TABLET(75 MG) BY MOUTH TWICE DAILY 50 tablet 2   diclofenac sodium (VOLTAREN) 1 % GEL Apply topically at bedtime.     estradiol (ESTRACE) 0.5 MG tablet Take 1 tablet (0.5 mg total) by mouth daily. 30 tablet 1   fluticasone (FLOVENT HFA) 110 MCG/ACT inhaler Inhale 2 puffs into the lungs 2 (two) times daily. 1 Inhaler 12   Fluticasone Propionate, Inhal, (FLOVENT DISKUS) 100 MCG/BLIST AEPB Inhale 1 Inhaler into the lungs 2 (two) times a day. 3 each 3   furosemide (LASIX) 20 MG tablet Take 1-2 tablets (20-40 mg total) by mouth daily as needed for edema. 30 tablet 3   levothyroxine (SYNTHROID) 75 MCG tablet TAKE 1 TABLET(75 MCG) BY MOUTH DAILY 90 tablet 3   loratadine (CLARITIN) 10 MG tablet Take 1 tablet (10 mg total) by mouth daily. 90 tablet 3   Olopatadine HCl 0.2 % SOLN Apply to eye.      Plecanatide (TRULANCE) 3 MG TABS Take 3 mg by mouth daily. 30 tablet 3   potassium chloride (KLOR-CON) 8 MEQ tablet TAKE 1 TABLET(8 MEQ) BY MOUTH DAILY 90 tablet 3   Cholecalciferol (VITAMIN D3) 2000 units capsule Take 1 capsule (2,000 Units total) by mouth daily. (Patient not taking: No sig reported) 100 capsule 3   erythromycin ophthalmic ointment Place 1 application into the left eye at bedtime. L eye qhs x 5 days (Patient not taking: Reported on 12/18/2020) 3.5 g 0   NITRO-BID 2 % ointment Place 1 application rectally. As needed (Patient not taking: Reported on 12/18/2020)     No facility-administered medications prior to visit.    ROS: Review of Systems  Constitutional:  Negative for fever and unexpected weight change.  Musculoskeletal:  Positive for arthralgias and gait problem. Negative for back pain and myalgias.  Psychiatric/Behavioral:  Positive for sleep disturbance. The patient is not nervous/anxious.    Objective:  BP 138/64 (BP Location: Left Arm)   Pulse (!) 49   Temp 98.6 F (37 C) (Oral)   Ht '5\' 5"'$  (1.651 m)   Wt 192 lb 12.8 oz (87.5 kg)   SpO2 97%   BMI 32.08 kg/m   BP Readings from Last 3 Encounters:  12/18/20 138/64  09/05/20 (!) 152/70  03/27/20 128/82  Wt Readings from Last 3 Encounters:  12/18/20 192 lb 12.8 oz (87.5 kg)  09/05/20 192 lb 9.6 oz (87.4 kg)  03/27/20 190 lb 9.6 oz (86.5 kg)    Physical Exam Constitutional:      Appearance: She is obese.  Musculoskeletal:        General: Tenderness present.  Neurological:     Mental Status: She is oriented to person, place, and time.     Sensory: No sensory deficit.     Motor: No weakness.     Gait: Gait normal.    L lat hip is very tender    Procedure Note :     Procedure : Joint Injection, L   hip   Indication:  Trochanteric bursitis with refractory  chronic pain.   Risks including unsuccessful procedure , bleeding, infection, bruising, skin atrophy, "steroid flare-up" and others were  explained to the patient in detail as well as the benefits. Informed consent was obtained verbally.  Tthe patient was placed in a comfortable lateral decubitus position. The point of maximal tenderness was identified. Skin was prepped with Betadine and alcohol. Then, a 5 cc syringe with a 1.5 inch long 23-gauge needle was used for a bursa injection.. The needle was advanced  Into the bursa. I injected the bursa with 4 mL of 2% lidocaine and 80 mg of Depo-Medrol .  Band-Aid was applied.   Tolerated well. Complications: None. Good pain relief following the procedure.   Postprocedure instructions :    A Band-Aid should be left on for 12 hours. Injection therapy is not a cure itself. It is used in conjunction with other modalities. You can use nonsteroidal anti-inflammatories like ibuprofen , hot and cold compresses. Rest is recommended in the next 24 hours. You need to report immediately  if fever, chills or any signs of infection develop.  Lab Results  Component Value Date   WBC 10.7 (H) 03/07/2020   HGB 15.6 (H) 03/07/2020   HCT 45.8 03/07/2020   PLT 184.0 03/07/2020   GLUCOSE 80 09/05/2020   CHOL 185 03/07/2020   TRIG 241.0 (H) 03/07/2020   HDL 40.90 03/07/2020   LDLDIRECT 52.0 03/07/2020   LDLCALC 55 03/10/2015   ALT 26 09/05/2020   AST 34 09/05/2020   NA 137 09/05/2020   K 3.3 (L) 09/05/2020   CL 98 09/05/2020   CREATININE 0.68 09/05/2020   BUN 7 09/05/2020   CO2 29 09/05/2020   TSH 1.25 09/05/2020   HGBA1C 6.0 09/05/2020    MM 3D SCREEN BREAST BILATERAL  Result Date: 09/26/2020 CLINICAL DATA:  Screening. EXAM: DIGITAL SCREENING BILATERAL MAMMOGRAM WITH TOMOSYNTHESIS AND CAD TECHNIQUE: Bilateral screening digital craniocaudal and mediolateral oblique mammograms were obtained. Bilateral screening digital breast tomosynthesis was performed. The images were evaluated with computer-aided detection. COMPARISON:  Previous exam(s). ACR Breast Density Category c: The breast tissue is  heterogeneously dense, which may obscure small masses. FINDINGS: There are no findings suspicious for malignancy. The images were evaluated with computer-aided detection. IMPRESSION: No mammographic evidence of malignancy. A result letter of this screening mammogram will be mailed directly to the patient. RECOMMENDATION: Screening mammogram in one year. (Code:SM-B-01Y) BI-RADS CATEGORY  1: Negative. Electronically Signed   By: Valentino Saxon MD   On: 09/26/2020 13:28    Assessment & Plan:    Walker Kehr, MD

## 2020-12-18 NOTE — Patient Instructions (Signed)
Postprocedure instructions :    A Band-Aid should be left on for 12 hours. Injection therapy is not a cure itself. It is used in conjunction with other modalities. You can use nonsteroidal anti-inflammatories like ibuprofen , hot and cold compresses. Rest is recommended in the next 24 hours. You need to report immediately  if fever, chills or any signs of infection develop.    Hip Bursitis  Hip bursitis is swelling of one or more fluid-filled sacs (bursae) in your hip joint. This condition can cause pain, and your symptoms may comeand go over time. What are the causes? Repeated use of your hip muscles. Injury to the hip. Weak butt muscles. Bone spurs. Infection. In some cases, the cause may not be known. What increases the risk? You are more likely to develop this condition if: You had a past hip injury or hip surgery. You have a condition, such as arthritis, gout, diabetes, or thyroid disease. You have spine problems. You have one leg that is shorter than the other. You run a lot or do long-distance running. You play sports where there is a risk of injury or falling, such as football, martial arts, or skiing. What are the signs or symptoms? Symptoms may come and go, and they often include: Pain in the hip or groin area. Pain may get worse when you move your hip. Tenderness and swelling of the hip. In rare cases, the bursa may become infected. If this happens, you may get afever, as well as have warmth and redness in the hip area. How is this treated? This condition is treated by: Resting your hip. Icing your hip. Wrapping the hip area with an elastic bandage (compression wrap). Keeping the hip raised. Other treatments may include medicine, draining fluid out of the bursa, orusing crutches, a cane, or a walker. Surgery may be needed, but this is rare. Long-term treatment may include doing exercises to help your strength and flexibility. It may also include lifestyle changes  like losing weight to lessenthe strain on your hip. Follow these instructions at home: Managing pain, stiffness, and swelling     If told, put ice on the painful area. Put ice in a plastic bag. Place a towel between your skin and the bag. Leave the ice on for 20 minutes, 2-3 times a day. Raise your hip by putting a pillow under your hips while you lie down. Stop if you feel pain. If told, put heat on the affected area. Do this as often as told by your doctor. Use a moist heat pack or a heating pad as told by your doctor. Place a towel between your skin and the heat source. Leave the heat on for 20-30 minutes. Take off the heat if your skin turns bright red. This is very important if you are unable to feel pain, heat, or cold. You may have a greater risk of getting burned. Activity Do not use your hip to support your body weight until your doctor says that you can. Use crutches, a cane, or a walker as told by your doctor. If the affected leg is one that you use to drive, ask your doctor if it is safe to drive. Rest and protect your hip as much as you can until you feel better. Return to your normal activities as told by your doctor. Ask your doctor what activities are safe for you. Do exercises as told by your doctor. General instructions Take over-the-counter and prescription medicines only as told by  your doctor. Gently rub and stretch your injured area as often as is comfortable. Wear elastic bandages only as told by your doctor. If one of your legs is shorter than the other, get fitted for a shoe insert or orthotic. Keep a healthy weight. Follow instructions from your doctor. Keep all follow-up visits as told by your doctor. This is important. How is this prevented? Exercise regularly, as told by your doctor. Wear the right shoes for the sport you play. Warm up and stretch before being active. Cool down and stretch after being active. Take breaks often from repeated  activity. Avoid activities that bother your hip or cause pain. Avoid sitting down for a long time. Where to find more information American Academy [employer]: orthoinfo.aaos.org Contact a doctor if: You have a fever. You have new symptoms. You have trouble walking or doing everyday activities. You have pain that gets worse or does not get better with medicine. Your skin around your hip is red. You get a feeling of warmth in your hip area. Get help right away if: You cannot move your hip. You have very bad pain. You cannot control the muscles in your feet. Summary Hip bursitis is swelling of one or more fluid-filled sacs (bursae) in your hip joint. Symptoms often come and go over time. This condition is often treated by resting and icing the hip. It also may help to keep the area raised and wrapped in an elastic bandage. Other treatments may be needed. This information is not intended to replace advice given to you by your health care provider. Make sure you discuss any questions you have with your healthcare provider. Document Revised: 03/01/2019 Document Reviewed: 01/05/2018 Elsevier Patient Education  2022 [employer].

## 2020-12-18 NOTE — Assessment & Plan Note (Signed)
New Will inject w/steroids per pt's request Ref to PT Ice/heat Given meloxicam 15 mg to use qd x 2 wks, then prn

## 2021-01-05 ENCOUNTER — Telehealth: Payer: Self-pay | Admitting: Internal Medicine

## 2021-01-05 NOTE — Telephone Encounter (Signed)
Called to clarify referral to Physical Medicine. Current referral is only for PT which is not a service at there office. Needing to clarify if the PT was the only thing patient needed to be seen for.   Please advise.  Best callback #: [phone]

## 2021-01-05 NOTE — Telephone Encounter (Signed)
Per chart MD only ordered PT. If they do not provide PT services [facility] will have to schedule w/ someone else.Marland KitchenJohny Chess

## 2021-01-10 NOTE — Telephone Encounter (Signed)
Trulance 3 mg  Approved today 01-10-2021 Your PA request has been approved.  Additional information will be provided in the approval communication. (Message 1145)

## 2021-01-16 ENCOUNTER — Ambulatory Visit (INDEPENDENT_AMBULATORY_CARE_PROVIDER_SITE_OTHER): Payer: Medicare Other

## 2021-01-16 ENCOUNTER — Other Ambulatory Visit: Payer: Self-pay

## 2021-01-16 DIAGNOSIS — Z23 Encounter for immunization: Secondary | ICD-10-CM

## 2021-01-24 ENCOUNTER — Ambulatory Visit (INDEPENDENT_AMBULATORY_CARE_PROVIDER_SITE_OTHER): Payer: Medicare Other | Admitting: Psychiatry

## 2021-01-24 ENCOUNTER — Other Ambulatory Visit: Payer: Self-pay

## 2021-01-24 ENCOUNTER — Encounter: Payer: Self-pay | Admitting: Psychiatry

## 2021-01-24 DIAGNOSIS — F99 Mental disorder, not otherwise specified: Secondary | ICD-10-CM | POA: Diagnosis not present

## 2021-01-24 DIAGNOSIS — F200 Paranoid schizophrenia: Secondary | ICD-10-CM | POA: Diagnosis not present

## 2021-01-24 DIAGNOSIS — F5105 Insomnia due to other mental disorder: Secondary | ICD-10-CM

## 2021-01-24 MED ORDER — ARIPIPRAZOLE 15 MG PO TABS: 15.0000 mg | ORAL_TABLET | Freq: Every day | ORAL | 1 refills | Status: DC

## 2021-01-24 NOTE — Progress Notes (Signed)
Heidi Good [id number] [birth year] 60 y.o.  Subjective:   Patient ID:  Heidi Good is a 60 y.o. (DOB 10/21/1960) female.  Chief Complaint:  No chief complaint on file.   Depression        Associated symptoms include no decreased concentration and no suicidal ideas. patient is deaf and seen with an interpreter.  She has notes. Evelene Roussin [facility] presents to the office today for follow-up of schizophrenia.  Last seen November, 2020.  She continues on Abilify 15 mg daily and Xanax is the only psychiatric meds and no meds were changed.  Patient seen today the with the assistance of a translator due to her deafness.  Stress M declining health and she feels more nervous and worried.  Helps mother.  Takes alprazolam occ with little benefit.  Sleep is affected by care of mother.     M gets confused and dx dementia and sleeping a lot. B comes and helps.  She pays the bills and not the mother.    No longer has funny feelings and doesn't need the Xanax.  Denies feeling afraid at home.  B will move in when M passes away.  Has several dogs.  [place] Shepherds make her feel safer.  Abilify 15 mg increase helped her.     In November bothered bc could see something and feel something walk by outside and dog acted funny.  Then brother came over and checked things.  She felt someone was walking around outside.  Says this happened 7 times by mid DEC.  She feels in danger from this incidents.  Never saw anyone but believed there was someone there.  Police came by and didn't find anything.  Thinks it's an old friend.  M having problems with dementia.  Pt takes care of her.  Pt helps her with meds and cooks for her and helps bathe hers.  Rare Xanax is less helpful.  Patient reports stable mood and denies depressed or irritable moods.  Patient denies any recent difficulty with anxiety except as noted. No fear.  Patient denies difficulty with sleep initiation or maintenance. Denies appetite disturbance.   Patient reports that energy and motivation have been good.  Patient denies any difficulty with concentration.  Patient denies any suicidal ideation.  15 mg Abilify helped her feel better, calmer than did the 10 mg Abilify.  No SE and claims compliance.  Denies voices, depression, anger on this dosage.  Sleep good 8 hours.  No drowsiness. Watches news.  05/24/20 appt noted: seen with ASL interpreter M in Hospice with poor health.  Not depressed but sad.  Not anxious.   B is helping some.   Mother's sister Lelon Frohlich is helping.  F died 9 years ago.  Loves Mother.  Will live alone in the house after her mother dies. Patient reports stable mood and denies depressed or irritable moods.  Patient denies any recent difficulty with anxiety.  Patient denies difficulty with sleep initiation or maintenance. Denies appetite disturbance.  Patient reports that energy and motivation have been good.  Patient denies any difficulty with concentration.  Patient denies any suicidal ideation. No fear. Plan: No med changes  09/20/2020 appointment with the following noted: Seen with interpreter Healthy no Covid. M died. Going through process of grief.  Misses mother.  B helping a lot. No fearful or unusual anxiety.  2 dogs. I feel at peace.  No excess crying.  Sleep is good.  No problems with Abilify 15 mg daily. Rare  Xanax 0.5-1 mg. Pt drives without problems. Plan: No med changes  01/24/2021 appointment with the following noted: Rare Xanax with little anxiety.  Peace after mother died. Takes Abilify consistently 15 mg nightly. Sleeps well after 8 hours. No sig fear nor anxiety.  B checks on her.  Supportive local community.  Has dogs. No SE.   Painting and doing things around the house.  No problems driving.   Other psychiatric medications tried include perphenazine which she did not like, loxapine which she did not like, and Saphris.  She complained that it gave her strange feeling under the tongue.  She has taken  other psychiatric medications in the past but she does not know the names of them and we have not been able to get the records. Abilify 15 benefit.  Review of Systems:  Review of Systems  HENT:         Deaf  Respiratory:  Negative for shortness of breath.   Cardiovascular:  Negative for chest pain and palpitations.  Musculoskeletal:  Positive for arthralgias.  Neurological:  Negative for tremors and weakness.  Psychiatric/Behavioral:  Negative for agitation, behavioral problems, confusion, decreased concentration, dysphoric mood, hallucinations, self-injury, sleep disturbance and suicidal ideas. The patient is not nervous/anxious and is not hyperactive.    Medications: I have reviewed the patient's current medications.  Current Outpatient Medications  Medication Sig Dispense Refill   albuterol (PROVENTIL HFA;VENTOLIN HFA) 108 (90 Base) MCG/ACT inhaler Inhale 1-2 puffs into the lungs every 6 (six) hours as needed for wheezing or shortness of breath. 1 Inhaler 5   albuterol (PROVENTIL) (2.5 MG/3ML) 0.083% nebulizer solution Take 3 mLs (2.5 mg total) by nebulization every 6 (six) hours as needed for wheezing or shortness of breath. 75 mL 5   ALPRAZolam (XANAX) 1 MG tablet Take 0.5-1 tablets (0.5-1 mg total) by mouth daily as needed for anxiety. 30 tablet 2   AMBULATORY NON FORMULARY MEDICATION Medication Name: Nitroglycerin gel 0.125 % apply pea size amount to rectum twice daily x 8 weeks 30 g 0   diclofenac (VOLTAREN) 75 MG EC tablet TAKE 1 TABLET(75 MG) BY MOUTH TWICE DAILY 50 tablet 2   diclofenac sodium (VOLTAREN) 1 % GEL Apply topically at bedtime.     estradiol (ESTRACE) 0.5 MG tablet Take 1 tablet (0.5 mg total) by mouth daily. 30 tablet 1   fluticasone (FLOVENT HFA) 110 MCG/ACT inhaler Inhale 2 puffs into the lungs 2 (two) times daily. 1 Inhaler 12   Fluticasone Propionate, Inhal, (FLOVENT DISKUS) 100 MCG/BLIST AEPB Inhale 1 Inhaler into the lungs 2 (two) times a day. 3 each 3    furosemide (LASIX) 20 MG tablet Take 1-2 tablets (20-40 mg total) by mouth daily as needed for edema. 30 tablet 3   levothyroxine (SYNTHROID) 75 MCG tablet TAKE 1 TABLET(75 MCG) BY MOUTH DAILY 90 tablet 3   loratadine (CLARITIN) 10 MG tablet Take 1 tablet (10 mg total) by mouth daily. 90 tablet 3   meloxicam (MOBIC) 15 MG tablet Take 1 tablet (15 mg total) by mouth daily as needed for pain. 30 tablet 2   Olopatadine HCl 0.2 % SOLN Apply to eye.     Plecanatide (TRULANCE) 3 MG TABS Take 3 mg by mouth daily. 30 tablet 3   potassium chloride (KLOR-CON) 8 MEQ tablet TAKE 1 TABLET(8 MEQ) BY MOUTH DAILY 90 tablet 3   ARIPiprazole (ABILIFY) 15 MG tablet Take 1 tablet (15 mg total) by mouth daily. 90 tablet 1   No current facility-administered  medications for this visit.    Medication Side Effects: None  Allergies:  Allergies  Allergen Reactions   Doxycycline Nausea And Vomiting   Hydrocodone     Nausea from cough syrup   Other     Dog, cat, nuts, grass, trees    Past Medical History:  Diagnosis Date   Anxiety    Constipation    Deafness    Depression    Diabetes mellitus ([place])    Gastroparesis    Hyperlipemia    Hypothyroidism     Family History  Problem Relation Age of Onset   Diabetes Mother    Colon polyps Mother        over 37 polyps detected   Heart disease Father    Multiple myeloma Father    Emphysema Father    Heart attack Father    Liver cancer Maternal Grandmother        not primary source, spread to her liver   Heart attack Maternal Grandfather    Emphysema Paternal Grandfather    Colon cancer Neg Hx    Esophageal cancer Neg Hx    Rectal cancer Neg Hx    Stomach cancer Neg Hx     Social History   Socioeconomic History   Marital status: Divorced    Spouse name: Not on file   Number of children: 0   Years of education: Not on file   Highest education level: Not on file  Occupational History   Occupation: disablied    Employer: RETIRED  Tobacco Use    Smoking status: Never   Smokeless tobacco: Never  Vaping Use   Vaping Use: Never used  Substance and Sexual Activity   Alcohol use: Never   Drug use: Never   Sexual activity: Not on file  Other Topics Concern   Not on file  Social History Narrative   Not on file   Social Determinants of Health   Financial Resource Strain: Not on file  Food Insecurity: Not on file  Transportation Needs: Not on file  Physical Activity: Not on file  Stress: Not on file  Social Connections: Not on file  Intimate Partner Violence: Not on file   She drives.  Still lives with her neighbor.  Past Medical History, Surgical history, Social history, and Family history were reviewed and updated as appropriate.   Please see review of systems for further details on the patient's review from today.   Objective:   Physical Exam:  There were no vitals taken for this visit.  Physical Exam Constitutional:      General: She is not in acute distress.    Appearance: She is well-developed.  Musculoskeletal:        General: No deformity.  Neurological:     Mental Status: She is alert and oriented to person, place, and time.     Motor: No tremor.     Coordination: Coordination normal.     Gait: Gait normal.  Psychiatric:        Attention and Perception: She is attentive. She does not perceive auditory hallucinations.        Mood and Affect: Mood is not anxious or depressed. Affect is not labile, blunt or inappropriate.        Speech: Speech normal.        Behavior: Behavior normal. Behavior is not agitated, aggressive or hyperactive. Behavior is cooperative.        Thought Content: Thought content is not paranoid. Thought content does not  include homicidal or suicidal ideation. Thought content does not include homicidal or suicidal plan.        Cognition and Memory: Cognition normal.        Judgment: Judgment normal.     Comments: Insight fair. No auditory or visual hallucinations. No delusions.   Pleasant    Lab Review:     Component Value Date/Time   NA 137 09/05/2020 1352   K 3.3 (L) 09/05/2020 1352   CL 98 09/05/2020 1352   CO2 29 09/05/2020 1352   GLUCOSE 80 09/05/2020 1352   BUN 7 09/05/2020 1352   CREATININE 0.68 09/05/2020 1352   CALCIUM 9.4 09/05/2020 1352   PROT 7.7 09/05/2020 1352   ALBUMIN 3.8 09/05/2020 1352   AST 34 09/05/2020 1352   ALT 26 09/05/2020 1352   ALKPHOS 118 (H) 09/05/2020 1352   BILITOT 1.0 09/05/2020 1352   GFRNONAA 95 07/11/2008 1616   GFRAA 115 07/11/2008 1616       Component Value Date/Time   WBC 10.7 (H) 03/07/2020 1353   RBC 5.23 (H) 03/07/2020 1353   HGB 15.6 (H) 03/07/2020 1353   HCT 45.8 03/07/2020 1353   PLT 184.0 03/07/2020 1353   MCV 87.7 03/07/2020 1353   MCHC 34.0 03/07/2020 1353   RDW 13.0 03/07/2020 1353   LYMPHSABS 3.2 03/07/2020 1353   MONOABS 0.9 03/07/2020 1353   EOSABS 0.3 03/07/2020 1353   BASOSABS 0.1 03/07/2020 1353    No results found for: POCLITH, LITHIUM   No results found for: PHENYTOIN, PHENOBARB, VALPROATE, CBMZ   .res Assessment: Plan:    Diagnoses and all orders for this visit:  Insomnia due to other mental disorder  Paranoid schizophrenia ([place]) -     ARIPiprazole (ABILIFY) 15 MG tablet; Take 1 tablet (15 mg total) by mouth daily.   Greater than 50% of face to face time with patient and an interpretor for 30 mins was spent on counseling and coordination of care. We discussed potential metabolic side effects associated with atypical antipsychotics, as well as potential risk for movement side effects. Advised pt to contact office if movement side effects occur.   No paranoid feelings with good response to Abilify 15 mg.  No change indicated..  She is agreed to continue Abilify 15 mg which she has taken for many years in the past to good effect.  She had a brief period of time where she was convinced it was not the right medicine for her.  We were not able to find any other medications that she  felt satisfied taking.  It does not not appear that she has active psychosis at the moment.  She has a history of repeated paranoid psychotic episodes.  During some of these she could become agitated and aggressive with her mother.  She agrees that this is a good medicine for her  No SE.  Her labs are followed by her primary care doctor. Normal glucose and Hgb A1C, cholesterol 241 late October 2021.     Dx OSA but not using CPAP.  Encouraged to do so.  She is open to considering it.  Reviewed labs with her from 08/2020.    Has reduced Xanax to 0.5-1 mg prn anxiety per her request.  And uses rarely bc needed less.   Disc SE risk including sedation.  We discussed the short-term risks associated with benzodiazepines including sedation and increased fall risk among others.  Discussed long-term side effect risk including dependence, potential withdrawal symptoms, and the potential  eventual dose-related risk of dementia.  But recent studies from 2020 dispute this association between benzodiazepines and dementia risk. Newer studies in 2020 do not support an association with dementia.   Follow-up 6 months.    Lynder Parents MD, DFAPA  Please see After Visit Summary for patient specific instructions.  Future Appointments  Date Time Provider [place]  03/06/2021  1:20 PM Plotnikov, Evie Lacks, MD [facility] None  07/26/2021  3:00 PM Cottle, Billey Co., MD [facility] None    No orders of the defined types were placed in this encounter.     -------------------------------

## 2021-03-06 ENCOUNTER — Ambulatory Visit (INDEPENDENT_AMBULATORY_CARE_PROVIDER_SITE_OTHER): Payer: Medicare Other | Admitting: Internal Medicine

## 2021-03-06 ENCOUNTER — Other Ambulatory Visit: Payer: Self-pay

## 2021-03-06 ENCOUNTER — Encounter: Payer: Self-pay | Admitting: Internal Medicine

## 2021-03-06 VITALS — BP 151/82 | HR 60 | Temp 99.1°F | Ht 65.0 in | Wt 190.8 lb

## 2021-03-06 DIAGNOSIS — J4520 Mild intermittent asthma, uncomplicated: Secondary | ICD-10-CM

## 2021-03-06 DIAGNOSIS — J4521 Mild intermittent asthma with (acute) exacerbation: Secondary | ICD-10-CM

## 2021-03-06 DIAGNOSIS — R7303 Prediabetes: Secondary | ICD-10-CM | POA: Diagnosis not present

## 2021-03-06 DIAGNOSIS — J3081 Allergic rhinitis due to animal (cat) (dog) hair and dander: Secondary | ICD-10-CM

## 2021-03-06 DIAGNOSIS — R030 Elevated blood-pressure reading, without diagnosis of hypertension: Secondary | ICD-10-CM | POA: Insufficient documentation

## 2021-03-06 DIAGNOSIS — J45901 Unspecified asthma with (acute) exacerbation: Secondary | ICD-10-CM | POA: Insufficient documentation

## 2021-03-06 DIAGNOSIS — E034 Atrophy of thyroid (acquired): Secondary | ICD-10-CM | POA: Diagnosis not present

## 2021-03-06 LAB — COMPREHENSIVE METABOLIC PANEL
ALT: 27 U/L (ref 0–35)
AST: 39 U/L — ABNORMAL HIGH (ref 0–37)
Albumin: 4.2 g/dL (ref 3.5–5.2)
Alkaline Phosphatase: 125 U/L — ABNORMAL HIGH (ref 39–117)
BUN: 7 mg/dL (ref 6–23)
CO2: 30 mEq/L (ref 19–32)
Calcium: 9.6 mg/dL (ref 8.4–10.5)
Chloride: 98 mEq/L (ref 96–112)
Creatinine, Ser: 0.7 mg/dL (ref 0.40–1.20)
GFR: 94.47 mL/min (ref >60.00)
Glucose, Bld: 92 mg/dL (ref 70–99)
Potassium: 3.8 mEq/L (ref 3.5–5.1)
Sodium: 136 mEq/L (ref 135–145)
Total Bilirubin: 1 mg/dL (ref 0.2–1.2)
Total Protein: 7.8 g/dL (ref 6.0–8.3)

## 2021-03-06 LAB — HEMOGLOBIN A1C: Hgb A1c MFr Bld: 5.9 % (ref 4.6–6.5)

## 2021-03-06 LAB — TSH: TSH: 0.58 u[IU]/mL (ref 0.35–5.50)

## 2021-03-06 MED ORDER — ALBUTEROL SULFATE HFA 108 (90 BASE) MCG/ACT IN AERS: 2.0000 | INHALATION_SPRAY | RESPIRATORY_TRACT | 5 refills | Status: DC | PRN

## 2021-03-06 MED ORDER — METHYLPREDNISOLONE 4 MG PO TBPK: ORAL_TABLET | ORAL | 0 refills | Status: DC

## 2021-03-06 MED ORDER — AZITHROMYCIN 250 MG PO TABS: ORAL_TABLET | ORAL | 0 refills | Status: DC

## 2021-03-06 MED ORDER — PROMETHAZINE-CODEINE 6.25-10 MG/5ML PO SYRP: 5.0000 mL | ORAL_SOLUTION | ORAL | 0 refills | Status: DC | PRN

## 2021-03-06 NOTE — Assessment & Plan Note (Addendum)
New Start Z pack Medrol pack Prom-cod syr prn Tessalon syr Proventil qid MDI

## 2021-03-06 NOTE — Progress Notes (Signed)
Subjective:  Patient ID: Heidi Good, female    DOB: 09-16-1960  Age: 60 y.o. MRN: [id number]  CC: Follow-up (6 MONTH F/U) and Cough (C/o dry cough x's 2 weeks)   HPI Heidi Good presents for dry cough since Oct 6. Cough meds did not help Ran out of HHN, MDI F/u allergies  Outpatient Medications Prior to Visit  Medication Sig Dispense Refill   ALPRAZolam (XANAX) 1 MG tablet Take 0.5-1 tablets (0.5-1 mg total) by mouth daily as needed for anxiety. 30 tablet 2   AMBULATORY NON FORMULARY MEDICATION Medication Name: Nitroglycerin gel 0.125 % apply pea size amount to rectum twice daily x 8 weeks 30 g 0   ARIPiprazole (ABILIFY) 15 MG tablet Take 1 tablet (15 mg total) by mouth daily. 90 tablet 1   diclofenac (VOLTAREN) 75 MG EC tablet TAKE 1 TABLET(75 MG) BY MOUTH TWICE DAILY 50 tablet 2   diclofenac sodium (VOLTAREN) 1 % GEL Apply topically at bedtime.     estradiol (ESTRACE) 0.5 MG tablet Take 1 tablet (0.5 mg total) by mouth daily. 30 tablet 1   fluticasone (FLOVENT HFA) 110 MCG/ACT inhaler Inhale 2 puffs into the lungs 2 (two) times daily. 1 Inhaler 12   furosemide (LASIX) 20 MG tablet Take 1-2 tablets (20-40 mg total) by mouth daily as needed for edema. 30 tablet 3   levothyroxine (SYNTHROID) 75 MCG tablet TAKE 1 TABLET(75 MCG) BY MOUTH DAILY 90 tablet 3   loratadine (CLARITIN) 10 MG tablet Take 1 tablet (10 mg total) by mouth daily. 90 tablet 3   meloxicam (MOBIC) 15 MG tablet Take 1 tablet (15 mg total) by mouth daily as needed for pain. 30 tablet 2   Olopatadine HCl 0.2 % SOLN Apply to eye.     Plecanatide (TRULANCE) 3 MG TABS Take 3 mg by mouth daily. 30 tablet 3   potassium chloride (KLOR-CON) 8 MEQ tablet TAKE 1 TABLET(8 MEQ) BY MOUTH DAILY 90 tablet 3   albuterol (PROVENTIL HFA;VENTOLIN HFA) 108 (90 Base) MCG/ACT inhaler Inhale 1-2 puffs into the lungs every 6 (six) hours as needed for wheezing or shortness of breath. 1 Inhaler 5   albuterol (PROVENTIL) (2.5 MG/3ML) 0.083%  nebulizer solution Take 3 mLs (2.5 mg total) by nebulization every 6 (six) hours as needed for wheezing or shortness of breath. 75 mL 5   Fluticasone Propionate, Inhal, (FLOVENT DISKUS) 100 MCG/BLIST AEPB Inhale 1 Inhaler into the lungs 2 (two) times a day. 3 each 3   No facility-administered medications prior to visit.    ROS: Review of Systems  Constitutional:  Negative for activity change, appetite change, chills, fatigue and unexpected weight change.  HENT:  Negative for congestion, mouth sores and sinus pressure.   Eyes:  Negative for visual disturbance.  Respiratory:  Positive for cough and wheezing. Negative for chest tightness and shortness of breath.   Gastrointestinal:  Negative for abdominal pain and nausea.  Genitourinary:  Negative for difficulty urinating, frequency and vaginal pain.  Musculoskeletal:  Negative for back pain and gait problem.  Skin:  Negative for pallor and rash.  Neurological:  Negative for dizziness, tremors, weakness, numbness and headaches.  Psychiatric/Behavioral:  Negative for confusion and sleep disturbance.    Objective:  BP (!) 151/82 (BP Location: Left Arm)   Pulse 60   Temp 99.1 F (37.3 C) (Oral)   Ht 5\' 5"  (1.651 m)   Wt 190 lb 12.8 oz (86.5 kg)   SpO2 97%   BMI 31.75  kg/m   BP Readings from Last 3 Encounters:  03/06/21 (!) 151/82  12/18/20 138/64  09/05/20 (!) 152/70    Wt Readings from Last 3 Encounters:  03/06/21 190 lb 12.8 oz (86.5 kg)  12/18/20 192 lb 12.8 oz (87.5 kg)  09/05/20 192 lb 9.6 oz (87.4 kg)    Physical Exam Constitutional:      General: She is not in acute distress.    Appearance: She is well-developed. She is obese.  HENT:     Head: Normocephalic.     Right Ear: External ear normal.     Left Ear: External ear normal.     Nose: Nose normal.  Eyes:     General:        Right eye: No discharge.        Left eye: No discharge.     Conjunctiva/sclera: Conjunctivae normal.     Pupils: Pupils are equal,  round, and reactive to light.  Neck:     Thyroid: No thyromegaly.     Vascular: No JVD.     Trachea: No tracheal deviation.  Cardiovascular:     Rate and Rhythm: Normal rate and regular rhythm.     Heart sounds: Normal heart sounds.  Pulmonary:     Effort: No respiratory distress.     Breath sounds: No stridor. No wheezing.  Abdominal:     General: Bowel sounds are normal. There is no distension.     Palpations: Abdomen is soft. There is no mass.     Tenderness: There is no abdominal tenderness. There is no guarding or rebound.  Musculoskeletal:        General: No tenderness.     Cervical back: Normal range of motion and neck supple. No rigidity.  Lymphadenopathy:     Cervical: No cervical adenopathy.  Skin:    Findings: No erythema or rash.  Neurological:     Mental Status: She is oriented to person, place, and time.     Cranial Nerves: No cranial nerve deficit.     Motor: No abnormal muscle tone.     Coordination: Coordination normal.     Deep Tendon Reflexes: Reflexes normal.  Psychiatric:        Behavior: Behavior normal.        Thought Content: Thought content normal.        Judgment: Judgment normal.    Lab Results  Component Value Date   WBC 10.7 (H) 03/07/2020   HGB 15.6 (H) 03/07/2020   HCT 45.8 03/07/2020   PLT 184.0 03/07/2020   GLUCOSE 80 09/05/2020   CHOL 185 03/07/2020   TRIG 241.0 (H) 03/07/2020   HDL 40.90 03/07/2020   LDLDIRECT 52.0 03/07/2020   LDLCALC 55 03/10/2015   ALT 26 09/05/2020   AST 34 09/05/2020   NA 137 09/05/2020   K 3.3 (L) 09/05/2020   CL 98 09/05/2020   CREATININE 0.68 09/05/2020   BUN 7 09/05/2020   CO2 29 09/05/2020   TSH 1.25 09/05/2020   HGBA1C 6.0 09/05/2020    MM 3D SCREEN BREAST BILATERAL  Result Date: 09/26/2020 CLINICAL DATA:  Screening. EXAM: DIGITAL SCREENING BILATERAL MAMMOGRAM WITH TOMOSYNTHESIS AND CAD TECHNIQUE: Bilateral screening digital craniocaudal and mediolateral oblique mammograms were obtained.  Bilateral screening digital breast tomosynthesis was performed. The images were evaluated with computer-aided detection. COMPARISON:  Previous exam(s). ACR Breast Density Category c: The breast tissue is heterogeneously dense, which may obscure small masses. FINDINGS: There are no findings suspicious for malignancy. The images  were evaluated with computer-aided detection. IMPRESSION: No mammographic evidence of malignancy. A result letter of this screening mammogram will be mailed directly to the patient. RECOMMENDATION: Screening mammogram in one year. (Code:SM-B-01Y) BI-RADS CATEGORY  1: Negative. Electronically Signed   By: Valentino Saxon MD   On: 09/26/2020 13:28    Assessment & Plan:   Problem List Items Addressed This Visit     Allergic rhinitis    Re-start Claritin      Asthma, mild intermittent    Medrol pack Proventil MDI      Relevant Medications   methylPREDNISolone (MEDROL DOSEPAK) 4 MG TBPK tablet   albuterol (VENTOLIN HFA) 108 (90 Base) MCG/ACT inhaler   Asthmatic bronchitis with exacerbation - Primary    New Start Z pack Medrol pack Prom-cod syr prn Tessalon syr Proventil qid MDI      Relevant Medications   methylPREDNISolone (MEDROL DOSEPAK) 4 MG TBPK tablet   albuterol (VENTOLIN HFA) 108 (90 Base) MCG/ACT inhaler   Elevated BP without diagnosis of hypertension   Hypothyroidism    Cont w/Levothroid TSH      Relevant Orders   Comprehensive metabolic panel   TSH   Prediabetes    Check A1c      Relevant Orders   Comprehensive metabolic panel   TSH   Hemoglobin A1c      Meds ordered this encounter  Medications   methylPREDNISolone (MEDROL DOSEPAK) 4 MG TBPK tablet    Sig: As directed    Dispense:  21 tablet    Refill:  0   azithromycin (ZITHROMAX Z-PAK) 250 MG tablet    Sig: As directed    Dispense:  6 tablet    Refill:  0   promethazine-codeine (PHENERGAN WITH CODEINE) 6.25-10 MG/5ML syrup    Sig: Take 5 mLs by mouth every 4 (four) hours  as needed for cough.    Dispense:  300 mL    Refill:  0   albuterol (VENTOLIN HFA) 108 (90 Base) MCG/ACT inhaler    Sig: Inhale 2 puffs into the lungs every 4 (four) hours as needed for wheezing or shortness of breath.    Dispense:  1 each    Refill:  5      Follow-up: No follow-ups on file.  Walker Kehr, MD

## 2021-03-06 NOTE — Addendum Note (Signed)
Addended by: Jacobo Forest on: 03/06/2021 01:58 PM   Modules accepted: Orders

## 2021-03-06 NOTE — Assessment & Plan Note (Signed)
Cont w/Levothroid TSH

## 2021-03-06 NOTE — Assessment & Plan Note (Signed)
Check A1c. 

## 2021-03-06 NOTE — Assessment & Plan Note (Signed)
Medrol pack Proventil MDI

## 2021-03-06 NOTE — Assessment & Plan Note (Signed)
Re-start Claritin

## 2021-03-13 ENCOUNTER — Telehealth: Payer: Self-pay | Admitting: Internal Medicine

## 2021-03-13 NOTE — Telephone Encounter (Signed)
Patient requesting a call back to discuss lab results from 03-06-2021

## 2021-04-18 ENCOUNTER — Other Ambulatory Visit: Payer: Self-pay

## 2021-04-18 ENCOUNTER — Ambulatory Visit (INDEPENDENT_AMBULATORY_CARE_PROVIDER_SITE_OTHER): Payer: Medicare Other | Admitting: Internal Medicine

## 2021-04-18 ENCOUNTER — Encounter: Payer: Self-pay | Admitting: Internal Medicine

## 2021-04-18 VITALS — BP 142/70 | HR 56 | Temp 98.3°F | Ht 65.0 in | Wt 191.0 lb

## 2021-04-18 DIAGNOSIS — Z23 Encounter for immunization: Secondary | ICD-10-CM | POA: Diagnosis not present

## 2021-04-18 DIAGNOSIS — E034 Atrophy of thyroid (acquired): Secondary | ICD-10-CM

## 2021-04-18 DIAGNOSIS — R739 Hyperglycemia, unspecified: Secondary | ICD-10-CM | POA: Diagnosis not present

## 2021-04-18 DIAGNOSIS — F4321 Adjustment disorder with depressed mood: Secondary | ICD-10-CM | POA: Diagnosis not present

## 2021-04-18 DIAGNOSIS — J3089 Other allergic rhinitis: Secondary | ICD-10-CM

## 2021-04-18 DIAGNOSIS — J4520 Mild intermittent asthma, uncomplicated: Secondary | ICD-10-CM

## 2021-04-18 DIAGNOSIS — R7303 Prediabetes: Secondary | ICD-10-CM | POA: Diagnosis not present

## 2021-04-18 NOTE — Assessment & Plan Note (Signed)
Check A1c. 

## 2021-04-18 NOTE — Assessment & Plan Note (Signed)
Daily Claritin po

## 2021-04-18 NOTE — Progress Notes (Signed)
Subjective:  Patient ID: Heidi Good, female    DOB: March 01, 1961  Age: 60 y.o. MRN: [id number]  CC: Follow-up (4 week f/u)   HPI NAKAYA MISHKIN presents for HTN, asthma, anxiety f/u  Outpatient Medications Prior to Visit  Medication Sig Dispense Refill   albuterol (VENTOLIN HFA) 108 (90 Base) MCG/ACT inhaler Inhale 2 puffs into the lungs every 4 (four) hours as needed for wheezing or shortness of breath. 1 each 5   ALPRAZolam (XANAX) 1 MG tablet Take 0.5-1 tablets (0.5-1 mg total) by mouth daily as needed for anxiety. 30 tablet 2   AMBULATORY NON FORMULARY MEDICATION Medication Name: Nitroglycerin gel 0.125 % apply pea size amount to rectum twice daily x 8 weeks 30 g 0   ARIPiprazole (ABILIFY) 15 MG tablet Take 1 tablet (15 mg total) by mouth daily. 90 tablet 1   diclofenac (VOLTAREN) 75 MG EC tablet TAKE 1 TABLET(75 MG) BY MOUTH TWICE DAILY 50 tablet 2   diclofenac sodium (VOLTAREN) 1 % GEL Apply topically at bedtime.     estradiol (ESTRACE) 0.5 MG tablet Take 1 tablet (0.5 mg total) by mouth daily. 30 tablet 1   fluticasone (FLOVENT HFA) 110 MCG/ACT inhaler Inhale 2 puffs into the lungs 2 (two) times daily. 1 Inhaler 12   furosemide (LASIX) 20 MG tablet Take 1-2 tablets (20-40 mg total) by mouth daily as needed for edema. 30 tablet 3   levothyroxine (SYNTHROID) 75 MCG tablet TAKE 1 TABLET(75 MCG) BY MOUTH DAILY 90 tablet 3   loratadine (CLARITIN) 10 MG tablet Take 1 tablet (10 mg total) by mouth daily. 90 tablet 3   meloxicam (MOBIC) 15 MG tablet Take 1 tablet (15 mg total) by mouth daily as needed for pain. 30 tablet 2   Olopatadine HCl 0.2 % SOLN Apply to eye.     Plecanatide (TRULANCE) 3 MG TABS Take 3 mg by mouth daily. 30 tablet 3   potassium chloride (KLOR-CON) 8 MEQ tablet TAKE 1 TABLET(8 MEQ) BY MOUTH DAILY 90 tablet 3   azithromycin (ZITHROMAX Z-PAK) 250 MG tablet As directed (Patient not taking: Reported on 04/18/2021) 6 tablet 0   methylPREDNISolone (MEDROL DOSEPAK) 4 MG  TBPK tablet As directed (Patient not taking: Reported on 04/18/2021) 21 tablet 0   promethazine-codeine (PHENERGAN WITH CODEINE) 6.25-10 MG/5ML syrup Take 5 mLs by mouth every 4 (four) hours as needed for cough. (Patient not taking: Reported on 04/18/2021) 300 mL 0   No facility-administered medications prior to visit.    ROS: Review of Systems  Constitutional:  Negative for activity change, appetite change, chills, fatigue and unexpected weight change.  HENT:  Negative for congestion, mouth sores and sinus pressure.   Eyes:  Negative for visual disturbance.  Respiratory:  Negative for cough and chest tightness.   Gastrointestinal:  Negative for abdominal pain and nausea.  Genitourinary:  Negative for difficulty urinating, frequency and vaginal pain.  Musculoskeletal:  Negative for back pain and gait problem.  Skin:  Negative for pallor and rash.  Neurological:  Negative for dizziness, tremors, weakness, numbness and headaches.  Psychiatric/Behavioral:  Negative for confusion, sleep disturbance and suicidal ideas. The patient is nervous/anxious.    Objective:  BP (!) 142/70 (BP Location: Left Arm)   Pulse (!) 56   Temp 98.3 F (36.8 C) (Oral)   Ht 5\' 5"  (1.651 m)   Wt 191 lb (86.6 kg)   SpO2 97%   BMI 31.78 kg/m   BP Readings from Last 3 Encounters:  04/18/21 (!) 142/70  03/06/21 (!) 151/82  12/18/20 138/64    Wt Readings from Last 3 Encounters:  04/18/21 191 lb (86.6 kg)  03/06/21 190 lb 12.8 oz (86.5 kg)  12/18/20 192 lb 12.8 oz (87.5 kg)    Physical Exam Constitutional:      General: She is not in acute distress.    Appearance: She is well-developed.  HENT:     Head: Normocephalic.     Right Ear: External ear normal.     Left Ear: External ear normal.     Nose: Nose normal.  Eyes:     General:        Right eye: No discharge.        Left eye: No discharge.     Conjunctiva/sclera: Conjunctivae normal.     Pupils: Pupils are equal, round, and reactive to light.   Neck:     Thyroid: No thyromegaly.     Vascular: No JVD.     Trachea: No tracheal deviation.  Cardiovascular:     Rate and Rhythm: Normal rate and regular rhythm.     Heart sounds: Normal heart sounds.  Pulmonary:     Effort: No respiratory distress.     Breath sounds: No stridor. No wheezing.  Abdominal:     General: Bowel sounds are normal. There is no distension.     Palpations: Abdomen is soft. There is no mass.     Tenderness: There is no abdominal tenderness. There is no guarding or rebound.  Musculoskeletal:        General: No tenderness.     Cervical back: Normal range of motion and neck supple. No rigidity.  Lymphadenopathy:     Cervical: No cervical adenopathy.  Skin:    Findings: No erythema or rash.  Neurological:     Mental Status: She is oriented to person, place, and time.     Cranial Nerves: Cranial nerve deficit present.     Sensory: Sensory deficit present.     Motor: No abnormal muscle tone.     Coordination: Coordination normal.     Deep Tendon Reflexes: Reflexes normal.  Psychiatric:        Behavior: Behavior normal.        Thought Content: Thought content normal.        Judgment: Judgment normal.    Lab Results  Component Value Date   WBC 10.7 (H) 03/07/2020   HGB 15.6 (H) 03/07/2020   HCT 45.8 03/07/2020   PLT 184.0 03/07/2020   GLUCOSE 92 03/06/2021   CHOL 185 03/07/2020   TRIG 241.0 (H) 03/07/2020   HDL 40.90 03/07/2020   LDLDIRECT 52.0 03/07/2020   LDLCALC 55 03/10/2015   ALT 27 03/06/2021   AST 39 (H) 03/06/2021   NA 136 03/06/2021   K 3.8 03/06/2021   CL 98 03/06/2021   CREATININE 0.70 03/06/2021   BUN 7 03/06/2021   CO2 30 03/06/2021   TSH 0.58 03/06/2021   HGBA1C 5.9 03/06/2021    MM 3D SCREEN BREAST BILATERAL  Result Date: 09/26/2020 CLINICAL DATA:  Screening. EXAM: DIGITAL SCREENING BILATERAL MAMMOGRAM WITH TOMOSYNTHESIS AND CAD TECHNIQUE: Bilateral screening digital craniocaudal and mediolateral oblique mammograms were  obtained. Bilateral screening digital breast tomosynthesis was performed. The images were evaluated with computer-aided detection. COMPARISON:  Previous exam(s). ACR Breast Density Category c: The breast tissue is heterogeneously dense, which may obscure small masses. FINDINGS: There are no findings suspicious for malignancy. The images were evaluated with computer-aided detection. IMPRESSION: No  mammographic evidence of malignancy. A result letter of this screening mammogram will be mailed directly to the patient. RECOMMENDATION: Screening mammogram in one year. (Code:SM-B-01Y) BI-RADS CATEGORY  1: Negative. Electronically Signed   By: Valentino Saxon MD   On: 09/26/2020 13:28    Assessment & Plan:   Problem List Items Addressed This Visit     Allergic rhinitis    Daily Claritin po      Relevant Orders   TSH   Grief   Hyperglycemia    Check A1c      Relevant Orders   Hemoglobin A1c   Hypothyroidism - Primary    On Levothroid      Relevant Orders   TSH   Lipid panel   Prediabetes    Check A1c      Relevant Orders   Comprehensive metabolic panel   Hemoglobin A1c   TSH   Lipid panel      No orders of the defined types were placed in this encounter.     Follow-up: No follow-ups on file.  Walker Kehr, MD

## 2021-04-18 NOTE — Assessment & Plan Note (Addendum)
Better  Use Proventil Treat allergies Prevnar 20 offered, given

## 2021-04-18 NOTE — Assessment & Plan Note (Signed)
On Levothroid 

## 2021-04-18 NOTE — Addendum Note (Signed)
Addended by: Earnstine Regal on: 04/18/2021 02:32 PM   Modules accepted: Orders

## 2021-07-26 ENCOUNTER — Ambulatory Visit (INDEPENDENT_AMBULATORY_CARE_PROVIDER_SITE_OTHER): Payer: Medicare Other | Admitting: Psychiatry

## 2021-07-26 ENCOUNTER — Other Ambulatory Visit: Payer: Self-pay

## 2021-07-26 ENCOUNTER — Encounter: Payer: Self-pay | Admitting: Psychiatry

## 2021-07-26 DIAGNOSIS — F99 Mental disorder, not otherwise specified: Secondary | ICD-10-CM | POA: Diagnosis not present

## 2021-07-26 DIAGNOSIS — F200 Paranoid schizophrenia: Secondary | ICD-10-CM

## 2021-07-26 DIAGNOSIS — F5105 Insomnia due to other mental disorder: Secondary | ICD-10-CM | POA: Diagnosis not present

## 2021-07-26 MED ORDER — ARIPIPRAZOLE 15 MG PO TABS: 15.0000 mg | ORAL_TABLET | Freq: Every day | ORAL | 1 refills | Status: DC

## 2021-07-26 NOTE — Progress Notes (Signed)
Heidi Good ?[id number] ?23-Dec-1960 ?61 y.o. ? ?Subjective:  ? ?Patient ID:  Heidi Good is a 61 y.o. (DOB [birth year]) female. ? ?Chief Complaint:  ?Chief Complaint  ?Patient presents with  ? Follow-up  ?  Paranoid schizophrenia ([place])  ? ? ? ?Depression ?       Associated symptoms include no decreased concentration and no suicidal ideas. patient is deaf and seen with an interpreter.  She has notes. ?Heidi Good [facility] presents to the office today for follow-up of schizophrenia. ? ?seen November, 2020.  She continues on Abilify 15 mg daily and Xanax is the only psychiatric meds and no meds were changed. ? ?Patient seen today the with the assistance of a translator due to her deafness. ? ?Stress M declining health and she feels more nervous and worried.  Helps mother.  Takes alprazolam occ with little benefit.  Sleep is affected by care of mother.   ? ? M gets confused and dx dementia and sleeping a lot. B comes and helps.  She pays the bills and not the mother.    No longer has funny feelings and doesn't need the Xanax. ? ?Denies feeling afraid at home.  B will move in when M passes away.  Has several dogs.  [place] Shepherds make her feel safer.  Abilify 15 mg increase helped her.   ? ? In November bothered bc could see something and feel something walk by outside and dog acted funny.  Then brother came over and checked things.  She felt someone was walking around outside.  Says this happened 7 times by mid DEC.  She feels in danger from this incidents.  Never saw anyone but believed there was someone there.  Police came by and didn't find anything.  Thinks it's an old friend.  M having problems with dementia.  Pt takes care of her.  Pt helps her with meds and cooks for her and helps bathe hers. ? ?Rare Xanax is less helpful. ? ?Patient reports stable mood and denies depressed or irritable moods.  Patient denies any recent difficulty with anxiety except as noted. No fear.  Patient denies difficulty with sleep  initiation or maintenance. Denies appetite disturbance.  Patient reports that energy and motivation have been good.  Patient denies any difficulty with concentration.  Patient denies any suicidal ideation. ? ?15 mg Abilify helped her feel better, calmer than did the 10 mg Abilify.  No SE and claims compliance.  Denies voices, depression, anger on this dosage.  Sleep good 8 hours.  No drowsiness. Watches news. ? ?05/24/20 appt noted: seen with ASL interpreter ?M in Hospice with poor health.  Not depressed but sad.  Not anxious.   ?B is helping some.   Mother's sister Lelon Frohlich is helping.  F died 9 years ago.  Loves Mother.  Will live alone in the house after her mother dies. ?Patient reports stable mood and denies depressed or irritable moods.  Patient denies any recent difficulty with anxiety.  Patient denies difficulty with sleep initiation or maintenance. Denies appetite disturbance.  Patient reports that energy and motivation have been good.  Patient denies any difficulty with concentration.  Patient denies any suicidal ideation. No fear. ?Plan: No med changes ? ?09/20/2020 appointment with the following noted: Seen with interpreter ?Healthy no Covid. ?M died. ?Going through process of grief.  Misses mother.  B helping a lot. ?No fearful or unusual anxiety.  2 dogs. I feel at peace.  No excess crying.  Sleep  is good. ? No problems with Abilify 15 mg daily. Rare Xanax 0.5-1 mg. ?Pt drives without problems. ?Plan: No med changes ? ?01/24/2021 appointment with the following noted: ?Rare Xanax with little anxiety.  Peace after mother died. ?Takes Abilify consistently 15 mg nightly. ?Sleeps well after 8 hours. ?No sig fear nor anxiety.  B checks on her.  Supportive local community.  Has dogs. ?No SE.   ?Painting and doing things around the house.  No problems driving. ?Plan: no med changes ? ?07/26/21 appt noted: seen with interpretor ?Fine.  Relaxed.  No problems with meds. ?Dog 65 yo with cancer. Put to sleep.   ?Consistent  with meds Abilify 15 mg.  ?Has 4 dogs now.  Takes care of them herself. ?Stopped Xanax bc not needed. ?Good sleep and energy. ?B helps her.  His wife takes her out.  B 61 is [employer] cabin crew.   ? ?Other psychiatric medications tried include perphenazine which she did not like, loxapine which she did not like, and Saphris.  She complained that it gave her strange feeling under the tongue.  She has taken other psychiatric medications in the past but she does not know the names of them and we have not been able to get the records. ?Abilify 15 benefit. ? ?Review of Systems:  ?Review of Systems  ?HENT:    ?     Deaf  ?Cardiovascular:  Negative for chest pain and palpitations.  ?Musculoskeletal:  Positive for arthralgias.  ?Neurological:  Negative for tremors and weakness.  ?Psychiatric/Behavioral:  Positive for depression. Negative for agitation, behavioral problems, confusion, decreased concentration, dysphoric mood, hallucinations, self-injury, sleep disturbance and suicidal ideas. The patient is not nervous/anxious and is not hyperactive.   ? ?Medications: I have reviewed the patient's current medications. ? ?Current Outpatient Medications  ?Medication Sig Dispense Refill  ? albuterol (VENTOLIN HFA) 108 (90 Base) MCG/ACT inhaler Inhale 2 puffs into the lungs every 4 (four) hours as needed for wheezing or shortness of breath. 1 each 5  ? AMBULATORY NON FORMULARY MEDICATION Medication Name: Nitroglycerin gel 0.125 % apply pea size amount to rectum twice daily x 8 weeks 30 g 0  ? fluticasone (FLOVENT HFA) 110 MCG/ACT inhaler Inhale 2 puffs into the lungs 2 (two) times daily. 1 Inhaler 12  ? furosemide (LASIX) 20 MG tablet Take 1-2 tablets (20-40 mg total) by mouth daily as needed for edema. 30 tablet 3  ? levothyroxine (SYNTHROID) 75 MCG tablet TAKE 1 TABLET(75 MCG) BY MOUTH DAILY 90 tablet 3  ? loratadine (CLARITIN) 10 MG tablet Take 1 tablet (10 mg total) by mouth daily. 90 tablet 3  ? meloxicam (MOBIC) 15 MG tablet  Take 1 tablet (15 mg total) by mouth daily as needed for pain. 30 tablet 2  ? Olopatadine HCl 0.2 % SOLN Apply to eye.    ? Plecanatide (TRULANCE) 3 MG TABS Take 3 mg by mouth daily. 30 tablet 3  ? potassium chloride (KLOR-CON) 8 MEQ tablet TAKE 1 TABLET(8 MEQ) BY MOUTH DAILY 90 tablet 3  ? ALPRAZolam (XANAX) 1 MG tablet Take 0.5-1 tablets (0.5-1 mg total) by mouth daily as needed for anxiety. (Patient not taking: Reported on 07/26/2021) 30 tablet 2  ? ARIPiprazole (ABILIFY) 15 MG tablet Take 1 tablet (15 mg total) by mouth daily. 90 tablet 1  ? diclofenac (VOLTAREN) 75 MG EC tablet TAKE 1 TABLET(75 MG) BY MOUTH TWICE DAILY (Patient not taking: Reported on 07/26/2021) 50 tablet 2  ? diclofenac sodium (VOLTAREN) 1 %  GEL Apply topically at bedtime. (Patient not taking: Reported on 07/26/2021)    ? estradiol (ESTRACE) 0.5 MG tablet Take 1 tablet (0.5 mg total) by mouth daily. (Patient not taking: Reported on 07/26/2021) 30 tablet 1  ? ?No current facility-administered medications for this visit.  ? ? ?Medication Side Effects: None ? ?Allergies:  ?Allergies  ?Allergen Reactions  ? Doxycycline Nausea And Vomiting  ? Hydrocodone   ?  Nausea from cough syrup - ?? Very remote  ? Other   ?  Dog, cat, nuts, grass, trees  ? ? ?Past Medical History:  ?Diagnosis Date  ? Anxiety   ? Constipation   ? Deafness   ? Depression   ? Diabetes mellitus ([place])   ? Gastroparesis   ? Hyperlipemia   ? Hypothyroidism   ? ? ?Family History  ?Problem Relation Age of Onset  ? Diabetes Mother   ? Colon polyps Mother   ?     over 150 polyps detected  ? Heart disease Father   ? Multiple myeloma Father   ? Emphysema Father   ? Heart attack Father   ? Liver cancer Maternal Grandmother   ?     not primary source, spread to her liver  ? Heart attack Maternal Grandfather   ? Emphysema Paternal Grandfather   ? Colon cancer Neg Hx   ? Esophageal cancer Neg Hx   ? Rectal cancer Neg Hx   ? Stomach cancer Neg Hx   ? ? ?Social History  ? ?Socioeconomic History  ?  Marital status: Divorced  ?  Spouse name: Not on file  ? Number of children: 0  ? Years of education: Not on file  ? Highest education level: Not on file  ?Occupational History  ? Occupation: disablied  ?  Empl

## 2021-08-20 ENCOUNTER — Encounter: Payer: Self-pay | Admitting: Internal Medicine

## 2021-08-20 ENCOUNTER — Ambulatory Visit (INDEPENDENT_AMBULATORY_CARE_PROVIDER_SITE_OTHER): Payer: Medicare Other | Admitting: Internal Medicine

## 2021-08-20 ENCOUNTER — Ambulatory Visit (INDEPENDENT_AMBULATORY_CARE_PROVIDER_SITE_OTHER): Payer: Medicare Other

## 2021-08-20 DIAGNOSIS — F321 Major depressive disorder, single episode, moderate: Secondary | ICD-10-CM | POA: Diagnosis not present

## 2021-08-20 DIAGNOSIS — M79644 Pain in right finger(s): Secondary | ICD-10-CM

## 2021-08-20 DIAGNOSIS — F4321 Adjustment disorder with depressed mood: Secondary | ICD-10-CM

## 2021-08-20 NOTE — Progress Notes (Signed)
? ?Subjective:  ?Patient ID: Heidi Good, female    DOB: 02/12/1961  Age: 61 y.o. MRN: [id number] ? ?CC: No chief complaint on file. ? ? ?HPI ?Keyle Doby [facility] presents for R index finger x 2-4 wks  ?She is here with a sign language interpreter ?Her mom died a year ago ? ?Outpatient Medications Prior to Visit  ?Medication Sig Dispense Refill  ? albuterol (VENTOLIN HFA) 108 (90 Base) MCG/ACT inhaler Inhale 2 puffs into the lungs every 4 (four) hours as needed for wheezing or shortness of breath. 1 each 5  ? AMBULATORY NON FORMULARY MEDICATION Medication Name: Nitroglycerin gel 0.125 % apply pea size amount to rectum twice daily x 8 weeks 30 g 0  ? ARIPiprazole (ABILIFY) 15 MG tablet Take 1 tablet (15 mg total) by mouth daily. 90 tablet 1  ? fluticasone (FLOVENT HFA) 110 MCG/ACT inhaler Inhale 2 puffs into the lungs 2 (two) times daily. 1 Inhaler 12  ? furosemide (LASIX) 20 MG tablet Take 1-2 tablets (20-40 mg total) by mouth daily as needed for edema. 30 tablet 3  ? loratadine (CLARITIN) 10 MG tablet Take 1 tablet (10 mg total) by mouth daily. 90 tablet 3  ? meloxicam (MOBIC) 15 MG tablet Take 1 tablet (15 mg total) by mouth daily as needed for pain. 30 tablet 2  ? Olopatadine HCl 0.2 % SOLN Apply to eye.    ? Plecanatide (TRULANCE) 3 MG TABS Take 3 mg by mouth daily. 30 tablet 3  ? levothyroxine (SYNTHROID) 75 MCG tablet TAKE 1 TABLET(75 MCG) BY MOUTH DAILY 90 tablet 3  ? potassium chloride (KLOR-CON) 8 MEQ tablet TAKE 1 TABLET(8 MEQ) BY MOUTH DAILY 90 tablet 3  ? ALPRAZolam (XANAX) 1 MG tablet Take 0.5-1 tablets (0.5-1 mg total) by mouth daily as needed for anxiety. (Patient not taking: Reported on 07/26/2021) 30 tablet 2  ? estradiol (ESTRACE) 0.5 MG tablet Take 1 tablet (0.5 mg total) by mouth daily. (Patient not taking: Reported on 07/26/2021) 30 tablet 1  ? diclofenac (VOLTAREN) 75 MG EC tablet TAKE 1 TABLET(75 MG) BY MOUTH TWICE DAILY (Patient not taking: Reported on 07/26/2021) 50 tablet 2  ? diclofenac sodium  (VOLTAREN) 1 % GEL Apply topically at bedtime. (Patient not taking: Reported on 07/26/2021)    ? ?No facility-administered medications prior to visit.  ? ? ?ROS: ?Review of Systems  ?Constitutional:  Positive for fatigue. Negative for activity change, appetite change, chills and unexpected weight change.  ?HENT:  Negative for congestion, mouth sores and sinus pressure.   ?Eyes:  Negative for visual disturbance.  ?Respiratory:  Negative for cough and chest tightness.   ?Gastrointestinal:  Negative for abdominal pain and nausea.  ?Genitourinary:  Negative for difficulty urinating, frequency and vaginal pain.  ?Musculoskeletal:  Positive for arthralgias. Negative for back pain and gait problem.  ?Skin:  Negative for pallor and rash.  ?Neurological:  Negative for dizziness, tremors, weakness, numbness and headaches.  ?Psychiatric/Behavioral:  Negative for confusion and sleep disturbance.   ? ?Objective:  ?BP 130/90 (BP Location: Left Arm, Patient Position: Sitting, Cuff Size: Large)   Pulse 65   Temp 98.3 ?F (36.8 ?C) (Oral)   Ht '5\' 5"'$  (1.651 m)   Wt 193 lb (87.5 kg)   SpO2 98%   BMI 32.12 kg/m?  ? ?BP Readings from Last 3 Encounters:  ?08/20/21 130/90  ?04/18/21 (!) 142/70  ?03/06/21 (!) 151/82  ? ? ?Wt Readings from Last 3 Encounters:  ?08/20/21 193 lb (87.5 kg)  ?  04/18/21 191 lb (86.6 kg)  ?03/06/21 190 lb 12.8 oz (86.5 kg)  ? ? ?Physical Exam ?Constitutional:   ?   General: She is not in acute distress. ?   Appearance: Normal appearance. She is well-developed. She is obese.  ?HENT:  ?   Head: Normocephalic.  ?   Right Ear: External ear normal.  ?   Left Ear: External ear normal.  ?   Nose: Nose normal.  ?Eyes:  ?   General:     ?   Right eye: No discharge.     ?   Left eye: No discharge.  ?   Conjunctiva/sclera: Conjunctivae normal.  ?   Pupils: Pupils are equal, round, and reactive to light.  ?Neck:  ?   Thyroid: No thyromegaly.  ?   Vascular: No JVD.  ?   Trachea: No tracheal deviation.  ?Cardiovascular:  ?    Rate and Rhythm: Normal rate and regular rhythm.  ?   Heart sounds: Normal heart sounds.  ?Pulmonary:  ?   Effort: No respiratory distress.  ?   Breath sounds: No stridor. No wheezing.  ?Abdominal:  ?   General: Bowel sounds are normal. There is no distension.  ?   Palpations: Abdomen is soft. There is no mass.  ?   Tenderness: There is no abdominal tenderness. There is no guarding or rebound.  ?Musculoskeletal:     ?   General: Tenderness present.  ?   Cervical back: Normal range of motion and neck supple. No rigidity.  ?Lymphadenopathy:  ?   Cervical: No cervical adenopathy.  ?Skin: ?   Findings: No erythema or rash.  ?Neurological:  ?   Mental Status: She is oriented to person, place, and time.  ?   Cranial Nerves: No cranial nerve deficit.  ?   Motor: No abnormal muscle tone.  ?   Coordination: Coordination normal.  ?   Deep Tendon Reflexes: Reflexes normal.  ?Psychiatric:     ?   Behavior: Behavior normal.     ?   Thought Content: Thought content normal.     ?   Judgment: Judgment normal.  ?Right index finger is painful with range of motion and palpation ? ? A total time of 30 minutes was spent preparing to see the patient, reviewing tests, other medical records.  Also, obtaining history and performing comprehensive physical exam.  Additionally, counseling the patient regarding the arthritis via interpreter.   Finally, documenting clinical information in the health records, coordination of care, educating the patient.  ? ? ?Lab Results  ?Component Value Date  ? WBC 10.7 (H) 03/07/2020  ? HGB 15.6 (H) 03/07/2020  ? HCT 45.8 03/07/2020  ? PLT 184.0 03/07/2020  ? GLUCOSE 92 03/06/2021  ? CHOL 185 03/07/2020  ? TRIG 241.0 (H) 03/07/2020  ? HDL 40.90 03/07/2020  ? LDLDIRECT 52.0 03/07/2020  ? [place] 55 03/10/2015  ? ALT 27 03/06/2021  ? AST 39 (H) 03/06/2021  ? NA 136 03/06/2021  ? K 3.8 03/06/2021  ? CL 98 03/06/2021  ? CREATININE 0.70 03/06/2021  ? BUN 7 03/06/2021  ? CO2 30 03/06/2021  ? TSH 0.58 03/06/2021  ?  HGBA1C 5.9 03/06/2021  ? ? ?MM 3D SCREEN BREAST BILATERAL ? ?Result Date: 09/26/2020 ?CLINICAL DATA:  Screening. EXAM: DIGITAL SCREENING BILATERAL MAMMOGRAM WITH TOMOSYNTHESIS AND CAD TECHNIQUE: Bilateral screening digital craniocaudal and mediolateral oblique mammograms were obtained. Bilateral screening digital breast tomosynthesis was performed. The images were evaluated with computer-aided detection. COMPARISON:  Previous exam(s). ACR Breast Density Category c: The breast tissue is heterogeneously dense, which may obscure small masses. FINDINGS: There are no findings suspicious for malignancy. The images were evaluated with computer-aided detection. IMPRESSION: No mammographic evidence of malignancy. A result letter of this screening mammogram will be mailed directly to the patient. RECOMMENDATION: Screening mammogram in one year. (Code:SM-B-01Y) BI-RADS CATEGORY  1: Negative. Electronically Signed   By: Valentino Saxon MD   On: 09/26/2020 13:28  ? ? ?Assessment & Plan:  ? ?Problem List Items Addressed This Visit   ? ? Finger pain, right  ?  R index - - strain/OA - 4 dogs ? ?Blue-Emu cream was recommended to use 2-3 times a day ?Splint ?Meloxicam po x 1-2 wks ? ?  ?  ? Relevant Orders  ? DG Finger Index Right (Completed)  ? Major depressive disorder, single episode, moderate ([place])  ?  Continue on Abilify ? ?  ?  ? Grief  ?  Mom died in 2022/08/17 ?Discussed ?  ?  ?  ? ? ?No orders of the defined types were placed in this encounter. ?  ? ? ?Follow-up: Return for a follow-up visit. ? ?Walker Kehr, MD ?

## 2021-08-20 NOTE — Assessment & Plan Note (Signed)
R index - - strain/OA - 4 dogs ? ?Blue-Emu cream was recommended to use 2-3 times a day ?Splint ?Meloxicam po x 1-2 wks ?

## 2021-08-20 NOTE — Patient Instructions (Signed)
Blue-Emu cream - use 2-3 times a day ? ?Finger splint ? ?

## 2021-08-29 ENCOUNTER — Other Ambulatory Visit: Payer: Self-pay | Admitting: Internal Medicine

## 2021-09-09 NOTE — Assessment & Plan Note (Signed)
Continue on Abilify ?

## 2021-09-09 NOTE — Assessment & Plan Note (Signed)
Mom died in 3/22 ?Discussed ?

## 2021-09-10 ENCOUNTER — Telehealth: Payer: Self-pay | Admitting: Internal Medicine

## 2021-09-10 NOTE — Telephone Encounter (Signed)
Pt checking status  08-20-2021 imagining results ? ?Informed pt of provider's 08-22-2021 result notes ? ?Pt expressed understanding and states her finger still hurts periodically and requesting provider's recommendations on how to treat the pain ? ?Please advise ? ? ?

## 2021-09-13 NOTE — Telephone Encounter (Signed)
Called pt spoke with relay person.Marland Kitchen He gave the patient MD response concerning her finger. Will let him know if no better.Marland KitchenJohny Chess ?

## 2021-09-13 NOTE — Telephone Encounter (Signed)
Continue to splint the finger and use the cream.  We can refer to orthopedic surgery if not better. ?Thanks, ?AP ?

## 2021-09-26 ENCOUNTER — Encounter: Payer: Self-pay | Admitting: Gastroenterology

## 2021-09-26 ENCOUNTER — Ambulatory Visit: Payer: Medicare Other | Admitting: Gastroenterology

## 2021-09-26 ENCOUNTER — Ambulatory Visit (INDEPENDENT_AMBULATORY_CARE_PROVIDER_SITE_OTHER): Payer: Medicare Other | Admitting: Gastroenterology

## 2021-09-26 VITALS — BP 134/80 | HR 45 | Ht 63.5 in | Wt 191.1 lb

## 2021-09-26 DIAGNOSIS — K625 Hemorrhage of anus and rectum: Secondary | ICD-10-CM

## 2021-09-26 DIAGNOSIS — R1084 Generalized abdominal pain: Secondary | ICD-10-CM | POA: Diagnosis not present

## 2021-09-26 DIAGNOSIS — K602 Anal fissure, unspecified: Secondary | ICD-10-CM

## 2021-09-26 DIAGNOSIS — K5909 Other constipation: Secondary | ICD-10-CM

## 2021-09-26 MED ORDER — AMBULATORY NON FORMULARY MEDICATION: 1 refills | Status: AC

## 2021-09-26 NOTE — Progress Notes (Signed)
? ? ?[facility] Progress Note: ? ?History: ?Heidi Good ?09/26/2021 ? ?Referring provider: Plotnikov, Evie Lacks, MD ? ?Reason for consult/chief complaint: Diarrhea, Abdominal Pain (Generalized abd pains), Bloated, Gas, Hemorrhoids (Rectal bleeding 1 month ago when wiping, sometime with protrusion and pain when having a BM), and Constipation (Pt requesting Linzess/) ? ? ?Subjective  ?HPI: ?Heidi Good was last seen in the office October 2020 for chronic constipation and an anal fissure.  Clinical history detailed in that note.  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 ?Trial of Linzess caused diarrhea, so patient was using Trulance on average twice a week when I last saw her. ?Dr. Alain Marion manages her mild asthma and thyroid disorder. ?Dr. Clovis Pu a behavioral health sees her for anxiety and paranoid schizophrenia, most recently March of this year.   ? ? ?Heidi Good was seen with a sign interpreter today.  She has chronic constipation worse in the last few months and she has run out of Linzess that she had previously found helpful, she is requesting a refill at the 290 mcg daily dose.  She also has some rectal pain with bleeding and a feeling of protrusion in that area.  She has generalized bloating and abdominal discomfort. ? ?I reminded her of the previous anorectal manometry testing and pelvic floor PT, and he confirms it was difficult for her to participate in that. ? ?ROS: ? ?Review of Systems ?Denies chest pain palpitations dyspnea or dizziness. ?Denies dysuria or hematuria ? ?Past Medical History: ?Past Medical History:  ?Diagnosis Date  ? Anxiety   ? Constipation   ? Deafness   ? Depression   ? Diabetes mellitus ([place])   ? Gastroparesis   ? Hyperlipemia   ? Hypothyroidism   ? ? ? ?Past Surgical History: ?Past Surgical  History:  ?Procedure Laterality Date  ? ABDOMINAL HYSTERECTOMY    ? ANAL RECTAL MANOMETRY N/A 03/07/2017  ? Procedure: ANO RECTAL MANOMETRY;  Surgeon: Mauri Pole, MD;  Location: [facility];  Service: Endoscopy;  Laterality: N/A;  ? BUNIONECTOMY Left   ? ? ? ?Family History: ?Family History  ?Problem Relation Age of Onset  ? Diabetes Mother   ? Colon polyps Mother   ?     over 150 polyps detected  ? Heart disease Father   ? Multiple myeloma Father   ? Emphysema Father   ? Heart attack Father   ? Liver cancer Maternal Grandmother   ?     not primary source, spread to her liver  ? Heart attack Maternal Grandfather   ? Emphysema Paternal Grandfather   ? Colon cancer Neg Hx   ? Esophageal cancer Neg Hx   ? Rectal cancer Neg Hx   ? Stomach cancer Neg Hx   ? ? ?Social History: ?Social History  ? ?Socioeconomic History  ? Marital status: Divorced  ?  Spouse name: Not on file  ? Number of children: 0  ? Years of education: Not on file  ? Highest education level: Not on file  ?Occupational History  ? Occupation: disablied  ?  Employer: RETIRED  ?Tobacco Use  ? Smoking status: Never  ? Smokeless tobacco: Never  ?Vaping Use  ? Vaping Use: Never used  ?Substance and Sexual Activity  ? Alcohol use: Never  ? Drug use: Never  ? Sexual activity: Not on file  ?  Other Topics Concern  ? Not on file  ?Social History Narrative  ? Not on file  ? ?Social Determinants of Health  ? ?Financial Resource Strain: Not on file  ?Food Insecurity: Not on file  ?Transportation Needs: Not on file  ?Physical Activity: Not on file  ?Stress: Not on file  ?Social Connections: Not on file  ? ? ?Allergies: ?Allergies  ?Allergen Reactions  ? Doxycycline Nausea And Vomiting  ? Hydrocodone   ?  Nausea from cough syrup - ?? Very remote  ? Other   ?  Dog, cat, nuts, grass, trees  ? ? ?Outpatient Meds: ?Current Outpatient Medications  ?Medication Sig Dispense Refill  ? albuterol (VENTOLIN HFA) 108 (90 Base) MCG/ACT inhaler Inhale 2 puffs into the lungs  every 4 (four) hours as needed for wheezing or shortness of breath. 1 each 5  ? AMBULATORY NON FORMULARY MEDICATION Nitroglycerine ointment 0.125 %  ?Apply a pea sized amount internally four times daily. ?Dispense 30 GM 1 refill 30 g 1  ? ARIPiprazole (ABILIFY) 15 MG tablet Take 1 tablet (15 mg total) by mouth daily. 90 tablet 1  ? fluticasone (FLOVENT HFA) 110 MCG/ACT inhaler Inhale 2 puffs into the lungs 2 (two) times daily. 1 Inhaler 12  ? levothyroxine (SYNTHROID) 75 MCG tablet TAKE 1 TABLET(75 MCG) BY MOUTH DAILY 90 tablet 2  ? meloxicam (MOBIC) 15 MG tablet Take 1 tablet (15 mg total) by mouth daily as needed for pain. 30 tablet 2  ? potassium chloride (KLOR-CON) 8 MEQ tablet TAKE 1 TABLET(8 MEQ) BY MOUTH DAILY 90 tablet 2  ? ?No current facility-administered medications for this visit.  ? ? ? ? ?___________________________________________________________________ ?Objective  ? ?Exam: ? ?BP 134/80   Pulse (!) 45 Comment: irregular  Ht 5' 3.5" (1.613 m) Comment: height measured without shoes  Wt 191 lb 2 oz (86.7 kg)   BMI 33.33 kg/m?  ?Wt Readings from Last 3 Encounters:  ?09/26/21 191 lb 2 oz (86.7 kg)  ?08/20/21 193 lb (87.5 kg)  ?04/18/21 191 lb (86.6 kg)  ? ? ?General: Well-appearing ?Eyes: sclera anicteric, no redness ?ENT: oral mucosa moist without lesions, no cervical or supraclavicular lymphadenopathy ?CV: Pulse irregular and about 50 ?Resp: clear to auscultation bilaterally, normal RR and effort noted ?GI: soft, no tenderness, with active bowel sounds. No guarding or palpable organomegaly noted. ?Rectal: Tender posterior anal fissure with sentinel tag, limits DRE ? ?Labs: ?No recent data ? ?Last EKG January 2020 with PVCs and a QTc of 488 ? ?Assessment: ?Encounter Diagnoses  ?Name Primary?  ? Anal fissure Yes  ? Rectal bleeding   ? Chronic constipation   ? Generalized abdominal pain   ? ? ?Chronic constipation from dyssynergic defecation/pelvic floor dysfunction.  She had not previously wished to  continue with pelvic PT, nor does she wish to at this point. ?She now tells me the Linzess works fairly well and she would like to refill it, so I have done so at the 290 mcg daily dose. ? ?She has recurrence of anal fissure.  This will be treated with combination of RectiCare and nitroglycerin ointment 3 times daily and she will return for further evaluation in about 6 weeks ? ?Incidentally, her pulse was slow and irregular today, that does not appear to have previously been an issue for her.  I will forward my note to Dr. Alain Marion and separately message him so he can get her in for an EKG if this has not previously been evaluated. ? ?Thank  you for the courtesy of this consult.  Please call me with any questions or concerns. ? ?Nelida Meuse III ? ?CC: Referring provider noted above ? ?

## 2021-09-26 NOTE — Patient Instructions (Signed)
If you are age 60 or older, your body mass index should be between 23-30. Your Body mass index is 33.33 kg/m?Marland Kitchen If this is out of the aforementioned range listed, please consider follow up with your Primary Care Provider. ? ?If you are age 61 or younger, your body mass index should be between 19-25. Your Body mass index is 33.33 kg/m?Marland Kitchen If this is out of the aformentioned range listed, please consider follow up with your Primary Care Provider.  ? ?________________________________________________________ ? ?The  GI providers would like to encourage you to use [facility] to communicate with providers for non-urgent requests or questions.  Due to long hold times on the telephone, sending your provider a message by [facility] may be a faster and more efficient way to get a response.  Please allow 48 business hours for a response.  Please remember that this is for non-urgent requests.  ?_______________________________________________________ ? ?We have sent a prescription for nitroglycerin 0.125% gel to [facility]. You should apply a pea size amount to your rectum three times daily x 6-8 weeks. ? ?[facility]'s information is below: ?Address: [address] [place], [place] [address]  ?[phone] ? ?*Please DO NOT go directly from our office to pick up this medication! Give the pharmacy 1 day to process the prescription as this is compounded and takes time to make. ? ?Please use the Recticare 3 times a day. We have given you a few samples and a coupon for this. It is purchased over the counter. ? ?It was a pleasure to see you today! ? ?Thank you for trusting me with your gastrointestinal care!   ? ? ?

## 2021-10-17 DIAGNOSIS — H2513 Age-related nuclear cataract, bilateral: Secondary | ICD-10-CM | POA: Diagnosis not present

## 2021-10-17 DIAGNOSIS — H0102B Squamous blepharitis left eye, upper and lower eyelids: Secondary | ICD-10-CM | POA: Diagnosis not present

## 2021-10-17 DIAGNOSIS — H0102A Squamous blepharitis right eye, upper and lower eyelids: Secondary | ICD-10-CM | POA: Diagnosis not present

## 2021-10-29 DIAGNOSIS — L814 Other melanin hyperpigmentation: Secondary | ICD-10-CM | POA: Diagnosis not present

## 2021-10-29 DIAGNOSIS — D1801 Hemangioma of skin and subcutaneous tissue: Secondary | ICD-10-CM | POA: Diagnosis not present

## 2021-10-29 DIAGNOSIS — L821 Other seborrheic keratosis: Secondary | ICD-10-CM | POA: Diagnosis not present

## 2021-10-29 DIAGNOSIS — L820 Inflamed seborrheic keratosis: Secondary | ICD-10-CM | POA: Diagnosis not present

## 2021-10-29 DIAGNOSIS — D225 Melanocytic nevi of trunk: Secondary | ICD-10-CM | POA: Diagnosis not present

## 2021-11-07 ENCOUNTER — Ambulatory Visit (INDEPENDENT_AMBULATORY_CARE_PROVIDER_SITE_OTHER): Payer: Medicare Other | Admitting: Internal Medicine

## 2021-11-07 ENCOUNTER — Encounter: Payer: Self-pay | Admitting: Internal Medicine

## 2021-11-07 VITALS — BP 140/80 | HR 80 | Ht 63.5 in | Wt 193.0 lb

## 2021-11-07 DIAGNOSIS — R7303 Prediabetes: Secondary | ICD-10-CM | POA: Diagnosis not present

## 2021-11-07 DIAGNOSIS — R21 Rash and other nonspecific skin eruption: Secondary | ICD-10-CM | POA: Diagnosis not present

## 2021-11-07 DIAGNOSIS — F321 Major depressive disorder, single episode, moderate: Secondary | ICD-10-CM

## 2021-11-07 MED ORDER — CLOTRIMAZOLE-BETAMETHASONE 1-0.05 % EX CREA: 1.0000 | TOPICAL_CREAM | Freq: Every day | CUTANEOUS | 0 refills | Status: AC

## 2021-11-07 NOTE — Patient Instructions (Signed)
Please take all new medication as prescribed - the cream as needed  Please continue all other medications as before, and refills have been done if requested.  Please have the pharmacy call with any other refills you may need.  Please keep your appointments with your specialists as you may have planned

## 2021-11-07 NOTE — Progress Notes (Signed)
Patient ID: Heidi Good, female   DOB: 09/15/1960, 61 y.o.   MRN: [id number]        Chief Complaint: follow up neck rash, preDM, depression       HPI:  Heidi Good is a 61 y.o. female here with c/o low mid anterior neck area about 3 cm with itching, red x 3 days for unclear reason, without worsening pain, fever, chills, and Pt denies chest pain, increased sob or doe, wheezing, orthopnea, PND, increased LE swelling, palpitations, dizziness or syncope.   Pt denies polydipsia, polyuria, or new focal neuro s/s.    Pt denies fever, wt loss, night sweats, loss of appetite, or other constitutional symptoms  Denies worsening depressive symptoms, suicidal ideation, or panic        Wt Readings from Last 3 Encounters:  11/07/21 193 lb (87.5 kg)  09/26/21 191 lb 2 oz (86.7 kg)  08/20/21 193 lb (87.5 kg)   BP Readings from Last 3 Encounters:  11/07/21 140/80  09/26/21 134/80  08/20/21 130/90         Past Medical History:  Diagnosis Date   Anxiety    Constipation    Deafness    Depression    Diabetes mellitus ([place])    Gastroparesis    Hyperlipemia    Hypothyroidism    Past Surgical History:  Procedure Laterality Date   ABDOMINAL HYSTERECTOMY     ANAL RECTAL MANOMETRY N/A 03/07/2017   Procedure: ANO RECTAL MANOMETRY;  Surgeon: Mauri Pole, MD;  Location: [facility];  Service: Endoscopy;  Laterality: N/A;   BUNIONECTOMY Left     reports that she has never smoked. She has never used smokeless tobacco. She reports that she does not drink alcohol and does not use drugs. family history includes Colon polyps in her mother; Diabetes in her mother; Emphysema in her father and paternal grandfather; Heart attack in her father and maternal grandfather; Heart disease in her father; Liver cancer in her maternal grandmother; Multiple myeloma in her father. Allergies  Allergen Reactions   Doxycycline Nausea And Vomiting   Hydrocodone     Nausea from cough syrup - ?? Very remote   Other      Dog, cat, nuts, grass, trees   Current Outpatient Medications on File Prior to Visit  Medication Sig Dispense Refill   albuterol (VENTOLIN HFA) 108 (90 Base) MCG/ACT inhaler Inhale 2 puffs into the lungs every 4 (four) hours as needed for wheezing or shortness of breath. 1 each 5   AMBULATORY NON FORMULARY MEDICATION Nitroglycerine ointment 0.125 %  Apply a pea sized amount internally four times daily. Dispense 30 GM 1 refill 30 g 1   ARIPiprazole (ABILIFY) 15 MG tablet Take 1 tablet (15 mg total) by mouth daily. 90 tablet 1   fluticasone (FLOVENT HFA) 110 MCG/ACT inhaler Inhale 2 puffs into the lungs 2 (two) times daily. 1 Inhaler 12   levothyroxine (SYNTHROID) 75 MCG tablet TAKE 1 TABLET(75 MCG) BY MOUTH DAILY 90 tablet 2   meloxicam (MOBIC) 15 MG tablet Take 1 tablet (15 mg total) by mouth daily as needed for pain. 30 tablet 2   potassium chloride (KLOR-CON) 8 MEQ tablet TAKE 1 TABLET(8 MEQ) BY MOUTH DAILY 90 tablet 2   No current facility-administered medications on file prior to visit.        ROS:  All others reviewed and negative.  Objective        PE:  BP 140/80 (BP Location: Right Arm, Patient Position:  Sitting, Cuff Size: Large)   Pulse 80   Ht 5' 3.5" (1.613 m)   Wt 193 lb (87.5 kg)   SpO2 99%   BMI 33.65 kg/m                 Constitutional: Pt appears in NAD               HENT: Head: NCAT.                Right Ear: External ear normal.                 Left Ear: External ear normal.                Eyes: . Pupils are equal, round, and reactive to light. Conjunctivae and EOM are normal               Nose: without d/c or deformity               Neck: Neck supple. Gross normal ROM               Cardiovascular: Normal rate and regular rhythm.                 Pulmonary/Chest: Effort normal and breath sounds without rales or wheezing.                             Neurological: Pt is alert. At baseline orientation, motor grossly intact               Skin: LE edema - none;  lower mid anterior neck with 3 cm area nontender erythematous superficial lesion               Psychiatric: Pt behavior is normal without agitation , mild depressed affect  Micro: none  Cardiac tracings I have personally interpreted today:  none  Pertinent Radiological findings (summarize): none   Lab Results  Component Value Date   WBC 10.7 (H) 03/07/2020   HGB 15.6 (H) 03/07/2020   HCT 45.8 03/07/2020   PLT 184.0 03/07/2020   GLUCOSE 92 03/06/2021   CHOL 185 03/07/2020   TRIG 241.0 (H) 03/07/2020   HDL 40.90 03/07/2020   LDLDIRECT 52.0 03/07/2020   LDLCALC 55 03/10/2015   ALT 27 03/06/2021   AST 39 (H) 03/06/2021   NA 136 03/06/2021   K 3.8 03/06/2021   CL 98 03/06/2021   CREATININE 0.70 03/06/2021   BUN 7 03/06/2021   CO2 30 03/06/2021   TSH 0.58 03/06/2021   HGBA1C 5.9 03/06/2021   Assessment/Plan:  Heidi Good is a 61 y.o. White or Caucasian [1] female with  has a past medical history of Anxiety, Constipation, Deafness, Depression, Diabetes mellitus ([place]), Gastroparesis, Hyperlipemia, and Hypothyroidism.  Prediabetes Lab Results  Component Value Date   HGBA1C 5.9 03/06/2021   Stable, pt to continue current medical treatment  - diet, wt control, excercise   Major depressive disorder, single episode, moderate ([facility]) Mild persistent recent, no SI or HI, declines change in tx, to f/u psychiatry  Rash C/w dermatitis, for lotrisone topical asd prn,  to f/u any worsening symptoms or concerns  Followup: Return if symptoms worsen or fail to improve.  Cathlean Cower, MD 11/10/2021 4:02 PM [facility]

## 2021-11-09 ENCOUNTER — Other Ambulatory Visit: Payer: Self-pay | Admitting: Internal Medicine

## 2021-11-09 DIAGNOSIS — Z1231 Encounter for screening mammogram for malignant neoplasm of breast: Secondary | ICD-10-CM

## 2021-11-10 ENCOUNTER — Encounter: Payer: Self-pay | Admitting: Internal Medicine

## 2021-11-10 DIAGNOSIS — R21 Rash and other nonspecific skin eruption: Secondary | ICD-10-CM | POA: Insufficient documentation

## 2021-11-10 NOTE — Assessment & Plan Note (Signed)
C/w dermatitis, for lotrisone topical asd prn,  to f/u any worsening symptoms or concerns

## 2021-11-10 NOTE — Assessment & Plan Note (Signed)
Lab Results  Component Value Date   HGBA1C 5.9 03/06/2021   Stable, pt to continue current medical treatment  - diet, wt control, excercise

## 2021-11-10 NOTE — Assessment & Plan Note (Signed)
Mild persistent recent, no SI or HI, declines change in tx, to f/u psychiatry

## 2021-11-14 ENCOUNTER — Encounter: Payer: Self-pay | Admitting: Gastroenterology

## 2021-11-14 ENCOUNTER — Ambulatory Visit (INDEPENDENT_AMBULATORY_CARE_PROVIDER_SITE_OTHER): Payer: Medicare Other | Admitting: Gastroenterology

## 2021-11-14 VITALS — BP 146/82 | HR 77 | Ht 63.5 in | Wt 194.0 lb

## 2021-11-14 DIAGNOSIS — K5909 Other constipation: Secondary | ICD-10-CM | POA: Diagnosis not present

## 2021-11-14 DIAGNOSIS — K602 Anal fissure, unspecified: Secondary | ICD-10-CM | POA: Diagnosis not present

## 2021-11-14 NOTE — Patient Instructions (Signed)
If you are age 60 or older, your body mass index should be between 23-30. Your Body mass index is 33.83 kg/m. If this is out of the aforementioned range listed, please consider follow up with your Primary Care Provider.  If you are age 55 or younger, your body mass index should be between 19-25. Your Body mass index is 33.83 kg/m. If this is out of the aformentioned range listed, please consider follow up with your Primary Care Provider.   ________________________________________________________  The [facility] providers would like to encourage you to use [facility] to communicate with providers for non-urgent requests or questions.  Due to long hold times on the telephone, sending your provider a message by [facility] may be a faster and more efficient way to get a response.  Please allow 48 business hours for a response.  Please remember that this is for non-urgent requests.  _______________________________________________________  It was a pleasure to see you today!  Thank you for trusting me with your gastrointestinal care!

## 2021-11-14 NOTE — Progress Notes (Signed)
[facility] Progress Note  Chief Complaint: Chronic constipation and anal fissure  Subjective  History: Heidi Good was seen in follow-up for anal fissure, discovered on office visit May 17.  She had a fissure years ago when I had seen her, and had extensive work-up for constipation indicating dyssynergy defecation.  It was difficult for her to participate in biofeedback due to her hearing impairment.  At last visit she reported good effect from Linzess 290 mcg daily and requested a refill. Last colonoscopy Feb 2018 with no polyps.  Heidi Good was seen with a sign interpreter today.  Her rectal pain and bleeding have resolved.  She uses Linzess 290 mcg 1-2 times a week for a "cleanout".  She stopped using the lidocaine when the pain stopped and has continued the nitroglycerin ointment 4 times daily.  ROS: Cardiovascular:  no chest pain Respiratory: no dyspnea  The patient's Past Medical, Family and Social History were reviewed and are on file in the EMR.  Objective:  Med list reviewed  Current Outpatient Medications:    albuterol (VENTOLIN HFA) 108 (90 Base) MCG/ACT inhaler, Inhale 2 puffs into the lungs every 4 (four) hours as needed for wheezing or shortness of breath., Disp: 1 each, Rfl: 5   AMBULATORY NON FORMULARY MEDICATION, Nitroglycerine ointment 0.125 %  Apply a pea sized amount internally four times daily. Dispense 30 GM 1 refill, Disp: 30 g, Rfl: 1   ARIPiprazole (ABILIFY) 15 MG tablet, Take 1 tablet (15 mg total) by mouth daily., Disp: 90 tablet, Rfl: 1   clotrimazole-betamethasone (LOTRISONE) cream, Apply 1 Application topically daily., Disp: 30 g, Rfl: 0   D2000 ULTRA STRENGTH 50 MCG (2000 UT) CAPS, SMARTSIG:1 By Mouth, Disp: , Rfl:    estradiol (ESTRACE) 1 MG tablet, Take by mouth., Disp: , Rfl:    fluticasone (FLOVENT HFA) 110 MCG/ACT inhaler, Inhale 2 puffs into the lungs 2 (two) times daily., Disp: 1 Inhaler, Rfl: 12   levothyroxine (SYNTHROID) 75 MCG tablet, TAKE 1  TABLET(75 MCG) BY MOUTH DAILY, Disp: 90 tablet, Rfl: 2   meloxicam (MOBIC) 15 MG tablet, Take 1 tablet (15 mg total) by mouth daily as needed for pain., Disp: 30 tablet, Rfl: 2   potassium chloride (KLOR-CON) 8 MEQ tablet, TAKE 1 TABLET(8 MEQ) BY MOUTH DAILY, Disp: 90 tablet, Rfl: 2   traMADol (ULTRAM) 50 MG tablet, SMARTSIG:1 By Mouth, Disp: , Rfl:    Vital signs in last 24 hrs: Vitals:   11/14/21 1345  BP: (!) 146/82  Pulse: 77   Wt Readings from Last 3 Encounters:  11/14/21 194 lb (88 kg)  11/07/21 193 lb (87.5 kg)  09/26/21 191 lb 2 oz (86.7 kg)    Physical Exam Her female son interpreter was present for the  Abdomen: soft, no tenderness, with active bowel sounds. No guarding or palpable hepatosplenomegaly. Normal perianal exam.  Mild tenderness posteriorly, shallow remnant of fissure palpated.  Labs:   ___________________________________________ Radiologic studies:   ____________________________________________ Other:   _____________________________________________ Assessment & Plan  Assessment: Encounter Diagnoses  Name Primary?   Chronic constipation Yes   Anal fissure     Anal fissure considerably improved by symptoms and exam. 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  Plan: Decrease nitroglycerin to twice daily for next 3 to 4 weeks, then discontinue.  Keep that on the lidocaine on hand if she has recurrent symptoms.  Continue current use of Linzess for chronic constipation.  Next routine colonoscopy February 2028  See me in the interim  as needed.    Heidi Good

## 2021-11-28 ENCOUNTER — Ambulatory Visit: Payer: Medicare Other

## 2021-11-28 ENCOUNTER — Ambulatory Visit
Admission: RE | Admit: 2021-11-28 | Discharge: 2021-11-28 | Disposition: A | Discharge status: Home / Self Care | Payer: Medicare Other | Source: Ambulatory Visit | Attending: Internal Medicine | Admitting: Internal Medicine

## 2021-11-28 DIAGNOSIS — Z1231 Encounter for screening mammogram for malignant neoplasm of breast: Secondary | ICD-10-CM

## 2022-01-09 DIAGNOSIS — B078 Other viral warts: Secondary | ICD-10-CM | POA: Diagnosis not present

## 2022-01-29 ENCOUNTER — Encounter: Payer: Self-pay | Admitting: Psychiatry

## 2022-01-29 ENCOUNTER — Ambulatory Visit (INDEPENDENT_AMBULATORY_CARE_PROVIDER_SITE_OTHER): Payer: Medicare Other | Admitting: Psychiatry

## 2022-01-29 DIAGNOSIS — F200 Paranoid schizophrenia: Secondary | ICD-10-CM | POA: Diagnosis not present

## 2022-01-29 DIAGNOSIS — F99 Mental disorder, not otherwise specified: Secondary | ICD-10-CM

## 2022-01-29 DIAGNOSIS — F5105 Insomnia due to other mental disorder: Secondary | ICD-10-CM

## 2022-01-29 MED ORDER — ARIPIPRAZOLE 15 MG PO TABS: 15.0000 mg | ORAL_TABLET | Freq: Every day | ORAL | 2 refills | Status: DC

## 2022-01-29 NOTE — Progress Notes (Signed)
Heidi Good [id number] [birth year] 60 y.o.  Subjective:   Patient ID:  Heidi Good is a 61 y.o. (DOB 1961/01/31) female.  Chief Complaint:  Chief Complaint  Patient presents with   Follow-up    Paranoid schizophrenia (HCC)     Depression        Associated symptoms include no decreased concentration and no suicidal ideas.  patient is deaf and seen with an interpreter.  She has notes. Heidi Good presents to the office today for follow-up of schizophrenia.  seen November, 2020.  She continues on Abilify 15 mg daily and Xanax is the only psychiatric meds and no meds were changed.  Patient seen today the with the assistance of a translator due to her deafness.  Stress M declining health and she feels more nervous and worried.  Helps mother.  Takes alprazolam occ with little benefit.  Sleep is affected by care of mother.     M gets confused and dx dementia and sleeping a lot. B comes and helps.  She pays the bills and not the mother.    No longer has funny feelings and doesn't need the Xanax.  Denies feeling afraid at home.  B will move in when M passes away.  Has several dogs.  [place] Shepherds make her feel safer.  Abilify 15 mg increase helped her.     In November bothered bc could see something and feel something walk by outside and dog acted funny.  Then brother came over and checked things.  She felt someone was walking around outside.  Says this happened 7 times by mid DEC.  She feels in danger from this incidents.  Never saw anyone but believed there was someone there.  Police came by and didn't find anything.  Thinks it's an old friend.  M having problems with dementia.  Pt takes care of her.  Pt helps her with meds and cooks for her and helps bathe hers.  Rare Xanax is less helpful.  Patient reports stable mood and denies depressed or irritable moods.  Patient denies any recent difficulty with anxiety except as noted. No fear.  Patient denies difficulty with sleep  initiation or maintenance. Denies appetite disturbance.  Patient reports that energy and motivation have been good.  Patient denies any difficulty with concentration.  Patient denies any suicidal ideation.  15 mg Abilify helped her feel better, calmer than did the 10 mg Abilify.  No SE and claims compliance.  Denies voices, depression, anger on this dosage.  Sleep good 8 hours.  No drowsiness. Watches news.  05/24/20 appt noted: seen with ASL interpreter M in Hospice with poor health.  Not depressed but sad.  Not anxious.   B is helping some.   Mother's sister Heidi Good is helping.  F died 9 years ago.  Loves Mother.  Will live alone in the house after her mother dies. Patient reports stable mood and denies depressed or irritable moods.  Patient denies any recent difficulty with anxiety.  Patient denies difficulty with sleep initiation or maintenance. Denies appetite disturbance.  Patient reports that energy and motivation have been good.  Patient denies any difficulty with concentration.  Patient denies any suicidal ideation. No fear. Plan: No med changes  09/20/2020 appointment with the following noted: Seen with interpreter Healthy no Covid. M died. Going through process of grief.  Misses mother.  B helping a lot. No fearful or unusual anxiety.  2 dogs. I feel at peace.  No excess crying.  Sleep is good.  No problems with Abilify 15 mg daily. Rare Xanax 0.5-1 mg. Pt drives without problems. Plan: No med changes  01/24/2021 appointment with the following noted: Rare Xanax with little anxiety.  Peace after mother died. Takes Abilify consistently 15 mg nightly. Sleeps well after 8 hours. No sig fear nor anxiety.  B checks on her.  Supportive local community.  Has dogs. No SE.   Painting and doing things around the house.  No problems driving. Plan: no med changes  07/26/21 appt noted: seen with interpretor Fine.  Relaxed.  No problems with meds. Dog 21 yo with cancer. Put to sleep.   Consistent  with meds Abilify 15 mg.  Has 4 dogs now.  Takes care of them herself. Stopped Xanax bc not needed. Good sleep and energy. B helps her.  His wife takes her out.  B 58 is [employer] cabin crew.   Plan: Continue current meds Abilify 15 mg daily and rare Xanax 0.5 to 1 mg as needed anxiety  9/19 /2023 appointment noted:  seen with translator as always Consistent with meds.  Been stable. B  and sister in law are all supportive.   Eats fruits and some vegetables.  So likes this time of year. Eats [place] food. And some meats. No sig anxiety or fear and thinks it is due to the benefit of the meds. Tries not to sleep to be able to sleep at night.  Minimal alprazolam. Awakens 630 AM and gets 8 hours of sleep .  Used to oversleep but not anymore. Consitent with Abilify 15 without SE  Other psychiatric medications tried include perphenazine which she did not like, loxapine which she did not like, and Saphris.  She complained that it gave her strange feeling under the tongue.  She has taken other psychiatric medications in the past but she does not know the names of them and we have not been able to get the records. Abilify 15 benefit.  Review of Systems:  Review of Systems  HENT:         Deaf  Cardiovascular:  Negative for chest pain and palpitations.  Musculoskeletal:  Positive for arthralgias.  Neurological:  Negative for tremors.  Psychiatric/Behavioral:  Negative for agitation, behavioral problems, confusion, decreased concentration, dysphoric mood, hallucinations, self-injury, sleep disturbance and suicidal ideas. The patient is not nervous/anxious and is not hyperactive.     Medications: I have reviewed the patient's current medications.  Current Outpatient Medications  Medication Sig Dispense Refill   albuterol (VENTOLIN HFA) 108 (90 Base) MCG/ACT inhaler Inhale 2 puffs into the lungs every 4 (four) hours as needed for wheezing or shortness of breath. 1 each 5   AMBULATORY NON FORMULARY  MEDICATION Nitroglycerine ointment 0.125 %  Apply a pea sized amount internally four times daily. Dispense 30 GM 1 refill 30 g 1   clotrimazole-betamethasone (LOTRISONE) cream Apply 1 Application topically daily. 30 g 0   D2000 ULTRA STRENGTH 50 MCG (2000 UT) CAPS SMARTSIG:1 By Mouth     estradiol (ESTRACE) 1 MG tablet Take by mouth.     fluticasone (FLOVENT HFA) 110 MCG/ACT inhaler Inhale 2 puffs into the lungs 2 (two) times daily. 1 Inhaler 12   levothyroxine (SYNTHROID) 75 MCG tablet TAKE 1 TABLET(75 MCG) BY MOUTH DAILY 90 tablet 2   meloxicam (MOBIC) 15 MG tablet Take 1 tablet (15 mg total) by mouth daily as needed for pain. 30 tablet 2   potassium chloride (KLOR-CON) 8 MEQ tablet TAKE 1 TABLET(8 MEQ)  BY MOUTH DAILY 90 tablet 2   traMADol (ULTRAM) 50 MG tablet SMARTSIG:1 By Mouth     ARIPiprazole (ABILIFY) 15 MG tablet Take 1 tablet (15 mg total) by mouth daily. 90 tablet 2   No current facility-administered medications for this visit.    Medication Side Effects: None  Allergies:  Allergies  Allergen Reactions   Doxycycline Nausea And Vomiting   Hydrocodone     Nausea from cough syrup - ?? Very remote   Other     Dog, cat, nuts, grass, trees    Past Medical History:  Diagnosis Date   Anxiety    Constipation    Deafness    Depression    Diabetes mellitus (HCC)    Gastroparesis    Hyperlipemia    Hypothyroidism     Family History  Problem Relation Age of Onset   Diabetes Mother    Colon polyps Mother        over 150 polyps detected   Heart disease Father    Multiple myeloma Father    Emphysema Father    Heart attack Father    Liver cancer Maternal Grandmother        not primary source, spread to her liver   Heart attack Maternal Grandfather    Emphysema Paternal Grandfather    Colon cancer Neg Hx    Esophageal cancer Neg Hx    Rectal cancer Neg Hx    Stomach cancer Neg Hx     Social History   Socioeconomic History   Marital status: Divorced    Spouse  name: Not on file   Number of children: 0   Years of education: Not on file   Highest education level: Not on file  Occupational History   Occupation: disablied    Employer: RETIRED  Tobacco Use   Smoking status: Never   Smokeless tobacco: Never  Vaping Use   Vaping Use: Never used  Substance and Sexual Activity   Alcohol use: Never   Drug use: Never   Sexual activity: Not on file  Other Topics Concern   Not on file  Social History Narrative   Not on file   Social Determinants of Health   Financial Resource Strain: Not on file  Food Insecurity: Not on file  Transportation Needs: Not on file  Physical Activity: Not on file  Stress: Not on file  Social Connections: Not on file  Intimate Partner Violence: Not on file   She drives.  Still lives with her neighbor.  Past Medical History, Surgical history, Social history, and Family history were reviewed and updated as appropriate.   Please see review of systems for further details on the patient's review from today.   Objective:   Physical Exam:  There were no vitals taken for this visit.  Physical Exam Constitutional:      General: She is not in acute distress.    Appearance: She is well-developed.  Musculoskeletal:        General: No deformity.  Neurological:     Mental Status: She is alert and oriented to person, place, and time.     Motor: No tremor.     Coordination: Coordination normal.     Gait: Gait normal.  Psychiatric:        Attention and Perception: She is attentive. She does not perceive auditory hallucinations.        Mood and Affect: Mood is not anxious or depressed. Affect is not labile, blunt or inappropriate.  Speech: Speech normal.        Behavior: Behavior normal. Behavior is not agitated, aggressive or hyperactive. Behavior is cooperative.        Thought Content: Thought content is not paranoid or delusional. Thought content does not include homicidal or suicidal ideation. Thought content  does not include suicidal plan.        Cognition and Memory: Cognition normal.        Judgment: Judgment normal.     Comments: Insight fair. No auditory or visual hallucinations. No delusions.  Pleasant     Lab Review:     Component Value Date/Time   NA 136 03/06/2021 1358   K 3.8 03/06/2021 1358   CL 98 03/06/2021 1358   CO2 30 03/06/2021 1358   GLUCOSE 92 03/06/2021 1358   BUN 7 03/06/2021 1358   CREATININE 0.70 03/06/2021 1358   CALCIUM 9.6 03/06/2021 1358   PROT 7.8 03/06/2021 1358   ALBUMIN 4.2 03/06/2021 1358   AST 39 (H) 03/06/2021 1358   ALT 27 03/06/2021 1358   ALKPHOS 125 (H) 03/06/2021 1358   BILITOT 1.0 03/06/2021 1358   GFRNONAA 95 07/11/2008 1616   GFRAA 115 07/11/2008 1616       Component Value Date/Time   WBC 10.7 (H) 03/07/2020 1353   RBC 5.23 (H) 03/07/2020 1353   HGB 15.6 (H) 03/07/2020 1353   HCT 45.8 03/07/2020 1353   PLT 184.0 03/07/2020 1353   MCV 87.7 03/07/2020 1353   MCHC 34.0 03/07/2020 1353   RDW 13.0 03/07/2020 1353   LYMPHSABS 3.2 03/07/2020 1353   MONOABS 0.9 03/07/2020 1353   EOSABS 0.3 03/07/2020 1353   BASOSABS 0.1 03/07/2020 1353    No results found for: "POCLITH", "LITHIUM"   No results found for: "PHENYTOIN", "PHENOBARB", "VALPROATE", "CBMZ"   .res Assessment: Plan:    Dorene was seen today for follow-up.  Diagnoses and all orders for this visit:  Paranoid schizophrenia ([place]) -     ARIPiprazole (ABILIFY) 15 MG tablet; Take 1 tablet (15 mg total) by mouth daily.  Insomnia due to other mental disorder    Greater than 50% of face to face time with patient and an interpretor for 30 mins was spent on counseling and coordination of care. We discussed potential metabolic side effects associated with atypical antipsychotics, as well as potential risk for movement side effects. Advised pt to contact office if movement side effects occur.  Good response with meds.  No paranoid feelings with good response to Abilify 15 mg.   No change indicated.Marland Kitchen No AIM  She is agreed to continue Abilify 15 mg which she has taken for many years in the past to good effect.  She had a brief period of time where she was convinced it was not the right medicine for her.  We were not able to find any other medications that she felt satisfied taking.  It does not not appear that she has active psychosis at the moment.  She has a history of repeated paranoid psychotic episodes.  During some of these she could become agitated and aggressive with her mother.  She agrees that this is a good medicine for her  No SE.  Her labs are followed by her primary care doctor. Normal glucose and Hgb A1C, cholesterol 241 late October 2021.     Dx OSA but not using CPAP.  Encouraged to do so.  She is open to considering it.  Follow-up 6-9  months.    Lynder Parents  MD, DFAPA  Please see After Visit Summary for patient specific instructions.  Future Appointments  Date Time Provider [place]  02/19/2022  1:40 PM Plotnikov, Evie Lacks, MD [facility] None    No orders of the defined types were placed in this encounter.     -------------------------------

## 2022-02-11 ENCOUNTER — Telehealth: Payer: Self-pay | Admitting: Internal Medicine

## 2022-02-11 NOTE — Telephone Encounter (Signed)
LVM for pt to rtn my call to schedule AWV-S with [facility] call back # [phone] 

## 2022-02-19 ENCOUNTER — Ambulatory Visit (INDEPENDENT_AMBULATORY_CARE_PROVIDER_SITE_OTHER): Payer: Medicare Other

## 2022-02-19 ENCOUNTER — Ambulatory Visit (INDEPENDENT_AMBULATORY_CARE_PROVIDER_SITE_OTHER): Payer: Medicare Other | Admitting: Internal Medicine

## 2022-02-19 ENCOUNTER — Encounter: Payer: Self-pay | Admitting: Internal Medicine

## 2022-02-19 VITALS — BP 140/82 | HR 55 | Temp 99.0°F | Ht 63.5 in | Wt 191.0 lb

## 2022-02-19 DIAGNOSIS — J4520 Mild intermittent asthma, uncomplicated: Secondary | ICD-10-CM

## 2022-02-19 DIAGNOSIS — J3089 Other allergic rhinitis: Secondary | ICD-10-CM | POA: Diagnosis not present

## 2022-02-19 DIAGNOSIS — Z0000 Encounter for general adult medical examination without abnormal findings: Secondary | ICD-10-CM | POA: Diagnosis not present

## 2022-02-19 DIAGNOSIS — K219 Gastro-esophageal reflux disease without esophagitis: Secondary | ICD-10-CM

## 2022-02-19 DIAGNOSIS — R7303 Prediabetes: Secondary | ICD-10-CM

## 2022-02-19 DIAGNOSIS — E034 Atrophy of thyroid (acquired): Secondary | ICD-10-CM | POA: Diagnosis not present

## 2022-02-19 DIAGNOSIS — R739 Hyperglycemia, unspecified: Secondary | ICD-10-CM | POA: Diagnosis not present

## 2022-02-19 DIAGNOSIS — Z23 Encounter for immunization: Secondary | ICD-10-CM | POA: Diagnosis not present

## 2022-02-19 LAB — LIPID PANEL
Cholesterol: 186 mg/dL (ref 0–200)
HDL: 52.2 mg/dL (ref >39.00)
LDL Cholesterol: 105 mg/dL — ABNORMAL HIGH (ref 0–99)
NonHDL: 133.38
Total CHOL/HDL Ratio: 4
Triglycerides: 142 mg/dL (ref 0.0–149.0)
VLDL: 28.4 mg/dL (ref 0.0–40.0)

## 2022-02-19 LAB — COMPREHENSIVE METABOLIC PANEL
ALT: 25 U/L (ref 0–35)
AST: 35 U/L (ref 0–37)
Albumin: 4 g/dL (ref 3.5–5.2)
Alkaline Phosphatase: 125 U/L — ABNORMAL HIGH (ref 39–117)
BUN: 8 mg/dL (ref 6–23)
CO2: 28 mEq/L (ref 19–32)
Calcium: 9.7 mg/dL (ref 8.4–10.5)
Chloride: 98 mEq/L (ref 96–112)
Creatinine, Ser: 0.73 mg/dL (ref 0.40–1.20)
GFR: 89.23 mL/min (ref >60.00)
Glucose, Bld: 95 mg/dL (ref 70–99)
Potassium: 3.6 mEq/L (ref 3.5–5.1)
Sodium: 136 mEq/L (ref 135–145)
Total Bilirubin: 1 mg/dL (ref 0.2–1.2)
Total Protein: 7.8 g/dL (ref 6.0–8.3)

## 2022-02-19 LAB — HEMOGLOBIN A1C: Hgb A1c MFr Bld: 6.1 % (ref 4.6–6.5)

## 2022-02-19 MED ORDER — FAMOTIDINE 40 MG PO TABS: 40.0000 mg | ORAL_TABLET | Freq: Every day | ORAL | 3 refills | Status: AC

## 2022-02-19 MED ORDER — POTASSIUM CHLORIDE ER 8 MEQ PO TBCR: EXTENDED_RELEASE_TABLET | ORAL | 3 refills | Status: DC

## 2022-02-19 MED ORDER — LEVOTHYROXINE SODIUM 75 MCG PO TABS: ORAL_TABLET | ORAL | 3 refills | Status: DC

## 2022-02-19 MED ORDER — ALBUTEROL SULFATE HFA 108 (90 BASE) MCG/ACT IN AERS: 2.0000 | INHALATION_SPRAY | RESPIRATORY_TRACT | 5 refills | Status: DC | PRN

## 2022-02-19 NOTE — Progress Notes (Signed)
Subjective:  Patient ID: Heidi Good, female    DOB: 13-Dec-1960  Age: 61 y.o. MRN: [id number]  CC: Follow-up (6 mon th f/u- Flu shot)   HPI Heidi Good presents for asthma, hypothyroidism, OA C/o GERD Outpatient Medications Prior to Visit  Medication Sig Dispense Refill   AMBULATORY NON FORMULARY MEDICATION Nitroglycerine ointment 0.125 %  Apply a pea sized amount internally four times daily. Dispense 30 GM 1 refill 30 g 1   ARIPiprazole (ABILIFY) 15 MG tablet Take 1 tablet (15 mg total) by mouth daily. 90 tablet 2   clotrimazole-betamethasone (LOTRISONE) cream Apply 1 Application topically daily. 30 g 0   estradiol (ESTRACE) 1 MG tablet Take by mouth.     fluticasone (FLOVENT HFA) 110 MCG/ACT inhaler Inhale 2 puffs into the lungs 2 (two) times daily. 1 Inhaler 12   meloxicam (MOBIC) 15 MG tablet Take 1 tablet (15 mg total) by mouth daily as needed for pain. 30 tablet 2   albuterol (VENTOLIN HFA) 108 (90 Base) MCG/ACT inhaler Inhale 2 puffs into the lungs every 4 (four) hours as needed for wheezing or shortness of breath. 1 each 5   levothyroxine (SYNTHROID) 75 MCG tablet TAKE 1 TABLET(75 MCG) BY MOUTH DAILY 90 tablet 2   potassium chloride (KLOR-CON) 8 MEQ tablet TAKE 1 TABLET(8 MEQ) BY MOUTH DAILY 90 tablet 2   D2000 ULTRA STRENGTH 50 MCG (2000 UT) CAPS SMARTSIG:1 By Mouth (Patient not taking: Reported on 02/19/2022)     traMADol (ULTRAM) 50 MG tablet SMARTSIG:1 By Mouth (Patient not taking: Reported on 02/19/2022)     No facility-administered medications prior to visit.    ROS: Review of Systems  Constitutional:  Negative for activity change, appetite change, chills, fatigue and unexpected weight change.  HENT:  Negative for congestion, mouth sores and sinus pressure.   Eyes:  Negative for visual disturbance.  Respiratory:  Negative for cough and chest tightness.   Gastrointestinal:  Negative for abdominal pain and nausea.  Genitourinary:  Negative for difficulty  urinating, frequency and vaginal pain.  Musculoskeletal:  Negative for back pain and gait problem.  Skin:  Negative for pallor and rash.  Neurological:  Negative for dizziness, tremors, weakness, numbness and headaches.  Psychiatric/Behavioral:  Negative for confusion, sleep disturbance and suicidal ideas.     Objective:  BP (!) 140/82 (BP Location: Left Arm)   Pulse (!) 55   Temp 99 F (37.2 C) (Oral)   Ht 5' 3.5" (1.613 m)   Wt 191 lb 3.2 oz (86.7 kg)   SpO2 97%   BMI 33.34 kg/m   BP Readings from Last 3 Encounters:  02/19/22 (!) 140/82  11/14/21 (!) 146/82  11/07/21 140/80    Wt Readings from Last 3 Encounters:  02/19/22 191 lb 3.2 oz (86.7 kg)  11/14/21 194 lb (88 kg)  11/07/21 193 lb (87.5 kg)    Physical Exam Constitutional:      General: She is not in acute distress.    Appearance: She is well-developed. She is obese.  HENT:     Head: Normocephalic.     Right Ear: External ear normal.     Left Ear: External ear normal.     Nose: Nose normal.  Eyes:     General:        Right eye: No discharge.        Left eye: No discharge.     Conjunctiva/sclera: Conjunctivae normal.     Pupils: Pupils are equal, round, and reactive  to light.  Neck:     Thyroid: No thyromegaly.     Vascular: No JVD.     Trachea: No tracheal deviation.  Cardiovascular:     Rate and Rhythm: Normal rate and regular rhythm.     Heart sounds: Normal heart sounds.  Pulmonary:     Effort: No respiratory distress.     Breath sounds: No stridor. No wheezing.  Abdominal:     General: Bowel sounds are normal. There is no distension.     Palpations: Abdomen is soft. There is no mass.     Tenderness: There is no abdominal tenderness. There is no guarding or rebound.  Musculoskeletal:        General: No tenderness.     Cervical back: Normal range of motion and neck supple. No rigidity.  Lymphadenopathy:     Cervical: No cervical adenopathy.  Skin:    Findings: No erythema or rash.   Neurological:     Mental Status: She is oriented to person, place, and time.     Cranial Nerves: No cranial nerve deficit.     Motor: No abnormal muscle tone.     Coordination: Coordination normal.     Deep Tendon Reflexes: Reflexes normal.  Psychiatric:        Behavior: Behavior normal.        Thought Content: Thought content normal.        Judgment: Judgment normal.     Lab Results  Component Value Date   WBC 10.7 (H) 03/07/2020   HGB 15.6 (H) 03/07/2020   HCT 45.8 03/07/2020   PLT 184.0 03/07/2020   GLUCOSE 92 03/06/2021   CHOL 185 03/07/2020   TRIG 241.0 (H) 03/07/2020   HDL 40.90 03/07/2020   LDLDIRECT 52.0 03/07/2020   LDLCALC 55 03/10/2015   ALT 27 03/06/2021   AST 39 (H) 03/06/2021   NA 136 03/06/2021   K 3.8 03/06/2021   CL 98 03/06/2021   CREATININE 0.70 03/06/2021   BUN 7 03/06/2021   CO2 30 03/06/2021   TSH 0.58 03/06/2021   HGBA1C 5.9 03/06/2021    MM 3D SCREEN BREAST BILATERAL  Result Date: 11/29/2021 CLINICAL DATA:  Screening. EXAM: DIGITAL SCREENING BILATERAL MAMMOGRAM WITH TOMOSYNTHESIS AND CAD TECHNIQUE: Bilateral screening digital craniocaudal and mediolateral oblique mammograms were obtained. Bilateral screening digital breast tomosynthesis was performed. The images were evaluated with computer-aided detection. COMPARISON:  Previous exam(s). ACR Breast Density Category c: The breast tissue is heterogeneously dense, which may obscure small masses. FINDINGS: There are no findings suspicious for malignancy. IMPRESSION: No mammographic evidence of malignancy. A result letter of this screening mammogram will be mailed directly to the patient. RECOMMENDATION: Screening mammogram in one year. (Code:SM-B-01Y) BI-RADS CATEGORY  1: Negative. Electronically Signed   By: Zerita Boers M.D.   On: 11/29/2021 16:30    Assessment & Plan:   Problem List Items Addressed This Visit     Asthma, mild intermittent    Proventil prn - new Rx      Relevant Medications    albuterol (VENTOLIN HFA) 108 (90 Base) MCG/ACT inhaler   GERD (gastroesophageal reflux disease)    Worse Start Pepcid po      Relevant Medications   famotidine (PEPCID) 40 MG tablet   Hypothyroidism    Cont w/Levothroid Check TSH      Relevant Medications   levothyroxine (SYNTHROID) 75 MCG tablet   Prediabetes    Check A1c         Meds ordered this  encounter  Medications   albuterol (VENTOLIN HFA) 108 (90 Base) MCG/ACT inhaler    Sig: Inhale 2 puffs into the lungs every 4 (four) hours as needed for wheezing or shortness of breath.    Dispense:  8 g    Refill:  5   levothyroxine (SYNTHROID) 75 MCG tablet    Sig: TAKE 1 TABLET(75 MCG) BY MOUTH DAILY    Dispense:  90 tablet    Refill:  3   potassium chloride (KLOR-CON) 8 MEQ tablet    Sig: TAKE 1 TABLET(8 MEQ) BY MOUTH DAILY    Dispense:  90 tablet    Refill:  3   famotidine (PEPCID) 40 MG tablet    Sig: Take 1 tablet (40 mg total) by mouth daily.    Dispense:  90 tablet    Refill:  3      Follow-up: Return in about 6 months (around 08/21/2022) for a follow-up visit.  Walker Kehr, MD

## 2022-02-19 NOTE — Assessment & Plan Note (Signed)
Cont w/Levothroid Check TSH

## 2022-02-19 NOTE — Patient Instructions (Signed)
Heidi Good , Thank you for taking time to come for your Medicare Wellness Visit. I appreciate your ongoing commitment to your health goals. Please review the following plan we discussed and let me know if I can assist you in the future.   These are the goals we discussed:  Goals      MY GOAL IS TO INCREASE MY WALKING AND CONTINUE TO EXERCISE DURING THE WINTER MONTHS.        This is a list of the screening recommended for you and due dates:  Health Maintenance  Topic Date Due   Complete foot exam   Never done   Eye exam for diabetics  Never done   HIV Screening  Never done   Hepatitis C Screening: USPSTF Recommendation to screen - Ages 53-79 yo.  Never done   Zoster (Shingles) Vaccine (1 of 2) Never done   Yearly kidney health urinalysis for diabetes  11/18/2014   COVID-19 Vaccine (3 - Pfizer series) 11/08/2019   Hemoglobin A1C  09/04/2021   Yearly kidney function blood test for diabetes  03/06/2022   Mammogram  11/29/2023   Tetanus Vaccine  03/07/2025   Colon Cancer Screening  06/18/2026   Flu Shot  Completed   HPV Vaccine  Aged Out    Advanced directives: No  Conditions/risks identified: Yes  Next appointment: Follow up in one year for your annual wellness visit.   Preventive Care 40-64 Years, Female Preventive care refers to lifestyle choices and visits with your health care provider that can promote health and wellness. What does preventive care include? A yearly physical exam. This is also called an annual well check. Dental exams once or twice a year. Routine eye exams. Ask your health care provider how often you should have your eyes checked. Personal lifestyle choices, including: Daily care of your teeth and gums. Regular physical activity. Eating a healthy diet. Avoiding tobacco and drug use. Limiting alcohol use. Practicing safe sex. Taking low-dose aspirin daily starting at age 92. Taking vitamin and mineral supplements as recommended by your health care  provider. What happens during an annual well check? The services and screenings done by your health care provider during your annual well check will depend on your age, overall health, lifestyle risk factors, and family history of disease. Counseling  Your health care provider may ask you questions about your: Alcohol use. Tobacco use. Drug use. Emotional well-being. Home and relationship well-being. Sexual activity. Eating habits. Work and work Statistician. Method of birth control. Menstrual cycle. Pregnancy history. Screening  You may have the following tests or measurements: Height, weight, and BMI. Blood pressure. Lipid and cholesterol levels. These may be checked every 5 years, or more frequently if you are over 51 years old. Skin check. Lung cancer screening. You may have this screening every year starting at age 75 if you have a 30-pack-year history of smoking and currently smoke or have quit within the past 15 years. Fecal occult blood test (FOBT) of the stool. You may have this test every year starting at age 13. Flexible sigmoidoscopy or colonoscopy. You may have a sigmoidoscopy every 5 years or a colonoscopy every 10 years starting at age 71. Hepatitis C blood test. Hepatitis B blood test. Sexually transmitted disease (STD) testing. Diabetes screening. This is done by checking your blood sugar (glucose) after you have not eaten for a while (fasting). You may have this done every 1-3 years. Mammogram. This may be done every 1-2 years. Talk to your health  care provider about when you should start having regular mammograms. This may depend on whether you have a family history of breast cancer. BRCA-related cancer screening. This may be done if you have a family history of breast, ovarian, tubal, or peritoneal cancers. Pelvic exam and Pap test. This may be done every 3 years starting at age 17. Starting at age 13, this may be done every 5 years if you have a Pap test in  combination with an HPV test. Bone density scan. This is done to screen for osteoporosis. You may have this scan if you are at high risk for osteoporosis. Discuss your test results, treatment options, and if necessary, the need for more tests with your health care provider. Vaccines  Your health care provider may recommend certain vaccines, such as: Influenza vaccine. This is recommended every year. Tetanus, diphtheria, and acellular pertussis (Tdap, Td) vaccine. You may need a Td booster every 10 years. Zoster vaccine. You may need this after age 26. Pneumococcal 13-valent conjugate (PCV13) vaccine. You may need this if you have certain conditions and were not previously vaccinated. Pneumococcal polysaccharide (PPSV23) vaccine. You may need one or two doses if you smoke cigarettes or if you have certain conditions. Talk to your health care provider about which screenings and vaccines you need and how often you need them. This information is not intended to replace advice given to you by your health care provider. Make sure you discuss any questions you have with your health care provider. Document Released: 05/26/2015 Document Revised: 01/17/2016 Document Reviewed: 02/28/2015 Elsevier Interactive Patient Education  2017 [place] Prevention in the Home Falls can cause injuries. They can happen to people of all ages. There are many things you can do to make your home safe and to help prevent falls. What can I do on the outside of my home? Regularly fix the edges of walkways and driveways and fix any cracks. Remove anything that might make you trip as you walk through a door, such as a raised step or threshold. Trim any bushes or trees on the path to your home. Use bright outdoor lighting. Clear any walking paths of anything that might make someone trip, such as rocks or tools. Regularly check to see if handrails are loose or broken. Make sure that both sides of any steps have  handrails. Any raised decks and porches should have guardrails on the edges. Have any leaves, snow, or ice cleared regularly. Use sand or salt on walking paths during winter. Clean up any spills in your garage right away. This includes oil or grease spills. What can I do in the bathroom? Use night lights. Install grab bars by the toilet and in the tub and shower. Do not use towel bars as grab bars. Use non-skid mats or decals in the tub or shower. If you need to sit down in the shower, use a plastic, non-slip stool. Keep the floor dry. Clean up any water that spills on the floor as soon as it happens. Remove soap buildup in the tub or shower regularly. Attach bath mats securely with double-sided non-slip rug tape. Do not have throw rugs and other things on the floor that can make you trip. What can I do in the bedroom? Use night lights. Make sure that you have a light by your bed that is easy to reach. Do not use any sheets or blankets that are too big for your bed. They should not hang down onto  the floor. Have a firm chair that has side arms. You can use this for support while you get dressed. Do not have throw rugs and other things on the floor that can make you trip. What can I do in the kitchen? Clean up any spills right away. Avoid walking on wet floors. Keep items that you use a lot in easy-to-reach places. If you need to reach something above you, use a strong step stool that has a grab bar. Keep electrical cords out of the way. Do not use floor polish or wax that makes floors slippery. If you must use wax, use non-skid floor wax. Do not have throw rugs and other things on the floor that can make you trip. What can I do with my stairs? Do not leave any items on the stairs. Make sure that there are handrails on both sides of the stairs and use them. Fix handrails that are broken or loose. Make sure that handrails are as long as the stairways. Check any carpeting to make sure  that it is firmly attached to the stairs. Fix any carpet that is loose or worn. Avoid having throw rugs at the top or bottom of the stairs. If you do have throw rugs, attach them to the floor with carpet tape. Make sure that you have a light switch at the top of the stairs and the bottom of the stairs. If you do not have them, ask someone to add them for you. What else can I do to help prevent falls? Wear shoes that: Do not have high heels. Have rubber bottoms. Are comfortable and fit you well. Are closed at the toe. Do not wear sandals. If you use a stepladder: Make sure that it is fully opened. Do not climb a closed stepladder. Make sure that both sides of the stepladder are locked into place. Ask someone to hold it for you, if possible. Clearly mark and make sure that you can see: Any grab bars or handrails. First and last steps. Where the edge of each step is. Use tools that help you move around (mobility aids) if they are needed. These include: Canes. Walkers. Scooters. Crutches. Turn on the lights when you go into a dark area. Replace any light bulbs as soon as they burn out. Set up your furniture so you have a clear path. Avoid moving your furniture around. If any of your floors are uneven, fix them. If there are any pets around you, be aware of where they are. Review your medicines with your doctor. Some medicines can make you feel dizzy. This can increase your chance of falling. Ask your doctor what other things that you can do to help prevent falls. This information is not intended to replace advice given to you by your health care provider. Make sure you discuss any questions you have with your health care provider. Document Released: 02/23/2009 Document Revised: 10/05/2015 Document Reviewed: 06/03/2014 Elsevier Interactive Patient Education  2017 [employer].

## 2022-02-19 NOTE — Assessment & Plan Note (Signed)
Proventil prn - new Rx

## 2022-02-19 NOTE — Progress Notes (Addendum)
Subjective:   Heidi Good is a 61 y.o. female who presents for an Initial Medicare Annual Wellness Visit.  Review of Systems     Cardiac Risk Factors include: advanced age (>22men, >59 women);dyslipidemia;family history of premature cardiovascular disease;obesity (BMI >30kg/m2)     Objective:    Today's Vitals   02/19/22 1440  BP: (!) 140/82  Pulse: (!) 55  Temp: 99 F (37.2 C)  SpO2: 97%  Weight: 191 lb (86.6 kg)  Height: 5' 3.5" (1.613 m)  PainSc: 0-No pain   Body mass index is 33.3 kg/m.     02/19/2022    2:47 PM 03/09/2018   12:02 PM 03/20/2017    2:59 PM 09/24/2016    8:22 PM 06/18/2016    1:59 PM  Advanced Directives  Does Patient Have a Medical Advance Directive? No No No No No  Would patient like information on creating a medical advance directive? No - Patient declined No - Patient declined No - Patient declined No - Patient declined     Current Medications (verified) Outpatient Encounter Medications as of 02/19/2022  Medication Sig   albuterol (VENTOLIN HFA) 108 (90 Base) MCG/ACT inhaler Inhale 2 puffs into the lungs every 4 (four) hours as needed for wheezing or shortness of breath.   AMBULATORY NON FORMULARY MEDICATION Nitroglycerine ointment 0.125 %  Apply a pea sized amount internally four times daily. Dispense 30 GM 1 refill   ARIPiprazole (ABILIFY) 15 MG tablet Take 1 tablet (15 mg total) by mouth daily.   clotrimazole-betamethasone (LOTRISONE) cream Apply 1 Application topically daily.   estradiol (ESTRACE) 1 MG tablet Take by mouth.   famotidine (PEPCID) 40 MG tablet Take 1 tablet (40 mg total) by mouth daily.   fluticasone (FLOVENT HFA) 110 MCG/ACT inhaler Inhale 2 puffs into the lungs 2 (two) times daily.   levothyroxine (SYNTHROID) 75 MCG tablet TAKE 1 TABLET(75 MCG) BY MOUTH DAILY   meloxicam (MOBIC) 15 MG tablet Take 1 tablet (15 mg total) by mouth daily as needed for pain.   potassium chloride (KLOR-CON) 8 MEQ tablet TAKE 1 TABLET(8 MEQ)  BY MOUTH DAILY   [DISCONTINUED] albuterol (VENTOLIN HFA) 108 (90 Base) MCG/ACT inhaler Inhale 2 puffs into the lungs every 4 (four) hours as needed for wheezing or shortness of breath.   [DISCONTINUED] D2000 ULTRA STRENGTH 50 MCG (2000 UT) CAPS SMARTSIG:1 By Mouth (Patient not taking: Reported on 02/19/2022)   [DISCONTINUED] levothyroxine (SYNTHROID) 75 MCG tablet TAKE 1 TABLET(75 MCG) BY MOUTH DAILY   [DISCONTINUED] potassium chloride (KLOR-CON) 8 MEQ tablet TAKE 1 TABLET(8 MEQ) BY MOUTH DAILY   [DISCONTINUED] traMADol (ULTRAM) 50 MG tablet SMARTSIG:1 By Mouth (Patient not taking: Reported on 02/19/2022)   No facility-administered encounter medications on file as of 02/19/2022.    Allergies (verified) Doxycycline, Hydrocodone, and Other   History: Past Medical History:  Diagnosis Date   Anxiety    Constipation    Deafness    Depression    Diabetes mellitus ([place])    Gastroparesis    Hyperlipemia    Hypothyroidism    Past Surgical History:  Procedure Laterality Date   ABDOMINAL HYSTERECTOMY     ANAL RECTAL MANOMETRY N/A 03/07/2017   Procedure: ANO RECTAL MANOMETRY;  Surgeon: Mauri Pole, MD;  Location: [facility];  Service: Endoscopy;  Laterality: N/A;   BUNIONECTOMY Left    Family History  Problem Relation Age of Onset   Diabetes Mother    Colon polyps Mother  over 150 polyps detected   Heart disease Father    Multiple myeloma Father    Emphysema Father    Heart attack Father    Liver cancer Maternal Grandmother        not primary source, spread to her liver   Heart attack Maternal Grandfather    Emphysema Paternal Grandfather    Colon cancer Neg Hx    Esophageal cancer Neg Hx    Rectal cancer Neg Hx    Stomach cancer Neg Hx    Social History   Socioeconomic History   Marital status: Divorced    Spouse name: Not on file   Number of children: 0   Years of education: Not on file   Highest education level: Not on file  Occupational History    Occupation: disablied    Employer: RETIRED  Tobacco Use   Smoking status: Never   Smokeless tobacco: Never  Vaping Use   Vaping Use: Never used  Substance and Sexual Activity   Alcohol use: Never   Drug use: Never   Sexual activity: Not on file  Other Topics Concern   Not on file  Social History Narrative   Not on file   Social Determinants of Health   Financial Resource Strain: Low Risk  (02/19/2022)   Overall Financial Resource Strain (CARDIA)    Difficulty of Paying Living Expenses: Not hard at all  Food Insecurity: No Food Insecurity (02/19/2022)   Hunger Vital Sign    Worried About Running Out of Food in the Last Year: Never true    Ran Out of Food in the Last Year: Never true  Transportation Needs: No Transportation Needs (02/19/2022)   PRAPARE - Hydrologist (Medical): No    Lack of Transportation (Non-Medical): No  Physical Activity: Sufficiently Active (02/19/2022)   Exercise Vital Sign    Days of Exercise per Week: 5 days    Minutes of Exercise per Session: 30 min  Stress: No Stress Concern Present (02/19/2022)   [place]    Feeling of Stress : Not at all  Social Connections: Moderately Integrated (02/19/2022)   Social Connection and Isolation Panel [NHANES]    Frequency of Communication with Friends and Family: More than three times a week    Frequency of Social Gatherings with Friends and Family: More than three times a week    Attends Religious Services: More than 4 times per year    Active Member of [employer] or Organizations: Yes    Attends Music therapist: More than 4 times per year    Marital Status: Divorced    Tobacco Counseling Counseling given: Not Answered   Clinical Intake:  Pre-visit preparation completed: Yes  Pain : No/denies pain Pain Score: 0-No pain     BMI - recorded: 33.34 Nutritional Status: BMI > 30  Obese Nutritional  Risks: None Diabetes: No  How often do you need to have someone help you when you read instructions, pamphlets, or other written materials from your doctor or pharmacy?: 1 - Never What is the last grade level you completed in school?: HSG; 1 [place] ([place])  Diabetic? no  Interpreter Needed?: No  Information entered by :: Lisette Abu, LPN.   Activities of Daily Living    02/19/2022    2:48 PM  In your present state of health, do you have any difficulty performing the following activities:  Hearing? 1  Vision? 0  Difficulty concentrating or making decisions? 0  Walking or climbing stairs? 0  Dressing or bathing? 0  Doing errands, shopping? 0  Preparing Food and eating ? N  Using the Toilet? N  In the past six months, have you accidently leaked urine? N  Do you have problems with loss of bowel control? N  Managing your Medications? N  Managing your Finances? N  Housekeeping or managing your Housekeeping? N    Patient Care Team: Plotnikov, Evie Lacks, MD as PCP - General (Internal Medicine) [name], MD as Consulting Physician (Pulmonary Disease) Lorretta Harp, MD as Consulting Physician (Cardiology) Cottle, Billey Co., MD as Attending Physician (Psychiatry) [facility] Warden Fillers, MD as Consulting Physician (Ophthalmology)  Indicate any recent Medical Services you may have received from other than [facility] providers in the past year (date may be approximate).     Assessment:   This is a routine wellness examination for [place].  Hearing/Vision screen Hearing Screening - Comments:: Patient is deaf. Vision Screening - Comments:: Wears rx glasses - up to date with routine eye exams with Warden Fillers, MD.   Dietary issues and exercise activities discussed: Current Exercise Habits: Home exercise routine, Time (Minutes): 30, Frequency (Times/Week): 5, Weekly Exercise (Minutes/Week): 150, Intensity: Moderate, Exercise limited  by: respiratory conditions(s)   Goals Addressed             This Visit's Progress    MY GOAL IS TO INCREASE MY WALKING AND CONTINUE TO EXERCISE DURING THE WINTER MONTHS.        Depression Screen    02/19/2022    1:32 PM 08/20/2021    1:33 PM 02/02/2018    3:48 PM 09/04/2016    1:08 PM  PHQ 2/9 Scores  PHQ - 2 Score 0 0    PHQ- 9 Score 0 0    Exception Documentation   Medical reason Medical reason    Fall Risk    02/19/2022    2:31 PM 08/20/2021    1:33 PM 03/06/2021    1:24 PM 09/04/2016    1:08 PM  [place] in the past year? 0 0 0 Yes  Number falls in past yr: 0 0 0 1  Injury with Fall? 0 0 0 Yes  Risk for fall due to : No Fall Risks No Fall Risks No Fall Risks   Follow up Falls prevention discussed Falls evaluation completed      FALL RISK PREVENTION PERTAINING TO THE HOME:  Any stairs in or around the home? Yes  If so, are there any without handrails? No  Home free of loose throw rugs in walkways, pet beds, electrical cords, etc? Yes  Adequate lighting in your home to reduce risk of falls? Yes   ASSISTIVE DEVICES UTILIZED TO PREVENT FALLS:  Life alert? No  Use of a cane, walker or w/c? No  Grab bars in the bathroom? No  Shower chair or bench in shower? No  Elevated toilet seat or a handicapped toilet? No   TIMED UP AND GO:  Was the test performed? Yes .  Length of time to ambulate 10 feet: 6 sec.   Gait steady and fast without use of assistive device  Cognitive Function:        02/19/2022    2:49 PM  6CIT Screen  What Year? 0 points  What month? 0 points  What time? 0 points  Count back from 20 0 points  Months in reverse 0  points  Repeat phrase 0 points  Total Score 0 points    Immunizations Immunization History  Administered Date(s) Administered   Influenza Split 02/11/2011, 02/24/2012   Influenza,inj,Quad PF,6+ Mos 02/16/2013, 01/26/2014, 01/25/2015, 01/16/2016, 02/03/2017, 02/02/2018, 01/14/2019, 01/21/2020, 01/16/2021,  02/19/2022   PFIZER(Purple Top)SARS-COV-2 Vaccination 08/20/2019, 09/13/2019   PNEUMOCOCCAL CONJUGATE-20 04/18/2021   Pneumococcal Conjugate-13 03/06/2016   Tdap 03/08/2015    TDAP status: Up to date  Flu Vaccine status: Up to date  Pneumococcal vaccine status: Up to date  Covid-19 vaccine status: Completed vaccines  Qualifies for Shingles Vaccine? Yes   Zostavax completed Yes   Shingrix Completed?: No.    Education has been provided regarding the importance of this vaccine. Patient has been advised to call insurance company to determine out of pocket expense if they have not yet received this vaccine. Advised may also receive vaccine at local pharmacy or Health Dept. Verbalized acceptance and understanding.  Screening Tests Health Maintenance  Topic Date Due   FOOT EXAM  Never done   OPHTHALMOLOGY EXAM  Never done   HIV Screening  Never done   Hepatitis C Screening  Never done   Zoster Vaccines- Shingrix (1 of 2) Never done   Diabetic kidney evaluation - Urine ACR  11/18/2014   COVID-19 Vaccine (3 - Pfizer series) 11/08/2019   HEMOGLOBIN A1C  09/04/2021   Diabetic kidney evaluation - GFR measurement  03/06/2022   MAMMOGRAM  11/29/2023   TETANUS/TDAP  03/07/2025   COLONOSCOPY (Pts 45-35yrs Insurance coverage will need to be confirmed)  06/18/2026   INFLUENZA VACCINE  Completed   HPV VACCINES  Aged Out    Health Maintenance  Health Maintenance Due  Topic Date Due   FOOT EXAM  Never done   OPHTHALMOLOGY EXAM  Never done   HIV Screening  Never done   Hepatitis C Screening  Never done   Zoster Vaccines- Shingrix (1 of 2) Never done   Diabetic kidney evaluation - Urine ACR  11/18/2014   COVID-19 Vaccine (3 - Pfizer series) 11/08/2019   HEMOGLOBIN A1C  09/04/2021   Diabetic kidney evaluation - GFR measurement  03/06/2022    Colorectal cancer screening: Type of screening: Colonoscopy. Completed 06/18/2016. Repeat every 10 years  Mammogram status: Completed 11/28/2021.  Repeat every year  Bone Density status: never done  Lung Cancer Screening: (Low Dose CT Chest recommended if Age 57-80 years, 30 pack-year currently smoking OR have quit w/in 15years.) does not qualify.   Lung Cancer Screening Referral: no  Additional Screening:  Hepatitis C Screening: does qualify; Completed no  Vision Screening: Recommended annual ophthalmology exams for early detection of glaucoma and other disorders of the eye. Is the patient up to date with their annual eye exam?  Yes  Who is the provider or what is the name of the office in which the patient attends annual eye exams? Warden Fillers, MD. If pt is not established with a provider, would they like to be referred to a provider to establish care? No .   Dental Screening: Recommended annual dental exams for proper oral hygiene  Community Resource Referral / Chronic Care Management: CRR required this visit?  No   CCM required this visit?  No      Plan:     I have personally reviewed and noted the following in the patient's chart:   Medical and social history Use of alcohol, tobacco or illicit drugs  Current medications and supplements including opioid prescriptions. Patient is not currently taking opioid prescriptions.  Functional ability and status Nutritional status Physical activity Advanced directives List of other physicians Hospitalizations, surgeries, and ER visits in previous 12 months Vitals Screenings to include cognitive, depression, and falls Referrals and appointments  In addition, I have reviewed and discussed with patient certain preventive protocols, quality metrics, and best practice recommendations. A written personalized care plan for preventive services as well as general preventive health recommendations were provided to patient.     Sheral Flow, LPN   [id number]/23/7023   Nurse Notes: N/A  Medical screening examination/treatment/procedure(s) were performed by non-physician  practitioner and as supervising physician I was immediately available for consultation/collaboration.  I agree with above. Lew Dawes, MD

## 2022-02-19 NOTE — Assessment & Plan Note (Signed)
Check A1c. 

## 2022-02-19 NOTE — Assessment & Plan Note (Signed)
Worse Start Pepcid po

## 2022-02-20 LAB — TSH: TSH: 0.93 u[IU]/mL (ref 0.35–5.50)

## 2022-04-15 ENCOUNTER — Other Ambulatory Visit: Payer: Self-pay | Admitting: Psychiatry

## 2022-04-15 DIAGNOSIS — F200 Paranoid schizophrenia: Secondary | ICD-10-CM

## 2022-07-03 ENCOUNTER — Ambulatory Visit (INDEPENDENT_AMBULATORY_CARE_PROVIDER_SITE_OTHER): Payer: Medicare Other | Admitting: Podiatry

## 2022-07-03 DIAGNOSIS — M2042 Other hammer toe(s) (acquired), left foot: Secondary | ICD-10-CM

## 2022-07-03 DIAGNOSIS — L84 Corns and callosities: Secondary | ICD-10-CM

## 2022-07-03 NOTE — Patient Instructions (Signed)
Look for urea 40% cream or ointment and apply to the thickened dry skin / calluses. This can be bought over the counter, at a pharmacy or online such as [employer].   Wear the toe cap and crest to keep pressure off of it     If this does not help then a procedure to release the tendon may be helpful

## 2022-07-04 NOTE — Progress Notes (Signed)
  Subjective:  Patient ID: Heidi Good, female    DOB: Aug 20, 1960,  MRN: [id number]  Chief Complaint  Patient presents with   Hammer Toe    L foot callus removal. - Onsite Interpreter unavailable, use Video Remote Interpreter **      62 y.o. female presents with the above complaint. History confirmed with patient.  She returns for follow-up, the left fourth toe is now giving a callus and pain  Objective:  Physical Exam: warm, good capillary refill, no trophic changes or ulcerative lesions, normal DP and PT pulses, normal sensory exam, and semireducible hammertoe deformity of the fourth toe, she is a callus at the distal lateral tip  Assessment:   1. Callus of foot   2. Hammertoe of left foot      Plan:  Patient was evaluated and treated and all questions answered.  We discussed the contracture and adductovarus contracture of the fourth toe which previously was asymptomatic.  I debrided the calluses a courtesy today, the toe is reducible in nature.  I dispensed a silicone toe crest and toe cap she will try this.  If not improving could return for flexor tenotomy  No follow-ups on file.

## 2022-08-21 ENCOUNTER — Encounter: Payer: Self-pay | Admitting: Internal Medicine

## 2022-08-21 ENCOUNTER — Ambulatory Visit (INDEPENDENT_AMBULATORY_CARE_PROVIDER_SITE_OTHER): Payer: Medicare Other | Admitting: Internal Medicine

## 2022-08-21 VITALS — BP 130/78 | HR 103 | Temp 98.3°F | Ht 63.5 in | Wt 188.0 lb

## 2022-08-21 DIAGNOSIS — E785 Hyperlipidemia, unspecified: Secondary | ICD-10-CM | POA: Diagnosis not present

## 2022-08-21 DIAGNOSIS — R002 Palpitations: Secondary | ICD-10-CM | POA: Diagnosis not present

## 2022-08-21 DIAGNOSIS — R7303 Prediabetes: Secondary | ICD-10-CM

## 2022-08-21 DIAGNOSIS — F200 Paranoid schizophrenia: Secondary | ICD-10-CM

## 2022-08-21 DIAGNOSIS — F4321 Adjustment disorder with depressed mood: Secondary | ICD-10-CM

## 2022-08-21 DIAGNOSIS — I499 Cardiac arrhythmia, unspecified: Secondary | ICD-10-CM

## 2022-08-21 DIAGNOSIS — E034 Atrophy of thyroid (acquired): Secondary | ICD-10-CM

## 2022-08-21 DIAGNOSIS — R739 Hyperglycemia, unspecified: Secondary | ICD-10-CM

## 2022-08-21 LAB — URINALYSIS
Bilirubin Urine: NEGATIVE
Hgb urine dipstick: NEGATIVE
Ketones, ur: NEGATIVE
Leukocytes,Ua: NEGATIVE
Nitrite: NEGATIVE
Specific Gravity, Urine: <=1.005 — AB (ref 1.000–1.030)
Total Protein, Urine: NEGATIVE
Urine Glucose: NEGATIVE
Urobilinogen, UA: 0.2 (ref 0.0–1.0)
pH: 7 (ref 5.0–8.0)

## 2022-08-21 LAB — T4, FREE: Free T4: 1.34 ng/dL (ref 0.60–1.60)

## 2022-08-21 LAB — LIPID PANEL
Cholesterol: 216 mg/dL — ABNORMAL HIGH (ref 0–200)
HDL: 52.8 mg/dL (ref >39.00)
LDL Cholesterol: 129 mg/dL — ABNORMAL HIGH (ref 0–99)
NonHDL: 162.9
Total CHOL/HDL Ratio: 4
Triglycerides: 171 mg/dL — ABNORMAL HIGH (ref 0.0–149.0)
VLDL: 34.2 mg/dL (ref 0.0–40.0)

## 2022-08-21 LAB — CBC WITH DIFFERENTIAL/PLATELET
Basophils Absolute: 0.1 10*3/uL (ref 0.0–0.1)
Basophils Relative: 0.7 % (ref 0.0–3.0)
Eosinophils Absolute: 0.3 10*3/uL (ref 0.0–0.7)
Eosinophils Relative: 2.8 % (ref 0.0–5.0)
HCT: 45.7 % (ref 36.0–46.0)
Hemoglobin: 15.7 g/dL — ABNORMAL HIGH (ref 12.0–15.0)
Lymphocytes Relative: 34.9 % (ref 12.0–46.0)
Lymphs Abs: 3.6 10*3/uL (ref 0.7–4.0)
MCHC: 34.3 g/dL (ref 30.0–36.0)
MCV: 87.6 fl (ref 78.0–100.0)
Monocytes Absolute: 0.7 10*3/uL (ref 0.1–1.0)
Monocytes Relative: 7.3 % (ref 3.0–12.0)
Neutro Abs: 5.5 10*3/uL (ref 1.4–7.7)
Neutrophils Relative %: 54.3 % (ref 43.0–77.0)
Platelets: 149 10*3/uL — ABNORMAL LOW (ref 150.0–400.0)
RBC: 5.22 Mil/uL — ABNORMAL HIGH (ref 3.87–5.11)
RDW: 13.1 % (ref 11.5–15.5)
WBC: 10.2 10*3/uL (ref 4.0–10.5)

## 2022-08-21 LAB — COMPREHENSIVE METABOLIC PANEL
ALT: 25 U/L (ref 0–35)
AST: 35 U/L (ref 0–37)
Albumin: 4.1 g/dL (ref 3.5–5.2)
Alkaline Phosphatase: 122 U/L — ABNORMAL HIGH (ref 39–117)
BUN: 6 mg/dL (ref 6–23)
CO2: 30 mEq/L (ref 19–32)
Calcium: 9.7 mg/dL (ref 8.4–10.5)
Chloride: 99 mEq/L (ref 96–112)
Creatinine, Ser: 0.65 mg/dL (ref 0.40–1.20)
GFR: 95.19 mL/min (ref >60.00)
Glucose, Bld: 91 mg/dL (ref 70–99)
Potassium: 4 mEq/L (ref 3.5–5.1)
Sodium: 137 mEq/L (ref 135–145)
Total Bilirubin: 0.9 mg/dL (ref 0.2–1.2)
Total Protein: 7.6 g/dL (ref 6.0–8.3)

## 2022-08-21 LAB — TSH: TSH: 0.41 u[IU]/mL (ref 0.35–5.50)

## 2022-08-21 LAB — HEMOGLOBIN A1C: Hgb A1c MFr Bld: 5.9 % (ref 4.6–6.5)

## 2022-08-21 NOTE — Assessment & Plan Note (Signed)
Check A1c. 

## 2022-08-21 NOTE — Progress Notes (Signed)
Subjective:  Patient ID: Heidi Good, female    DOB: 1960-06-10  Age: 62 y.o. MRN: [id number]  CC: Follow-up (6 MNTH F/U)   HPI Heidi Good presents for hypothyroidism, anxiety, OA  Outpatient Medications Prior to Visit  Medication Sig Dispense Refill   albuterol (VENTOLIN HFA) 108 (90 Base) MCG/ACT inhaler Inhale 2 puffs into the lungs every 4 (four) hours as needed for wheezing or shortness of breath. 8 g 5   ALPRAZolam (XANAX) 0.5 MG tablet      AMBULATORY NON FORMULARY MEDICATION Nitroglycerine ointment 0.125 %  Apply a pea sized amount internally four times daily. Dispense 30 GM 1 refill 30 g 1   ARIPiprazole (ABILIFY) 15 MG tablet TAKE 1 TABLET(15 MG) BY MOUTH DAILY 90 tablet 2   clotrimazole-betamethasone (LOTRISONE) cream Apply 1 Application topically daily. 30 g 0   estradiol (ESTRACE) 1 MG tablet Take by mouth.     famotidine (PEPCID) 40 MG tablet Take 1 tablet (40 mg total) by mouth daily. 90 tablet 3   fluticasone (FLOVENT HFA) 110 MCG/ACT inhaler Inhale 2 puffs into the lungs 2 (two) times daily. 1 Inhaler 12   levothyroxine (SYNTHROID) 75 MCG tablet TAKE 1 TABLET(75 MCG) BY MOUTH DAILY 90 tablet 3   meloxicam (MOBIC) 15 MG tablet Take 1 tablet (15 mg total) by mouth daily as needed for pain. 30 tablet 2   potassium chloride (KLOR-CON) 8 MEQ tablet TAKE 1 TABLET(8 MEQ) BY MOUTH DAILY 90 tablet 3   No facility-administered medications prior to visit.    ROS: Review of Systems  Constitutional:  Negative for activity change, appetite change, chills, fatigue and unexpected weight change.  HENT:  Negative for congestion, mouth sores and sinus pressure.   Eyes:  Negative for visual disturbance.  Respiratory:  Negative for cough and chest tightness.   Gastrointestinal:  Negative for abdominal pain and nausea.  Genitourinary:  Negative for difficulty urinating, frequency and vaginal pain.  Musculoskeletal:  Positive for arthralgias. Negative for back pain and gait  problem.  Skin:  Negative for pallor and rash.  Neurological:  Negative for dizziness, tremors, weakness, numbness and headaches.  Psychiatric/Behavioral:  Negative for confusion, sleep disturbance and suicidal ideas.     Objective:  BP 130/78 (BP Location: Left Arm, Patient Position: Sitting, Cuff Size: Large)   Pulse (!) 103   Temp 98.3 F (36.8 C) (Oral)   Ht 5' 3.5" (1.613 m)   Wt 188 lb (85.3 kg)   SpO2 98%   BMI 32.78 kg/m   BP Readings from Last 3 Encounters:  08/21/22 130/78  02/19/22 (!) 140/82  02/19/22 (!) 140/82    Wt Readings from Last 3 Encounters:  08/21/22 188 lb (85.3 kg)  02/19/22 191 lb (86.6 kg)  02/19/22 191 lb 3.2 oz (86.7 kg)    Physical Exam Constitutional:      General: She is not in acute distress.    Appearance: She is well-developed. She is obese.  HENT:     Head: Normocephalic.     Right Ear: External ear normal.     Left Ear: External ear normal.     Nose: Nose normal.  Eyes:     General:        Right eye: No discharge.        Left eye: No discharge.     Conjunctiva/sclera: Conjunctivae normal.     Pupils: Pupils are equal, round, and reactive to light.  Neck:     Thyroid: No  thyromegaly.     Vascular: No JVD.     Trachea: No tracheal deviation.  Cardiovascular:     Rate and Rhythm: Normal rate. Rhythm irregular.     Heart sounds: Normal heart sounds.  Pulmonary:     Effort: No respiratory distress.     Breath sounds: No stridor. No wheezing.  Abdominal:     General: Bowel sounds are normal. There is no distension.     Palpations: Abdomen is soft. There is no mass.     Tenderness: There is no abdominal tenderness. There is no guarding or rebound.  Musculoskeletal:        General: No tenderness.     Cervical back: Normal range of motion and neck supple. No rigidity.  Lymphadenopathy:     Cervical: No cervical adenopathy.  Skin:    Findings: No erythema or rash.  Neurological:     Cranial Nerves: No cranial nerve deficit.      Motor: No abnormal muscle tone.     Coordination: Coordination normal.     Deep Tendon Reflexes: Reflexes normal.  Psychiatric:        Behavior: Behavior normal.        Thought Content: Thought content normal.        Judgment: Judgment normal.    Procedure: EKG Indication: irregular HR Impression: Sinus arrhythmia. PVCs. No acute changes.   Lab Results  Component Value Date   WBC 10.7 (H) 03/07/2020   HGB 15.6 (H) 03/07/2020   HCT 45.8 03/07/2020   PLT 184.0 03/07/2020   GLUCOSE 95 02/19/2022   CHOL 186 02/19/2022   TRIG 142.0 02/19/2022   HDL 52.20 02/19/2022   LDLDIRECT 52.0 03/07/2020   LDLCALC 105 (H) 02/19/2022   ALT 25 02/19/2022   AST 35 02/19/2022   NA 136 02/19/2022   K 3.6 02/19/2022   CL 98 02/19/2022   CREATININE 0.73 02/19/2022   BUN 8 02/19/2022   CO2 28 02/19/2022   TSH 0.93 02/19/2022   HGBA1C 6.1 02/19/2022    MM 3D SCREEN BREAST BILATERAL  Result Date: 11/29/2021 CLINICAL DATA:  Screening. EXAM: DIGITAL SCREENING BILATERAL MAMMOGRAM WITH TOMOSYNTHESIS AND CAD TECHNIQUE: Bilateral screening digital craniocaudal and mediolateral oblique mammograms were obtained. Bilateral screening digital breast tomosynthesis was performed. The images were evaluated with computer-aided detection. COMPARISON:  Previous exam(s). ACR Breast Density Category c: The breast tissue is heterogeneously dense, which may obscure small masses. FINDINGS: There are no findings suspicious for malignancy. IMPRESSION: No mammographic evidence of malignancy. A result letter of this screening mammogram will be mailed directly to the patient. RECOMMENDATION: Screening mammogram in one year. (Code:SM-B-01Y) BI-RADS CATEGORY  1: Negative. Electronically Signed   By: Romona Curls M.D.   On: 11/29/2021 16:30    Assessment & Plan:   Problem List Items Addressed This Visit       Endocrine   Hypothyroidism    Cont w/Levothroid Monitor TSH, FT4      Relevant Orders   TSH    Urinalysis   CBC with Differential/Platelet   Lipid panel   Comprehensive metabolic panel   T4, free   Hemoglobin A1c     Other   Grief - Primary    Mom died in 16-Aug-2022      Hyperglycemia    Check A1c      Relevant Orders   TSH   Urinalysis   CBC with Differential/Platelet   Lipid panel   Comprehensive metabolic panel   T4, free   Hemoglobin  A1c   Palpitations    Worse F/u w/Dr Allyson SabalBerry      Paranoid schizophrenia, chronic condition   Prediabetes    Check A1c      Other Visit Diagnoses     Dyslipidemia       Relevant Orders   Lipid panel   Irregular heartbeat       Relevant Orders   EKG 12-Lead   Ambulatory referral to Cardiology         No orders of the defined types were placed in this encounter.     Follow-up: Return in about 6 months (around 02/20/2023) for a follow-up visit.  Sonda PrimesAlex Temitope Griffing, MD

## 2022-08-21 NOTE — Assessment & Plan Note (Signed)
Worse F/u w/Dr Allyson Sabal

## 2022-08-21 NOTE — Assessment & Plan Note (Signed)
Cont w/Levothroid Monitor TSH, FT4

## 2022-08-21 NOTE — Assessment & Plan Note (Signed)
Mom died in 08/28/22

## 2022-09-11 NOTE — Progress Notes (Addendum)
Referring-Aleksei Plotnikov MD Reason for referral-palpitations/PVCs  HPI: 62 year old female for evaluation of palpitations/PVCs at request of Jacinta Shoe MD.  Seen by Dr. Allyson Sabal in the past but not since January 2020.  Nuclear study August 2018 showed ejection fraction 55% with no ischemia or infarction.  Noted to have PVCs in the past.  Again noted recently on electrocardiogram and cardiology asked to evaluate.  Laboratories April 2024 showed TSH 0.41, free T41.34, potassium 4.0, creatinine 0.65, LDL 129, hemoglobin 15.7.  Patient describes an occasional fluttering in her chest.  No syncope.  She denies orthopnea, PND, pedal edema or exertional chest pain.  She has dyspnea with more vigorous activities but not routine activities.  Cardiology is now asked to evaluate.  Note history is obtained with the assistance of interpreter.  Current Outpatient Medications  Medication Sig Dispense Refill   albuterol (VENTOLIN HFA) 108 (90 Base) MCG/ACT inhaler Inhale 2 puffs into the lungs every 4 (four) hours as needed for wheezing or shortness of breath. 8 g 5   AMBULATORY NON FORMULARY MEDICATION Nitroglycerine ointment 0.125 %  Apply a pea sized amount internally four times daily. Dispense 30 GM 1 refill 30 g 1   ARIPiprazole (ABILIFY) 15 MG tablet TAKE 1 TABLET(15 MG) BY MOUTH DAILY 90 tablet 2   clotrimazole-betamethasone (LOTRISONE) cream Apply 1 Application topically daily. 30 g 0   famotidine (PEPCID) 40 MG tablet Take 1 tablet (40 mg total) by mouth daily. (Patient taking differently: Take 40 mg by mouth as needed for heartburn or indigestion.) 90 tablet 3   fluticasone (FLOVENT HFA) 110 MCG/ACT inhaler Inhale 2 puffs into the lungs 2 (two) times daily. 1 Inhaler 12   levothyroxine (SYNTHROID) 75 MCG tablet TAKE 1 TABLET(75 MCG) BY MOUTH DAILY 90 tablet 3   potassium chloride (KLOR-CON) 8 MEQ tablet TAKE 1 TABLET(8 MEQ) BY MOUTH DAILY 90 tablet 3   No current facility-administered  medications for this visit.    Allergies  Allergen Reactions   Doxycycline Nausea And Vomiting   Hydrocodone     Nausea from cough syrup - ?? Very remote   Other     Dog, cat, nuts, grass, trees     Past Medical History:  Diagnosis Date   Anxiety    Constipation    Deafness    Depression    Diabetes mellitus (HCC)    Gastroparesis    Hyperlipemia    Hypertension    Hypothyroidism     Past Surgical History:  Procedure Laterality Date   ABDOMINAL HYSTERECTOMY     ANAL RECTAL MANOMETRY N/A 03/07/2017   Procedure: ANO RECTAL MANOMETRY;  Surgeon: Napoleon Form, MD;  Location: [facility];  Service: Endoscopy;  Laterality: N/A;   BUNIONECTOMY Left     Social History   Socioeconomic History   Marital status: Divorced    Spouse name: Not on file   Number of children: 0   Years of education: Not on file   Highest education level: Not on file  Occupational History   Occupation: disablied    Employer: RETIRED  Tobacco Use   Smoking status: Never   Smokeless tobacco: Never  Vaping Use   Vaping Use: Never used  Substance and Sexual Activity   Alcohol use: Never   Drug use: Never   Sexual activity: Not on file  Other Topics Concern   Not on file  Social History Narrative   Not on file   Social Determinants of Health   Financial  Resource Strain: Low Risk  (02/19/2022)   Overall Financial Resource Strain (CARDIA)    Difficulty of Paying Living Expenses: Not hard at all  Food Insecurity: No Food Insecurity (02/19/2022)   Hunger Vital Sign    Worried About Running Out of Food in the Last Year: Never true    Ran Out of Food in the Last Year: Never true  Transportation Needs: No Transportation Needs (02/19/2022)   PRAPARE - Administrator, Civil Service (Medical): No    Lack of Transportation (Non-Medical): No  Physical Activity: Sufficiently Active (02/19/2022)   Exercise Vital Sign    Days of Exercise per Week: 5 days    Minutes of Exercise  per Session: 30 min  Stress: No Stress Concern Present (02/19/2022)   [employer] of Occupational Health - Occupational Stress Questionnaire    Feeling of Stress : Not at all  Social Connections: Moderately Integrated (02/19/2022)   Social Connection and Isolation Panel [NHANES]    Frequency of Communication with Friends and Family: More than three times a week    Frequency of Social Gatherings with Friends and Family: More than three times a week    Attends Religious Services: More than 4 times per year    Active Member of [employer] or Organizations: Yes    Attends Engineer, structural: More than 4 times per year    Marital Status: Divorced  Catering manager Violence: Not At Risk (02/19/2022)   Humiliation, Afraid, Rape, and Kick questionnaire    Fear of Current or Ex-Partner: No    Emotionally Abused: No    Physically Abused: No    Sexually Abused: No    Family History  Problem Relation Age of Onset   Diabetes Mother    Colon polyps Mother        over 150 polyps detected   Heart disease Father    Multiple myeloma Father    Emphysema Father    Heart attack Father    Liver cancer Maternal Grandmother        not primary source, spread to her liver   Heart attack Maternal Grandfather    Emphysema Paternal Grandfather    Colon cancer Neg Hx    Esophageal cancer Neg Hx    Rectal cancer Neg Hx    Stomach cancer Neg Hx     ROS: no fevers or chills, productive cough, hemoptysis, dysphasia, odynophagia, melena, hematochezia, dysuria, hematuria, rash, seizure activity, orthopnea, PND, pedal edema, claudication. Remaining systems are negative.  Physical Exam:   Blood pressure (!) 172/84, pulse 88, height 5\' 5"  (1.651 m), weight 187 lb 9.6 oz (85.1 kg), SpO2 98 %.  General:  Well developed/well nourished in NAD Skin warm/dry Patient not depressed No peripheral clubbing Back-normal HEENT-normal/normal eyelids Neck supple/normal carotid upstroke bilaterally; no  bruits; no JVD; no thyromegaly chest - CTA/ normal expansion CV - RRR/normal S1 and S2; no murmurs, rubs or gallops;  PMI nondisplaced Abdomen -NT/ND, no HSM, no mass, + bowel sounds, no bruit 2+ femoral pulses, no bruits Ext-no edema, chords, 2+ DP Neuro-grossly nonfocal  ECG -August 21, 2022-normal sinus rhythm with PVCs.  Personally reviewed  Today's electrocardiogram shows normal sinus rhythm with PACs, PVCs and nonspecific ST changes.  A/P  1 palpitations/PVCs-will arrange echocardiogram to assess LV function.  Add Toprol 25 mg daily.  Note she typically does not have exertional chest pain; she does note dyspnea with more vigorous activities.  2 hyperlipidemia-will await calcium score.  If elevated will  need statin therapy.  3 diabetes mellitus-patient does have a history of diabetes mellitus though this apparently has resolved.  Will arrange calcium score for risk stratification.  4 hypertension-patient's blood pressure is elevated but it is typically controlled at home.  We are adding Toprol for palpitations.  Follow blood pressure and adjust medications as needed.  Addendum-patient declined calcium score due to expense.  Olga Millers, MD

## 2022-09-18 ENCOUNTER — Ambulatory Visit: Payer: Medicare Other | Attending: Cardiology | Admitting: Cardiology

## 2022-09-18 ENCOUNTER — Encounter: Payer: Self-pay | Admitting: Cardiology

## 2022-09-18 VITALS — BP 172/84 | HR 88 | Ht 65.0 in | Wt 187.6 lb

## 2022-09-18 DIAGNOSIS — Z136 Encounter for screening for cardiovascular disorders: Secondary | ICD-10-CM | POA: Insufficient documentation

## 2022-09-18 DIAGNOSIS — R0609 Other forms of dyspnea: Secondary | ICD-10-CM | POA: Insufficient documentation

## 2022-09-18 DIAGNOSIS — I493 Ventricular premature depolarization: Secondary | ICD-10-CM | POA: Diagnosis not present

## 2022-09-18 DIAGNOSIS — R002 Palpitations: Secondary | ICD-10-CM | POA: Diagnosis not present

## 2022-09-18 MED ORDER — METOPROLOL SUCCINATE ER 25 MG PO TB24
25.0000 mg | ORAL_TABLET | Freq: Every day | ORAL | 3 refills | Status: DC
Start: 2022-09-18 — End: 2023-07-15

## 2022-09-18 NOTE — Addendum Note (Signed)
Addended by: Dorris Fetch on: 09/18/2022 04:32 PM   Modules accepted: Orders

## 2022-09-18 NOTE — Patient Instructions (Addendum)
Medication Instructions:   START METOPROLOL SUCC ER 25 MG ONCE DAILY AT BEDTIME  *If you need a refill on your cardiac medications before your next appointment, please call your pharmacy*   Testing/Procedures:   Your physician has requested that you have an echocardiogram. Echocardiography is a painless test that uses sound waves to create images of your heart. It provides your doctor with information about the size and shape of your heart and how well your heart's chambers and valves are working. This procedure takes approximately one hour. There are no restrictions for this procedure. Please do NOT wear cologne, perfume, aftershave, or lotions (deodorant is allowed). Please arrive 15 minutes prior to your appointment time. [address]   Follow-Up: At [facility], you and your health needs are our priority.  As part of our continuing mission to provide you with exceptional heart care, we have created designated Provider Care Teams.  These Care Teams include your primary Cardiologist (physician) and Advanced Practice Providers (APPs -  Physician Assistants and Nurse Practitioners) who all work together to provide you with the care you need, when you need it.  We recommend signing up for the patient portal called "MyChart".  Sign up information is provided on this After Visit Summary.  MyChart is used to connect with patients for Virtual Visits (Telemedicine).  Patients are able to view lab/test results, encounter notes, upcoming appointments, etc.  Non-urgent messages can be sent to your provider as well.   To learn more about what you can do with MyChart, go to [email or web address].    Your next appointment:   12 month(s)  Provider:   Olga Millers MD

## 2022-09-18 NOTE — Addendum Note (Signed)
Addended by: Freddi Starr on: 09/18/2022 11:15 AM   Modules accepted: Orders

## 2022-10-18 ENCOUNTER — Telehealth: Payer: Self-pay | Admitting: Psychiatry

## 2022-10-18 NOTE — Telephone Encounter (Signed)
Please see message. Patient has been on Abilify for months. Wouldn't the SE described be potentially related to the metoprolol itself and has nothing to do with an interaction between the 2 medications?  She was started on metoprolol 5/8, 25 mg QHS.

## 2022-10-18 NOTE — Telephone Encounter (Signed)
Patient informed. She mentions that the pharmacist gave her a paper about Abilify possibly causing dementia. She will bring to her 6/18 appt.

## 2022-10-18 NOTE — Telephone Encounter (Signed)
Pt called at 1:08 through a voice interpretation line.  You must call back to the [phone].  No text.  The message is that the Abilify she is on from Dr Jennelle Human seems to be counteracting with the medicine she is on for her heart, Metoprolol Succinate, making her "woosy and out of it".  Pls call her back to discuss what she needs to do.  Next appt 6/18

## 2022-10-18 NOTE — Telephone Encounter (Signed)
I double checked and there is no interaction problem with these 2 meds.  She has had no SE with Abilify before.  The SE she has are due to metoprolol.  She might get used to it but if not needs to call the doctor who RX it.

## 2022-10-21 ENCOUNTER — Ambulatory Visit (HOSPITAL_COMMUNITY): Payer: Medicare Other | Attending: Cardiology

## 2022-10-21 DIAGNOSIS — R002 Palpitations: Secondary | ICD-10-CM | POA: Insufficient documentation

## 2022-10-21 DIAGNOSIS — R0609 Other forms of dyspnea: Secondary | ICD-10-CM | POA: Diagnosis not present

## 2022-10-21 DIAGNOSIS — I493 Ventricular premature depolarization: Secondary | ICD-10-CM | POA: Diagnosis not present

## 2022-10-21 LAB — ECHOCARDIOGRAM COMPLETE
Area-P 1/2: 3.53 cm2
S' Lateral: 3 cm

## 2022-10-21 MED ORDER — PERFLUTREN LIPID MICROSPHERE
1.0000 mL | INTRAVENOUS | Status: AC | PRN
Start: 2022-10-21 — End: 2022-10-21
  Administered 2022-10-21: 2 mL via INTRAVENOUS

## 2022-10-23 ENCOUNTER — Encounter: Payer: Self-pay | Admitting: *Deleted

## 2022-10-29 ENCOUNTER — Ambulatory Visit: Payer: Medicare Other | Admitting: Psychiatry

## 2022-11-19 ENCOUNTER — Other Ambulatory Visit: Payer: Self-pay | Admitting: Internal Medicine

## 2022-11-19 DIAGNOSIS — Z1231 Encounter for screening mammogram for malignant neoplasm of breast: Secondary | ICD-10-CM

## 2022-12-04 ENCOUNTER — Ambulatory Visit
Admission: RE | Admit: 2022-12-04 | Discharge: 2022-12-04 | Disposition: A | Discharge status: Home / Self Care | Payer: Medicare Other | Source: Ambulatory Visit | Attending: Internal Medicine | Admitting: Internal Medicine

## 2022-12-04 DIAGNOSIS — Z1231 Encounter for screening mammogram for malignant neoplasm of breast: Secondary | ICD-10-CM | POA: Diagnosis not present

## 2022-12-26 ENCOUNTER — Encounter: Payer: Self-pay | Admitting: Psychiatry

## 2022-12-26 ENCOUNTER — Ambulatory Visit (INDEPENDENT_AMBULATORY_CARE_PROVIDER_SITE_OTHER): Payer: Medicare Other | Admitting: Psychiatry

## 2022-12-26 DIAGNOSIS — F5105 Insomnia due to other mental disorder: Secondary | ICD-10-CM

## 2022-12-26 DIAGNOSIS — F200 Paranoid schizophrenia: Secondary | ICD-10-CM

## 2022-12-26 DIAGNOSIS — F99 Mental disorder, not otherwise specified: Secondary | ICD-10-CM

## 2022-12-26 MED ORDER — ARIPIPRAZOLE 15 MG PO TABS
15.0000 mg | ORAL_TABLET | Freq: Every day | ORAL | 2 refills | Status: DC
Start: 2022-12-26 — End: 2023-09-24

## 2022-12-26 NOTE — Progress Notes (Signed)
Heidi Good [id number] 1961-04-17 62 y.o.  Subjective:   Patient ID:  Heidi Good is a 62 y.o. (DOB [birth year]) female.  Chief Complaint:  Chief Complaint  Patient presents with   Follow-up     Depression        Associated symptoms include no decreased concentration and no suicidal ideas.  patient is deaf and seen with an interpreter.  She has notes. Heidi Good presents to the office today for follow-up of schizophrenia.  seen November, 2020.  She continues on Abilify 15 mg daily and Xanax is the only psychiatric meds and no meds were changed.  Patient seen today the with the assistance of a translator due to her deafness.  Stress M declining health and she feels more nervous and worried.  Helps mother.  Takes alprazolam occ with little benefit.  Sleep is affected by care of mother.     M gets confused and dx dementia and sleeping a lot. B comes and helps.  She pays the bills and not the mother.    No longer has funny feelings and doesn't need the Xanax.  Denies feeling afraid at home.  B will move in when M passes away.  Has several dogs.  [place] Shepherds make her feel safer.  Abilify 15 mg increase helped her.     In November bothered bc could see something and feel something walk by outside and dog acted funny.  Then brother came over and checked things.  She felt someone was walking around outside.  Says this happened 7 times by mid DEC.  She feels in danger from this incidents.  Never saw anyone but believed there was someone there.  Police came by and didn't find anything.  Thinks it's an old friend.  M having problems with dementia.  Pt takes care of her.  Pt helps her with meds and cooks for her and helps bathe hers.  Rare Xanax is less helpful.  Patient reports stable mood and denies depressed or irritable moods.  Patient denies any recent difficulty with anxiety except as noted. No fear.  Patient denies difficulty with sleep initiation or maintenance. Denies  appetite disturbance.  Patient reports that energy and motivation have been good.  Patient denies any difficulty with concentration.  Patient denies any suicidal ideation.  15 mg Abilify helped her feel better, calmer than did the 10 mg Abilify.  No SE and claims compliance.  Denies voices, depression, anger on this dosage.  Sleep good 8 hours.  No drowsiness. Watches news.  05/24/20 appt noted: seen with ASL interpreter M in Hospice with poor health.  Not depressed but sad.  Not anxious.   B is helping some.   Mother's sister Heidi Good is helping.  F died 9 years ago.  Loves Mother.  Will live alone in the house after her mother dies. Patient reports stable mood and denies depressed or irritable moods.  Patient denies any recent difficulty with anxiety.  Patient denies difficulty with sleep initiation or maintenance. Denies appetite disturbance.  Patient reports that energy and motivation have been good.  Patient denies any difficulty with concentration.  Patient denies any suicidal ideation. No fear. Plan: No med changes  09/20/2020 appointment with the following noted: Seen with interpreter Healthy no Covid. M died. Going through process of grief.  Misses mother.  B helping a lot. No fearful or unusual anxiety.  2 dogs. I feel at peace.  No excess crying.  Sleep is good.  No problems  with Abilify 15 mg daily. Rare Xanax 0.5-1 mg. Pt drives without problems. Plan: No med changes  01/24/2021 appointment with the following noted: Rare Xanax with little anxiety.  Peace after mother died. Takes Abilify consistently 15 mg nightly. Sleeps well after 8 hours. No sig fear nor anxiety.  B checks on her.  Supportive local community.  Has dogs. No SE.   Painting and doing things around the house.  No problems driving. Plan: no med changes  07/26/21 appt noted: seen with interpretor Fine.  Relaxed.  No problems with meds. Dog 28 yo with cancer. Put to sleep.   Consistent with meds Abilify 15 mg.  Has 4  dogs now.  Takes care of them herself. Stopped Xanax bc not needed. Good sleep and energy. B helps her.  His wife takes her out.  B 57 is Arboriculturist.   Plan: Continue current meds Abilify 15 mg daily and rare Xanax 0.5 to 1 mg as needed anxiety  9/19 /2023 appointment noted:  seen with translator as always Consistent with meds.  Been stable. B  and sister in law are all supportive.   Eats fruits and some vegetables.  So likes this time of year. Eats [place] food. And some meats. No sig anxiety or fear and thinks it is due to the benefit of the meds. Tries not to sleep to be able to sleep at night.  Minimal alprazolam. Awakens 630 AM and gets 8 hours of sleep .  Used to oversleep but not anymore. Consitent with Abilify 15 without SE Plan no changes  12/26/22 appt noted: Has cardiologist.  Got metoprolol ER 35 mg and feels better. Consistent with meds. No SE with meds.   I feel stable.  No fear.  Minimal anxiety.   B checks up on her and friends check on her.  Nephew also close to her.  B good health. B Emergency planning/management officer.  Sleep really good.  Heart palpitations resolved.   Drives and takes care for herself.  Takes care of own errands.   3 dogs to care for.    Other psychiatric medications tried include perphenazine which she did not like, loxapine which she did not like, and Saphris.  She complained that it gave her strange feeling under the tongue.  She has taken other psychiatric medications in the past but she does not know the names of them and we have not been able to get the records. Abilify 15 benefit.  Review of Systems:  Review of Systems  HENT:         Deaf  Cardiovascular:  Positive for palpitations. Negative for chest pain.  Neurological:  Negative for tremors.  Psychiatric/Behavioral:  Negative for agitation, behavioral problems, confusion, decreased concentration, dysphoric mood, hallucinations, self-injury, sleep disturbance and suicidal ideas. The patient is not  nervous/anxious and is not hyperactive.     Medications: I have reviewed the patient's current medications.  Current Outpatient Medications  Medication Sig Dispense Refill   albuterol (VENTOLIN HFA) 108 (90 Base) MCG/ACT inhaler Inhale 2 puffs into the lungs every 4 (four) hours as needed for wheezing or shortness of breath. 8 g 5   AMBULATORY NON FORMULARY MEDICATION Nitroglycerine ointment 0.125 %  Apply a pea sized amount internally four times daily. Dispense 30 GM 1 refill 30 g 1   ARIPiprazole (ABILIFY) 15 MG tablet TAKE 1 TABLET(15 MG) BY MOUTH DAILY 90 tablet 2   clotrimazole-betamethasone (LOTRISONE) cream Apply 1 Application topically daily. 30 g 0  famotidine (PEPCID) 40 MG tablet Take 1 tablet (40 mg total) by mouth daily. (Patient taking differently: Take 40 mg by mouth as needed for heartburn or indigestion.) 90 tablet 3   fluticasone (FLOVENT HFA) 110 MCG/ACT inhaler Inhale 2 puffs into the lungs 2 (two) times daily. 1 Inhaler 12   levothyroxine (SYNTHROID) 75 MCG tablet TAKE 1 TABLET(75 MCG) BY MOUTH DAILY 90 tablet 3   metoprolol succinate (TOPROL XL) 25 MG 24 hr tablet Take 1 tablet (25 mg total) by mouth at bedtime. 90 tablet 3   potassium chloride (KLOR-CON) 8 MEQ tablet TAKE 1 TABLET(8 MEQ) BY MOUTH DAILY 90 tablet 3   No current facility-administered medications for this visit.    Medication Side Effects: None  Allergies:  Allergies  Allergen Reactions   Doxycycline Nausea And Vomiting   Hydrocodone     Nausea from cough syrup - ?? Very remote   Other     Dog, cat, nuts, grass, trees    Past Medical History:  Diagnosis Date   Anxiety    Constipation    Deafness    Depression    Diabetes mellitus (HCC)    Gastroparesis    Hyperlipemia    Hypertension    Hypothyroidism     Family History  Problem Relation Age of Onset   Diabetes Mother    Colon polyps Mother        over 150 polyps detected   Heart disease Father    Multiple myeloma Father     Emphysema Father    Heart attack Father    Liver cancer Maternal Grandmother        not primary source, spread to her liver   Heart attack Maternal Grandfather    Emphysema Paternal Grandfather    Colon cancer Neg Hx    Esophageal cancer Neg Hx    Rectal cancer Neg Hx    Stomach cancer Neg Hx     Social History   Socioeconomic History   Marital status: Divorced    Spouse name: Not on file   Number of children: 0   Years of education: Not on file   Highest education level: Not on file  Occupational History   Occupation: disablied    Employer: RETIRED  Tobacco Use   Smoking status: Never   Smokeless tobacco: Never  Vaping Use   Vaping status: Never Used  Substance and Sexual Activity   Alcohol use: Never   Drug use: Never   Sexual activity: Not on file  Other Topics Concern   Not on file  Social History Narrative   Not on file   Social Determinants of Health   Financial Resource Strain: Low Risk  (02/19/2022)   Overall Financial Resource Strain (CARDIA)    Difficulty of Paying Living Expenses: Not hard at all  Food Insecurity: No Food Insecurity (02/19/2022)   Hunger Vital Sign    Worried About Running Out of Food in the Last Year: Never true    Ran Out of Food in the Last Year: Never true  Transportation Needs: No Transportation Needs (02/19/2022)   PRAPARE - Administrator, Civil Service (Medical): No    Lack of Transportation (Non-Medical): No  Physical Activity: Sufficiently Active (02/19/2022)   Exercise Vital Sign    Days of Exercise per Week: 5 days    Minutes of Exercise per Session: 30 min  Stress: No Stress Concern Present (02/19/2022)   [employer] of Occupational Health - Occupational Stress Questionnaire  Feeling of Stress : Not at all  Social Connections: Moderately Integrated (02/19/2022)   Social Connection and Isolation Panel [NHANES]    Frequency of Communication with Friends and Family: More than three times a week     Frequency of Social Gatherings with Friends and Family: More than three times a week    Attends Religious Services: More than 4 times per year    Active Member of [employer] or Organizations: Yes    Attends Banker Meetings: More than 4 times per year    Marital Status: Divorced  Intimate Partner Violence: Not At Risk (02/19/2022)   Humiliation, Afraid, Rape, and Kick questionnaire    Fear of Current or Ex-Partner: No    Emotionally Abused: No    Physically Abused: No    Sexually Abused: No   She drives.  Still lives with her neighbor.  Past Medical History, Surgical history, Social history, and Family history were reviewed and updated as appropriate.   Please see review of systems for further details on the patient's review from today.   Objective:   Physical Exam:  There were no vitals taken for this visit.  Physical Exam Constitutional:      General: She is not in acute distress.    Appearance: She is well-developed.  Musculoskeletal:        General: No deformity.  Neurological:     Mental Status: She is alert and oriented to person, place, and time.     Motor: No tremor.     Coordination: Coordination normal.     Gait: Gait normal.  Psychiatric:        Attention and Perception: She is attentive. She does not perceive auditory hallucinations.        Mood and Affect: Mood is not anxious or depressed. Affect is not labile or inappropriate.        Speech: Speech normal.        Behavior: Behavior normal. Behavior is not agitated, aggressive or hyperactive. Behavior is cooperative.        Thought Content: Thought content is not paranoid or delusional. Thought content does not include homicidal or suicidal ideation. Thought content does not include suicidal plan.        Cognition and Memory: Cognition normal.        Judgment: Judgment normal.     Comments: Insight fair. No auditory or visual hallucinations. No delusions.  Pleasant     Lab Review:     Component  Value Date/Time   NA 137 08/21/2022 1437   K 4.0 08/21/2022 1437   CL 99 08/21/2022 1437   CO2 30 08/21/2022 1437   GLUCOSE 91 08/21/2022 1437   BUN 6 08/21/2022 1437   CREATININE 0.65 08/21/2022 1437   CALCIUM 9.7 08/21/2022 1437   PROT 7.6 08/21/2022 1437   ALBUMIN 4.1 08/21/2022 1437   AST 35 08/21/2022 1437   ALT 25 08/21/2022 1437   ALKPHOS 122 (H) 08/21/2022 1437   BILITOT 0.9 08/21/2022 1437   GFRNONAA 95 07/11/2008 1616   GFRAA 115 07/11/2008 1616       Component Value Date/Time   WBC 10.2 08/21/2022 1437   RBC 5.22 (H) 08/21/2022 1437   HGB 15.7 (H) 08/21/2022 1437   HCT 45.7 08/21/2022 1437   PLT 149.0 (L) 08/21/2022 1437   MCV 87.6 08/21/2022 1437   MCHC 34.3 08/21/2022 1437   RDW 13.1 08/21/2022 1437   LYMPHSABS 3.6 08/21/2022 1437   MONOABS 0.7 08/21/2022 1437  EOSABS 0.3 08/21/2022 1437   BASOSABS 0.1 08/21/2022 1437    No results found for: "POCLITH", "LITHIUM"   No results found for: "PHENYTOIN", "PHENOBARB", "VALPROATE", "CBMZ"   .res Assessment: Plan:    Kielynn was seen today for follow-up.  Diagnoses and all orders for this visit:  Paranoid schizophrenia (HCC)  Insomnia due to other mental disorder   30 min of face to face time with patient and an interpretor for 30 mins was spent on counseling and coordination of care. We discussed potential metabolic side effects associated with atypical antipsychotics, as well as potential risk for movement side effects. Advised pt to contact office if movement side effects occur.  Good response with meds.  No paranoid feelings with good response to Abilify 15 mg.  No change indicated.Marland Kitchen No AIM  She is agreed to continue Abilify 15 mg which she has taken for many years in the past to good effect.  She had a brief period of time where she was convinced it was not the right medicine for her.  We were not able to find any other medications that she felt satisfied taking.  It does not not appear that she has  active psychosis at the moment.  She has a history of repeated paranoid psychotic episodes.  During some of these she could become agitated and aggressive with her mother.  She agrees that this is a good medicine for her  No SE.  Her labs are followed by her primary care doctor.  Dx OSA but not using CPAP.  Encouraged to do so.  Follow-up 6-9  months.    Meredith Staggers MD, DFAPA  Please see After Visit Summary for patient specific instructions.  Future Appointments  Date Time Provider Department Center  02/20/2023  1:40 PM Plotnikov, Georgina Quint, MD [facility] None    No orders of the defined types were placed in this encounter.     -------------------------------

## 2023-02-06 ENCOUNTER — Other Ambulatory Visit: Payer: Self-pay | Admitting: Internal Medicine

## 2023-02-07 ENCOUNTER — Other Ambulatory Visit: Payer: Self-pay | Admitting: Radiology

## 2023-02-07 ENCOUNTER — Encounter: Payer: Self-pay | Admitting: Radiology

## 2023-02-07 ENCOUNTER — Telehealth: Payer: Self-pay | Admitting: Internal Medicine

## 2023-02-07 MED ORDER — POTASSIUM CHLORIDE ER 8 MEQ PO TBCR: EXTENDED_RELEASE_TABLET | ORAL | 3 refills | Status: DC

## 2023-02-07 NOTE — Telephone Encounter (Signed)
Prescription Request  02/07/2023  LOV: 08/21/2022  What is the name of the medication or equipment?  potassium chloride (KLOR-CON) 8 MEQ tablet    Have you contacted your pharmacy to request a refill? No   Which pharmacy would you like this sent to?  [id number] - [place], [place] - [address] AT [facility] OF [address] [place] [address] Phone: [phone] Fax: [phone]    Patient notified that their request is being sent to the clinical staff for review and that they should receive a response within 2 business days.   Please advise at Mobile [phone] (mobile)

## 2023-02-20 ENCOUNTER — Ambulatory Visit: Payer: Medicare Other | Admitting: Internal Medicine

## 2023-02-20 ENCOUNTER — Encounter: Payer: Self-pay | Admitting: Internal Medicine

## 2023-02-20 VITALS — BP 110/64 | HR 84 | Temp 98.0°F | Ht 65.0 in | Wt 188.0 lb

## 2023-02-20 DIAGNOSIS — F321 Major depressive disorder, single episode, moderate: Secondary | ICD-10-CM | POA: Diagnosis not present

## 2023-02-20 DIAGNOSIS — E034 Atrophy of thyroid (acquired): Secondary | ICD-10-CM

## 2023-02-20 DIAGNOSIS — R7303 Prediabetes: Secondary | ICD-10-CM

## 2023-02-20 DIAGNOSIS — Z23 Encounter for immunization: Secondary | ICD-10-CM | POA: Diagnosis not present

## 2023-02-20 DIAGNOSIS — R002 Palpitations: Secondary | ICD-10-CM

## 2023-02-20 DIAGNOSIS — R5383 Other fatigue: Secondary | ICD-10-CM

## 2023-02-20 LAB — COMPREHENSIVE METABOLIC PANEL
ALT: 21 U/L (ref 0–35)
AST: 28 U/L (ref 0–37)
Albumin: 4.1 g/dL (ref 3.5–5.2)
Alkaline Phosphatase: 110 U/L (ref 39–117)
BUN: 7 mg/dL (ref 6–23)
CO2: 29 meq/L (ref 19–32)
Calcium: 9.7 mg/dL (ref 8.4–10.5)
Chloride: 99 meq/L (ref 96–112)
Creatinine, Ser: 0.71 mg/dL (ref 0.40–1.20)
GFR: 91.61 mL/min (ref >60.00)
Glucose, Bld: 92 mg/dL (ref 70–99)
Potassium: 3.9 meq/L (ref 3.5–5.1)
Sodium: 138 meq/L (ref 135–145)
Total Bilirubin: 0.9 mg/dL (ref 0.2–1.2)
Total Protein: 7.2 g/dL (ref 6.0–8.3)

## 2023-02-20 LAB — TSH: TSH: 0.5 u[IU]/mL (ref 0.35–5.50)

## 2023-02-20 LAB — HEMOGLOBIN A1C: Hgb A1c MFr Bld: 5.9 % (ref 4.6–6.5)

## 2023-02-20 LAB — T4, FREE: Free T4: 1.5 ng/dL (ref 0.60–1.60)

## 2023-02-20 NOTE — Assessment & Plan Note (Signed)
Continue on Abilify ?

## 2023-02-20 NOTE — Assessment & Plan Note (Addendum)
Sx's resolved on Toprol PVCs are asymptomatic

## 2023-02-20 NOTE — Assessment & Plan Note (Signed)
Monitor A1c Good diet

## 2023-02-20 NOTE — Progress Notes (Signed)
Subjective:  Patient ID: Heidi Good, female    DOB: [birth year]  Age: 62 y.o. MRN: [id number]  CC: Follow-up (6 mnth f/u)   HPI Heidi Good presents for palpitations, HTN, hypothyroidism, elev gluse Here w/Kathy Dog Wolf died  Outpatient Medications Prior to Visit  Medication Sig Dispense Refill   albuterol (VENTOLIN HFA) 108 (90 Base) MCG/ACT inhaler Inhale 2 puffs into the lungs every 4 (four) hours as needed for wheezing or shortness of breath. 8 g 5   AMBULATORY NON FORMULARY MEDICATION Nitroglycerine ointment 0.125 %  Apply a pea sized amount internally four times daily. Dispense 30 GM 1 refill 30 g 1   ARIPiprazole (ABILIFY) 15 MG tablet Take 1 tablet (15 mg total) by mouth daily. 90 tablet 2   clotrimazole-betamethasone (LOTRISONE) cream Apply 1 Application topically daily. 30 g 0   famotidine (PEPCID) 40 MG tablet Take 1 tablet (40 mg total) by mouth daily. (Patient taking differently: Take 40 mg by mouth as needed for heartburn or indigestion.) 90 tablet 3   fluticasone (FLOVENT HFA) 110 MCG/ACT inhaler Inhale 2 puffs into the lungs 2 (two) times daily. 1 Inhaler 12   levothyroxine (SYNTHROID) 75 MCG tablet TAKE 1 TABLET(75 MCG) BY MOUTH DAILY 90 tablet 3   metoprolol succinate (TOPROL XL) 25 MG 24 hr tablet Take 1 tablet (25 mg total) by mouth at bedtime. 90 tablet 3   potassium chloride (KLOR-CON) 8 MEQ tablet TAKE 1 TABLET(8 MEQ) BY MOUTH DAILY 90 tablet 3   No facility-administered medications prior to visit.    ROS: Review of Systems  Constitutional:  Negative for activity change, appetite change, chills, fatigue and unexpected weight change.  HENT:  Positive for hearing loss. Negative for congestion, mouth sores, sinus pressure and trouble swallowing.   Eyes:  Negative for visual disturbance.  Respiratory:  Negative for cough and chest tightness.   Gastrointestinal:  Negative for abdominal pain and nausea.  Genitourinary:  Negative for difficulty urinating,  frequency and vaginal pain.  Musculoskeletal:  Negative for back pain and gait problem.  Skin:  Negative for pallor and rash.  Neurological:  Negative for dizziness, tremors, weakness, numbness and headaches.  Psychiatric/Behavioral:  Negative for confusion and sleep disturbance.     Objective:  BP 110/64 (BP Location: Left Arm, Patient Position: Sitting, Cuff Size: Normal)   Pulse 84   Temp 98 F (36.7 C) (Oral)   Ht 5\' 5"  (1.651 m)   Wt 188 lb (85.3 kg)   SpO2 97%   BMI 31.28 kg/m   BP Readings from Last 3 Encounters:  02/20/23 110/64  09/18/22 (!) 172/84  08/21/22 130/78    Wt Readings from Last 3 Encounters:  02/20/23 188 lb (85.3 kg)  09/18/22 187 lb 9.6 oz (85.1 kg)  08/21/22 188 lb (85.3 kg)    Physical Exam Constitutional:      General: She is not in acute distress.    Appearance: She is well-developed. She is obese.  HENT:     Head: Normocephalic.     Right Ear: External ear normal.     Left Ear: External ear normal.     Nose: Nose normal.  Eyes:     General:        Right eye: No discharge.        Left eye: No discharge.     Conjunctiva/sclera: Conjunctivae normal.     Pupils: Pupils are equal, round, and reactive to light.  Neck:     Thyroid:  No thyromegaly.     Vascular: No JVD.     Trachea: No tracheal deviation.  Cardiovascular:     Rate and Rhythm: Normal rate. Rhythm irregular.     Heart sounds: Normal heart sounds.  Pulmonary:     Effort: No respiratory distress.     Breath sounds: No stridor. No wheezing.  Abdominal:     General: Bowel sounds are normal. There is no distension.     Palpations: Abdomen is soft. There is no mass.     Tenderness: There is no abdominal tenderness. There is no guarding or rebound.  Musculoskeletal:        General: No tenderness.     Cervical back: Normal range of motion and neck supple. No rigidity.  Lymphadenopathy:     Cervical: No cervical adenopathy.  Skin:    Findings: No erythema or rash.   Neurological:     Cranial Nerves: No cranial nerve deficit.     Motor: No abnormal muscle tone.     Coordination: Coordination normal.     Deep Tendon Reflexes: Reflexes normal.  Psychiatric:        Behavior: Behavior normal.        Thought Content: Thought content normal.        Judgment: Judgment normal.   PVCs  Lab Results  Component Value Date   WBC 10.2 08/21/2022   HGB 15.7 (H) 08/21/2022   HCT 45.7 08/21/2022   PLT 149.0 (L) 08/21/2022   GLUCOSE 91 08/21/2022   CHOL 216 (H) 08/21/2022   TRIG 171.0 (H) 08/21/2022   HDL 52.80 08/21/2022   LDLDIRECT 52.0 03/07/2020   LDLCALC 129 (H) 08/21/2022   ALT 25 08/21/2022   AST 35 08/21/2022   NA 137 08/21/2022   K 4.0 08/21/2022   CL 99 08/21/2022   CREATININE 0.65 08/21/2022   BUN 6 08/21/2022   CO2 30 08/21/2022   TSH 0.41 08/21/2022   HGBA1C 5.9 08/21/2022    MM 3D SCREENING MAMMOGRAM BILATERAL BREAST  Result Date: 12/06/2022 CLINICAL DATA:  Screening. EXAM: DIGITAL SCREENING BILATERAL MAMMOGRAM WITH TOMOSYNTHESIS AND CAD TECHNIQUE: Bilateral screening digital craniocaudal and mediolateral oblique mammograms were obtained. Bilateral screening digital breast tomosynthesis was performed. The images were evaluated with computer-aided detection. COMPARISON:  Previous exam(s). ACR Breast Density Category c: The breasts are heterogeneously dense, which may obscure small masses. FINDINGS: There are no findings suspicious for malignancy. IMPRESSION: No mammographic evidence of malignancy. A result letter of this screening mammogram will be mailed directly to the patient. RECOMMENDATION: Screening mammogram in one year. (Code:SM-B-01Y) BI-RADS CATEGORY  1: Negative. Electronically Signed   By: Baird Lyons M.D.   On: 12/06/2022 08:47    Assessment & Plan:   Problem List Items Addressed This Visit     Hypothyroidism   Relevant Orders   Comprehensive metabolic panel   Hemoglobin A1c   T4, free   TSH   Urinalysis   Major  depressive disorder, single episode, moderate (HCC)    Continue on Abilify      Prediabetes    Monitor A1c Good diet      Relevant Orders   Comprehensive metabolic panel   Hemoglobin A1c   T4, free   TSH   Urinalysis   Fatigue   Relevant Orders   Comprehensive metabolic panel   Hemoglobin A1c   T4, free   TSH   Urinalysis   Palpitations    Sx's resolved on Toprol PVCs are asymptomatic  Other Visit Diagnoses     Need for influenza vaccination    -  Primary   Relevant Orders   Flu vaccine trivalent PF, 6mos and older(Flulaval,Afluria,Fluarix,Fluzone) (Completed)         No orders of the defined types were placed in this encounter.     Follow-up: Return in about 6 months (around 08/21/2023) for Wellness Exam.  Sonda Primes, MD

## 2023-03-17 ENCOUNTER — Encounter: Payer: Self-pay | Admitting: Podiatry

## 2023-03-17 ENCOUNTER — Ambulatory Visit (INDEPENDENT_AMBULATORY_CARE_PROVIDER_SITE_OTHER): Payer: Medicare Other

## 2023-03-17 ENCOUNTER — Ambulatory Visit (INDEPENDENT_AMBULATORY_CARE_PROVIDER_SITE_OTHER): Payer: Medicare Other | Admitting: Podiatry

## 2023-03-17 DIAGNOSIS — L84 Corns and callosities: Secondary | ICD-10-CM

## 2023-03-17 DIAGNOSIS — M2042 Other hammer toe(s) (acquired), left foot: Secondary | ICD-10-CM | POA: Diagnosis not present

## 2023-03-17 NOTE — Patient Instructions (Signed)
Look for urea 40% cream or ointment and apply to the thickened dry skin / calluses. This can be bought over the counter, at a pharmacy or online such as [employer].    More silicone pads can be purchased from:  [email or web address]  

## 2023-03-17 NOTE — Progress Notes (Signed)
  Subjective:  Patient ID: Heidi Good, female    DOB: March 10, 1961,  MRN: [id number]  Chief Complaint  Patient presents with   Toe Pain    "I think I have a wart on my little toe     62 y.o. female presents with the above complaint. History confirmed with patient.  She said it has been bothering her for about a month she got a corn pad but has not really used it yet  Objective:  Physical Exam: warm, good capillary refill, no trophic changes or ulcerative lesions, normal DP and PT pulses, normal sensory exam, and hyperkeratosis over the left fifth PIPJ with tenderness.  Radiographs: Multiple views x-ray of the left foot: Adductovarus contracture of the fifth toe with prominent bone at the proximal head of the phalanx Assessment:   1. Hammer toe of left foot   2. Callus of foot      Plan:  Patient was evaluated and treated and all questions answered.  We reviewed her x-rays and discussed the presence of the contracture of the toe contributing to the hyperkeratosis and strategies to offload and mitigate this including urea cream and use of a silicone pad.  We also discussed surgical reduction of this if it does not improve or worsens.  The lesion was debrided with a sharp scalpel to a tolerable level.  Return if symptoms worsen or fail to improve.

## 2023-05-16 ENCOUNTER — Ambulatory Visit: Payer: Self-pay | Admitting: Internal Medicine

## 2023-05-16 DIAGNOSIS — H00022 Hordeolum internum right lower eyelid: Secondary | ICD-10-CM | POA: Diagnosis not present

## 2023-05-16 NOTE — Telephone Encounter (Signed)
 FYI: patient has some swelling red and painful right eye she stated the eye lid is purple as well   Woke up swollen pink some purple, purple bag under eye, pain don;t feel great, some eye drops Just woke up this way, don't know what happened, all on its own   No pain 5/10, bothering me Warm compress didn't help  Very sensitive Eye itself is pink/red

## 2023-05-16 NOTE — Telephone Encounter (Signed)
 Copied from CRM [id number]. Topic: Clinical - Red Word Triage >> May 16, 2023 11:34 AM Deidre DASEN wrote: Red Word that prompted transfer to Nurse Triage: patient has some swelling red and painful right eye she stated the eye lid is purple as well  Woke up swollen pink some purple, purple bag under eye, pain don;t feel great, some eye drops Just woke up this way, don't know what happened, all on its own  No pain 5/10, bothering me Warm compress didn't help  Very sensitive Eye itself is pink/red  Chief Complaint: Eye pain, swelling Symptoms: Eye pain, redness to eye and eyelid, discoloration and puffiness to eyelid Frequency: intermittent pain Pertinent Negatives: Patient denies injury to her knowledge Disposition: [] ED /[x] Urgent Care (no appt availability in office) / [] Appointment(In office/virtual)/ []  [facility]/ [] Home Care/ [] Refused Recommended Disposition /[] [facility]/ []  Follow-up with PCP Additional Notes: Pt reporting through interpreter that she woke up with pain, swelling, and discoloration to her right eye. Pt reporting swollen pink, some purple, purple bag under eye, confirms just puffy not swollen almost shut. Pt reporting just woke up this way, don't know what happened, all on its own, but looks like I've been hit. Pt reporting eye itself is red/pink and 5/10 pain to outside and bag under eye. Pt confirms she can see okay. Pt confirms no drainage, feels very dry. Pt reporting she tried eye drops and warm compress with no relief. Pt requesting appt today. No availability with PCP office. Pt confirms she has an eye doc established. Pt declines appt at other office. Advised call eye doc to see about acute appt today or go to UC, pt verbalized understanding. Pt stated she will call eye doc and if no availability will go to UC.  Reason for Disposition  MODERATE eye pain or discomfort (e.g., interferes with normal activities or awakens from sleep;  more than mild)  Answer Assessment - Initial Assessment Questions 1. ONSET: When did the pain start? (e.g., minutes, hours, days)     Just when woke up this morning 2. TIMING: Does the pain come and go, or has it been constant since it started? (e.g., constant, intermittent, fleeting)     intermittent 3. SEVERITY: How bad is the pain?   (Scale 1-10; mild, moderate or severe)   - MILD (1-3): doesn't interfere with normal activities    - MODERATE (4-7): interferes with normal activities or awakens from sleep    - SEVERE (8-10): excruciating pain and patient unable to do normal activities     5/10 4. LOCATION: Where does it hurt?  (e.g., eyelid, eye, cheekbone)     Outside and bag under eye, looks like I've been hit 6. VISION: Do you have blurred vision or changes in your vision?      Can see okay 7. EYE DISCHARGE: Is there any discharge (pus) from the eye(s)?  If Yes, ask: What color is it?      No drainage, feels very dry 8. FEVER: Do you have a fever? If Yes, ask: What is it, how was it measured, and when did it start?      Denies 9. OTHER SYMPTOMS: Do you have any other symptoms? (e.g., headache, nasal discharge, facial rash)     Denies. Swelling, discoloration to right eye, swollen pink some purple, purple bag under eye, just puffy  Protocols used: Eye Pain and Other Symptoms-A-AH

## 2023-05-18 NOTE — Telephone Encounter (Signed)
 The patient needs to go to urgent care or have an office visit.  Thank you

## 2023-05-19 NOTE — Telephone Encounter (Signed)
 Tried reaching out to the pt to inform her of Dr. Loren Racer advice "The patient needs to go to urgent care or have an office visit. Thank you"

## 2023-06-04 DIAGNOSIS — H00022 Hordeolum internum right lower eyelid: Secondary | ICD-10-CM | POA: Diagnosis not present

## 2023-06-04 DIAGNOSIS — H04123 Dry eye syndrome of bilateral lacrimal glands: Secondary | ICD-10-CM | POA: Diagnosis not present

## 2023-07-14 ENCOUNTER — Encounter: Payer: Self-pay | Admitting: Family Medicine

## 2023-07-14 ENCOUNTER — Ambulatory Visit (INDEPENDENT_AMBULATORY_CARE_PROVIDER_SITE_OTHER): Admitting: Family Medicine

## 2023-07-14 VITALS — BP 126/62 | HR 42 | Resp 20 | Ht 65.0 in | Wt 192.0 lb

## 2023-07-14 DIAGNOSIS — J4520 Mild intermittent asthma, uncomplicated: Secondary | ICD-10-CM | POA: Diagnosis not present

## 2023-07-14 DIAGNOSIS — R001 Bradycardia, unspecified: Secondary | ICD-10-CM

## 2023-07-14 DIAGNOSIS — J069 Acute upper respiratory infection, unspecified: Secondary | ICD-10-CM | POA: Diagnosis not present

## 2023-07-14 MED ORDER — ALBUTEROL SULFATE HFA 108 (90 BASE) MCG/ACT IN AERS: 2.0000 | INHALATION_SPRAY | RESPIRATORY_TRACT | 0 refills | Status: DC | PRN

## 2023-07-14 MED ORDER — BENZONATATE 200 MG PO CAPS
200.0000 mg | ORAL_CAPSULE | Freq: Three times a day (TID) | ORAL | 0 refills | Status: DC | PRN
Start: 2023-07-14 — End: 2023-07-25

## 2023-07-14 NOTE — Progress Notes (Signed)
 In-person sign language interpreter, Olegario Messier, used for the duration of the visit.   Assessment & Plan:  1. Viral URI (Primary) Education provided on viral URI.  Discussed typical duration and progression of URIs.  Continue symptom management. - benzonatate (TESSALON) 200 MG capsule; Take 1 capsule (200 mg total) by mouth 3 (three) times daily as needed for cough.  Dispense: 30 capsule; Refill: 0  2. Mild intermittent asthma without complication Well controlled on current regimen; patient only needs a refill of her albuterol inhaler. - albuterol (VENTOLIN HFA) 108 (90 Base) MCG/ACT inhaler; Inhale 2 puffs into the lungs every 4 (four) hours as needed for wheezing or shortness of breath.  Dispense: 18 g; Refill: 0  3. Bradycardia Patient denies feeling dizzy or like she may pass out. Encouraged to monitor her heart rate at home, keep a log, and follow-up with her cardiologist prior to her 1 year follow-up in May.   Follow up plan: Return if symptoms worsen or fail to improve.  Deliah Boston, MSN, APRN, FNP-C  Subjective:  HPI: Heidi Good is a 63 y.o. female presenting on 07/14/2023 for Cough (Cough, runny nose, congestion= clear and chest sore from cough - this started Friday /Just tired, no GI issues )  Patient complains of cough, head congestion, runny nose, sore throat, fatigue, and muscle pain between ribs in chest due to coughing . Sputum is clear. She denies headache, fever, shortness of breath, wheezing, nausea, vomiting, and diarrhea. Onset of symptoms was 3 days ago, gradually improving since that time. She is drinking plenty of fluids. Evaluation to date: none. Treatment to date:  Robitussin/Tylenol and Albuterol inhaler rarely . She does not feel the Robitussin has been very helpful. She has a history of asthma. She does not smoke.    ROS: Negative unless specifically indicated above in HPI.   Relevant past medical history reviewed and updated as indicated.   Allergies and  medications reviewed and updated.   Current Outpatient Medications:    AMBULATORY NON FORMULARY MEDICATION, Nitroglycerine ointment 0.125 %  Apply a pea sized amount internally four times daily. Dispense 30 GM 1 refill, Disp: 30 g, Rfl: 1   ARIPiprazole (ABILIFY) 15 MG tablet, Take 1 tablet (15 mg total) by mouth daily., Disp: 90 tablet, Rfl: 2   benzonatate (TESSALON) 200 MG capsule, Take 1 capsule (200 mg total) by mouth 3 (three) times daily as needed for cough., Disp: 30 capsule, Rfl: 0   clotrimazole-betamethasone (LOTRISONE) cream, Apply 1 Application topically daily., Disp: 30 g, Rfl: 0   famotidine (PEPCID) 40 MG tablet, Take 1 tablet (40 mg total) by mouth daily. (Patient taking differently: Take 40 mg by mouth as needed for heartburn or indigestion.), Disp: 90 tablet, Rfl: 3   levothyroxine (SYNTHROID) 75 MCG tablet, TAKE 1 TABLET(75 MCG) BY MOUTH DAILY, Disp: 90 tablet, Rfl: 3   metoprolol succinate (TOPROL XL) 25 MG 24 hr tablet, Take 1 tablet (25 mg total) by mouth at bedtime., Disp: 90 tablet, Rfl: 3   potassium chloride (KLOR-CON) 8 MEQ tablet, TAKE 1 TABLET(8 MEQ) BY MOUTH DAILY, Disp: 90 tablet, Rfl: 3   albuterol (VENTOLIN HFA) 108 (90 Base) MCG/ACT inhaler, Inhale 2 puffs into the lungs every 4 (four) hours as needed for wheezing or shortness of breath., Disp: 18 g, Rfl: 0  Allergies  Allergen Reactions   Doxycycline Nausea And Vomiting   Hydrocodone     Nausea from cough syrup - ?? Very remote   Other  Dog, cat, nuts, grass, trees    Objective:   BP 126/62   Pulse (!) 42   Resp 20   Ht 5\' 5"  (1.651 m)   Wt 192 lb (87.1 kg)   SpO2 98%   BMI 31.95 kg/m    Physical Exam Vitals reviewed.  Constitutional:      General: She is not in acute distress.    Appearance: Normal appearance. She is not ill-appearing, toxic-appearing or diaphoretic.  HENT:     Head: Normocephalic and atraumatic.     Right Ear: Tympanic membrane, ear canal and external ear normal. There  is no impacted cerumen.     Left Ear: Tympanic membrane, ear canal and external ear normal. There is no impacted cerumen.     Nose: Nose normal. No congestion or rhinorrhea.     Right Sinus: No maxillary sinus tenderness or frontal sinus tenderness.     Left Sinus: No maxillary sinus tenderness or frontal sinus tenderness.     Mouth/Throat:     Mouth: Mucous membranes are moist.     Pharynx: Oropharynx is clear. No oropharyngeal exudate or posterior oropharyngeal erythema.  Eyes:     General: No scleral icterus.       Right eye: No discharge.        Left eye: No discharge.     Conjunctiva/sclera: Conjunctivae normal.  Cardiovascular:     Rate and Rhythm: Regular rhythm. Bradycardia present.     Heart sounds: Normal heart sounds. No murmur heard.    No friction rub. No gallop.  Pulmonary:     Effort: Pulmonary effort is normal. No respiratory distress.     Breath sounds: Normal breath sounds. No stridor. No wheezing, rhonchi or rales.  Musculoskeletal:        General: Normal range of motion.     Cervical back: Normal range of motion.  Lymphadenopathy:     Cervical: No cervical adenopathy.  Skin:    General: Skin is warm and dry.     Capillary Refill: Capillary refill takes less than 2 seconds.  Neurological:     General: No focal deficit present.     Mental Status: She is alert and oriented to person, place, and time. Mental status is at baseline.  Psychiatric:        Mood and Affect: Mood normal.        Behavior: Behavior normal.        Thought Content: Thought content normal.        Judgment: Judgment normal.

## 2023-07-14 NOTE — Patient Instructions (Addendum)
 Username: [email or web address]  Password: [email or web address]

## 2023-07-15 ENCOUNTER — Telehealth: Payer: Self-pay | Admitting: Cardiology

## 2023-07-15 DIAGNOSIS — R002 Palpitations: Secondary | ICD-10-CM

## 2023-07-15 MED ORDER — METOPROLOL SUCCINATE ER 25 MG PO TB24
12.5000 mg | ORAL_TABLET | Freq: Every day | ORAL | Status: DC
Start: 2023-07-15 — End: 2023-07-23

## 2023-07-15 NOTE — Telephone Encounter (Signed)
Returned call to patient left message on personal voice mail to cal back.

## 2023-07-15 NOTE — Telephone Encounter (Signed)
 Received a call back from patient with help of sign language interpreter.Patient stated she saw PCP yesterday for a upper respiratory  virus.He was concerned her pulse 41.B/P 126/72. She is unable to check B/P and pulse this morning her monitor is broke.She wanted to know if she needs to decrease Metoprolol.She takes 25 mg daily.Appointment scheduled with Reather Littler NP 3/12 at 2:45 pm.She will buy a new monitor and check pulse and B/P daily.She will bring readings to appointment.Message sent to [facility] for advice.

## 2023-07-15 NOTE — Telephone Encounter (Signed)
 Spoke with pt with sign language interpreter, Aware of dr Ludwig Clarks recommendations.

## 2023-07-15 NOTE — Telephone Encounter (Signed)
 Patient c/o Palpitations:  STAT if patient reporting lightheadedness, shortness of breath, or chest pain  How long have you had palpitations/irregular HR/ Afib? Are you having the symptoms now? yesterday  Are you currently experiencing lightheadedness, SOB or CP? No  Do you have a history of afib (atrial fibrillation) or irregular heart rhythm? Last year had an episode of increased HR  Have you checked your BP or HR? (document readings if available): HR 41 BP 126/?  Are you experiencing any other symptoms? Seen at pcp office yesterday and HR was 41. Advised to contact cardiologist

## 2023-07-22 NOTE — Progress Notes (Unsigned)
 Cardiology Office Note    Date:  07/23/2023  ID:  Heidi Good, DOB March 23, 1961, MRN [id number] PCP:  Lewayne Bunting, MD  Cardiologist:  Olga Millers, MD  Electrophysiologist:  None   Chief Complaint: Follow up for bradycardia/palpitations   History of Present Illness: .    Heidi Good is a 63 y.o. female with visit-pertinent history of palpitations, PVCs, asthma and pre-diabetes.   Patient seen by Dr. Jens Som on 09/18/2022 for palpitations.  She had previously seen Dr. Allyson Sabal but had not been seen since January 2020.  Nuclear study in August 2018 showed ejection fraction 55% with no ischemia or infarction.  Noted to have PVCs in the past.  At office visit with Dr. Jens Som patient describing occasional fluttering in her chest, no syncope.  She denied orthopnea, PND, pedal edema or exertional chest pain.  She was started on Toprol 25 mg daily.  Echocardiogram on 10/21/2022 indicated LVEF of 55 to 60%, no RWMA, diastolic parameters were normal, RV systolic function and size was normal.  There were no significant valvular abnormalities.  Patient was evaluated by her PCP 2 weeks ago and was noted to have a heart rate of 41.  Dr. Jens Som reduced her metoprolol to 12.5 mg daily.    Today presents regarding bradycardia.  Patient reports that she is doing very well.  She denies any concerns or complaints today.  Notes that she was completely asymptomatic with her low heart rates, denied any dizziness, lightheadedness, presyncope or syncope.  She denies any increases in palpitations since starting on the reduced dose of metoprolol.  Reports she has not had any palpitations since after she started on the medication.  She notes that she does have asthma, reports that her breathing has been at baseline.  She denies any chest pain, lower extremity edema, orthopnea or PND.  She reports that she is walking regularly each day and tolerates this very well.  A sign language interpreter was present  throughout the visit, #[id number].  ROS: .   Today she denies chest pain, shortness of breath, lower extremity edema, fatigue, palpitations, melena, hematuria, hemoptysis, diaphoresis, weakness, presyncope, syncope, orthopnea, and PND.  All other systems are reviewed and otherwise negative. Studies Reviewed: Marland Kitchen    EKG:  EKG is ordered today, personally reviewed, demonstrating  EKG Interpretation Date/Time:  Wednesday July 23 2023 15:03:11 EDT Ventricular Rate:  83 PR Interval:  144 QRS Duration:  86 QT Interval:  390 QTC Calculation: 458 R Axis:   61  Text Interpretation: Sinus rhythm with frequent Premature ventricular complexes and premature atrial contractions Confirmed by Reather Littler [phone]) on 07/23/2023 3:09:31 PM   CV Studies: Cardiac studies reviewed are outlined and summarized above. Otherwise please see EMR for full report. Cardiac Studies & Procedures   ______________________________________________________________________________________________   STRESS TESTS  MYOCARDIAL PERFUSION IMAGING 12/13/2016  Narrative  The left ventricular ejection fraction is normal (55-65%).  Nuclear stress EF: 55%.  Blood pressure demonstrated a blunted response to exercise.  There was no ST segment deviation noted during stress.  The study is normal.  Normal stress nuclear study with no ischemia or infarction; EF 55 with normal wall motion; note frequent PVCs with exercise.   ECHOCARDIOGRAM  ECHOCARDIOGRAM COMPLETE 10/21/2022  Narrative ECHOCARDIOGRAM REPORT    Patient Name:   Heidi Good Date of Exam: 10/21/2022 Medical Rec #:  [id number]       Height:       65.0 in Accession #:  [id number]      Weight:       187.6 lb Date of Birth:  [birth year]        BSA:          1.925 m Patient Age:    62 years        BP:           140/82 mmHg Patient Gender: F               HR:           74 bpm. Exam Location:  [address]  Procedure: 2D Echo, Cardiac Doppler, Color Doppler and  Intracardiac Opacification Agent  Indications:    R00.2 Palpitations  History:        Patient has no prior history of Echocardiogram examinations. Risk Factors:Hypertension, Diabetes and Dyslipidemia. Hypothyroidism.  Sonographer:    Sedonia Small Rodgers-Jones RDCS Referring Phys: [id number] BRIAN S CRENSHAW  IMPRESSIONS   1. Significant beat to beat variation due to frequent ectopy. 2. Left ventricular ejection fraction, by estimation, is 55 to 60%. The left ventricle has normal function. The left ventricle has no regional wall motion abnormalities. Left ventricular diastolic parameters were normal. 3. Right ventricular systolic function is normal. The right ventricular size is normal. 4. The mitral valve is normal in structure. No evidence of mitral valve regurgitation. No evidence of mitral stenosis. 5. The aortic valve is normal in structure. Aortic valve regurgitation is not visualized. No aortic stenosis is present. 6. The inferior vena cava is normal in size with greater than 50% respiratory variability, suggesting right atrial pressure of 3 mmHg.  FINDINGS Left Ventricle: Left ventricular ejection fraction, by estimation, is 55 to 60%. The left ventricle has normal function. The left ventricle has no regional wall motion abnormalities. Definity contrast agent was given IV to delineate the left ventricular endocardial borders. The left ventricular internal cavity size was normal in size. There is no left ventricular hypertrophy. Left ventricular diastolic parameters were normal.  Right Ventricle: The right ventricular size is normal. No increase in right ventricular wall thickness. Right ventricular systolic function is normal.  Left Atrium: Left atrial size was normal in size.  Right Atrium: Right atrial size was normal in size.  Pericardium: There is no evidence of pericardial effusion.  Mitral Valve: The mitral valve is normal in structure. No evidence of mitral valve regurgitation.  No evidence of mitral valve stenosis.  Tricuspid Valve: The tricuspid valve is normal in structure. Tricuspid valve regurgitation is not demonstrated. No evidence of tricuspid stenosis.  Aortic Valve: The aortic valve is normal in structure. Aortic valve regurgitation is not visualized. No aortic stenosis is present.  Pulmonic Valve: The pulmonic valve was normal in structure. Pulmonic valve regurgitation is trivial. No evidence of pulmonic stenosis.  Aorta: The aortic root is normal in size and structure.  Venous: The inferior vena cava is normal in size with greater than 50% respiratory variability, suggesting right atrial pressure of 3 mmHg.  IAS/Shunts: No atrial level shunt detected by color flow Doppler.   LEFT VENTRICLE PLAX 2D LVIDd:         5.30 cm   Diastology LVIDs:         3.00 cm   LV e' medial:    7.67 cm/s LV PW:         0.90 cm   LV E/e' medial:  9.1 LV IVS:        0.90 cm   LV e' lateral:  6.85 cm/s LVOT diam:     1.90 cm   LV E/e' lateral: 10.2 LV SV:         54 LV SV Index:   28 LVOT Area:     2.84 cm   RIGHT VENTRICLE RV Basal diam:  4.30 cm RV S prime:     19.33 cm/s TAPSE (M-mode): 2.7 cm  LEFT ATRIUM             Index        RIGHT ATRIUM           Index LA diam:        3.40 cm 1.77 cm/m   RA Area:     14.60 cm LA Vol (A2C):   33.6 ml 17.45 ml/m  RA Volume:   39.10 ml  20.31 ml/m LA Vol (A4C):   51.3 ml 26.65 ml/m LA Biplane Vol: 43.0 ml 22.34 ml/m AORTIC VALVE LVOT Vmax:   91.20 cm/s LVOT Vmean:  59.900 cm/s LVOT VTI:    0.191 m  AORTA Ao Root diam: 3.30 cm Ao Asc diam:  3.30 cm  MITRAL VALVE MV Area (PHT): 3.53 cm    SHUNTS MV Decel Time: 215 msec    Systemic VTI:  0.19 m MV E velocity: 69.70 cm/s  Systemic Diam: 1.90 cm MV A velocity: 75.50 cm/s MV E/A ratio:  0.92  Aditya Sabharwal Electronically signed by Dorthula Nettles Signature Date/Time: 10/21/2022/2:10:58 PM    Final           ______________________________________________________________________________________________       Current Reported Medications:.    Current Meds  Medication Sig   albuterol (VENTOLIN HFA) 108 (90 Base) MCG/ACT inhaler Inhale 2 puffs into the lungs every 4 (four) hours as needed for wheezing or shortness of breath.   AMBULATORY NON FORMULARY MEDICATION Nitroglycerine ointment 0.125 %  Apply a pea sized amount internally four times daily. Dispense 30 GM 1 refill   ARIPiprazole (ABILIFY) 15 MG tablet Take 1 tablet (15 mg total) by mouth daily.   benzonatate (TESSALON) 200 MG capsule Take 1 capsule (200 mg total) by mouth 3 (three) times daily as needed for cough.   clotrimazole-betamethasone (LOTRISONE) cream Apply 1 Application topically daily.   famotidine (PEPCID) 40 MG tablet Take 1 tablet (40 mg total) by mouth daily. (Patient taking differently: Take 40 mg by mouth as needed for heartburn or indigestion.)   levothyroxine (SYNTHROID) 75 MCG tablet TAKE 1 TABLET(75 MCG) BY MOUTH DAILY   potassium chloride (KLOR-CON) 8 MEQ tablet TAKE 1 TABLET(8 MEQ) BY MOUTH DAILY   [DISCONTINUED] metoprolol succinate (TOPROL XL) 25 MG 24 hr tablet Take 0.5 tablets (12.5 mg total) by mouth at bedtime.   Physical Exam:    VS:  BP 126/72 (BP Location: Left Arm, Patient Position: Sitting, Cuff Size: Normal)   Pulse 83   Ht 5\' 5"  (1.651 m)   Wt 194 lb 3.2 oz (88.1 kg)   SpO2 96%   BMI 32.32 kg/m    Wt Readings from Last 3 Encounters:  07/23/23 194 lb 3.2 oz (88.1 kg)  07/14/23 192 lb (87.1 kg)  02/20/23 188 lb (85.3 kg)    GEN: Well nourished, well developed in no acute distress NECK: No JVD; No carotid bruits CARDIAC: RRR, no murmurs, rubs, gallops RESPIRATORY:  Clear to auscultation without rales, wheezing or rhonchi  ABDOMEN: Soft, non-tender, non-distended EXTREMITIES:  No edema; No acute deformity     Asessement and Plan:.    Palpitations/bradycardia: Patient  with history of  palpitations, frequent PVCs.  She was previously on metoprolol succinate 25 mg daily.  This was recently reduced by Dr. Jens Som in setting of bradycardia at her PCPs office with heart rates in the 40s.  Patient reports today that she was completely asymptomatic.  She does report that she has been tolerating the lower dose of metoprolol well, denies any increases in palpitations or feeling of irregular heartbeats.  She will continue to monitor and notify the office if she has recurrence of palpitations, may consider a cardiac monitor at that time.  EKG today does show PVCs and PACs, patient is completely asymptomatic.  Continue metoprolol succinate 12.5 mg daily, refill provided.  Hypertension: Blood pressure today 126/72.  Continue metoprolol succinate 12.5 mg daily.    Disposition: F/u with Dr. Jens Som in four months or sooner if needed.   Signed, Rip Harbour, NP

## 2023-07-23 ENCOUNTER — Ambulatory Visit: Attending: Cardiology | Admitting: Cardiology

## 2023-07-23 ENCOUNTER — Encounter: Payer: Self-pay | Admitting: Cardiology

## 2023-07-23 VITALS — BP 126/72 | HR 83 | Ht 65.0 in | Wt 194.2 lb

## 2023-07-23 DIAGNOSIS — R002 Palpitations: Secondary | ICD-10-CM

## 2023-07-23 DIAGNOSIS — I493 Ventricular premature depolarization: Secondary | ICD-10-CM

## 2023-07-23 MED ORDER — METOPROLOL SUCCINATE ER 25 MG PO TB24: 12.5000 mg | ORAL_TABLET | Freq: Every day | ORAL | Status: DC

## 2023-07-23 MED ORDER — METOPROLOL SUCCINATE ER 25 MG PO TB24: 12.5000 mg | ORAL_TABLET | Freq: Every day | ORAL | 3 refills | Status: DC

## 2023-07-23 NOTE — Patient Instructions (Signed)
 Medication Instructions:  No changes *If you need a refill on your cardiac medications before your next appointment, please call your pharmacy*  Lab Work: No labs  Testing/Procedures: No testing  Follow-Up: At [facility], you and your health needs are our priority.  As part of our continuing mission to provide you with exceptional heart care, we have created designated Provider Care Teams.  These Care Teams include your primary Cardiologist (physician) and Advanced Practice Providers (APPs -  Physician Assistants and Nurse Practitioners) who all work together to provide you with the care you need, when you need it.  We recommend signing up for the patient portal called "MyChart".  Sign up information is provided on this After Visit Summary.  MyChart is used to connect with patients for Virtual Visits (Telemedicine).  Patients are able to view lab/test results, encounter notes, upcoming appointments, etc.  Non-urgent messages can be sent to your provider as well.   To learn more about what you can do with MyChart, go to [email or web address].    Your next appointment:   4 month(s)  Provider:   Olga Millers, MD   Other Instructions

## 2023-07-25 ENCOUNTER — Encounter: Payer: Self-pay | Admitting: Family

## 2023-07-25 ENCOUNTER — Ambulatory Visit: Payer: Self-pay | Admitting: Cardiology

## 2023-07-25 ENCOUNTER — Ambulatory Visit (INDEPENDENT_AMBULATORY_CARE_PROVIDER_SITE_OTHER): Admitting: Family

## 2023-07-25 VITALS — BP 116/70 | HR 56 | Temp 99.3°F | Ht 65.0 in | Wt 191.0 lb

## 2023-07-25 DIAGNOSIS — J209 Acute bronchitis, unspecified: Secondary | ICD-10-CM

## 2023-07-25 DIAGNOSIS — R059 Cough, unspecified: Secondary | ICD-10-CM

## 2023-07-25 MED ORDER — AMOXICILLIN-POT CLAVULANATE 875-125 MG PO TABS
1.0000 | ORAL_TABLET | Freq: Two times a day (BID) | ORAL | 0 refills | Status: DC
Start: 2023-07-25 — End: 2023-09-18

## 2023-07-25 MED ORDER — PROMETHAZINE-DM 6.25-15 MG/5ML PO SYRP: 5.0000 mL | ORAL_SOLUTION | Freq: Four times a day (QID) | ORAL | 0 refills | Status: DC | PRN

## 2023-07-25 MED ORDER — PREDNISONE 20 MG PO TABS: 40.0000 mg | ORAL_TABLET | Freq: Every day | ORAL | 0 refills | Status: DC

## 2023-07-25 NOTE — Telephone Encounter (Signed)
 Chief Complaint: cough Symptoms: cough with vomiting Frequency: cough over a week vomiting started yesterday  Pertinent Negatives: Patient denies fever, sob, chest pain Disposition: [] ED /[] Urgent Care (no appt availability in office) / [x] Appointment(In office/virtual)/ []  [facility]/ [] Home Care/ [] Refused Recommended Disposition /[] [facility]/ []  Follow-up with PCP Additional Notes: [facility] from [place], #[id number].  Patient states that she has had a cough for over a week and on yesterday while coughing she became nauseous and had 4 episodes of vomiting and then again this morning had an episode of coughing with vomiting. Patient states that the cough medication provided last week is not really helping. Patient stated that the vomit was just clear fluid.    Copied from CRM [id number]. Topic: Clinical - Prescription Issue >> Jul 25, 2023  8:13 AM Fonda Kinder J wrote: Reason for CRM: Pt states she was prescribed benzonatate (TESSALON) 200 MG capsule for a bad cough a week ago but the medication is not working and she's constantly vomiting from it Reason for Disposition  SEVERE coughing spells (e.g., whooping sound after coughing, vomiting after coughing)  Answer Assessment - Initial Assessment Questions 1. VOMITING SEVERITY: "How many times have you vomited in the past 24 hours?"     - MILD:  1 - 2 times/day    - MODERATE: 3 - 5 times/day, decreased oral intake without significant weight loss or symptoms of dehydration    - SEVERE: 6 or more times/day, vomits everything or nearly everything, with significant weight loss, symptoms of dehydration      5 2. ONSET: "When did the vomiting begin?"      yesterday 3. FLUIDS: "What fluids or food have you vomited up today?" "Have you been able to keep any fluids down?"     yes 4. ABDOMEN PAIN: "Are your having any abdomen pain?" If Yes : "How bad is it and what does it feel like?" (e.g., crampy, dull, intermittent, constant)       no 5. DIARRHEA: "Is there any diarrhea?" If Yes, ask: "How many times today?"      no 6. CONTACTS: "Is there anyone else in the family with the same symptoms?"      no 7. CAUSE: "What do you think is causing your vomiting?"     cough 8. HYDRATION STATUS: "Any signs of dehydration?" (e.g., dry mouth [not only dry lips], too weak to stand) "When did you last urinate?"     no 9. OTHER SYMPTOMS: "Do you have any other symptoms?" (e.g., fever, headache, vertigo, vomiting blood or coffee grounds, recent head injury)     Nausea, cough  Answer Assessment - Initial Assessment Questions 1. ONSET: "When did the cough begin?"      One week ago 2. SEVERITY: "How bad is the cough today?"      mod 3. SPUTUM: "Describe the color of your sputum" (none, dry cough; clear, white, yellow, green)     clear 4. HEMOPTYSIS: "Are you coughing up any blood?" If so ask: "How much?" (flecks, streaks, tablespoons, etc.)     no 5. DIFFICULTY BREATHING: "Are you having difficulty breathing?" If Yes, ask: "How bad is it?" (e.g., mild, moderate, severe)    - MILD: No SOB at rest, mild SOB with walking, speaks normally in sentences, can lie down, no retractions, pulse < 100.    - MODERATE: SOB at rest, SOB with minimal exertion and prefers to sit, cannot lie down flat, speaks in phrases, mild retractions, audible wheezing, pulse 100-120.    -  SEVERE: Very SOB at rest, speaks in single words, struggling to breathe, sitting hunched forward, retractions, pulse > 120      no 6. FEVER: "Do you have a fever?" If Yes, ask: "What is your temperature, how was it measured, and when did it start?"     no 7. CARDIAC HISTORY: "Do you have any history of heart disease?" (e.g., heart attack, congestive heart failure)      no 8. LUNG HISTORY: "Do you have any history of lung disease?"  (e.g., pulmonary embolus, asthma, emphysema)     Asthma 10. OTHER SYMPTOMS: "Do you have any other symptoms?" (e.g., runny nose, wheezing, chest  pain)       Nausea and vomiting  Protocols used: Vomiting-A-AH, Cough - Acute Non-Productive-A-AH

## 2023-07-25 NOTE — Progress Notes (Signed)
 Acute Office Visit  Subjective:     Patient ID: Heidi Good, female    DOB: 10-06-1960, 63 y.o.   MRN: [id number]  Chief Complaint  Patient presents with   Cough    Started around 07-13-23. Benzonatate is not helping. Has had episodes of nausea and vomiting. Several yesterday.     HPI Patient is in today with complaints of a persistent cough, congestion and now nausea and vomiting that recently began yesterday.  She was seen on July 13, 2023 and prescribed Jerilynn Som that have not been beneficial.  Review of Systems  Reason unable to perform ROS: Visit occurred with sign language interpreter.  Constitutional: Negative.  Negative for chills and fever.  HENT: Negative.    Respiratory:  Positive for cough.   Cardiovascular: Negative.   Gastrointestinal:  Positive for nausea and vomiting.  Genitourinary: Negative.   Musculoskeletal: Negative.   Skin: Negative.   Psychiatric/Behavioral: Negative.     Past Medical History:  Diagnosis Date   Anxiety    Constipation    Deafness    Depression    Diabetes mellitus (HCC)    Gastroparesis    Hyperlipemia    Hypertension    Hypothyroidism     Social History   Socioeconomic History   Marital status: Divorced    Spouse name: Not on file   Number of children: 0   Years of education: Not on file   Highest education level: Not on file  Occupational History   Occupation: disablied    Employer: RETIRED  Tobacco Use   Smoking status: Never   Smokeless tobacco: Never  Vaping Use   Vaping status: Never Used  Substance and Sexual Activity   Alcohol use: Never   Drug use: Never   Sexual activity: Not on file  Other Topics Concern   Not on file  Social History Narrative   Not on file   Social Drivers of Health   Financial Resource Strain: Low Risk  (02/19/2022)   Overall Financial Resource Strain (CARDIA)    Difficulty of Paying Living Expenses: Not hard at all  Food Insecurity: No Food Insecurity (02/19/2022)    Hunger Vital Sign    Worried About Running Out of Food in the Last Year: Never true    Ran Out of Food in the Last Year: Never true  Transportation Needs: No Transportation Needs (02/19/2022)   PRAPARE - Administrator, Civil Service (Medical): No    Lack of Transportation (Non-Medical): No  Physical Activity: Sufficiently Active (02/19/2022)   Exercise Vital Sign    Days of Exercise per Week: 5 days    Minutes of Exercise per Session: 30 min  Stress: No Stress Concern Present (02/19/2022)   [employer] of Occupational Health - Occupational Stress Questionnaire    Feeling of Stress : Not at all  Social Connections: Moderately Integrated (02/19/2022)   Social Connection and Isolation Panel [NHANES]    Frequency of Communication with Friends and Family: More than three times a week    Frequency of Social Gatherings with Friends and Family: More than three times a week    Attends Religious Services: More than 4 times per year    Active Member of [employer] or Organizations: Yes    Attends Banker Meetings: More than 4 times per year    Marital Status: Divorced  Intimate Partner Violence: Not At Risk (02/19/2022)   Humiliation, Afraid, Rape, and Kick questionnaire    Fear of  Current or Ex-Partner: No    Emotionally Abused: No    Physically Abused: No    Sexually Abused: No    Past Surgical History:  Procedure Laterality Date   ABDOMINAL HYSTERECTOMY     ANAL RECTAL MANOMETRY N/A 03/07/2017   Procedure: ANO RECTAL MANOMETRY;  Surgeon: Napoleon Form, MD;  Location: [facility];  Service: Endoscopy;  Laterality: N/A;   BUNIONECTOMY Left     Family History  Problem Relation Age of Onset   Diabetes Mother    Colon polyps Mother        over 150 polyps detected   Heart disease Father    Multiple myeloma Father    Emphysema Father    Heart attack Father    Liver cancer Maternal Grandmother        not primary source, spread to her liver   Heart  attack Maternal Grandfather    Emphysema Paternal Grandfather    Colon cancer Neg Hx    Esophageal cancer Neg Hx    Rectal cancer Neg Hx    Stomach cancer Neg Hx     Allergies  Allergen Reactions   Doxycycline Nausea And Vomiting   Hydrocodone     Nausea from cough syrup - ?? Very remote   Other     Dog, cat, nuts, grass, trees    Current Outpatient Medications on File Prior to Visit  Medication Sig Dispense Refill   albuterol (VENTOLIN HFA) 108 (90 Base) MCG/ACT inhaler Inhale 2 puffs into the lungs every 4 (four) hours as needed for wheezing or shortness of breath. 18 g 0   AMBULATORY NON FORMULARY MEDICATION Nitroglycerine ointment 0.125 %  Apply a pea sized amount internally four times daily. Dispense 30 GM 1 refill 30 g 1   ARIPiprazole (ABILIFY) 15 MG tablet Take 1 tablet (15 mg total) by mouth daily. 90 tablet 2   clotrimazole-betamethasone (LOTRISONE) cream Apply 1 Application topically daily. 30 g 0   famotidine (PEPCID) 40 MG tablet Take 1 tablet (40 mg total) by mouth daily. (Patient taking differently: Take 40 mg by mouth as needed for heartburn or indigestion.) 90 tablet 3   levothyroxine (SYNTHROID) 75 MCG tablet TAKE 1 TABLET(75 MCG) BY MOUTH DAILY 90 tablet 3   metoprolol succinate (TOPROL XL) 25 MG 24 hr tablet Take 0.5 tablets (12.5 mg total) by mouth at bedtime. 45 tablet 3   potassium chloride (KLOR-CON) 8 MEQ tablet TAKE 1 TABLET(8 MEQ) BY MOUTH DAILY 90 tablet 3   No current facility-administered medications on file prior to visit.    BP 116/70 (BP Location: Left Arm, Patient Position: Sitting, Cuff Size: Large)   Pulse (!) 56   Temp 99.3 F (37.4 C) (Oral)   Ht 5\' 5"  (1.651 m)   Wt 191 lb (86.6 kg)   SpO2 96%   BMI 31.78 kg/m chart      Objective:    Wt 191 lb (86.6 kg)   BMI 31.78 kg/m    Physical Exam Vitals and nursing note reviewed. Chaperone present: Sign language interpreter.  Constitutional:      Appearance: Normal appearance. She is  obese.  HENT:     Right Ear: Tympanic membrane, ear canal and external ear normal.     Left Ear: Ear canal and external ear normal.     Mouth/Throat:     Mouth: Mucous membranes are moist.  Cardiovascular:     Rate and Rhythm: Normal rate and regular rhythm.     Pulses:  Normal pulses.     Heart sounds: Normal heart sounds.  Pulmonary:     Effort: Pulmonary effort is normal.     Breath sounds: Normal breath sounds.  Musculoskeletal:        General: Normal range of motion.     Cervical back: Normal range of motion.  Skin:    General: Skin is warm and dry.  Neurological:     General: No focal deficit present.     Mental Status: She is alert and oriented to person, place, and time.  Psychiatric:        Mood and Affect: Mood normal.        Behavior: Behavior normal.     No results found for any visits on 07/25/23.      Assessment & Plan:   Problem List Items Addressed This Visit   None Visit Diagnoses       Acute bronchitis, unspecified organism    -  Primary     Cough, unspecified type           Meds ordered this encounter  Medications   promethazine-dextromethorphan (PROMETHAZINE-DM) 6.25-15 MG/5ML syrup    Sig: Take 5 mLs by mouth 4 (four) times daily as needed.    Dispense:  118 mL    Refill:  0   predniSONE (DELTASONE) 20 MG tablet    Sig: Take 2 tablets (40 mg total) by mouth daily with breakfast.    Dispense:  10 tablet    Refill:  0   amoxicillin-clavulanate (AUGMENTIN) 875-125 MG tablet    Sig: Take 1 tablet by mouth 2 (two) times daily.    Dispense:  14 tablet    Refill:  0   Call the office with any questions or concerns.  Recheck as scheduled and sooner as needed. No follow-ups on file.  Eulis Foster, FNP

## 2023-08-21 ENCOUNTER — Encounter: Payer: Self-pay | Admitting: Internal Medicine

## 2023-08-21 ENCOUNTER — Ambulatory Visit: Payer: Federal, State, Local not specified - PPO | Admitting: Internal Medicine

## 2023-09-18 ENCOUNTER — Encounter: Payer: Self-pay | Admitting: Internal Medicine

## 2023-09-18 ENCOUNTER — Ambulatory Visit: Admitting: Internal Medicine

## 2023-09-18 VITALS — BP 158/82 | HR 59 | Temp 98.1°F | Ht 65.0 in

## 2023-09-18 DIAGNOSIS — R030 Elevated blood-pressure reading, without diagnosis of hypertension: Secondary | ICD-10-CM

## 2023-09-18 DIAGNOSIS — R739 Hyperglycemia, unspecified: Secondary | ICD-10-CM

## 2023-09-18 DIAGNOSIS — I10 Essential (primary) hypertension: Secondary | ICD-10-CM | POA: Diagnosis not present

## 2023-09-18 DIAGNOSIS — R002 Palpitations: Secondary | ICD-10-CM | POA: Diagnosis not present

## 2023-09-18 DIAGNOSIS — J4520 Mild intermittent asthma, uncomplicated: Secondary | ICD-10-CM

## 2023-09-18 DIAGNOSIS — R600 Localized edema: Secondary | ICD-10-CM

## 2023-09-18 LAB — URINALYSIS, ROUTINE W REFLEX MICROSCOPIC
Bilirubin Urine: NEGATIVE
Ketones, ur: NEGATIVE
Nitrite: NEGATIVE
Specific Gravity, Urine: <=1.005 — AB (ref 1.000–1.030)
Total Protein, Urine: NEGATIVE
Urine Glucose: NEGATIVE
Urobilinogen, UA: 0.2 (ref 0.0–1.0)
pH: 7 (ref 5.0–8.0)

## 2023-09-18 LAB — COMPREHENSIVE METABOLIC PANEL WITH GFR
ALT: 26 U/L (ref 0–35)
AST: 38 U/L — ABNORMAL HIGH (ref 0–37)
Albumin: 4 g/dL (ref 3.5–5.2)
Alkaline Phosphatase: 96 U/L (ref 39–117)
BUN: 8 mg/dL (ref 6–23)
CO2: 29 meq/L (ref 19–32)
Calcium: 9.2 mg/dL (ref 8.4–10.5)
Chloride: 96 meq/L (ref 96–112)
Creatinine, Ser: 0.66 mg/dL (ref 0.40–1.20)
GFR: 94.13 mL/min (ref >60.00)
Glucose, Bld: 93 mg/dL (ref 70–99)
Potassium: 4.1 meq/L (ref 3.5–5.1)
Sodium: 134 meq/L — ABNORMAL LOW (ref 135–145)
Total Bilirubin: 0.8 mg/dL (ref 0.2–1.2)
Total Protein: 7.7 g/dL (ref 6.0–8.3)

## 2023-09-18 LAB — CBC WITH DIFFERENTIAL/PLATELET
Basophils Absolute: 0.1 10*3/uL (ref 0.0–0.1)
Basophils Relative: 0.9 % (ref 0.0–3.0)
Eosinophils Absolute: 0.3 10*3/uL (ref 0.0–0.7)
Eosinophils Relative: 2.6 % (ref 0.0–5.0)
HCT: 45.2 % (ref 36.0–46.0)
Hemoglobin: 15.2 g/dL — ABNORMAL HIGH (ref 12.0–15.0)
Lymphocytes Relative: 29.8 % (ref 12.0–46.0)
Lymphs Abs: 3.3 10*3/uL (ref 0.7–4.0)
MCHC: 33.7 g/dL (ref 30.0–36.0)
MCV: 88.9 fl (ref 78.0–100.0)
Monocytes Absolute: 0.9 10*3/uL (ref 0.1–1.0)
Monocytes Relative: 7.9 % (ref 3.0–12.0)
Neutro Abs: 6.6 10*3/uL (ref 1.4–7.7)
Neutrophils Relative %: 58.8 % (ref 43.0–77.0)
Platelets: 157 10*3/uL (ref 150.0–400.0)
RBC: 5.08 Mil/uL (ref 3.87–5.11)
RDW: 13.6 % (ref 11.5–15.5)
WBC: 11.2 10*3/uL — ABNORMAL HIGH (ref 4.0–10.5)

## 2023-09-18 LAB — TSH: TSH: 0.68 u[IU]/mL (ref 0.35–5.50)

## 2023-09-18 LAB — HEMOGLOBIN A1C: Hgb A1c MFr Bld: 6.6 % — ABNORMAL HIGH (ref 4.6–6.5)

## 2023-09-18 MED ORDER — ALBUTEROL SULFATE HFA 108 (90 BASE) MCG/ACT IN AERS: 2.0000 | INHALATION_SPRAY | RESPIRATORY_TRACT | 5 refills | Status: AC | PRN

## 2023-09-18 MED ORDER — POTASSIUM CHLORIDE ER 8 MEQ PO TBCR: EXTENDED_RELEASE_TABLET | ORAL | 3 refills | Status: DC

## 2023-09-18 NOTE — Assessment & Plan Note (Signed)
 PVCs are asymptomatic

## 2023-09-18 NOTE — Assessment & Plan Note (Signed)
 Better

## 2023-09-18 NOTE — Assessment & Plan Note (Signed)
 Check A1c.

## 2023-09-18 NOTE — Progress Notes (Signed)
 Subjective:  Patient ID: Heidi Good, female    DOB: December 22, 1960  Age: 63 y.o. MRN: [id number]  CC: Medication Refill (Medication refill, follow up. Notes that the famotidine  40 mg was too strong, and the 20 mg did not work at all.  )   HPI Heidi Good presents for HTN, palpitations, asthma  Outpatient Medications Prior to Visit  Medication Sig Dispense Refill   AMBULATORY NON FORMULARY MEDICATION Nitroglycerine ointment 0.125 %  Apply a pea sized amount internally four times daily. Dispense 30 GM 1 refill 30 g 1   ARIPiprazole  (ABILIFY ) 15 MG tablet Take 1 tablet (15 mg total) by mouth daily. 90 tablet 2   clotrimazole -betamethasone  (LOTRISONE ) cream Apply 1 Application topically daily. 30 g 0   famotidine  (PEPCID ) 40 MG tablet Take 1 tablet (40 mg total) by mouth daily. (Patient taking differently: Take 40 mg by mouth as needed for heartburn or indigestion.) 90 tablet 3   levothyroxine  (SYNTHROID ) 75 MCG tablet TAKE 1 TABLET(75 MCG) BY MOUTH DAILY 90 tablet 3   metoprolol  succinate (TOPROL  XL) 25 MG 24 hr tablet Take 0.5 tablets (12.5 mg total) by mouth at bedtime. 45 tablet 3   albuterol  (VENTOLIN  HFA) 108 (90 Base) MCG/ACT inhaler Inhale 2 puffs into the lungs every 4 (four) hours as needed for wheezing or shortness of breath. 18 g 0   amoxicillin -clavulanate (AUGMENTIN ) 875-125 MG tablet Take 1 tablet by mouth 2 (two) times daily. 14 tablet 0   potassium chloride  (KLOR-CON ) 8 MEQ tablet TAKE 1 TABLET(8 MEQ) BY MOUTH DAILY 90 tablet 3   predniSONE  (DELTASONE ) 20 MG tablet Take 2 tablets (40 mg total) by mouth daily with breakfast. 10 tablet 0   promethazine -dextromethorphan (PROMETHAZINE -DM) 6.25-15 MG/5ML syrup Take 5 mLs by mouth 4 (four) times daily as needed. 118 mL 0   No facility-administered medications prior to visit.    ROS: Review of Systems  Constitutional:  Positive for fatigue. Negative for activity change, appetite change, chills and unexpected weight change.   HENT:  Negative for congestion, mouth sores and sinus pressure.   Eyes:  Negative for visual disturbance.  Respiratory:  Negative for cough, chest tightness and wheezing.   Cardiovascular:  Positive for palpitations. Negative for leg swelling.  Gastrointestinal:  Negative for abdominal pain and nausea.  Genitourinary:  Negative for difficulty urinating, frequency and vaginal pain.  Musculoskeletal:  Negative for back pain and gait problem.  Skin:  Negative for pallor and rash.  Neurological:  Negative for dizziness, tremors, weakness, numbness and headaches.  Psychiatric/Behavioral:  Negative for confusion, sleep disturbance and suicidal ideas.     Objective:  BP (!) 158/82   Pulse (!) 59   Temp 98.1 F (36.7 C)   Ht 5\' 5"  (1.651 m)   SpO2 96%   BMI 31.78 kg/m   BP Readings from Last 3 Encounters:  09/18/23 (!) 158/82  07/25/23 116/70  07/23/23 126/72    Wt Readings from Last 3 Encounters:  07/25/23 191 lb (86.6 kg)  07/23/23 194 lb 3.2 oz (88.1 kg)  07/14/23 192 lb (87.1 kg)    Physical Exam Constitutional:      General: She is not in acute distress.    Appearance: She is well-developed. She is obese.  HENT:     Head: Normocephalic.     Right Ear: External ear normal.     Left Ear: External ear normal.     Nose: Nose normal.  Eyes:     General:  Right eye: No discharge.        Left eye: No discharge.     Conjunctiva/sclera: Conjunctivae normal.     Pupils: Pupils are equal, round, and reactive to light.  Neck:     Thyroid : No thyromegaly.     Vascular: No JVD.     Trachea: No tracheal deviation.  Cardiovascular:     Rate and Rhythm: Normal rate. Rhythm irregular.     Heart sounds: Normal heart sounds.  Pulmonary:     Effort: No respiratory distress.     Breath sounds: No stridor. No wheezing.  Abdominal:     General: Bowel sounds are normal. There is no distension.     Palpations: Abdomen is soft. There is no mass.     Tenderness: There is no  abdominal tenderness. There is no guarding or rebound.  Musculoskeletal:        General: No tenderness.     Cervical back: Normal range of motion and neck supple. No rigidity.  Lymphadenopathy:     Cervical: No cervical adenopathy.  Skin:    Findings: No erythema or rash.  Neurological:     Mental Status: She is oriented to person, place, and time.     Cranial Nerves: No cranial nerve deficit.     Motor: No abnormal muscle tone.     Coordination: Coordination normal.     Deep Tendon Reflexes: Reflexes normal.  Psychiatric:        Mood and Affect: Mood normal.        Behavior: Behavior normal.        Thought Content: Thought content normal.        Judgment: Judgment normal.     Lab Results  Component Value Date   WBC 10.2 08/21/2022   HGB 15.7 (H) 08/21/2022   HCT 45.7 08/21/2022   PLT 149.0 (L) 08/21/2022   GLUCOSE 92 02/20/2023   CHOL 216 (H) 08/21/2022   TRIG 171.0 (H) 08/21/2022   HDL 52.80 08/21/2022   LDLDIRECT 52.0 03/07/2020   LDLCALC 129 (H) 08/21/2022   ALT 21 02/20/2023   AST 28 02/20/2023   NA 138 02/20/2023   K 3.9 02/20/2023   CL 99 02/20/2023   CREATININE 0.71 02/20/2023   BUN 7 02/20/2023   CO2 29 02/20/2023   TSH 0.50 02/20/2023   HGBA1C 5.9 02/20/2023    MM 3D SCREENING MAMMOGRAM BILATERAL BREAST Result Date: 12/06/2022 CLINICAL DATA:  Screening. EXAM: DIGITAL SCREENING BILATERAL MAMMOGRAM WITH TOMOSYNTHESIS AND CAD TECHNIQUE: Bilateral screening digital craniocaudal and mediolateral oblique mammograms were obtained. Bilateral screening digital breast tomosynthesis was performed. The images were evaluated with computer-aided detection. COMPARISON:  Previous exam(s). ACR Breast Density Category c: The breasts are heterogeneously dense, which may obscure small masses. FINDINGS: There are no findings suspicious for malignancy. IMPRESSION: No mammographic evidence of malignancy. A result letter of this screening mammogram will be mailed directly to the  patient. RECOMMENDATION: Screening mammogram in one year. (Code:SM-B-01Y) BI-RADS CATEGORY  1: Negative. Electronically Signed   By: Dina  Arceo M.D.   On: 12/06/2022 08:47    Assessment & Plan:   Problem List Items Addressed This Visit     Asthma, mild intermittent   Relevant Medications   albuterol  (VENTOLIN  HFA) 108 (90 Base) MCG/ACT inhaler   Palpitations - Primary   PVCs are asymptomatic      Relevant Orders   CBC with Differential/Platelet   Comprehensive metabolic panel with GFR   Hemoglobin A1c   TSH  Urinalysis   Hyperglycemia   Check A1c      Relevant Orders   Comprehensive metabolic panel with GFR   Hemoglobin A1c   Edema   Better      Elevated BP without diagnosis of hypertension   Better       Hypertension   Relevant Orders   CBC with Differential/Platelet   Comprehensive metabolic panel with GFR   Hemoglobin A1c   TSH   Urinalysis      Meds ordered this encounter  Medications   albuterol  (VENTOLIN  HFA) 108 (90 Base) MCG/ACT inhaler    Sig: Inhale 2 puffs into the lungs every 4 (four) hours as needed for wheezing or shortness of breath.    Dispense:  8 g    Refill:  5   potassium chloride  (KLOR-CON ) 8 MEQ tablet    Sig: TAKE 1 TABLET(8 MEQ) BY MOUTH DAILY    Dispense:  90 tablet    Refill:  3      Follow-up: Return in about 6 months (around 03/20/2024) for a follow-up visit.  Anitra Barn, MD

## 2023-09-24 ENCOUNTER — Encounter: Payer: Self-pay | Admitting: Psychiatry

## 2023-09-24 ENCOUNTER — Ambulatory Visit: Payer: Medicare Other | Admitting: Psychiatry

## 2023-09-24 DIAGNOSIS — F5105 Insomnia due to other mental disorder: Secondary | ICD-10-CM

## 2023-09-24 DIAGNOSIS — F99 Mental disorder, not otherwise specified: Secondary | ICD-10-CM | POA: Diagnosis not present

## 2023-09-24 DIAGNOSIS — F200 Paranoid schizophrenia: Secondary | ICD-10-CM | POA: Diagnosis not present

## 2023-09-24 MED ORDER — ARIPIPRAZOLE 15 MG PO TABS: 15.0000 mg | ORAL_TABLET | Freq: Every day | ORAL | 2 refills | Status: AC

## 2023-09-24 NOTE — Progress Notes (Signed)
 Heidi Good [id number] 03/04/1961 63 y.o.  Subjective:   Patient ID:  Heidi Good is a 63 y.o. (DOB Jan 03, 1961) female.  Chief Complaint:  Chief Complaint  Patient presents with   Follow-up      Heidi Good presents to the office today for follow-up of schizophrenia.  seen November, 2020.  She continues on Abilify  15 mg daily and Xanax  is the only psychiatric meds and no meds were changed.  Patient seen today the with the assistance of a translator due to her deafness.  Stress M declining health and she feels more nervous and worried.  Helps mother.  Takes alprazolam  occ with little benefit.  Sleep is affected by care of mother.     M gets confused and dx dementia and sleeping a lot. B comes and helps.  She pays the bills and not the mother.    No longer has funny feelings and doesn't need the Xanax .  Denies feeling afraid at home.  B will move in when M passes away.  Has several dogs.  [place] Shepherds make her feel safer.  Abilify  15 mg increase helped her.     In November bothered bc could see something and feel something walk by outside and dog acted funny.  Then brother came over and checked things.  She felt someone was walking around outside.  Says this happened 7 times by mid DEC.  She feels in danger from this incidents.  Never saw anyone but believed there was someone there.  Police came by and didn't find anything.  Thinks it's an old friend.  M having problems with dementia.  Pt takes care of her.  Pt helps her with meds and cooks for her and helps bathe hers.  Rare Xanax  is less helpful.  Patient reports stable mood and denies depressed or irritable moods.  Patient denies any recent difficulty with anxiety except as noted. No fear.  Patient denies difficulty with sleep initiation or maintenance. Denies appetite disturbance.  Patient reports that energy and motivation have been good.  Patient denies any difficulty with concentration.  Patient denies any suicidal  ideation.  15 mg Abilify  helped her feel better, calmer than did the 10 mg Abilify .  No SE and claims compliance.  Denies voices, depression, anger on this dosage.  Sleep good 8 hours.  No drowsiness. Watches news.  05/24/20 appt noted: seen with ASL interpreter M in Hospice with poor health.  Not depressed but sad.  Not anxious.   B is helping some.   Mother's sister Heidi Good is helping.  F died 9 years ago.  Loves Mother.  Will live alone in the house after her mother dies. Patient reports stable mood and denies depressed or irritable moods.  Patient denies any recent difficulty with anxiety.  Patient denies difficulty with sleep initiation or maintenance. Denies appetite disturbance.  Patient reports that energy and motivation have been good.  Patient denies any difficulty with concentration.  Patient denies any suicidal ideation. No fear. Plan: No med changes  09/20/2020 appointment with the following noted: Seen with interpreter Healthy no Covid. M died. Going through process of grief.  Misses mother.  B helping a lot. No fearful or unusual anxiety.  2 dogs. I feel at peace.  No excess crying.  Sleep is good.  No problems with Abilify  15 mg daily. Rare Xanax  0.5-1 mg. Pt drives without problems. Plan: No med changes  01/24/2021 appointment with the following noted: Rare Xanax  with little anxiety.  Peace after mother died. Takes Abilify  consistently 15 mg nightly. Sleeps well after 8 hours. No sig fear nor anxiety.  B checks on her.  Supportive local community.  Has dogs. No SE.   Painting and doing things around the house.  No problems driving. Plan: no med changes  07/26/21 appt noted: seen with interpretor Fine.  Relaxed.  No problems with meds. Dog 110 yo with cancer. Put to sleep.   Consistent with meds Abilify  15 mg.  Has 4 dogs now.  Takes care of them herself. Stopped Xanax  bc not needed. Good sleep and energy. B helps her.  His wife takes her out.  B 57 is Arboriculturist.    Plan: Continue current meds Abilify  15 mg daily and rare Xanax  0.5 to 1 mg as needed anxiety  9/19 /2023 appointment noted:  seen with translator as always Consistent with meds.  Been stable. B  and sister in law are all supportive.   Eats fruits and some vegetables.  So likes this time of year. Eats Chinsese food. And some meats. No sig anxiety or fear and thinks it is due to the benefit of the meds. Tries not to sleep to be able to sleep at night.  Minimal alprazolam . Awakens 630 AM and gets 8 hours of sleep .  Used to oversleep but not anymore. Consitent with Abilify  15 without SE Plan no changes  12/26/22 appt noted: Has cardiologist.  Got metoprolol  ER 35 mg and feels better. Consistent with meds. No SE with meds.   I feel stable.  No fear.  Minimal anxiety.   B checks up on her and friends check on her.  Nephew also close to her.  B good health. B Emergency planning/management officer.  Sleep really good.  Heart palpitations resolved.   Drives and takes care for herself.  Takes care of own errands.   3 dogs to care for.    09/24/23 appt noted:   patient is deaf and seen with an interpreter.  Med: Abilify  15 only psych med Consistent.  No SE. No fears.  Recent PE good.   Trying to eat better with vegetables and fruit.   No sig dep nor anxiety.  "Just chilling".  Life feels good and independent.   No concerns with meds.   Sleep good.   Shops .  But B comes to check on her daily about lunch.  He helps a lot.   Dog died and no plans for more as of October.    Other psychiatric medications tried include  perphenazine which she did not like, loxapine which she did not like, and Saphris .  She complained that it gave her strange feeling under the tongue.  She has taken other psychiatric medications in the past but she does not know the names of them and we have not been able to get the records. Abilify  15 benefit.  Review of Systems:  Review of Systems  HENT:         Deaf  Cardiovascular:   Positive for palpitations. Negative for chest pain.  Neurological:  Negative for tremors.  Psychiatric/Behavioral:  Negative for agitation, behavioral problems, confusion, decreased concentration, dysphoric mood, hallucinations, self-injury, sleep disturbance and suicidal ideas. The patient is not nervous/anxious and is not hyperactive.     Medications: I have reviewed the patient's current medications.  Current Outpatient Medications  Medication Sig Dispense Refill   albuterol  (VENTOLIN  HFA) 108 (90 Base) MCG/ACT inhaler Inhale 2 puffs into the lungs every 4 (four)  hours as needed for wheezing or shortness of breath. 8 g 5   AMBULATORY NON FORMULARY MEDICATION Nitroglycerine ointment 0.125 %  Apply a pea sized amount internally four times daily. Dispense 30 GM 1 refill 30 g 1   clotrimazole -betamethasone  (LOTRISONE ) cream Apply 1 Application topically daily. 30 g 0   famotidine  (PEPCID ) 40 MG tablet Take 1 tablet (40 mg total) by mouth daily. (Patient taking differently: Take 40 mg by mouth as needed for heartburn or indigestion.) 90 tablet 3   levothyroxine  (SYNTHROID ) 75 MCG tablet TAKE 1 TABLET(75 MCG) BY MOUTH DAILY 90 tablet 3   metoprolol  succinate (TOPROL  XL) 25 MG 24 hr tablet Take 0.5 tablets (12.5 mg total) by mouth at bedtime. 45 tablet 3   potassium chloride  (KLOR-CON ) 8 MEQ tablet TAKE 1 TABLET(8 MEQ) BY MOUTH DAILY 90 tablet 3   ARIPiprazole  (ABILIFY ) 15 MG tablet Take 1 tablet (15 mg total) by mouth daily. 90 tablet 2   No current facility-administered medications for this visit.    Medication Side Effects: None  Allergies:  Allergies  Allergen Reactions   Doxycycline  Nausea And Vomiting   Hydrocodone      Nausea from cough syrup - ?? Very remote   Other     Dog, cat, nuts, grass, trees    Past Medical History:  Diagnosis Date   Anxiety    Constipation    Deafness    Depression    Diabetes mellitus (HCC)    Gastroparesis    Hyperlipemia    Hypertension     Hypothyroidism     Family History  Problem Relation Age of Onset   Diabetes Mother    Colon polyps Mother        over 150 polyps detected   Heart disease Father    Multiple myeloma Father    Emphysema Father    Heart attack Father    Liver cancer Maternal Grandmother        not primary source, spread to her liver   Heart attack Maternal Grandfather    Emphysema Paternal Grandfather    Colon cancer Neg Hx    Esophageal cancer Neg Hx    Rectal cancer Neg Hx    Stomach cancer Neg Hx     Social History   Socioeconomic History   Marital status: Divorced    Spouse name: Not on file   Number of children: 0   Years of education: Not on file   Highest education level: Not on file  Occupational History   Occupation: disablied    Employer: RETIRED  Tobacco Use   Smoking status: Never   Smokeless tobacco: Never  Vaping Use   Vaping status: Never Used  Substance and Sexual Activity   Alcohol use: Never   Drug use: Never   Sexual activity: Not on file  Other Topics Concern   Not on file  Social History Narrative   Not on file   Social Drivers of Health   Financial Resource Strain: Low Risk  (02/19/2022)   Overall Financial Resource Strain (CARDIA)    Difficulty of Paying Living Expenses: Not hard at all  Food Insecurity: No Food Insecurity (02/19/2022)   Hunger Vital Sign    Worried About Running Out of Food in the Last Year: Never true    Ran Out of Food in the Last Year: Never true  Transportation Needs: No Transportation Needs (02/19/2022)   PRAPARE - Transportation    Lack of Transportation (Medical): No    Lack  of Transportation (Non-Medical): No  Physical Activity: Sufficiently Active (02/19/2022)   Exercise Vital Sign    Days of Exercise per Week: 5 days    Minutes of Exercise per Session: 30 min  Stress: No Stress Concern Present (02/19/2022)   [employer] of Occupational Health - Occupational Stress Questionnaire    Feeling of Stress : Not at all   Social Connections: Moderately Integrated (02/19/2022)   Social Connection and Isolation Panel [NHANES]    Frequency of Communication with Friends and Family: More than three times a week    Frequency of Social Gatherings with Friends and Family: More than three times a week    Attends Religious Services: More than 4 times per year    Active Member of [employer] or Organizations: Yes    Attends Banker Meetings: More than 4 times per year    Marital Status: Divorced  Intimate Partner Violence: Not At Risk (02/19/2022)   Humiliation, Afraid, Rape, and Kick questionnaire    Fear of Current or Ex-Partner: No    Emotionally Abused: No    Physically Abused: No    Sexually Abused: No   She drives.  Still lives with her neighbor.  Past Medical History, Surgical history, Social history, and Family history were reviewed and updated as appropriate.   Please see review of systems for further details on the patient's review from today.   Objective:   Physical Exam:  There were no vitals taken for this visit.  Physical Exam Constitutional:      General: She is not in acute distress.    Appearance: She is well-developed.  Musculoskeletal:        General: No deformity.  Neurological:     Mental Status: She is alert and oriented to person, place, and time.     Motor: No tremor.     Coordination: Coordination normal.     Gait: Gait normal.  Psychiatric:        Attention and Perception: She is attentive. She does not perceive auditory hallucinations.        Mood and Affect: Mood is not anxious or depressed. Affect is not labile or inappropriate.        Speech: Speech normal.        Behavior: Behavior normal. Behavior is not agitated, aggressive or hyperactive. Behavior is cooperative.        Thought Content: Thought content is not paranoid or delusional. Thought content does not include homicidal or suicidal ideation. Thought content does not include suicidal plan.        Cognition  and Memory: Cognition normal.        Judgment: Judgment normal.     Comments: Insight fair. No auditory or visual hallucinations. No delusions.  Pleasant     Lab Review:     Component Value Date/Time   NA 134 (L) 09/18/2023 1438   K 4.1 09/18/2023 1438   CL 96 09/18/2023 1438   CO2 29 09/18/2023 1438   GLUCOSE 93 09/18/2023 1438   BUN 8 09/18/2023 1438   CREATININE 0.66 09/18/2023 1438   CALCIUM 9.2 09/18/2023 1438   PROT 7.7 09/18/2023 1438   ALBUMIN 4.0 09/18/2023 1438   AST 38 (H) 09/18/2023 1438   ALT 26 09/18/2023 1438   ALKPHOS 96 09/18/2023 1438   BILITOT 0.8 09/18/2023 1438   GFRNONAA 95 07/11/2008 1616   GFRAA 115 07/11/2008 1616       Component Value Date/Time   WBC 11.2 (H) 09/18/2023 1438  RBC 5.08 09/18/2023 1438   HGB 15.2 (H) 09/18/2023 1438   HCT 45.2 09/18/2023 1438   PLT 157.0 09/18/2023 1438   MCV 88.9 09/18/2023 1438   MCHC 33.7 09/18/2023 1438   RDW 13.6 09/18/2023 1438   LYMPHSABS 3.3 09/18/2023 1438   MONOABS 0.9 09/18/2023 1438   EOSABS 0.3 09/18/2023 1438   BASOSABS 0.1 09/18/2023 1438    No results found for: "POCLITH", "LITHIUM"   No results found for: "PHENYTOIN", "PHENOBARB", "VALPROATE", "CBMZ"   .res Assessment: Plan:    Elenah was seen today for follow-up.  Diagnoses and all orders for this visit:  Paranoid schizophrenia (HCC) -     ARIPiprazole  (ABILIFY ) 15 MG tablet; Take 1 tablet (15 mg total) by mouth daily.  Insomnia due to other mental disorder    30 min of face to face time with patient and an interpretor for 30 mins was spent on counseling and coordination of care. She is agreed to continue Abilify  15 mg which she has taken for many years in the past to good effect.  She had a brief period of time where she was convinced it was not the right medicine for her.  We were not able to find any other medications that she felt satisfied taking.  No active psychosis for years.  She has a history of repeated paranoid  psychotic episodes.  During some of these she could become agitated and aggressive with her mother.  She agrees that this is a good medicine for her  No SE.  We discussed potential metabolic side effects associated with atypical antipsychotics, as well as potential risk for movement side effects. Advised pt to contact office if movement side effects occur.  Good response with meds.  No concerns with it.    No paranoid feelings with good response to Abilify  15 mg.  No change indicated..  needs it longterm to prevent recurrence of psychosis.   No AIM  Her labs are followed by her primary care doctor.  Dx OSA but not using CPAP.  Encouraged to do so.  Follow-up 6-9  months bc extended stability  Heidi Beat MD, DFAPA  Please see After Visit Summary for patient specific instructions.  Future Appointments  Date Time Provider Department Center  11/11/2023 10:00 AM Lenise Quince, MD [facility]  03/22/2024  2:00 PM Plotnikov, Oakley Bellman, MD [facility] None  06/28/2024 11:00 AM Cottle, Kennedy Peabody., MD [facility] None    No orders of the defined types were placed in this encounter.     -------------------------------

## 2023-11-05 NOTE — Progress Notes (Signed)
 HPI: FU palpitations/PVCs.  History is obtained with the assistance of an interpreter.  Nuclear study August 2018 showed ejection fraction 55% with no ischemia or infarction.  Noted to have PVCs in the past.  Echocardiogram June 2022 showed normal LV function.  Since last seen she denies dyspnea, chest pain or syncope.  Occasional brief flutters but no sustained palpitations.  Current Outpatient Medications  Medication Sig Dispense Refill   albuterol  (VENTOLIN  HFA) 108 (90 Base) MCG/ACT inhaler Inhale 2 puffs into the lungs every 4 (four) hours as needed for wheezing or shortness of breath. 8 g 5   AMBULATORY NON FORMULARY MEDICATION Nitroglycerine ointment 0.125 %  Apply a pea sized amount internally four times daily. Dispense 30 GM 1 refill 30 g 1   ARIPiprazole  (ABILIFY ) 15 MG tablet Take 1 tablet (15 mg total) by mouth daily. 90 tablet 2   clotrimazole -betamethasone  (LOTRISONE ) cream Apply 1 Application topically daily. 30 g 0   famotidine  (PEPCID ) 40 MG tablet Take 1 tablet (40 mg total) by mouth daily. (Patient taking differently: Take 40 mg by mouth as needed for heartburn or indigestion.) 90 tablet 3   levothyroxine  (SYNTHROID ) 75 MCG tablet TAKE 1 TABLET(75 MCG) BY MOUTH DAILY 90 tablet 3   metoprolol  succinate (TOPROL  XL) 25 MG 24 hr tablet Take 0.5 tablets (12.5 mg total) by mouth at bedtime. 45 tablet 3   potassium chloride  (KLOR-CON ) 8 MEQ tablet TAKE 1 TABLET(8 MEQ) BY MOUTH DAILY 90 tablet 3   No current facility-administered medications for this visit.     Past Medical History:  Diagnosis Date   Anxiety    Constipation    Deafness    Depression    Diabetes mellitus (HCC)    Gastroparesis    Hyperlipemia    Hypertension    Hypothyroidism     Past Surgical History:  Procedure Laterality Date   ABDOMINAL HYSTERECTOMY     ANAL RECTAL MANOMETRY N/A 03/07/2017   Procedure: ANO RECTAL MANOMETRY;  Surgeon: Nandigam, Kavitha V, MD;  Location: [facility];  Service:  Endoscopy;  Laterality: N/A;   BUNIONECTOMY Left     Social History   Socioeconomic History   Marital status: Divorced    Spouse name: Not on file   Number of children: 0   Years of education: Not on file   Highest education level: Not on file  Occupational History   Occupation: disablied    Employer: RETIRED  Tobacco Use   Smoking status: Never   Smokeless tobacco: Never  Vaping Use   Vaping status: Never Used  Substance and Sexual Activity   Alcohol use: Never   Drug use: Never   Sexual activity: Not on file  Other Topics Concern   Not on file  Social History Narrative   Not on file   Social Drivers of Health   Financial Resource Strain: Low Risk  (02/19/2022)   Overall Financial Resource Strain (CARDIA)    Difficulty of Paying Living Expenses: Not hard at all  Food Insecurity: No Food Insecurity (02/19/2022)   Hunger Vital Sign    Worried About Running Out of Food in the Last Year: Never true    Ran Out of Food in the Last Year: Never true  Transportation Needs: No Transportation Needs (02/19/2022)   PRAPARE - Administrator, Civil Service (Medical): No    Lack of Transportation (Non-Medical): No  Physical Activity: Sufficiently Active (02/19/2022)   Exercise Vital Sign    Days of Exercise  per Week: 5 days    Minutes of Exercise per Session: 30 min  Stress: No Stress Concern Present (02/19/2022)   [employer] of Occupational Health - Occupational Stress Questionnaire    Feeling of Stress : Not at all  Social Connections: Moderately Integrated (02/19/2022)   Social Connection and Isolation Panel    Frequency of Communication with Friends and Family: More than three times a week    Frequency of Social Gatherings with Friends and Family: More than three times a week    Attends Religious Services: More than 4 times per year    Active Member of [employer] or Organizations: Yes    Attends Engineer, structural: More than 4 times per year     Marital Status: Divorced  Intimate Partner Violence: Not At Risk (02/19/2022)   Humiliation, Afraid, Rape, and Kick questionnaire    Fear of Current or Ex-Partner: No    Emotionally Abused: No    Physically Abused: No    Sexually Abused: No    Family History  Problem Relation Age of Onset   Diabetes Mother    Colon polyps Mother        over 150 polyps detected   Heart disease Father    Multiple myeloma Father    Emphysema Father    Heart attack Father    Liver cancer Maternal Grandmother        not primary source, spread to her liver   Heart attack Maternal Grandfather    Emphysema Paternal Grandfather    Colon cancer Neg Hx    Esophageal cancer Neg Hx    Rectal cancer Neg Hx    Stomach cancer Neg Hx     ROS: no fevers or chills, productive cough, hemoptysis, dysphasia, odynophagia, melena, hematochezia, dysuria, hematuria, rash, seizure activity, orthopnea, PND, pedal edema, claudication. Remaining systems are negative.  Physical Exam: Well-developed well-nourished in no acute distress.  Skin is warm and dry.  HEENT is normal.  Neck is supple.  Chest is clear to auscultation with normal expansion.  Cardiovascular exam is regular rate and rhythm.  Abdominal exam nontender or distended. No masses palpated. Extremities show no edema. neuro grossly intact   A/P  1 palpitations/PVCs-previous echocardiogram showed normal LV function.  Occasional flutters but improved compared to previous.  Increase Toprol  to 25 mg nightly and follow-up.  2 hyperlipidemia-previously declined calcium score due to expense.  3 hypertension-patient's blood pressure is elevated; I am increasing Toprol  as outlined above.  I will ask her to follow her blood pressure at home and we will add medications as needed.  Redell Shallow, MD

## 2023-11-07 ENCOUNTER — Telehealth: Payer: Self-pay | Admitting: Cardiology

## 2023-11-07 NOTE — Telephone Encounter (Signed)
 Patient requesting Estradiol  1mg  tab not on profile.

## 2023-11-07 NOTE — Telephone Encounter (Unsigned)
 Copied from CRM [id number]. Topic: Clinical - Medication Refill >> Nov 07, 2023  8:04 AM Mercedes MATSU wrote: Medication:  estradiol  (ESTRACE ) 1 MG tablet  Has the patient contacted their pharmacy? Yes (Agent: If no, request that the patient contact the pharmacy for the refill. If patient does not wish to contact the pharmacy document the reason why and proceed with request.) (Agent: If yes, when and what did the pharmacy advise?)  This is the patient's preferred pharmacy:  [id number] - [place], [place] - [address] AT [facility] OF [address]  [place] [address] Phone: [phone] Fax: [phone]  Is this the correct pharmacy for this prescription? Yes If no, delete pharmacy and type the correct one.   Has the prescription been filled recently? Yes  Is the patient out of the medication? Yes  Has the patient been seen for an appointment in the last year OR does the patient have an upcoming appointment? Yes  Can we respond through MyChart? Yes  Agent: Please be advised that Rx refills may take up to 3 business days. We ask that you follow-up with your pharmacy.

## 2023-11-11 ENCOUNTER — Ambulatory Visit: Attending: Cardiology | Admitting: Cardiology

## 2023-11-11 ENCOUNTER — Encounter: Payer: Self-pay | Admitting: Cardiology

## 2023-11-11 VITALS — BP 160/80 | HR 67 | Ht 65.0 in | Wt 192.0 lb

## 2023-11-11 DIAGNOSIS — I493 Ventricular premature depolarization: Secondary | ICD-10-CM | POA: Diagnosis not present

## 2023-11-11 DIAGNOSIS — R002 Palpitations: Secondary | ICD-10-CM | POA: Diagnosis not present

## 2023-11-11 DIAGNOSIS — I10 Essential (primary) hypertension: Secondary | ICD-10-CM | POA: Diagnosis not present

## 2023-11-11 MED ORDER — METOPROLOL SUCCINATE ER 25 MG PO TB24: 25.0000 mg | ORAL_TABLET | Freq: Every day | ORAL | 3 refills | Status: AC

## 2023-11-11 NOTE — Patient Instructions (Signed)
 Medication Instructions:   INCREASE METOPROLOL  TO 25 MG ONCE DAILY AT BEDTIME  *If you need a refill on your cardiac medications before your next appointment, please call your pharmacy*  Follow-Up: At [facility], you and your health needs are our priority.  As part of our continuing mission to provide you with exceptional heart care, our providers are all part of one team.  This team includes your primary Cardiologist (physician) and Advanced Practice Providers or APPs (Physician Assistants and Nurse Practitioners) who all work together to provide you with the care you need, when you need it.  Your next appointment:   12 month(s)  Provider:   Redell Shallow, MD

## 2023-11-13 ENCOUNTER — Telehealth: Payer: Self-pay

## 2023-11-13 NOTE — Telephone Encounter (Signed)
 Copied from CRM [id number]. Topic: Clinical - Medication Question >> Nov 13, 2023  3:10 PM Shereese L wrote: Reason for CRM: patient is trying to get a refill on estradiol  but medication is not on med list and needs a nurse to call back

## 2023-11-13 NOTE — Telephone Encounter (Signed)
Please schedule office visit. Thanks 

## 2023-11-20 NOTE — Telephone Encounter (Signed)
 Jakita should probably contact her gynecologist in respect to the estradiol  refill.  Thank you

## 2023-11-28 ENCOUNTER — Encounter: Payer: Self-pay | Admitting: Advanced Practice Midwife

## 2023-12-18 DIAGNOSIS — L820 Inflamed seborrheic keratosis: Secondary | ICD-10-CM | POA: Diagnosis not present

## 2023-12-18 DIAGNOSIS — L821 Other seborrheic keratosis: Secondary | ICD-10-CM | POA: Diagnosis not present

## 2023-12-18 DIAGNOSIS — D1801 Hemangioma of skin and subcutaneous tissue: Secondary | ICD-10-CM | POA: Diagnosis not present

## 2023-12-18 DIAGNOSIS — L814 Other melanin hyperpigmentation: Secondary | ICD-10-CM | POA: Diagnosis not present

## 2024-01-05 ENCOUNTER — Other Ambulatory Visit: Payer: Self-pay | Admitting: Internal Medicine

## 2024-01-05 DIAGNOSIS — Z1231 Encounter for screening mammogram for malignant neoplasm of breast: Secondary | ICD-10-CM

## 2024-01-06 ENCOUNTER — Ambulatory Visit (INDEPENDENT_AMBULATORY_CARE_PROVIDER_SITE_OTHER): Admitting: Podiatry

## 2024-01-06 VITALS — Ht 65.0 in | Wt 192.0 lb

## 2024-01-06 DIAGNOSIS — B070 Plantar wart: Secondary | ICD-10-CM

## 2024-01-06 NOTE — Patient Instructions (Signed)
 Look for salicylic acid wart remover pads, cream or liquid.  It is available over-the-counter at the pharmacy in the footcare section.  Regular strength is usually 17 to 20%, the maximum strength is 40%, this may be more effective for the bottom of the foot than the regular strength if you can find it.  [employer] will also carry it.  Common brands are Dr. Heriberto and Compound W

## 2024-01-06 NOTE — Progress Notes (Signed)
  Subjective:  Patient ID: Heidi Good, female    DOB: 1961-04-20,  MRN: [id number]  Chief Complaint  Patient presents with   Callouses    RM 3 Patient is here for right foot callus/plantar wart. Patient states callus/plantar wart has been present for the past month. Area is sore to the touch.    63 y.o. female presents with the above complaint. History confirmed with patient.   Objective:  Physical Exam: warm, good capillary refill, no trophic changes or ulcerative lesions, normal DP and PT pulses, normal sensory exam, and submetatarsal 5 verruca plantaris.  Assessment:   1. Verruca plantaris      Plan:  Patient was evaluated and treated and all questions answered.  Discussed etiology and treatment of verruca plantaris in detail with the patient as well as multiple treatment options including blistering agents, chemotherapeutic agents, surgical excision, laser therapy and the indications and roles of the above.  Today, recommended treatment with salicylic acid as noted in procedure note below.  I recommended she utilize this at home as well.  Follow-up in 6 weeks for reevaluation  Procedure: Destruction of Lesion Location: Right foot submetatarsal Instrumentation: 15 blade. Technique: Debridement of lesion to petechial bleeding. Aperture pad applied around lesion. Small amount of salinocaine applied to the base of the lesion. Dressing: Dry, sterile, compression dressing. Disposition: Patient tolerated procedure well. Advised to leave dressing on for 24-72 hours. Thereafter patient to wash the area with soap and water and applied band-aid.    Return in about 6 weeks (around 02/17/2024) for wart treatment.

## 2024-01-21 ENCOUNTER — Ambulatory Visit
Admission: RE | Admit: 2024-01-21 | Discharge: 2024-01-21 | Disposition: A | Discharge status: Home / Self Care | Source: Ambulatory Visit | Attending: Internal Medicine | Admitting: Internal Medicine

## 2024-01-21 DIAGNOSIS — Z1231 Encounter for screening mammogram for malignant neoplasm of breast: Secondary | ICD-10-CM

## 2024-01-22 ENCOUNTER — Other Ambulatory Visit: Payer: Self-pay | Admitting: Internal Medicine

## 2024-01-22 ENCOUNTER — Ambulatory Visit (INDEPENDENT_AMBULATORY_CARE_PROVIDER_SITE_OTHER)

## 2024-01-22 DIAGNOSIS — Z23 Encounter for immunization: Secondary | ICD-10-CM

## 2024-01-22 NOTE — Progress Notes (Addendum)
 Patient received her flu vaccine today there were no questions or concerns.  Medical screening examination/treatment/procedure(s) were performed by non-physician practitioner and as supervising physician I was immediately available for consultation/collaboration.  I agree with above. Karlynn Noel, MD

## 2024-02-09 DIAGNOSIS — H2513 Age-related nuclear cataract, bilateral: Secondary | ICD-10-CM | POA: Diagnosis not present

## 2024-02-09 DIAGNOSIS — H0102B Squamous blepharitis left eye, upper and lower eyelids: Secondary | ICD-10-CM | POA: Diagnosis not present

## 2024-02-09 DIAGNOSIS — H04123 Dry eye syndrome of bilateral lacrimal glands: Secondary | ICD-10-CM | POA: Diagnosis not present

## 2024-02-09 DIAGNOSIS — H0102A Squamous blepharitis right eye, upper and lower eyelids: Secondary | ICD-10-CM | POA: Diagnosis not present

## 2024-02-19 ENCOUNTER — Ambulatory Visit: Payer: Self-pay

## 2024-02-19 ENCOUNTER — Encounter: Payer: Self-pay | Admitting: Emergency Medicine

## 2024-02-19 ENCOUNTER — Ambulatory Visit: Admitting: Emergency Medicine

## 2024-02-19 ENCOUNTER — Ambulatory Visit: Admitting: Podiatry

## 2024-02-19 VITALS — BP 124/76 | HR 47 | Temp 98.3°F | Ht 65.0 in | Wt 193.0 lb

## 2024-02-19 DIAGNOSIS — R590 Localized enlarged lymph nodes: Secondary | ICD-10-CM

## 2024-02-19 MED ORDER — AMOXICILLIN-POT CLAVULANATE 875-125 MG PO TABS: 1.0000 | ORAL_TABLET | Freq: Two times a day (BID) | ORAL | 0 refills | Status: AC

## 2024-02-19 NOTE — Assessment & Plan Note (Signed)
 Acute onset. Tender right submandibular adenopathy No obvious site of infection however recommend to start Augmentin  875 mg twice a day for 7 days and monitor symptoms at home. Advised to rest and stay well-hydrated Advised to contact the office if no better or worse during the next several days.

## 2024-02-19 NOTE — Progress Notes (Signed)
 Heidi Good 63 y.o.   Chief Complaint  Patient presents with   Mass    Patient here with in person interpreter, she is here for lump on the right side of her throat. Patient says its painful and painful to the touch. she just noticed it this morning.    HISTORY OF PRESENT ILLNESS: This is a 63 y.o. female complaining of tender lump just on the right mandible that started this morning. No other associated symptoms No other complaints or medical concerns today.  HPI   Prior to Admission medications   Medication Sig Start Date End Date Taking? Authorizing Provider  albuterol  (VENTOLIN  HFA) 108 (90 Base) MCG/ACT inhaler Inhale 2 puffs into the lungs every 4 (four) hours as needed for wheezing or shortness of breath. 09/18/23  Yes Plotnikov, Aleksei V, MD  AMBULATORY NON FORMULARY MEDICATION Nitroglycerine ointment 0.125 %  Apply a pea sized amount internally four times daily. Dispense 30 GM 1 refill 09/26/21  Yes Danis, Victory LITTIE MOULD, MD  ARIPiprazole  (ABILIFY ) 15 MG tablet Take 1 tablet (15 mg total) by mouth daily. 09/24/23  Yes Cottle, Lorene KANDICE Raddle., MD  clotrimazole -betamethasone  (LOTRISONE ) cream Apply 1 Application topically daily. 11/07/21  Yes Norleen Lynwood ORN, MD  famotidine  (PEPCID ) 40 MG tablet Take 1 tablet (40 mg total) by mouth daily. Patient taking differently: Take 40 mg by mouth as needed for heartburn or indigestion. 02/19/22  Yes Plotnikov, Karlynn GAILS, MD  levothyroxine  (SYNTHROID ) 75 MCG tablet TAKE 1 TABLET(75 MCG) BY MOUTH DAILY 01/22/24  Yes Plotnikov, Aleksei V, MD  metoprolol  succinate (TOPROL  XL) 25 MG 24 hr tablet Take 1 tablet (25 mg total) by mouth at bedtime. 11/11/23  Yes Pietro Redell RAMAN, MD  potassium chloride  (KLOR-CON ) 8 MEQ tablet TAKE 1 TABLET(8 MEQ) BY MOUTH DAILY 01/23/24  Yes Plotnikov, Aleksei V, MD    Allergies  Allergen Reactions   Doxycycline  Nausea And Vomiting   Hydrocodone      Nausea from cough syrup - ?? Very remote   Other     Dog, cat, nuts,  grass, trees    Patient Active Problem List   Diagnosis Date Noted   Hypertension    Rash 11/10/2021   Finger pain, right 08/20/2021   Asthmatic bronchitis with exacerbation 03/06/2021   Elevated BP without diagnosis of hypertension 03/06/2021   Trochanteric bursitis of left hip 12/18/2020   Grief 09/05/2020   Hot flashes 09/05/2020   At risk for obstructive sleep apnea 03/27/2020   Healthcare maintenance 03/27/2020   Poor sleep hygiene 03/27/2020   Edema 06/07/2019   Right Achilles tendinitis 08/11/2018   Hyperglycemia 07/29/2018   Blepharitis of eyelid of right eye 06/16/2018   Palpitations 04/30/2018   Stye external 11/21/2017   Allergic rhinitis 11/05/2017   Restless legs syndrome (RLS) 08/05/2017   Insomnia 08/05/2017   Paresthesia 04/07/2017   Foot pain, left 04/07/2017   Plantar fasciitis 02/26/2017   Chest pain, atypical 12/04/2016   Snoring 09/25/2016   Arthralgia 09/04/2016   GERD (gastroesophageal reflux disease) 04/03/2016   Mass of face 02/06/2016   Fatigue 08/02/2015   Acute sinusitis 08/02/2015   Hip pain, bilateral 06/01/2015   Prediabetes 03/08/2015   Hyperlipidemia 03/08/2015   Anal fissure 07/05/2014   Chronic constipation 07/17/2013   Seasonal and perennial allergic rhinitis 08/28/2011   Asthma, mild intermittent 08/28/2011   Obstructive sleep apnea 08/28/2011   Gastroparesis 04/25/2010   IBS 08/16/2008   Hypothyroidism 07/06/2008   Paranoid schizophrenia, chronic condition (HCC) 07/06/2008  Major depressive disorder, single episode, moderate (HCC) 07/06/2008   Hearing loss 07/06/2008    Past Medical History:  Diagnosis Date   Anxiety    Constipation    Deafness    Depression    Diabetes mellitus (HCC)    Gastroparesis    Hyperlipemia    Hypertension    Hypothyroidism     Past Surgical History:  Procedure Laterality Date   ABDOMINAL HYSTERECTOMY     ANAL RECTAL MANOMETRY N/A 03/07/2017   Procedure: ANO RECTAL MANOMETRY;   Surgeon: Nandigam, Kavitha V, MD;  Location: [facility];  Service: Endoscopy;  Laterality: N/A;   BUNIONECTOMY Left     Social History   Socioeconomic History   Marital status: Divorced    Spouse name: Not on file   Number of children: 0   Years of education: Not on file   Highest education level: Not on file  Occupational History   Occupation: disablied    Employer: RETIRED  Tobacco Use   Smoking status: Never   Smokeless tobacco: Never  Vaping Use   Vaping status: Never Used  Substance and Sexual Activity   Alcohol use: Never   Drug use: Never   Sexual activity: Not on file  Other Topics Concern   Not on file  Social History Narrative   Not on file   Social Drivers of Health   Financial Resource Strain: Low Risk  (02/19/2022)   Overall Financial Resource Strain (CARDIA)    Difficulty of Paying Living Expenses: Not hard at all  Food Insecurity: No Food Insecurity (02/19/2022)   Hunger Vital Sign    Worried About Running Out of Food in the Last Year: Never true    Ran Out of Food in the Last Year: Never true  Transportation Needs: No Transportation Needs (02/19/2022)   PRAPARE - Administrator, Civil Service (Medical): No    Lack of Transportation (Non-Medical): No  Physical Activity: Sufficiently Active (02/19/2022)   Exercise Vital Sign    Days of Exercise per Week: 5 days    Minutes of Exercise per Session: 30 min  Stress: No Stress Concern Present (02/19/2022)   [employer] of Occupational Health - Occupational Stress Questionnaire    Feeling of Stress : Not at all  Social Connections: Moderately Integrated (02/19/2022)   Social Connection and Isolation Panel    Frequency of Communication with Friends and Family: More than three times a week    Frequency of Social Gatherings with Friends and Family: More than three times a week    Attends Religious Services: More than 4 times per year    Active Member of [employer] or Organizations: Yes     Attends Engineer, structural: More than 4 times per year    Marital Status: Divorced  Catering manager Violence: Not At Risk (02/19/2022)   Humiliation, Afraid, Rape, and Kick questionnaire    Fear of Current or Ex-Partner: No    Emotionally Abused: No    Physically Abused: No    Sexually Abused: No    Family History  Problem Relation Age of Onset   Diabetes Mother    Colon polyps Mother        over 150 polyps detected   Heart disease Father    Multiple myeloma Father    Emphysema Father    Heart attack Father    Liver cancer Maternal Grandmother        not primary source, spread to her liver   Heart  attack Maternal Grandfather    Emphysema Paternal Grandfather    Colon cancer Neg Hx    Esophageal cancer Neg Hx    Rectal cancer Neg Hx    Stomach cancer Neg Hx    BRCA 1/2 Neg Hx    Breast cancer Neg Hx      Review of Systems  Constitutional: Negative.  Negative for chills and fever.  HENT:  Negative for congestion and sore throat.   Respiratory: Negative.  Negative for cough and shortness of breath.   Cardiovascular: Negative.  Negative for chest pain and palpitations.  Gastrointestinal:  Negative for abdominal pain, diarrhea, nausea and vomiting.  Genitourinary: Negative.  Negative for dysuria and hematuria.  Skin: Negative.  Negative for rash.  Neurological: Negative.  Negative for dizziness and headaches.  All other systems reviewed and are negative.   Vitals:   02/19/24 1425  BP: 124/76  Pulse: (!) 47  Temp: 98.3 F (36.8 C)  SpO2: 96%    Physical Exam Vitals reviewed.  Constitutional:      Appearance: Normal appearance.  HENT:     Head: Normocephalic.     Mouth/Throat:     Mouth: Mucous membranes are moist.     Pharynx: Oropharynx is clear. No oropharyngeal exudate or posterior oropharyngeal erythema.  Eyes:     Extraocular Movements: Extraocular movements intact.     Pupils: Pupils are equal, round, and reactive to light.  Cardiovascular:      Rate and Rhythm: Normal rate and regular rhythm.     Pulses: Normal pulses.     Heart sounds: Normal heart sounds.  Pulmonary:     Effort: Pulmonary effort is normal.     Breath sounds: Normal breath sounds.  Musculoskeletal:     Cervical back: No tenderness.  Lymphadenopathy:     Head:     Right side of head: Submandibular adenopathy present.     Cervical: No cervical adenopathy.  Skin:    General: Skin is warm and dry.  Neurological:     Mental Status: She is alert and oriented to person, place, and time.  Psychiatric:        Mood and Affect: Mood normal.        Behavior: Behavior normal.      ASSESSMENT & PLAN: I personally spent a total of 30 minutes minutes in the care of the patient today including preparing to see the patient, getting/reviewing separately obtained history, performing a medically appropriate exam/evaluation, counseling and educating, placing orders, documenting clinical information in the EHR, coordinating care, and prognosis, need for antibiotics, and need for follow-up if no better or worse during the next several days..  Problem List Items Addressed This Visit       Immune and Lymphatic   Submandibular lymphadenopathy - Primary   Acute onset. Tender right submandibular adenopathy No obvious site of infection however recommend to start Augmentin  875 mg twice a day for 7 days and monitor symptoms at home. Advised to rest and stay well-hydrated Advised to contact the office if no better or worse during the next several days.      Relevant Medications   amoxicillin -clavulanate (AUGMENTIN ) 875-125 MG tablet   Patient Instructions  Lymphadenopathy  Lymphadenopathy is when your lymph glands are swollen or larger than normal.  Lymph glands, also called lymph nodes, are clumps of tissue. They filter germs and waste from tissues in your body to your bloodstream. They're part of your body's defense system, or immune system. Lymphadenopathy has  different causes, like infection, autoimmune disease, and cancer. Lymphadenopathy can happen wherever you have lymph nodes. The type you have depends on which nodes it's in, such as: Cervical lymphadenopathy. This is in the neck. Mediastinal lymphadenopathy. This is in the chest. Hilar lymphadenopathy. This is in the lungs. Axillary lymphadenopathy. This is in the armpits. Inguinal lymphadenopathy. This is in the groin. Sometimes, fluid and cells that fight infection build up in your lymph nodes. This happens when your immune system reacts to germs or other substances that get into your body. This makes lymph nodes swell and get bigger. Treatment is based on what's thought to be the cause. Sometimes, lymph nodes don't go back to normal size after treatment. If yours don't, your health care provider may order tests to help learn why your glands are still swollen and big. Follow these instructions at home:  Take over-the-counter and prescription medicines only as told by your provider. If you were prescribed antibiotics, do not stop using them, even if you start to feel better. If told, apply heat to swollen lymph nodes as told by your provider. Use the heat source that your provider recommends, such as a moist heat pack or a heating pad. Place a towel between your skin and the heat source. Leave the heat on only for the time told by your provider to avoid injury. If your skin turns bright red, remove the heat right away to prevent burns. The risk of burns is higher if you cannot feel pain, heat, or cold. Check your swollen lymph nodes every day for changes. Check other places where you have lymph nodes as told. Check for changes such as: More swelling. Sudden growth in size. Redness or pain. Hardness. Contact a health care provider if: You have lymph nodes that: Are still swollen after 2 weeks. Have gotten bigger all of a sudden or the swelling spreads. Are red, painful, or hard. Fluid leaks  from the skin near a swollen lymph node. You get a fever, chills, or night sweats. You feel tired. You have a sore throat. Your abdomen hurts. You lose weight without trying. This information is not intended to replace advice given to you by your health care provider. Make sure you discuss any questions you have with your health care provider. Document Revised: 07/24/2022 Document Reviewed: 07/24/2022 Elsevier Patient Education  2024 Elsevier Inc.    Emil Schaumann, MD [facility]

## 2024-02-19 NOTE — Patient Instructions (Signed)
 Lymphadenopathy  Lymphadenopathy is when your lymph glands are swollen or larger than normal.  Lymph glands, also called lymph nodes, are clumps of tissue. They filter germs and waste from tissues in your body to your bloodstream. They're part of your body's defense system, or immune system. Lymphadenopathy has different causes, like infection, autoimmune disease, and cancer. Lymphadenopathy can happen wherever you have lymph nodes. The type you have depends on which nodes it's in, such as: Cervical lymphadenopathy. This is in the neck. Mediastinal lymphadenopathy. This is in the chest. Hilar lymphadenopathy. This is in the lungs. Axillary lymphadenopathy. This is in the armpits. Inguinal lymphadenopathy. This is in the groin. Sometimes, fluid and cells that fight infection build up in your lymph nodes. This happens when your immune system reacts to germs or other substances that get into your body. This makes lymph nodes swell and get bigger. Treatment is based on what's thought to be the cause. Sometimes, lymph nodes don't go back to normal size after treatment. If yours don't, your health care provider may order tests to help learn why your glands are still swollen and big. Follow these instructions at home:  Take over-the-counter and prescription medicines only as told by your provider. If you were prescribed antibiotics, do not stop using them, even if you start to feel better. If told, apply heat to swollen lymph nodes as told by your provider. Use the heat source that your provider recommends, such as a moist heat pack or a heating pad. Place a towel between your skin and the heat source. Leave the heat on only for the time told by your provider to avoid injury. If your skin turns bright red, remove the heat right away to prevent burns. The risk of burns is higher if you cannot feel pain, heat, or cold. Check your swollen lymph nodes every day for changes. Check other places where you have  lymph nodes as told. Check for changes such as: More swelling. Sudden growth in size. Redness or pain. Hardness. Contact a health care provider if: You have lymph nodes that: Are still swollen after 2 weeks. Have gotten bigger all of a sudden or the swelling spreads. Are red, painful, or hard. Fluid leaks from the skin near a swollen lymph node. You get a fever, chills, or night sweats. You feel tired. You have a sore throat. Your abdomen hurts. You lose weight without trying. This information is not intended to replace advice given to you by your health care provider. Make sure you discuss any questions you have with your health care provider. Document Revised: 07/24/2022 Document Reviewed: 07/24/2022 Elsevier Patient Education  2024 [employer].

## 2024-02-19 NOTE — Telephone Encounter (Signed)
  FYI Only or Action Required?: FYI only for provider.  Patient was last seen in primary care on 09/18/2023 by Plotnikov, Karlynn GAILS, MD.  Called Nurse Triage reporting Oral Swelling.  Symptoms began today.  Interventions attempted: Nothing.  Symptoms are: stable.  Triage Disposition: See Physician Within 24 Hours  Patient/caregiver understands and will follow disposition?: Yes Reason for Disposition  Nursing judgment or information in reference  Answer Assessment - Initial Assessment Questions Interpreter [phone] on the line.   1. REASON FOR CALL: What is your main concern right now?     Noticed a lump on right side of throat this morning  2. ONSET: When did the swelling start?     This morning  3. SEVERITY: How bad is the swelling?     Smooth on the left side, on the right side can feel something the size of of a grape, tender to touch, uncomfortable to move head from side to side. Feels soft, like fluid maybe  4. FEVER: Do you have a fever?     Denies  5. OTHER SYMPTOMS: Do you have any other new symptoms?     Denies  Protocols used: No Guideline Available-[facility]  Copied from CRM #[id number]. Topic: Clinical - Red Word Triage >> Feb 19, 2024  1:14 PM Alfonso ORN wrote: Red Word that prompted transfer to Nurse Triage: thyroid /throat swollen on right side near chin

## 2024-02-20 NOTE — Telephone Encounter (Signed)
 Noted

## 2024-02-23 NOTE — Telephone Encounter (Signed)
 Noted.  Please schedule office visit.  Thank you

## 2024-03-22 ENCOUNTER — Ambulatory Visit

## 2024-03-22 ENCOUNTER — Encounter: Payer: Self-pay | Admitting: Internal Medicine

## 2024-03-22 ENCOUNTER — Ambulatory Visit: Admitting: Internal Medicine

## 2024-03-22 VITALS — BP 128/72 | HR 70 | Ht 65.0 in | Wt 192.4 lb

## 2024-03-22 DIAGNOSIS — I10 Essential (primary) hypertension: Secondary | ICD-10-CM

## 2024-03-22 DIAGNOSIS — R739 Hyperglycemia, unspecified: Secondary | ICD-10-CM

## 2024-03-22 DIAGNOSIS — F321 Major depressive disorder, single episode, moderate: Secondary | ICD-10-CM

## 2024-03-22 DIAGNOSIS — E034 Atrophy of thyroid (acquired): Secondary | ICD-10-CM | POA: Diagnosis not present

## 2024-03-22 DIAGNOSIS — Z114 Encounter for screening for human immunodeficiency virus [HIV]: Secondary | ICD-10-CM

## 2024-03-22 DIAGNOSIS — E785 Hyperlipidemia, unspecified: Secondary | ICD-10-CM

## 2024-03-22 DIAGNOSIS — Z1159 Encounter for screening for other viral diseases: Secondary | ICD-10-CM

## 2024-03-22 DIAGNOSIS — R002 Palpitations: Secondary | ICD-10-CM

## 2024-03-22 DIAGNOSIS — Z0000 Encounter for general adult medical examination without abnormal findings: Secondary | ICD-10-CM | POA: Diagnosis not present

## 2024-03-22 DIAGNOSIS — G4733 Obstructive sleep apnea (adult) (pediatric): Secondary | ICD-10-CM | POA: Diagnosis not present

## 2024-03-22 LAB — LIPID PANEL
Cholesterol: 212 mg/dL — ABNORMAL HIGH (ref 0–200)
HDL: 46.8 mg/dL (ref >39.00)
LDL Cholesterol: 129 mg/dL — ABNORMAL HIGH (ref 0–99)
NonHDL: 165.58
Total CHOL/HDL Ratio: 5
Triglycerides: 185 mg/dL — ABNORMAL HIGH (ref 0.0–149.0)
VLDL: 37 mg/dL (ref 0.0–40.0)

## 2024-03-22 LAB — COMPREHENSIVE METABOLIC PANEL WITH GFR
ALT: 24 U/L (ref 0–35)
AST: 31 U/L (ref 0–37)
Albumin: 4.1 g/dL (ref 3.5–5.2)
Alkaline Phosphatase: 110 U/L (ref 39–117)
BUN: 9 mg/dL (ref 6–23)
CO2: 27 meq/L (ref 19–32)
Calcium: 9.4 mg/dL (ref 8.4–10.5)
Chloride: 99 meq/L (ref 96–112)
Creatinine, Ser: 0.67 mg/dL (ref 0.40–1.20)
GFR: 93.46 mL/min (ref >60.00)
Glucose, Bld: 89 mg/dL (ref 70–99)
Potassium: 3.6 meq/L (ref 3.5–5.1)
Sodium: 137 meq/L (ref 135–145)
Total Bilirubin: 1.1 mg/dL (ref 0.2–1.2)
Total Protein: 7.7 g/dL (ref 6.0–8.3)

## 2024-03-22 LAB — CBC WITH DIFFERENTIAL/PLATELET
Basophils Absolute: 0.1 K/uL (ref 0.0–0.1)
Basophils Relative: 1.3 % (ref 0.0–3.0)
Eosinophils Absolute: 0.2 K/uL (ref 0.0–0.7)
Eosinophils Relative: 2 % (ref 0.0–5.0)
HCT: 43.3 % (ref 36.0–46.0)
Hemoglobin: 14.8 g/dL (ref 12.0–15.0)
Lymphocytes Relative: 30.2 % (ref 12.0–46.0)
Lymphs Abs: 3.4 K/uL (ref 0.7–4.0)
MCHC: 34.1 g/dL (ref 30.0–36.0)
MCV: 87.9 fl (ref 78.0–100.0)
Monocytes Absolute: 0.7 K/uL (ref 0.1–1.0)
Monocytes Relative: 6.6 % (ref 3.0–12.0)
Neutro Abs: 6.7 K/uL (ref 1.4–7.7)
Neutrophils Relative %: 59.9 % (ref 43.0–77.0)
Platelets: 139 K/uL — ABNORMAL LOW (ref 150.0–400.0)
RBC: 4.93 Mil/uL (ref 3.87–5.11)
RDW: 13 % (ref 11.5–15.5)
WBC: 11.1 K/uL — ABNORMAL HIGH (ref 4.0–10.5)

## 2024-03-22 LAB — TSH: TSH: 0.49 u[IU]/mL (ref 0.35–5.50)

## 2024-03-22 LAB — HEMOGLOBIN A1C: Hgb A1c MFr Bld: 6.2 % (ref 4.6–6.5)

## 2024-03-22 NOTE — Progress Notes (Cosign Needed Addendum)
 Subjective:   Heidi Good is a 63 y.o. female who presents for a Medicare Annual Wellness Visit.  Interpreter Use: Sign-Language Bobbetta Gallus w/[place])  Allergies (verified) Doxycycline , Hydrocodone , and Other   History: Past Medical History:  Diagnosis Date   Anxiety    Constipation    Deafness    Depression    Diabetes mellitus (HCC)    Gastroparesis    Hyperlipemia    Hypertension    Hypothyroidism    Past Surgical History:  Procedure Laterality Date   ABDOMINAL HYSTERECTOMY     ANAL RECTAL MANOMETRY N/A 03/07/2017   Procedure: ANO RECTAL MANOMETRY;  Surgeon: Nandigam, Kavitha V, MD;  Location: [facility];  Service: Endoscopy;  Laterality: N/A;   BUNIONECTOMY Left    Family History  Problem Relation Age of Onset   Diabetes Mother    Colon polyps Mother        over 150 polyps detected   Heart disease Father    Multiple myeloma Father    Emphysema Father    Heart attack Father    Liver cancer Maternal Grandmother        not primary source, spread to her liver   Heart attack Maternal Grandfather    Emphysema Paternal Grandfather    Colon cancer Neg Hx    Esophageal cancer Neg Hx    Rectal cancer Neg Hx    Stomach cancer Neg Hx    BRCA 1/2 Neg Hx    Breast cancer Neg Hx    Social History   Occupational History   Occupation: disablied    Employer: RETIRED  Tobacco Use   Smoking status: Never   Smokeless tobacco: Never  Vaping Use   Vaping status: Never Used  Substance and Sexual Activity   Alcohol use: Never   Drug use: Never   Sexual activity: Not Currently   Tobacco Counseling Counseling given: Not Answered  SDOH Screenings   Food Insecurity: No Food Insecurity (03/22/2024)  Housing: Unknown (03/22/2024)  Transportation Needs: No Transportation Needs (03/22/2024)  Utilities: Not At Risk (03/22/2024)  Alcohol Screen: Low Risk  (02/19/2022)  Depression (PHQ2-9): Low Risk  (03/22/2024)  Financial Resource Strain: Low Risk   (02/19/2022)  Physical Activity: Insufficiently Active (03/22/2024)  Social Connections: Moderately Integrated (03/22/2024)  Stress: No Stress Concern Present (03/22/2024)  Tobacco Use: Low Risk  (03/22/2024)  Health Literacy: Adequate Health Literacy (03/22/2024)   Depression Screen    03/22/2024    1:26 PM 02/19/2024    2:32 PM 07/14/2023    2:20 PM 02/20/2023    1:24 PM 02/20/2023    1:21 PM 08/21/2022    1:22 PM 02/19/2022    1:32 PM  PHQ 2/9 Scores  PHQ - 2 Score 0 0 0 0 0 0 0  PHQ- 9 Score 0   0   0  0      Data saved with a previous flowsheet row definition     Goals Addressed               This Visit's Progress     Patient Stated (pt-stated)        Patient stated she plans to continue walking TIW       Visit info / Clinical Intake: Medicare Wellness Visit Type:: Subsequent Annual Wellness Visit Persons participating in visit:: patient Medicare Wellness Visit Mode:: In-person (required for WTM) Information given by:: patient Interpreter Needed?: Yes Interpreter Agency: Engineer, Maintenance (it) Interpreter Name: Heidi Good Patient Declined Interpreter : Yes  Interpretation services provided by: [facility] Interpreter Patient signed  waiver: Yes Pre-visit prep was completed: yes AWV questionnaire completed by patient prior to visit?: no Living arrangements:: (!) lives alone Patient's Overall Health Status Rating: good Typical amount of pain: none Does pain affect daily life?: no Are you currently prescribed opioids?: no  Dietary Habits and Nutritional Risks How many meals a day?: 3 Eats fruit and vegetables daily?: yes Most meals are obtained by: preparing own meals In the last 2 weeks, have you had any of the following?: none Diabetic:: no  Functional Status Activities of Daily Living (to include ambulation/medication): Independent Ambulation: Independent with device- listed below Home Assistive Devices/Equipment: Eyeglasses; Other  (Comment); Shower/tub chair (DEAF - uses an Equities Trader) Medication Administration: Independent Home Management: Independent Manage your own finances?: yes Primary transportation is: driving Concerns about vision?: no *vision screening is required for WTM* Concerns about hearing?: (!) yes Uses hearing aids?: (!) yes (uses sign language) Hear whispered voice?: (!) no *in-person visit only* (Uses a Engineer, Technical Sales)  Fall Screening Falls in the past year?: 0 Number of falls in past year: 1 Was there an injury with Fall?: 0 Fall Risk Category Calculator: 1 Patient Fall Risk Level: Low Fall Risk  Fall Risk Patient at Risk for Falls Due to: No Fall Risks Fall risk Follow up: Falls evaluation completed; Falls prevention discussed  Home and Transportation Safety: All rugs have non-skid backing?: yes All stairs or steps have railings?: yes Grab bars in the bathtub or shower?: (!) no Have non-skid surface in bathtub or shower?: yes (has a shower chair) Good home lighting?: yes Regular seat belt use?: yes Hospital stays in the last year:: no  Cognitive Assessment Difficulty concentrating, remembering, or making decisions? : no Will 6CIT or Mini Cog be Completed: yes What year is it?: 0 points What month is it?: 0 points Give patient an address phrase to remember (5 components): [address] [place], [place] About what time is it?: 0 points Count backwards from 20 to 1: 0 points Say the months of the year in reverse: 0 points Repeat the address phrase from earlier: 0 points 6 CIT Score: 0 points  Advance Directives (For Healthcare) Does Patient Have a Medical Advance Directive?: No Would patient like information on creating a medical advance directive?: No - Patient declined  Reviewed/Updated  Reviewed/Updated: Reviewed All (Medical, Surgical, Family, Medications, Allergies, Care Teams, Patient Goals)        Objective:    Today's Vitals   03/22/24 1310  BP: 128/72   Pulse: 70  SpO2: 97%  Weight: 192 lb 6.4 oz (87.3 kg)  Height: 5' 5 (1.651 m)   Body mass index is 32.02 kg/m.  Current Medications (verified) Outpatient Encounter Medications as of 03/22/2024  Medication Sig   albuterol  (VENTOLIN  HFA) 108 (90 Base) MCG/ACT inhaler Inhale 2 puffs into the lungs every 4 (four) hours as needed for wheezing or shortness of breath.   AMBULATORY NON FORMULARY MEDICATION Nitroglycerine ointment 0.125 %  Apply a pea sized amount internally four times daily. Dispense 30 GM 1 refill   ARIPiprazole  (ABILIFY ) 15 MG tablet Take 1 tablet (15 mg total) by mouth daily.   clotrimazole -betamethasone  (LOTRISONE ) cream Apply 1 Application topically daily.   famotidine  (PEPCID ) 40 MG tablet Take 1 tablet (40 mg total) by mouth daily. (Patient taking differently: Take 40 mg by mouth as needed for heartburn or indigestion.)   levothyroxine  (SYNTHROID ) 75 MCG tablet TAKE 1 TABLET(75 MCG) BY MOUTH DAILY  metoprolol  succinate (TOPROL  XL) 25 MG 24 hr tablet Take 1 tablet (25 mg total) by mouth at bedtime.   potassium chloride  (KLOR-CON ) 8 MEQ tablet TAKE 1 TABLET(8 MEQ) BY MOUTH DAILY   No facility-administered encounter medications on file as of 03/22/2024.   Hearing/Vision screen Hearing Screening - Comments:: DEAF - uses Sign Language Vision Screening - Comments:: Wears rx glasses - up to date with routine eye exams with [facility] Immunizations and Health Maintenance Health Maintenance  Topic Date Due   FOOT EXAM  Never done   HIV Screening  Never done   Diabetic kidney evaluation - Urine ACR  Never done   Hepatitis C Screening  Never done   Zoster Vaccines- Shingrix (1 of 2) Never done   COVID-19 Vaccine (3 - 2025-26 season) 01/12/2024   HEMOGLOBIN A1C  03/20/2024   Diabetic kidney evaluation - eGFR measurement  09/17/2024   OPHTHALMOLOGY EXAM  02/08/2025   DTaP/Tdap/Td (2 - Td or Tdap) 03/07/2025   Medicare Annual Wellness (AWV)  03/22/2025   Mammogram   01/20/2026   Colonoscopy  06/18/2026   Pneumococcal Vaccine: 50+ Years  Completed   Influenza Vaccine  Completed   Hepatitis B Vaccines 19-59 Average Risk  Aged Out   HPV VACCINES  Aged Out   Meningococcal B Vaccine  Aged Out        Assessment/Plan:  This is a routine wellness examination for Devetta.  Patient Care Team: Plotnikov, Karlynn GAILS, MD as PCP - General (Internal Medicine) Pietro Redell RAMAN, MD as PCP - Cardiology (Cardiology) Shellia Oh, MD as Consulting Physician (Pulmonary Disease) Cottle, Lorene KANDICE Raddle., MD as Attending Physician (Psychiatry) [facility] Octavia Bruckner, MD as Consulting Physician (Ophthalmology)  I have personally reviewed and noted the following in the patient's chart:   Medical and social history Use of alcohol, tobacco or illicit drugs  Current medications and supplements including opioid prescriptions. Functional ability and status Nutritional status Physical activity Advanced directives List of other physicians Hospitalizations, surgeries, and ER visits in previous 12 months Vitals Screenings to include cognitive, depression, and falls Referrals and appointments  Orders Placed This Encounter  Procedures   HIV Antibody (routine testing w rflx)    Standing Status:   Future    Expiration Date:   03/22/2025   Hepatitis C antibody    Standing Status:   Future    Expiration Date:   03/22/2025   In addition, I have reviewed and discussed with patient certain preventive protocols, quality metrics, and best practice recommendations. A written personalized care plan for preventive services as well as general preventive health recommendations were provided to patient.   Verdie CHRISTELLA Saba, CMA   03/22/2024   Return in 1 year (on 03/22/2025).  After Visit Summary: (In Person-Declined) Patient declined AVS at this time.  Nurse Notes: Scheduled 2026 AWV/CPE appts.  Medical screening examination/treatment/procedure(s) were  performed by non-physician practitioner and as supervising physician I was immediately available for consultation/collaboration.  I agree with above. Karlynn Noel, MD

## 2024-03-22 NOTE — Assessment & Plan Note (Addendum)
 Frequent PVCs - no sx's On Toprol  - better F/u w/Cardiology

## 2024-03-22 NOTE — Progress Notes (Signed)
 Subjective:  Patient ID: Heidi Good, female    DOB: 10-31-1960  Age: 63 y.o. MRN: [id number]  CC: Medical Management of Chronic Issues (6 Month follow up)   HPI Heidi Good presents for hypothyroidism, depression, HTN, PVCs New dog 1 yo lab mix Dolly  Outpatient Medications Prior to Visit  Medication Sig Dispense Refill   albuterol  (VENTOLIN  HFA) 108 (90 Base) MCG/ACT inhaler Inhale 2 puffs into the lungs every 4 (four) hours as needed for wheezing or shortness of breath. 8 g 5   AMBULATORY NON FORMULARY MEDICATION Nitroglycerine ointment 0.125 %  Apply a pea sized amount internally four times daily. Dispense 30 GM 1 refill 30 g 1   ARIPiprazole  (ABILIFY ) 15 MG tablet Take 1 tablet (15 mg total) by mouth daily. 90 tablet 2   clotrimazole -betamethasone  (LOTRISONE ) cream Apply 1 Application topically daily. 30 g 0   famotidine  (PEPCID ) 40 MG tablet Take 1 tablet (40 mg total) by mouth daily. (Patient taking differently: Take 40 mg by mouth as needed for heartburn or indigestion.) 90 tablet 3   levothyroxine  (SYNTHROID ) 75 MCG tablet TAKE 1 TABLET(75 MCG) BY MOUTH DAILY 90 tablet 3   metoprolol  succinate (TOPROL  XL) 25 MG 24 hr tablet Take 1 tablet (25 mg total) by mouth at bedtime. 90 tablet 3   potassium chloride  (KLOR-CON ) 8 MEQ tablet TAKE 1 TABLET(8 MEQ) BY MOUTH DAILY 90 tablet 3   No facility-administered medications prior to visit.    ROS: Review of Systems  Constitutional:  Negative for activity change, appetite change, chills, fatigue and unexpected weight change.  HENT:  Negative for congestion, mouth sores and sinus pressure.   Eyes:  Negative for visual disturbance.  Respiratory:  Negative for cough and chest tightness.   Cardiovascular:  Negative for leg swelling.  Gastrointestinal:  Negative for abdominal pain and nausea.  Genitourinary:  Negative for difficulty urinating, frequency and vaginal pain.  Musculoskeletal:  Negative for back pain and gait problem.   Skin:  Negative for pallor and rash.  Neurological:  Negative for dizziness, tremors, weakness, numbness and headaches.  Hematological:  Does not bruise/bleed easily.  Psychiatric/Behavioral:  Negative for confusion and sleep disturbance.     Objective:  There were no vitals taken for this visit.  BP Readings from Last 3 Encounters:  03/22/24 128/72  02/19/24 124/76  11/11/23 (!) 160/80    Wt Readings from Last 3 Encounters:  03/22/24 192 lb 6.4 oz (87.3 kg)  02/19/24 193 lb (87.5 kg)  01/06/24 192 lb (87.1 kg)    Physical Exam Constitutional:      General: She is not in acute distress.    Appearance: She is well-developed. She is obese.  HENT:     Head: Normocephalic.     Right Ear: External ear normal.     Left Ear: External ear normal.     Nose: Nose normal.  Eyes:     General:        Right eye: No discharge.        Left eye: No discharge.     Conjunctiva/sclera: Conjunctivae normal.     Pupils: Pupils are equal, round, and reactive to light.  Neck:     Thyroid : No thyromegaly.     Vascular: No JVD.     Trachea: No tracheal deviation.  Cardiovascular:     Rate and Rhythm: Normal rate and regular rhythm.     Heart sounds: Normal heart sounds.  Pulmonary:     Effort:  No respiratory distress.     Breath sounds: No stridor. No wheezing.  Abdominal:     General: Bowel sounds are normal. There is no distension.     Palpations: Abdomen is soft. There is no mass.     Tenderness: There is no abdominal tenderness. There is no guarding or rebound.  Musculoskeletal:        General: Tenderness present.     Cervical back: Normal range of motion and neck supple. No rigidity.  Lymphadenopathy:     Cervical: No cervical adenopathy.  Skin:    Findings: No erythema or rash.  Neurological:     Cranial Nerves: No cranial nerve deficit.     Motor: No abnormal muscle tone.     Coordination: Coordination normal.     Deep Tendon Reflexes: Reflexes normal.  Psychiatric:         Behavior: Behavior normal.        Thought Content: Thought content normal.        Judgment: Judgment normal.   PVCs Obese  Lab Results  Component Value Date   WBC 11.2 (H) 09/18/2023   HGB 15.2 (H) 09/18/2023   HCT 45.2 09/18/2023   PLT 157.0 09/18/2023   GLUCOSE 93 09/18/2023   CHOL 216 (H) 08/21/2022   TRIG 171.0 (H) 08/21/2022   HDL 52.80 08/21/2022   LDLDIRECT 52.0 03/07/2020   LDLCALC 129 (H) 08/21/2022   ALT 26 09/18/2023   AST 38 (H) 09/18/2023   NA 134 (L) 09/18/2023   K 4.1 09/18/2023   CL 96 09/18/2023   CREATININE 0.66 09/18/2023   BUN 8 09/18/2023   CO2 29 09/18/2023   TSH 0.68 09/18/2023   HGBA1C 6.6 (H) 09/18/2023    MM 3D SCREENING MAMMOGRAM BILATERAL BREAST Result Date: 01/26/2024 CLINICAL DATA:  Screening. EXAM: DIGITAL SCREENING BILATERAL MAMMOGRAM WITH TOMOSYNTHESIS AND CAD TECHNIQUE: Bilateral screening digital craniocaudal and mediolateral oblique mammograms were obtained. Bilateral screening digital breast tomosynthesis was performed. The images were evaluated with computer-aided detection. COMPARISON:  Previous exam(s). ACR Breast Density Category c: The breasts are heterogeneously dense, which may obscure small masses. FINDINGS: There are no findings suspicious for malignancy. IMPRESSION: No mammographic evidence of malignancy. A result letter of this screening mammogram will be mailed directly to the patient. RECOMMENDATION: Screening mammogram in one year. (Code:SM-B-01Y) BI-RADS CATEGORY  1: Negative. Electronically Signed   By: Norleen Croak M.D.   On: 01/26/2024 10:18    Assessment & Plan:   Problem List Items Addressed This Visit     Hyperglycemia   Check A1c      Relevant Orders   Hemoglobin A1c   Microalbumin / creatinine urine ratio   Hypertension   On Toprol       Relevant Orders   Comprehensive metabolic panel with GFR   CBC with Differential/Platelet   TSH   Hemoglobin A1c   Microalbumin / creatinine urine ratio    Hypothyroidism   Cont w/Levothroid Monitor TSH, FT4      Major depressive disorder, single episode, moderate (HCC)   Obstructive sleep apnea   Not using CPAP      Palpitations - Primary   Frequent PVCs - no sx's On Toprol  - better F/u w/Cardiology      Other Visit Diagnoses       Dyslipidemia       Relevant Orders   Comprehensive metabolic panel with GFR   TSH   Lipid panel         No orders of  the defined types were placed in this encounter.     Follow-up: Return in about 6 months (around 09/19/2024) for a follow-up visit.  Marolyn Noel, MD

## 2024-03-22 NOTE — Assessment & Plan Note (Signed)
 Not using CPAP.

## 2024-03-22 NOTE — Assessment & Plan Note (Signed)
 Check A1c.

## 2024-03-22 NOTE — Patient Instructions (Addendum)
 Ms. Cygan,  Thank you for taking the time for your Medicare Wellness Visit. I appreciate your continued commitment to your health goals. Please review the care plan we discussed, and feel free to reach out if I can assist you further.  Please note that Annual Wellness Visits do not include a physical exam. Some assessments may be limited, especially if the visit was conducted virtually. If needed, we may recommend an in-person follow-up with your provider.  Ongoing Care Seeing your primary care provider every 3 to 6 months helps us  monitor your health and provide consistent, personalized care.   Referrals If a referral was made during today's visit and you haven't received any updates within two weeks, please contact the referred provider directly to check on the status.  Recommended Screenings:  Health Maintenance  Topic Date Due   Complete foot exam   Never done   HIV Screening  Never done   Yearly kidney health urinalysis for diabetes  Never done   Hepatitis C Screening  Never done   Zoster (Shingles) Vaccine (1 of 2) Never done   COVID-19 Vaccine (3 - 2025-26 season) 01/12/2024   Hemoglobin A1C  03/20/2024   Yearly kidney function blood test for diabetes  09/17/2024   Eye exam for diabetics  02/08/2025   DTaP/Tdap/Td vaccine (2 - Td or Tdap) 03/07/2025   Medicare Annual Wellness Visit  03/22/2025   Breast Cancer Screening  01/20/2026   Colon Cancer Screening  06/18/2026   Pneumococcal Vaccine for age over 47  Completed   Flu Shot  Completed   Hepatitis B Vaccine  Aged Out   HPV Vaccine  Aged Out   Meningitis B Vaccine  Aged Out       03/22/2024    1:12 PM  Advanced Directives  Does Patient Have a Medical Advance Directive? No  Would patient like information on creating a medical advance directive? No - Patient declined    Vision: Annual vision screenings are recommended for early detection of glaucoma, cataracts, and diabetic retinopathy. These exams can also reveal  signs of chronic conditions such as diabetes and high blood pressure.  Dental: Annual dental screenings help detect early signs of oral cancer, gum disease, and other conditions linked to overall health, including heart disease and diabetes.

## 2024-03-22 NOTE — Assessment & Plan Note (Signed)
Cont w/Levothroid Monitor TSH, FT4

## 2024-03-22 NOTE — Assessment & Plan Note (Signed)
 On Toprol

## 2024-03-23 ENCOUNTER — Telehealth: Payer: Self-pay

## 2024-03-23 LAB — HIV ANTIBODY (ROUTINE TESTING W REFLEX)
HIV 1&2 Ab, 4th Generation: NONREACTIVE
HIV FINAL INTERPRETATION: NEGATIVE

## 2024-03-23 LAB — HEPATITIS C ANTIBODY: Hepatitis C Ab: NONREACTIVE

## 2024-03-23 NOTE — Telephone Encounter (Unsigned)
 Copied from CRM [id number]. Topic: Clinical - Order For Equipment >> Mar 23, 2024  3:02 PM Mesmerise C wrote: Reason for CRM: Patient had an order for CPAP apenia mask she uses it at night for sleeping and needing approval to have it back

## 2024-03-25 ENCOUNTER — Telehealth: Payer: Self-pay | Admitting: Internal Medicine

## 2024-03-25 ENCOUNTER — Ambulatory Visit (INDEPENDENT_AMBULATORY_CARE_PROVIDER_SITE_OTHER): Admitting: Adult Health

## 2024-03-25 ENCOUNTER — Encounter: Payer: Self-pay | Admitting: Adult Health

## 2024-03-25 ENCOUNTER — Ambulatory Visit: Payer: Self-pay | Admitting: Internal Medicine

## 2024-03-25 VITALS — BP 134/76 | HR 58 | Ht 65.0 in | Wt 193.4 lb

## 2024-03-25 DIAGNOSIS — G4719 Other hypersomnia: Secondary | ICD-10-CM

## 2024-03-25 DIAGNOSIS — G4733 Obstructive sleep apnea (adult) (pediatric): Secondary | ICD-10-CM

## 2024-03-25 DIAGNOSIS — R0683 Snoring: Secondary | ICD-10-CM | POA: Diagnosis not present

## 2024-03-25 NOTE — Telephone Encounter (Signed)
 I tried to reach the pt and inform her of the following per her PCP Kitt needs to see Dr. Shellia who prescribed CPAP.  I will make a referral.

## 2024-03-25 NOTE — Progress Notes (Signed)
 @Patient  ID: Heidi Good, female    DOB: 05-05-1961, 63 y.o.   MRN: [id number]  Chief Complaint  Patient presents with   Consult    sleep    Referring provider: Plotnikov, Karlynn GAILS, MD  HPI: 63 yo female seen for sleep consult for snoring, restless sleep and daytime sleepiness.  Former patient here to reestablish, last seen in 2018.  Previously seen for mild intermittent asthma and allergies.  History of obstructive sleep apnea previously on CPAP Patient is Deaf-requires sign language interpretor    TEST/EVENTS : Reviewed 03/25/2024  Discussed the use of AI scribe software for clinical note transcription with the patient, who gave verbal consent to proceed.  History of Present Illness Heidi Good is a 63 year old female who presents with concerns of sleep apnea with loud snoring, gasping for air during her sleep, restless sleep and daytime sleepiness.  Electronic mobile sign language interpreter is present for today's visit  She reports loud snoring, which she can sometimes hear herself, and experiences occasional daytime sleepiness. She denies caffeine intake, stating she mostly drinks water and lemonade. She previously used a CPAP machine, but it was taken and she has not had one for a while.  She denies any history of stroke or congestive heart failure.  Typically goes to bed around 8 PM.  Gets up several times throughout the night.  Wakes up at 6:30 AM.  Has minimum caffeine intake..  No symptoms suspicious for cataplexy or sleep paralysis.  Patient is hearing impaired requires sign language interpreter.  We discussed setting up a sleep study at home but patient is very concerned that she would not be able to complete this by herself.  Her mother recently passed away who helped her at home and she no longer has any assistance.  Current weight is at 193 pounds with a BMI 32   Allergies  Allergen Reactions   Doxycycline  Nausea And Vomiting   Hydrocodone      Nausea  from cough syrup - ?? Very remote   Other     Dog, cat, nuts, grass, trees    Immunization History  Administered Date(s) Administered   Influenza Split 02/11/2011, 02/24/2012   Influenza, Seasonal, Injecte, Preservative Fre 02/20/2023, 01/22/2024   Influenza,inj,Quad PF,6+ Mos 02/16/2013, 01/26/2014, 01/25/2015, 01/16/2016, 02/03/2017, 02/02/2018, 01/14/2019, 01/21/2020, 01/16/2021, 02/19/2022   PFIZER(Purple Top)SARS-COV-2 Vaccination 08/20/2019, 09/13/2019   PNEUMOCOCCAL CONJUGATE-20 04/18/2021   Pneumococcal Conjugate-13 03/06/2016   Tdap 03/08/2015    Past Medical History:  Diagnosis Date   Anxiety    Constipation    Deafness    Depression    Diabetes mellitus (HCC)    Gastroparesis    Hyperlipemia    Hypertension    Hypothyroidism     Tobacco History: Social History   Tobacco Use  Smoking Status Never  Smokeless Tobacco Never   Counseling given: Not Answered   Outpatient Medications Prior to Visit  Medication Sig Dispense Refill   albuterol  (VENTOLIN  HFA) 108 (90 Base) MCG/ACT inhaler Inhale 2 puffs into the lungs every 4 (four) hours as needed for wheezing or shortness of breath. 8 g 5   AMBULATORY NON FORMULARY MEDICATION Nitroglycerine ointment 0.125 %  Apply a pea sized amount internally four times daily. Dispense 30 GM 1 refill 30 g 1   ARIPiprazole  (ABILIFY ) 15 MG tablet Take 1 tablet (15 mg total) by mouth daily. 90 tablet 2   clotrimazole -betamethasone  (LOTRISONE ) cream Apply 1 Application topically daily. 30 g 0   levothyroxine  (  SYNTHROID ) 75 MCG tablet TAKE 1 TABLET(75 MCG) BY MOUTH DAILY 90 tablet 3   metoprolol  succinate (TOPROL  XL) 25 MG 24 hr tablet Take 1 tablet (25 mg total) by mouth at bedtime. 90 tablet 3   potassium chloride  (KLOR-CON ) 8 MEQ tablet TAKE 1 TABLET(8 MEQ) BY MOUTH DAILY 90 tablet 3   famotidine  (PEPCID ) 40 MG tablet Take 1 tablet (40 mg total) by mouth daily. (Patient not taking: Reported on 03/25/2024) 90 tablet 3   No  facility-administered medications prior to visit.     Review of Systems:   Constitutional:   No  weight loss, night sweats,  Fevers, chills, +fatigue, or  lassitude.  HEENT:   No headaches,  Difficulty swallowing,  Tooth/dental problems, or  Sore throat,                No sneezing, itching, ear ache, nasal congestion, post nasal drip,   CV:  No chest pain,  Orthopnea, PND, swelling in lower extremities, anasarca, dizziness, palpitations, syncope.   GI  No heartburn, indigestion, abdominal pain, nausea, vomiting, diarrhea, change in bowel habits, loss of appetite, bloody stools.   Resp: No shortness of breath with exertion or at rest.  No excess mucus, no productive cough,  No non-productive cough,  No coughing up of blood.  No change in color of mucus.  No wheezing.  No chest wall deformity  Skin: no rash or lesions.  GU: no dysuria, change in color of urine, no urgency or frequency.  No flank pain, no hematuria   MS:  No joint pain or swelling.  No decreased range of motion.  No back pain.    Physical Exam  BP 134/76   Pulse (!) 58   Ht 5' 5 (1.651 m) Comment: RA  Wt 193 lb 6.4 oz (87.7 kg)   SpO2 94% Comment: RA  BMI 32.18 kg/m   GEN: A/Ox3; pleasant , NAD, well nourished    HEENT:  [place]/AT,  NOSE-clear, THROAT-clear, no lesions, no postnasal drip or exudate noted.  Class III NP airway  NECK:  Supple w/ fair ROM; no JVD; normal carotid impulses w/o bruits; no thyromegaly or nodules palpated; no lymphadenopathy.    RESP  Clear  P & A; w/o, wheezes/ rales/ or rhonchi. no accessory muscle use, no dullness to percussion  CARD:  RRR, no m/r/g, no peripheral edema, pulses intact, no cyanosis or clubbing.  GI:   Soft & nt; nml bowel sounds; no organomegaly or masses detected.   Musco: Warm bil, no deformities or joint swelling noted.   Neuro: alert, no focal deficits noted.    Skin: Warm, no lesions or rashes    Lab Results:Reviewed 03/25/2024   CBC    Component  Value Date/Time   WBC 11.1 (H) 03/22/2024 1500   RBC 4.93 03/22/2024 1500   HGB 14.8 03/22/2024 1500   HCT 43.3 03/22/2024 1500   PLT 139.0 (L) 03/22/2024 1500   MCV 87.9 03/22/2024 1500   MCHC 34.1 03/22/2024 1500   RDW 13.0 03/22/2024 1500   LYMPHSABS 3.4 03/22/2024 1500   MONOABS 0.7 03/22/2024 1500   EOSABS 0.2 03/22/2024 1500   BASOSABS 0.1 03/22/2024 1500    BMET    Component Value Date/Time   NA 137 03/22/2024 1500   K 3.6 03/22/2024 1500   CL 99 03/22/2024 1500   CO2 27 03/22/2024 1500   GLUCOSE 89 03/22/2024 1500   BUN 9 03/22/2024 1500   CREATININE 0.67 03/22/2024 1500  CALCIUM 9.4 03/22/2024 1500   GFRNONAA 95 07/11/2008 1616   GFRAA 115 07/11/2008 1616    BNP No results found for: BNP  ProBNP No results found for: PROBNP  Imaging: No results found.  Administration History     None         Lab Results  Component Value Date   NITRICOXIDE 14 05/01/2017       03/25/2024   11:00 AM 03/11/2018    2:00 PM  Results of the [facility] flowsheet  Sitting and reading 2 3  Watching TV 1 3  Sitting, inactive in a public place (e.g. a theatre or a meeting) 3 0  As a passenger in a car for an hour without a break 0 0  Lying down to rest in the afternoon when circumstances permit 1 0  Sitting and talking to someone 0 0  Sitting quietly after a lunch without alcohol 0 0  In a car, while stopped for a few minutes in traffic 0 0  Total score 7 6        Assessment & Plan:   Assessment and Plan Assessment & Plan Suspected obstructive sleep apnea with snoring, gasping for air during her sleep and daytime sleepiness She reports loud snoring and occasional daytime sleepiness, indicating possible obstructive sleep apnea. There is no history of stroke or congestive heart failure. She prefers an in-lab sleep study due to concerns about completing a home study independently. An in-lab sleep study is ordered at [facility]. A  nurse will call her brother to explain the process per her request.  A follow-up appointment is scheduled in six weeks to review the sleep study results.  - discussed how weight can impact sleep and risk for sleep disordered breathing - discussed options to assist with weight loss: combination of diet modification, cardiovascular and strength training exercises   - had an extensive discussion regarding the adverse health consequences related to untreated sleep disordered breathing - specifically discussed the risks for hypertension, coronary artery disease, cardiac dysrhythmias, cerebrovascular disease, and diabetes - lifestyle modification discussed   - discussed how sleep disruption can increase risk of accidents, particularly when driving - safe driving practices were discussed      Morbid obesity BMI 32.  Patient is encouraged on healthy weight loss      Brekyn Huntoon, NP 03/25/2024  I spent  45  minutes dedicated to the care of this patient on the date of this encounter to include pre-visit review of records, face-to-face time with the patient discussing conditions above, post visit ordering of testing, clinical documentation with the electronic health record, making appropriate referrals as documented, and communicating necessary findings to members of the patients care team.

## 2024-03-25 NOTE — Telephone Encounter (Signed)
 Heidi Good needs to see Dr. Shellia who prescribed CPAP.  I will make a referral.

## 2024-03-25 NOTE — Patient Instructions (Addendum)
 Set up for sleep study at sleep lab  Work on healthy sleep regimen  Work on healthy weight .  Follow up in 6 weeks to review results.

## 2024-04-05 ENCOUNTER — Ambulatory Visit: Admitting: Family Medicine

## 2024-04-05 ENCOUNTER — Encounter: Payer: Self-pay | Admitting: Family Medicine

## 2024-04-05 VITALS — BP 118/74 | HR 58 | Temp 97.6°F | Ht 65.0 in | Wt 192.0 lb

## 2024-04-05 DIAGNOSIS — R001 Bradycardia, unspecified: Secondary | ICD-10-CM | POA: Diagnosis not present

## 2024-04-05 DIAGNOSIS — M7062 Trochanteric bursitis, left hip: Secondary | ICD-10-CM | POA: Diagnosis not present

## 2024-04-05 MED ORDER — MELOXICAM 15 MG PO TABS: 15.0000 mg | ORAL_TABLET | Freq: Every day | ORAL | 0 refills | Status: DC

## 2024-04-05 MED ORDER — KETOROLAC TROMETHAMINE 60 MG/2ML IM SOLN
60.0000 mg | Freq: Once | INTRAMUSCULAR | Status: AC
  Administered 2024-04-05: 60 mg via INTRAMUSCULAR

## 2024-04-05 NOTE — Progress Notes (Signed)
 Subjective:     Patient ID: Heidi Good, female    DOB: 01-27-1961, 63 y.o.   MRN: [id number]  Chief Complaint  Patient presents with   Hip Pain    Last Friday pain on left hip when laying in bed. Took asprin and advil , hasn't helped. Would like hip injection    Hip Pain     Discussed the use of AI scribe software for clinical note transcription with the patient, who gave verbal consent to proceed.  History of Present Illness Heidi Good is a 63 year old female who presents with left hip pain.  Left hip pain - Duration of 4 days - Pain is significant enough to cause limping when standing - Pain is absent when sitting or lying down - Pain occurs with standing and walking, especially when rising from a seated position - Aspirin  did not provide relief - Received an injection for similar pain 2-3 years ago, which was effective for an extended period - No prior x-rays of the hip  Associated symptoms and exclusions - No numbness, tingling, or weakness in the left leg - No back pain - No fever or chills - No urinary symptoms     Health Maintenance Due  Topic Date Due   FOOT EXAM  Never done   Diabetic kidney evaluation - Urine ACR  Never done   Zoster Vaccines- Shingrix (1 of 2) Never done   COVID-19 Vaccine (3 - 2025-26 season) 01/12/2024    Past Medical History:  Diagnosis Date   Anxiety    Constipation    Deafness    Depression    Diabetes mellitus (HCC)    Gastroparesis    Hyperlipemia    Hypertension    Hypothyroidism     Past Surgical History:  Procedure Laterality Date   ABDOMINAL HYSTERECTOMY     ANAL RECTAL MANOMETRY N/A 03/07/2017   Procedure: ANO RECTAL MANOMETRY;  Surgeon: Shila Gustav GAILS, MD;  Location: [facility];  Service: Endoscopy;  Laterality: N/A;   BUNIONECTOMY Left     Family History  Problem Relation Age of Onset   Diabetes Mother    Colon polyps Mother        over 150 polyps detected   Heart disease Father     Multiple myeloma Father    Emphysema Father    Heart attack Father    Liver cancer Maternal Grandmother        not primary source, spread to her liver   Heart attack Maternal Grandfather    Emphysema Paternal Grandfather    Colon cancer Neg Hx    Esophageal cancer Neg Hx    Rectal cancer Neg Hx    Stomach cancer Neg Hx    BRCA 1/2 Neg Hx    Breast cancer Neg Hx     Social History   Socioeconomic History   Marital status: Divorced    Spouse name: Not on file   Number of children: 0   Years of education: Not on file   Highest education level: Not on file  Occupational History   Occupation: disablied    Employer: RETIRED  Tobacco Use   Smoking status: Never   Smokeless tobacco: Never  Vaping Use   Vaping status: Never Used  Substance and Sexual Activity   Alcohol use: Never   Drug use: Never   Sexual activity: Not Currently  Other Topics Concern   Not on file  Social History Narrative   Divorced   Social  Drivers of Corporate Investment Banker Strain: Low Risk  (02/19/2022)   Overall Financial Resource Strain (CARDIA)    Difficulty of Paying Living Expenses: Not hard at all  Food Insecurity: No Food Insecurity (03/22/2024)   Hunger Vital Sign    Worried About Running Out of Food in the Last Year: Never true    Ran Out of Food in the Last Year: Never true  Transportation Needs: No Transportation Needs (03/22/2024)   PRAPARE - Administrator, Civil Service (Medical): No    Lack of Transportation (Non-Medical): No  Physical Activity: Insufficiently Active (03/22/2024)   Exercise Vital Sign    Days of Exercise per Week: 3 days    Minutes of Exercise per Session: 30 min  Stress: No Stress Concern Present (03/22/2024)   [employer] of Occupational Health - Occupational Stress Questionnaire    Feeling of Stress: Not at all  Social Connections: Moderately Integrated (03/22/2024)   Social Connection and Isolation Panel    Frequency of Communication  with Friends and Family: More than three times a week    Frequency of Social Gatherings with Friends and Family: More than three times a week    Attends Religious Services: More than 4 times per year    Active Member of [employer] or Organizations: Yes    Attends Engineer, Structural: More than 4 times per year    Marital Status: Divorced  Intimate Partner Violence: Not At Risk (03/22/2024)   Humiliation, Afraid, Rape, and Kick questionnaire    Fear of Current or Ex-Partner: No    Emotionally Abused: No    Physically Abused: No    Sexually Abused: No    Outpatient Medications Prior to Visit  Medication Sig Dispense Refill   albuterol  (VENTOLIN  HFA) 108 (90 Base) MCG/ACT inhaler Inhale 2 puffs into the lungs every 4 (four) hours as needed for wheezing or shortness of breath. 8 g 5   AMBULATORY NON FORMULARY MEDICATION Nitroglycerine ointment 0.125 %  Apply a pea sized amount internally four times daily. Dispense 30 GM 1 refill 30 g 1   ARIPiprazole  (ABILIFY ) 15 MG tablet Take 1 tablet (15 mg total) by mouth daily. 90 tablet 2   clotrimazole -betamethasone  (LOTRISONE ) cream Apply 1 Application topically daily. 30 g 0   famotidine  (PEPCID ) 40 MG tablet Take 1 tablet (40 mg total) by mouth daily. 90 tablet 3   levothyroxine  (SYNTHROID ) 75 MCG tablet TAKE 1 TABLET(75 MCG) BY MOUTH DAILY 90 tablet 3   metoprolol  succinate (TOPROL  XL) 25 MG 24 hr tablet Take 1 tablet (25 mg total) by mouth at bedtime. 90 tablet 3   potassium chloride  (KLOR-CON ) 8 MEQ tablet TAKE 1 TABLET(8 MEQ) BY MOUTH DAILY 90 tablet 3   No facility-administered medications prior to visit.    Allergies  Allergen Reactions   Doxycycline  Nausea And Vomiting   Hydrocodone      Nausea from cough syrup - ?? Very remote   Other     Dog, cat, nuts, grass, trees    ROS Per HPI    Objective:    Physical Exam Constitutional:      General: She is not in acute distress.    Appearance: She is not ill-appearing.  Eyes:      Extraocular Movements: Extraocular movements intact.     Conjunctiva/sclera: Conjunctivae normal.  Cardiovascular:     Rate and Rhythm: Bradycardia present.     Comments: Baseline pulse  Pulmonary:     Effort: Pulmonary  effort is normal.  Musculoskeletal:     Cervical back: Normal range of motion and neck supple.     Lumbar back: Normal.     Left hip: Tenderness present. No crepitus. Normal range of motion. Normal strength.     Right lower leg: No edema.     Left lower leg: Normal. No edema.     Comments: TTP of left trochanteric bursa Pain with internal rotation and abduction   Skin:    General: Skin is warm and dry.  Neurological:     General: No focal deficit present.     Mental Status: She is alert and oriented to person, place, and time.     Cranial Nerves: No cranial nerve deficit.     Motor: No weakness.     Coordination: Coordination normal.     Gait: Gait normal.  Psychiatric:        Mood and Affect: Mood normal.        Behavior: Behavior normal.        Thought Content: Thought content normal.      BP 118/74   Pulse (!) 58   Temp 97.6 F (36.4 C) (Temporal)   Ht 5' 5 (1.651 m)   Wt 192 lb (87.1 kg)   SpO2 96%   BMI 31.95 kg/m  Wt Readings from Last 3 Encounters:  04/05/24 192 lb (87.1 kg)  03/25/24 193 lb 6.4 oz (87.7 kg)  03/22/24 192 lb 6.4 oz (87.3 kg)       Assessment & Plan:   Problem List Items Addressed This Visit     Trochanteric bursitis of left hip - Primary   Other Visit Diagnoses       Bradycardia           Assessment and Plan Assessment & Plan Trochanteric bursitis, left hip Acute left hip pain for four days, exacerbated by movement, with no pain at rest. No numbness, tingling, weakness, fever, chills, or urinary symptoms. Pain localized to the bursa sac, consistent with trochanteric bursitis. Previous relief with injection therapy. No prior hip x-ray. No injury reported.  - Administered Toradol  injection 60 mg IM left  buttock.  - Prescribed meloxicam  for 7-10 days, to be started the day after the injection. - Advised use of heating pad, acetaminophen , and topical lidocaine patches or Salonpas for additional pain management. - If pain persists after 10 days, will consider referral for hip x-ray or consultation with Dr. Plotnikoff for possible injection therapy.  Bradycardia - baseline per patient. Improved during visit. Asymptomatic.  - continue beta blocker    I am having Heidi Good start on meloxicam . I am also having her maintain her AMBULATORY NON FORMULARY MEDICATION, clotrimazole -betamethasone , famotidine , albuterol , ARIPiprazole , metoprolol  succinate, levothyroxine , and potassium chloride . We administered ketorolac .  Meds ordered this encounter  Medications   meloxicam  (MOBIC ) 15 MG tablet    Sig: Take 1 tablet (15 mg total) by mouth daily.    Dispense:  30 tablet    Refill:  0    Supervising Provider:   ROLLENE NORRIS A [[id number]]   ketorolac  (TORADOL ) injection 60 mg

## 2024-04-05 NOTE — Patient Instructions (Signed)
 We gave you a Toradol  60 mg IM injection in the office today.  This is an anti-inflammatory pain medicine.  If you need additional pain medication today, you may take over-the-counter Tylenol  500 mg or 1000 mg twice daily but avoid any other pain medications today.  Tomorrow you may start taking meloxicam  as prescribed with food.  Take this for the next 7 to 10 days approximately.  You can use over-the-counter topical medicine such as Salonpas with lidocaine or lidocaine patches if needed.  I also recommend using a heating pad and doing gentle stretching (see the handout)     Hip Bursitis  Hip bursitis is swelling of one or more fluid-filled sacs (bursae) in your hip joint. If the bursa becomes irritated, it can fill with extra fluid and become swollen. This condition can cause pain, and your symptoms may come and go over time. What are the causes? Repeated use of your hip muscles. Injury to the hip. Weak butt muscles. Bone spurs. Infection. In some cases, the cause may not be known. What increases the risk? Having a past hip injury or hip surgery. Having a condition, such as arthritis, gout, diabetes, or thyroid  disease. Having spine problems. Having one leg that is shorter than the other. Running a lot or doing long-distance running. Playing sports where there is a risk of injury or falling, such as football, martial arts, or skiing. What are the signs or symptoms? Symptoms may come and go, and they often include: Pain in the hip or groin area. Pain may get worse when you move your hip. Tenderness and swelling of the hip. In rare cases, the bursa may become infected. If this happens, you may: Get a fever. Have warmth and redness in the hip area. How is this treated? This condition is treated by: Resting your hip. Icing your hip. Wrapping the hip area with an elastic bandage (compression wrap). Keeping the hip raised. Other treatments may include: Using crutches, a cane,  or a walker. Medicines. Draining fluid out of the bursa. Surgery to take out a bursa. This is rare. Long-term treatment may include: Doing exercises to help your strength and flexibility. Lifestyle changes like losing weight to lessen the strain on your hip. Follow these instructions at home: Managing pain, stiffness, and swelling     If told, put ice on the painful area. To do this: Put ice in a plastic bag. Place a towel between your skin and the bag. Leave the ice on for 20 minutes, 2-3 times a day. Take off the ice if your skin turns bright red. This is very important. If you cannot feel pain, heat, or cold, you have a greater risk of damage to the area. Raise your hip by putting a pillow under your hips while you lie down. Stop if you feel pain. If told, put heat on the affected area. Do this as often as told by your doctor. Use the heat source that your doctor recommends, such as a moist heat pack or a heating pad. Place a towel between your skin and the heat source. Leave the heat on for 20-30 minutes. Take off the heat if your skin turns bright red. This is very important. If you cannot feel pain, heat, or cold, you have a greater risk of getting burned. Activity Do not use your hip to support your body weight until your doctor says that you can. Use crutches, a cane, or a walker as told by your doctor. If the affected leg  is one that you use to drive, ask your doctor if it is safe to drive. Rest and protect your hip as much as you can until you feel better. Return to your normal activities when your doctor says that it is safe. Do exercises as told by your doctor. General instructions Take over-the-counter and prescription medicines only as told by your doctor. Gently rub and stretch your injured area as often as is comfortable. Wear elastic bandages only as told by your doctor. If one of your legs is shorter than the other, get fitted for a shoe insert or orthotic. Keep a  healthy weight. Follow instructions from your doctor. Keep all follow-up visits. How is this prevented? Exercise regularly or as told by your doctor. Wear the right shoes for the sport you play and for daily activities. Warm up and stretch before being active. Cool down and stretch after being active. Take breaks often from repeated activity. Avoid activities that bother your hip or cause pain. Avoid sitting down for a long time. Where to find more information American Academy of Orthopaedic Surgeons: orthoinfo.aaos.org Contact a doctor if: You have a fever. You have new symptoms. You have trouble walking or doing everyday activities. You have pain that gets worse or does not get better with medicine. The skin around your hip is red. You get a feeling of warmth in your hip area. Get help right away if: You cannot move your hip. You have very bad pain. You cannot control the muscles in your feet. Summary Hip bursitis is swelling of one or more fluid-filled sacs (bursae) in your hip joint. Symptoms often come and go over time. This condition is often treated by resting and icing the hip. It also may help to keep the area raised and wrapped in an elastic bandage. Other treatments may be needed. This information is not intended to replace advice given to you by your health care provider. Make sure you discuss any questions you have with your health care provider. Document Revised: 04/24/2021 Document Reviewed: 04/24/2021 Elsevier Patient Education  2024 [employer].

## 2024-05-19 ENCOUNTER — Telehealth: Payer: Self-pay

## 2024-05-19 NOTE — Telephone Encounter (Signed)
 [facility] interpretor number on pt's contact list, [phone] but was unable to reach anyone or able to LVM. Pt will need to reschedule appointment on 05/20/24 with Madelin Stank, NP, as her sleep study will not be completed until 06/04/24.  Sending MyChart message

## 2024-05-20 ENCOUNTER — Ambulatory Visit: Admitting: Adult Health

## 2024-05-28 ENCOUNTER — Ambulatory Visit (INDEPENDENT_AMBULATORY_CARE_PROVIDER_SITE_OTHER): Admitting: Gastroenterology

## 2024-05-28 ENCOUNTER — Ambulatory Visit: Payer: Self-pay | Admitting: Gastroenterology

## 2024-05-28 ENCOUNTER — Encounter: Payer: Self-pay | Admitting: Gastroenterology

## 2024-05-28 ENCOUNTER — Other Ambulatory Visit

## 2024-05-28 VITALS — BP 128/80 | HR 56 | Ht 63.75 in | Wt 192.4 lb

## 2024-05-28 DIAGNOSIS — K6289 Other specified diseases of anus and rectum: Secondary | ICD-10-CM | POA: Diagnosis not present

## 2024-05-28 DIAGNOSIS — K602 Anal fissure, unspecified: Secondary | ICD-10-CM

## 2024-05-28 DIAGNOSIS — K5900 Constipation, unspecified: Secondary | ICD-10-CM

## 2024-05-28 DIAGNOSIS — K648 Other hemorrhoids: Secondary | ICD-10-CM

## 2024-05-28 DIAGNOSIS — K649 Unspecified hemorrhoids: Secondary | ICD-10-CM

## 2024-05-28 LAB — TSH: TSH: 1.09 u[IU]/mL (ref 0.35–5.50)

## 2024-05-28 MED ORDER — AMBULATORY NON FORMULARY MEDICATION: 0 refills | Status: AC

## 2024-05-28 MED ORDER — AMBULATORY NON FORMULARY MEDICATION: 0 refills | Status: DC

## 2024-05-28 NOTE — Progress Notes (Signed)
 "  Heidi Good [id number] [birth year]   Chief Complaint: Constipation and rectal pain  Referring Provider: Garald Karlynn GAILS, MD Primary GI MD: Dr. Legrand  HPI: Heidi Good is a 64 y.o. female with past medical history of anxiety/depression, constipation, deafness, diabetes, gastroparesis, HTN, hypothyroidism, HLD, hysterectomy who presents today for a complaint of constipation and rectal pain.    Patient last seen in office 11/14/2021 by Dr. Legrand for follow-up of anal fissure.  Noted to have had a fissure years ago and had extensive workup for constipation indicating dyssynergic defecation.  It was difficult for her to participate in biofeedback due to her hearing impairment.  She had reported good effect from Linzess  290 mcg daily.  Previous colonoscopy February 2018 with no polyps. At that visit anal fissure symptoms had significantly improved.  She was advised to decrease nitroglycerin to twice daily for 3 to 4 weeks, then discontinue, continue Linzess  for chronic constipation.  Due for repeat colonoscopy February 2028.  Labs 03/22/2024: WBC 11.1, normal hemoglobin 14.8, normal CMP, normal TSH   Discussed the use of AI scribe software for clinical note transcription with the patient, who gave verbal consent to proceed.  History of Present Illness Heidi Good is a 64 year old female with history of constipation and anal fissures who presents for evaluation of worsening constipation and rectal pain.  She is accompanied by a sign language interpreter today.  Constipation and Rectal Pain: - Daily constipation since May 13, 2024, with hard stools and persistent straining - Significant rectal pain described as hurts bad, with swelling and soreness in the rectal area - Frequent trips to the bathroom and difficulty passing stool - Sensation of a bulge at the rectum on self-examination - Suspects a rash in the rectal area - No current rectal bleeding, but prior episodes of  bleeding associated with burning when using Linzess  - No improvement in stool consistency with dietary modifications (soup and jello) - No use of nitroglycerin since 2023 - Was having regular bowel movements without constipation before January 2026 - No abdominal pain, nausea, vomiting, fever, or blood in the stool - No history of hemorrhoids  General Constitutional Symptoms: - Feels generally unwell and lousy  Cardiac Arrhythmia: - History of irregular heart rhythm, under care of a cardiologist - Takes Toprol  XL 25 mg daily since July 2025 - No chest pain or shortness of breath  Hypothyroidism: - History of hypothyroidism - Recent laboratory work reported as fine    Previous GI Procedures/Imaging   Colonoscopy 06/18/2016 - Diverticulosis in the sigmoid colon.  - The examination was otherwise normal on direct and retroflexion views.  - No specimens collected. - Recall 10 years  EGD 06/18/2016 - Non-specific esophageal mucosal changes. Biopsied to evaluate for possible for eosinophilic esophagitis.  - Gastritis.  - Normal examined duodenum.  - Several biopsies were obtained in the gastric body and in the gastric antrum.  Gastroparesis is still a possibility based on prior GES. May be related to underlying constipation as well.  Path: 1. Surgical [P], gastric antrum - CHRONIC FOCALLY ACTIVE GASTRITIS WITH HELICOBACTER PYLORI ORGANISMS. - THERE IS NO EVIDENCE OF DYSPLASIA OR MALIGNANCY. - SEE COMMENT. 2. Surgical [P], gastric body - CHRONIC FOCALLY ACTIVE GASTRITIS WITH HELICOBACTER PYLORI ORGANISMS. - THERE IS NO EVIDENCE OF DYSPLASIA OR MALIGNANCY. - SEE COMMENT. 3. Surgical [P], distal esophagus - DENSELY INFLAMED SQUAMOUS LINED MUCOSA WITH INCREASE IN EOSINOPHILS (UP TO 8 PER HIGH POWER FIELD). - THERE IS NO EVIDENCE  OF DYSPLASIA OR MALIGNANCY. - SEE COMMENT. 4. Surgical [P], mid esophagus - DENSELY INFLAMED SQUAMOUS LINED MUCOSA WITH INCREASED EOSINOPHILS (UP TO 12 PER HIGH  POWER FIELD). - THERE IS NO EVIDENCE OF DYSPLASIA OR MALIGNANCY. - SEE COMMENT.   Past Medical History:  Diagnosis Date   Anxiety    Constipation    Deafness    Depression    Diabetes mellitus (HCC)    Gastroparesis    Hyperlipemia    Hypertension    Hypothyroidism     Past Surgical History:  Procedure Laterality Date   ABDOMINAL HYSTERECTOMY     ANAL RECTAL MANOMETRY N/A 03/07/2017   Procedure: ANO RECTAL MANOMETRY;  Surgeon: Nandigam, Kavitha V, MD;  Location: [facility];  Service: Endoscopy;  Laterality: N/A;   BUNIONECTOMY Left     Current Outpatient Medications  Medication Sig Dispense Refill   albuterol  (VENTOLIN  HFA) 108 (90 Base) MCG/ACT inhaler Inhale 2 puffs into the lungs every 4 (four) hours as needed for wheezing or shortness of breath. 8 g 5   AMBULATORY NON FORMULARY MEDICATION Nitroglycerine ointment 0.125 %  Apply a pea sized amount internally four times daily. Dispense 30 GM 1 refill 30 g 1   ARIPiprazole  (ABILIFY ) 15 MG tablet Take 1 tablet (15 mg total) by mouth daily. 90 tablet 2   clotrimazole -betamethasone  (LOTRISONE ) cream Apply 1 Application topically daily. 30 g 0   famotidine  (PEPCID ) 40 MG tablet Take 1 tablet (40 mg total) by mouth daily. 90 tablet 3   levothyroxine  (SYNTHROID ) 75 MCG tablet TAKE 1 TABLET(75 MCG) BY MOUTH DAILY 90 tablet 3   metoprolol  succinate (TOPROL  XL) 25 MG 24 hr tablet Take 1 tablet (25 mg total) by mouth at bedtime. 90 tablet 3   potassium chloride  (KLOR-CON ) 8 MEQ tablet TAKE 1 TABLET(8 MEQ) BY MOUTH DAILY 90 tablet 3   No current facility-administered medications for this visit.    Allergies as of 05/28/2024 - Review Complete 05/28/2024  Allergen Reaction Noted   Doxycycline  Nausea And Vomiting 03/09/2013   Hydrocodone   04/03/2016   Other      Family History  Problem Relation Age of Onset   Diabetes Mother    Colon polyps Mother        over 150 polyps detected   Heart disease Father    Multiple myeloma  Father    Emphysema Father    Heart attack Father    Liver cancer Maternal Grandmother        not primary source, spread to her liver   Heart attack Maternal Grandfather    Emphysema Paternal Grandfather    Colon cancer Neg Hx    Esophageal cancer Neg Hx    Rectal cancer Neg Hx    Stomach cancer Neg Hx    BRCA 1/2 Neg Hx    Breast cancer Neg Hx     Social History[1]   Review of Systems:    Constitutional: No weight loss, fever, chills Cardiovascular: No chest pain Respiratory: No SOB  Gastrointestinal: See HPI and otherwise negative    Physical Exam:  Vital signs: BP 128/80 (BP Location: Left Arm, Patient Position: Sitting, Cuff Size: Normal)   Pulse (!) 56   Ht 5' 3.75 (1.619 m) Comment: height measured without shoes  Wt 192 lb 6 oz (87.3 kg)   BMI 33.28 kg/m   Wt Readings from Last 3 Encounters:  05/28/24 192 lb 6 oz (87.3 kg)  04/05/24 192 lb (87.1 kg)  03/25/24 193 lb 6.4 oz (  87.7 kg)     Constitutional: Pleasant, overweight female in NAD, alert and cooperative Head:  Normocephalic and atraumatic.  Respiratory: Respirations even and unlabored. Lungs clear to auscultation bilaterally.  No wheezes, crackles, or rhonchi.  Cardiovascular:  Irregular rhythm. No murmurs. No peripheral edema. Gastrointestinal:  Soft, nondistended, nontender. No rebound or guarding. Normal bowel sounds. No appreciable masses or hepatomegaly. Rectal: Posterior midline anal fissure which is tender to palpation, appears to have mildly prolapsed internal hemorrhoids as well, no visible bleeding, did not tolerate DRE. Chaperone present for exam. Neurologic:  Alert and oriented x4;  grossly normal neurologically.  Skin:   Dry and intact without significant lesions or rashes. Psychiatric: Oriented to person, place and time. Demonstrates good judgement and reason without abnormal affect or behaviors.  Echocardiogram 10/21/2022 1. Significant beat to beat variation due to frequent ectopy.  2.  Left ventricular ejection fraction, by estimation, is 55 to 60% . The left ventricle has normal function. The left ventricle has no regional wall motion abnormalities. Left ventricular diastolic parameters were normal.  3. Right ventricular systolic function is normal. The right ventricular size is normal.  4. The mitral valve is normal in structure. No evidence of mitral valve regurgitation. No evidence of mitral stenosis.  5. The aortic valve is normal in structure. Aortic valve regurgitation is not visualized. No aortic stenosis is present.  6. The inferior vena cava is normal in size with greater than 50% respiratory variability, suggesting right atrial pressure of 3 mmHg.   Assessment/Plan:   Assessment & Plan Constipation Rectal pain Anal fissure Hemorrhoids Daily constipation with rectal pain since January 2026. Prior to January was having regular bowel movements and denied any issue with constipation or fissures since she had been seen in 2023.  States she has not been taking Linzess  and that when she did take it it caused burning discomfort.  She has previously taken Metamucil with good results but has not been taking recently.  Has not been using anything for constipation, opting to wait and be seen here first.  Previously had good response to topical nitroglycerin for an anal fissure in 2023.  On exam does have a posterior midline anal fissure which is tender to palpation, possibly some mildly prolapsed internal hemorrhoids as well though patient did not tolerate further exam/DRE.  Will plan to treat her constipation and fissure and have her follow-up in the office in about 6 weeks to reevaluate, with consideration for colonoscopy if symptoms persist.  Will also repeat a TSH given her history of hypothyroidism, though she did have a low normal TSH in November.  - Will send prescription for topical nitroglycerin 0.125%, to be applied to rectum 3 times daily for 6 to 8 weeks - Will give  samples of RectiCare for patient to use 3 times daily as well, coupons given for her to pick up more OTC - Recommend MiraLAX 1 capful daily for management of constipation, with increased to twice daily dosing if needed - Recommend patient resume fiber supplementation (has previously used Metamucil) - Advised patient to let us  know if she continues to have problems with constipation despite fiber and MiraLAX, consider alternative treatment options (declines Linzess ) - Check a TSH today - Irregular heart rhythm on exam today, appears this has been previously evaluated, diagnosed with PVCs and is on metoprolol , advised patient to follow-up with cardiology - Follow-up in office in 6 weeks, consider colonoscopy if symptoms persist   Camie Furbish, PA-C [facility] 05/28/2024, 2:40 PM  Patient Care Team: Plotnikov, Karlynn GAILS, MD as PCP - General (Internal Medicine) Pietro Redell RAMAN, MD as PCP - Cardiology (Cardiology) Shellia Oh, MD as Consulting Physician (Pulmonary Disease) Cottle, Lorene KANDICE Raddle., MD as Attending Physician (Psychiatry) [facility] Octavia Bruckner, MD as Consulting Physician (Ophthalmology)       [1]  Social History Tobacco Use   Smoking status: Never   Smokeless tobacco: Never  Vaping Use   Vaping status: Never Used  Substance Use Topics   Alcohol use: Never   Drug use: Never   "

## 2024-05-28 NOTE — Patient Instructions (Addendum)
 Start Miralax 1 capful daily in 8 ounces of liquid. If constipation does not improve in a few days, take it twice a day.  Increase fiber in your diet. You can take Metamucil fiber supplement.  Use Recticare 3 times daily. You can also purchase it over the counter.  We have sent a prescription for nitroglycerin 0.125% gel to [facility]. You should apply a pea size amount to your rectum three times daily x 6-8 weeks.  [facility]'s information is below: Address: [address], [place], [place] [address]  [phone]  *Please DO NOT go directly from our office to pick up this medication! Give the pharmacy 1 day to process the prescription as this is compounded and takes time to make.  Follow up with Cardiologist regarding you irregular hear rhythm.  Please go to the lab in the basement of our building to have lab work done as you leave today. Hit B for basement when you get on the elevator.  When the doors open the lab is on your left.  We will call you with the results. Thank you.   _______________________________________________________  If your blood pressure at your visit was 140/90 or greater, please contact your primary care physician to follow up on this.  _______________________________________________________  If you are age 60 or older, your body mass index should be between 23-30. Your Body mass index is 33.28 kg/m. If this is out of the aforementioned range listed, please consider follow up with your Primary Care Provider.  If you are age 92 or younger, your body mass index should be between 19-25. Your Body mass index is 33.28 kg/m. If this is out of the aformentioned range listed, please consider follow up with your Primary Care Provider.   ________________________________________________________  The [facility] providers would like to encourage you to use MYCHART to communicate with providers for non-urgent requests or questions.  Due to long  hold times on the telephone, sending your provider a message by [facility] may be a faster and more efficient way to get a response.  Please allow 48 business hours for a response.  Please remember that this is for non-urgent requests.  _______________________________________________________  [facility] is using a team-based approach to care.  Your team is made up of your doctor and two to three APPS. Our APPS (Nurse Practitioners and Physician Assistants) work with your physician to ensure care continuity for you. They are fully qualified to address your health concerns and develop a treatment plan. They communicate directly with your gastroenterologist to care for you. Seeing the Advanced Practice Practitioners on your physician's team can help you by facilitating care more promptly, often allowing for earlier appointments, access to diagnostic testing, procedures, and other specialty referrals.   Due to recent changes in healthcare laws, you may see the results of your imaging and laboratory studies on MyChart before your provider has had a chance to review them.  We understand that in some cases there may be results that are confusing or concerning to you. Not all laboratory results come back in the same time frame and the provider may be waiting for multiple results in order to interpret others.  Please give us  48 hours in order for your provider to thoroughly review all the results before contacting the office for clarification of your results.

## 2024-05-31 ENCOUNTER — Telehealth: Payer: Self-pay | Admitting: Cardiology

## 2024-06-01 ENCOUNTER — Telehealth: Payer: Self-pay

## 2024-06-01 NOTE — Telephone Encounter (Signed)
 Copied from CRM [id number]. Topic: Appointments - Scheduling Inquiry for Clinic >> May 31, 2024 10:44 AM [name] wrote: Reason for CRM: Patient calling to cancel sleep study for 06/04/2024 due to chance for snow. Patient would like reschedule.  Routing to [facility] for rescheduling

## 2024-06-02 NOTE — Progress Notes (Signed)
 ____________________________________________________________  Attending physician addendum:  Thank you for sending this case to me. I have reviewed the entire note and agree with the plan.  Challenging situation and agree with the treatment plan.  We need to help her find a treatment regimen for constipation that will consistently work and be tolerated to try preventing a recurrent fissure.  Victory Brand, MD  ____________________________________________________________

## 2024-06-04 ENCOUNTER — Ambulatory Visit (HOSPITAL_BASED_OUTPATIENT_CLINIC_OR_DEPARTMENT_OTHER): Admitting: Pulmonary Disease

## 2024-06-28 ENCOUNTER — Ambulatory Visit: Payer: Self-pay | Admitting: Psychiatry

## 2024-07-01 ENCOUNTER — Ambulatory Visit: Admitting: Adult Health

## 2024-07-13 ENCOUNTER — Ambulatory Visit: Admitting: Gastroenterology

## 2024-07-27 ENCOUNTER — Ambulatory Visit: Admitting: Gastroenterology

## 2024-08-16 ENCOUNTER — Ambulatory Visit (HOSPITAL_BASED_OUTPATIENT_CLINIC_OR_DEPARTMENT_OTHER): Admitting: Pulmonary Disease

## 2024-09-22 ENCOUNTER — Ambulatory Visit: Admitting: Internal Medicine

## 2025-03-24 ENCOUNTER — Ambulatory Visit

## 2025-03-24 ENCOUNTER — Encounter: Admitting: Internal Medicine
